# Patient Record
Sex: Male | Born: 1935 | Race: White | Hispanic: No | Smoking: Former smoker
Health system: Southern US, Community
[De-identification: ages and names within clinical notes are randomized; demographics above are authoritative.]

## PROBLEM LIST (undated history)

## (undated) DIAGNOSIS — F329 Major depressive disorder, single episode, unspecified: Secondary | ICD-10-CM

## (undated) DIAGNOSIS — I10 Essential (primary) hypertension: Secondary | ICD-10-CM

## (undated) DIAGNOSIS — J45909 Unspecified asthma, uncomplicated: Secondary | ICD-10-CM

## (undated) DIAGNOSIS — G4730 Sleep apnea, unspecified: Secondary | ICD-10-CM

## (undated) DIAGNOSIS — G8929 Other chronic pain: Secondary | ICD-10-CM

## (undated) DIAGNOSIS — Z9989 Dependence on other enabling machines and devices: Secondary | ICD-10-CM

## (undated) DIAGNOSIS — M1990 Unspecified osteoarthritis, unspecified site: Secondary | ICD-10-CM

## (undated) DIAGNOSIS — E785 Hyperlipidemia, unspecified: Secondary | ICD-10-CM

## (undated) DIAGNOSIS — C4490 Unspecified malignant neoplasm of skin, unspecified: Secondary | ICD-10-CM

## (undated) DIAGNOSIS — G4733 Obstructive sleep apnea (adult) (pediatric): Secondary | ICD-10-CM

## (undated) DIAGNOSIS — K219 Gastro-esophageal reflux disease without esophagitis: Secondary | ICD-10-CM

## (undated) DIAGNOSIS — E039 Hypothyroidism, unspecified: Secondary | ICD-10-CM

## (undated) DIAGNOSIS — F32A Depression, unspecified: Secondary | ICD-10-CM

## (undated) DIAGNOSIS — M549 Dorsalgia, unspecified: Secondary | ICD-10-CM

## (undated) DIAGNOSIS — J449 Chronic obstructive pulmonary disease, unspecified: Secondary | ICD-10-CM

## (undated) HISTORY — DX: Depression, unspecified: F32.A

## (undated) HISTORY — DX: Major depressive disorder, single episode, unspecified: F32.9

## (undated) HISTORY — DX: Hypothyroidism, unspecified: E03.9

## (undated) HISTORY — PX: PROSTATE SURGERY: SHX751

## (undated) HISTORY — DX: Chronic obstructive pulmonary disease, unspecified: J44.9

## (undated) HISTORY — DX: Unspecified malignant neoplasm of skin, unspecified: C44.90

## (undated) HISTORY — PX: OTHER SURGICAL HISTORY: SHX169

## (undated) HISTORY — PX: NECK SURGERY: SHX720

## (undated) HISTORY — DX: Hyperlipidemia, unspecified: E78.5

## (undated) HISTORY — DX: Unspecified osteoarthritis, unspecified site: M19.90

## (undated) HISTORY — DX: Sleep apnea, unspecified: G47.30

## (undated) HISTORY — DX: Gastro-esophageal reflux disease without esophagitis: K21.9

## (undated) HISTORY — DX: Essential (primary) hypertension: I10

---

## 2011-07-23 HISTORY — PX: SKIN CANCER EXCISION: SHX779

## 2013-07-29 DIAGNOSIS — Z6836 Body mass index (BMI) 36.0-36.9, adult: Secondary | ICD-10-CM | POA: Diagnosis not present

## 2013-07-29 DIAGNOSIS — G894 Chronic pain syndrome: Secondary | ICD-10-CM | POA: Diagnosis not present

## 2013-07-29 DIAGNOSIS — M961 Postlaminectomy syndrome, not elsewhere classified: Secondary | ICD-10-CM | POA: Diagnosis not present

## 2013-07-29 DIAGNOSIS — M159 Polyosteoarthritis, unspecified: Secondary | ICD-10-CM | POA: Diagnosis not present

## 2013-07-29 DIAGNOSIS — I10 Essential (primary) hypertension: Secondary | ICD-10-CM | POA: Diagnosis not present

## 2013-07-29 DIAGNOSIS — M5137 Other intervertebral disc degeneration, lumbosacral region: Secondary | ICD-10-CM | POA: Diagnosis not present

## 2013-07-29 DIAGNOSIS — M47817 Spondylosis without myelopathy or radiculopathy, lumbosacral region: Secondary | ICD-10-CM | POA: Diagnosis not present

## 2013-08-25 DIAGNOSIS — E785 Hyperlipidemia, unspecified: Secondary | ICD-10-CM | POA: Diagnosis not present

## 2013-08-25 DIAGNOSIS — I10 Essential (primary) hypertension: Secondary | ICD-10-CM | POA: Diagnosis not present

## 2013-08-25 DIAGNOSIS — R9431 Abnormal electrocardiogram [ECG] [EKG]: Secondary | ICD-10-CM | POA: Diagnosis not present

## 2013-08-26 DIAGNOSIS — G894 Chronic pain syndrome: Secondary | ICD-10-CM | POA: Diagnosis not present

## 2013-08-26 DIAGNOSIS — Z6836 Body mass index (BMI) 36.0-36.9, adult: Secondary | ICD-10-CM | POA: Diagnosis not present

## 2013-08-26 DIAGNOSIS — M47817 Spondylosis without myelopathy or radiculopathy, lumbosacral region: Secondary | ICD-10-CM | POA: Diagnosis not present

## 2013-08-26 DIAGNOSIS — M5137 Other intervertebral disc degeneration, lumbosacral region: Secondary | ICD-10-CM | POA: Diagnosis not present

## 2013-08-26 DIAGNOSIS — M961 Postlaminectomy syndrome, not elsewhere classified: Secondary | ICD-10-CM | POA: Diagnosis not present

## 2013-08-27 DIAGNOSIS — I10 Essential (primary) hypertension: Secondary | ICD-10-CM | POA: Diagnosis not present

## 2013-09-07 DIAGNOSIS — J0100 Acute maxillary sinusitis, unspecified: Secondary | ICD-10-CM | POA: Diagnosis not present

## 2013-09-15 DIAGNOSIS — L821 Other seborrheic keratosis: Secondary | ICD-10-CM | POA: Diagnosis not present

## 2013-09-15 DIAGNOSIS — L905 Scar conditions and fibrosis of skin: Secondary | ICD-10-CM | POA: Diagnosis not present

## 2013-09-15 DIAGNOSIS — D235 Other benign neoplasm of skin of trunk: Secondary | ICD-10-CM | POA: Diagnosis not present

## 2013-09-15 DIAGNOSIS — D1801 Hemangioma of skin and subcutaneous tissue: Secondary | ICD-10-CM | POA: Diagnosis not present

## 2013-09-15 DIAGNOSIS — L570 Actinic keratosis: Secondary | ICD-10-CM | POA: Diagnosis not present

## 2013-09-23 DIAGNOSIS — M47817 Spondylosis without myelopathy or radiculopathy, lumbosacral region: Secondary | ICD-10-CM | POA: Diagnosis not present

## 2013-09-23 DIAGNOSIS — M961 Postlaminectomy syndrome, not elsewhere classified: Secondary | ICD-10-CM | POA: Diagnosis not present

## 2013-09-23 DIAGNOSIS — I10 Essential (primary) hypertension: Secondary | ICD-10-CM | POA: Diagnosis not present

## 2013-09-23 DIAGNOSIS — G894 Chronic pain syndrome: Secondary | ICD-10-CM | POA: Diagnosis not present

## 2013-09-23 DIAGNOSIS — Z6836 Body mass index (BMI) 36.0-36.9, adult: Secondary | ICD-10-CM | POA: Diagnosis not present

## 2013-09-23 DIAGNOSIS — M159 Polyosteoarthritis, unspecified: Secondary | ICD-10-CM | POA: Diagnosis not present

## 2013-09-23 DIAGNOSIS — M5137 Other intervertebral disc degeneration, lumbosacral region: Secondary | ICD-10-CM | POA: Diagnosis not present

## 2013-09-27 DIAGNOSIS — J449 Chronic obstructive pulmonary disease, unspecified: Secondary | ICD-10-CM | POA: Diagnosis not present

## 2013-09-27 DIAGNOSIS — F3289 Other specified depressive episodes: Secondary | ICD-10-CM | POA: Diagnosis not present

## 2013-10-15 DIAGNOSIS — G4733 Obstructive sleep apnea (adult) (pediatric): Secondary | ICD-10-CM | POA: Diagnosis not present

## 2013-10-15 DIAGNOSIS — J449 Chronic obstructive pulmonary disease, unspecified: Secondary | ICD-10-CM | POA: Diagnosis not present

## 2013-10-15 DIAGNOSIS — J309 Allergic rhinitis, unspecified: Secondary | ICD-10-CM | POA: Diagnosis not present

## 2013-10-21 DIAGNOSIS — I10 Essential (primary) hypertension: Secondary | ICD-10-CM | POA: Diagnosis not present

## 2013-10-21 DIAGNOSIS — M961 Postlaminectomy syndrome, not elsewhere classified: Secondary | ICD-10-CM | POA: Diagnosis not present

## 2013-10-21 DIAGNOSIS — M159 Polyosteoarthritis, unspecified: Secondary | ICD-10-CM | POA: Diagnosis not present

## 2013-10-21 DIAGNOSIS — M5137 Other intervertebral disc degeneration, lumbosacral region: Secondary | ICD-10-CM | POA: Diagnosis not present

## 2013-10-21 DIAGNOSIS — G894 Chronic pain syndrome: Secondary | ICD-10-CM | POA: Diagnosis not present

## 2013-10-21 DIAGNOSIS — M47817 Spondylosis without myelopathy or radiculopathy, lumbosacral region: Secondary | ICD-10-CM | POA: Diagnosis not present

## 2013-11-18 DIAGNOSIS — M159 Polyosteoarthritis, unspecified: Secondary | ICD-10-CM | POA: Diagnosis not present

## 2013-11-18 DIAGNOSIS — M5137 Other intervertebral disc degeneration, lumbosacral region: Secondary | ICD-10-CM | POA: Diagnosis not present

## 2013-11-18 DIAGNOSIS — I10 Essential (primary) hypertension: Secondary | ICD-10-CM | POA: Diagnosis not present

## 2013-11-18 DIAGNOSIS — G894 Chronic pain syndrome: Secondary | ICD-10-CM | POA: Diagnosis not present

## 2013-11-18 DIAGNOSIS — M47817 Spondylosis without myelopathy or radiculopathy, lumbosacral region: Secondary | ICD-10-CM | POA: Diagnosis not present

## 2013-11-18 DIAGNOSIS — Z6836 Body mass index (BMI) 36.0-36.9, adult: Secondary | ICD-10-CM | POA: Diagnosis not present

## 2013-11-18 DIAGNOSIS — M961 Postlaminectomy syndrome, not elsewhere classified: Secondary | ICD-10-CM | POA: Diagnosis not present

## 2013-11-29 DIAGNOSIS — Z79899 Other long term (current) drug therapy: Secondary | ICD-10-CM | POA: Diagnosis not present

## 2013-11-29 DIAGNOSIS — I10 Essential (primary) hypertension: Secondary | ICD-10-CM | POA: Diagnosis not present

## 2013-12-10 DIAGNOSIS — J449 Chronic obstructive pulmonary disease, unspecified: Secondary | ICD-10-CM | POA: Diagnosis not present

## 2013-12-10 DIAGNOSIS — G4733 Obstructive sleep apnea (adult) (pediatric): Secondary | ICD-10-CM | POA: Diagnosis not present

## 2013-12-10 DIAGNOSIS — J309 Allergic rhinitis, unspecified: Secondary | ICD-10-CM | POA: Diagnosis not present

## 2013-12-17 DIAGNOSIS — Z6836 Body mass index (BMI) 36.0-36.9, adult: Secondary | ICD-10-CM | POA: Diagnosis not present

## 2013-12-17 DIAGNOSIS — M159 Polyosteoarthritis, unspecified: Secondary | ICD-10-CM | POA: Diagnosis not present

## 2013-12-17 DIAGNOSIS — M47817 Spondylosis without myelopathy or radiculopathy, lumbosacral region: Secondary | ICD-10-CM | POA: Diagnosis not present

## 2013-12-17 DIAGNOSIS — G894 Chronic pain syndrome: Secondary | ICD-10-CM | POA: Diagnosis not present

## 2013-12-17 DIAGNOSIS — M5137 Other intervertebral disc degeneration, lumbosacral region: Secondary | ICD-10-CM | POA: Diagnosis not present

## 2013-12-17 DIAGNOSIS — I10 Essential (primary) hypertension: Secondary | ICD-10-CM | POA: Diagnosis not present

## 2013-12-17 DIAGNOSIS — M961 Postlaminectomy syndrome, not elsewhere classified: Secondary | ICD-10-CM | POA: Diagnosis not present

## 2013-12-27 DIAGNOSIS — R011 Cardiac murmur, unspecified: Secondary | ICD-10-CM | POA: Diagnosis not present

## 2013-12-27 DIAGNOSIS — E785 Hyperlipidemia, unspecified: Secondary | ICD-10-CM | POA: Diagnosis not present

## 2013-12-27 DIAGNOSIS — I119 Hypertensive heart disease without heart failure: Secondary | ICD-10-CM | POA: Diagnosis not present

## 2013-12-27 DIAGNOSIS — I498 Other specified cardiac arrhythmias: Secondary | ICD-10-CM | POA: Diagnosis not present

## 2014-02-10 DIAGNOSIS — M47817 Spondylosis without myelopathy or radiculopathy, lumbosacral region: Secondary | ICD-10-CM | POA: Diagnosis not present

## 2014-02-10 DIAGNOSIS — Z6836 Body mass index (BMI) 36.0-36.9, adult: Secondary | ICD-10-CM | POA: Diagnosis not present

## 2014-02-10 DIAGNOSIS — I10 Essential (primary) hypertension: Secondary | ICD-10-CM | POA: Diagnosis not present

## 2014-02-10 DIAGNOSIS — G894 Chronic pain syndrome: Secondary | ICD-10-CM | POA: Diagnosis not present

## 2014-02-10 DIAGNOSIS — M961 Postlaminectomy syndrome, not elsewhere classified: Secondary | ICD-10-CM | POA: Diagnosis not present

## 2014-02-10 DIAGNOSIS — M159 Polyosteoarthritis, unspecified: Secondary | ICD-10-CM | POA: Diagnosis not present

## 2014-02-10 DIAGNOSIS — M5137 Other intervertebral disc degeneration, lumbosacral region: Secondary | ICD-10-CM | POA: Diagnosis not present

## 2014-02-15 DIAGNOSIS — R58 Hemorrhage, not elsewhere classified: Secondary | ICD-10-CM | POA: Diagnosis not present

## 2014-02-15 DIAGNOSIS — D689 Coagulation defect, unspecified: Secondary | ICD-10-CM | POA: Diagnosis not present

## 2014-02-23 DIAGNOSIS — I10 Essential (primary) hypertension: Secondary | ICD-10-CM | POA: Diagnosis not present

## 2014-03-10 DIAGNOSIS — Z6836 Body mass index (BMI) 36.0-36.9, adult: Secondary | ICD-10-CM | POA: Diagnosis not present

## 2014-03-10 DIAGNOSIS — M5137 Other intervertebral disc degeneration, lumbosacral region: Secondary | ICD-10-CM | POA: Diagnosis not present

## 2014-03-10 DIAGNOSIS — M47817 Spondylosis without myelopathy or radiculopathy, lumbosacral region: Secondary | ICD-10-CM | POA: Diagnosis not present

## 2014-03-10 DIAGNOSIS — G894 Chronic pain syndrome: Secondary | ICD-10-CM | POA: Diagnosis not present

## 2014-03-10 DIAGNOSIS — M961 Postlaminectomy syndrome, not elsewhere classified: Secondary | ICD-10-CM | POA: Diagnosis not present

## 2014-03-10 DIAGNOSIS — I10 Essential (primary) hypertension: Secondary | ICD-10-CM | POA: Diagnosis not present

## 2014-03-10 DIAGNOSIS — M159 Polyosteoarthritis, unspecified: Secondary | ICD-10-CM | POA: Diagnosis not present

## 2014-03-16 DIAGNOSIS — D235 Other benign neoplasm of skin of trunk: Secondary | ICD-10-CM | POA: Diagnosis not present

## 2014-03-16 DIAGNOSIS — L819 Disorder of pigmentation, unspecified: Secondary | ICD-10-CM | POA: Diagnosis not present

## 2014-03-16 DIAGNOSIS — L821 Other seborrheic keratosis: Secondary | ICD-10-CM | POA: Diagnosis not present

## 2014-03-16 DIAGNOSIS — D1801 Hemangioma of skin and subcutaneous tissue: Secondary | ICD-10-CM | POA: Diagnosis not present

## 2014-04-07 DIAGNOSIS — M961 Postlaminectomy syndrome, not elsewhere classified: Secondary | ICD-10-CM | POA: Diagnosis not present

## 2014-04-07 DIAGNOSIS — M5137 Other intervertebral disc degeneration, lumbosacral region: Secondary | ICD-10-CM | POA: Diagnosis not present

## 2014-04-07 DIAGNOSIS — M47817 Spondylosis without myelopathy or radiculopathy, lumbosacral region: Secondary | ICD-10-CM | POA: Diagnosis not present

## 2014-04-07 DIAGNOSIS — G894 Chronic pain syndrome: Secondary | ICD-10-CM | POA: Diagnosis not present

## 2014-04-07 DIAGNOSIS — M159 Polyosteoarthritis, unspecified: Secondary | ICD-10-CM | POA: Diagnosis not present

## 2014-04-07 DIAGNOSIS — Z6836 Body mass index (BMI) 36.0-36.9, adult: Secondary | ICD-10-CM | POA: Diagnosis not present

## 2014-04-08 DIAGNOSIS — J209 Acute bronchitis, unspecified: Secondary | ICD-10-CM | POA: Diagnosis not present

## 2014-04-28 ENCOUNTER — Ambulatory Visit: Payer: Self-pay | Admitting: Internal Medicine

## 2014-05-31 ENCOUNTER — Encounter: Payer: Self-pay | Admitting: Internal Medicine

## 2014-05-31 ENCOUNTER — Ambulatory Visit (INDEPENDENT_AMBULATORY_CARE_PROVIDER_SITE_OTHER): Payer: Medicare Other | Admitting: Internal Medicine

## 2014-05-31 VITALS — BP 128/68 | HR 57 | Ht 68.0 in | Wt 228.2 lb

## 2014-05-31 DIAGNOSIS — K219 Gastro-esophageal reflux disease without esophagitis: Secondary | ICD-10-CM | POA: Diagnosis not present

## 2014-05-31 DIAGNOSIS — M1990 Unspecified osteoarthritis, unspecified site: Secondary | ICD-10-CM

## 2014-05-31 DIAGNOSIS — J438 Other emphysema: Secondary | ICD-10-CM | POA: Diagnosis not present

## 2014-05-31 DIAGNOSIS — I10 Essential (primary) hypertension: Secondary | ICD-10-CM | POA: Diagnosis not present

## 2014-05-31 DIAGNOSIS — E785 Hyperlipidemia, unspecified: Secondary | ICD-10-CM

## 2014-05-31 DIAGNOSIS — G4730 Sleep apnea, unspecified: Secondary | ICD-10-CM | POA: Diagnosis not present

## 2014-05-31 NOTE — Patient Instructions (Signed)
Your physician wants you to follow-up in: 6 months with Dr. Hilty. You will receive a reminder letter in the mail two months in advance. If you don't receive a letter, please call our office to schedule the follow-up appointment.    

## 2014-06-01 ENCOUNTER — Institutional Professional Consult (permissible substitution): Payer: Self-pay | Admitting: Internal Medicine

## 2014-06-02 ENCOUNTER — Encounter: Payer: Self-pay | Admitting: Internal Medicine

## 2014-06-02 DIAGNOSIS — I10 Essential (primary) hypertension: Secondary | ICD-10-CM | POA: Insufficient documentation

## 2014-06-02 DIAGNOSIS — E785 Hyperlipidemia, unspecified: Secondary | ICD-10-CM | POA: Insufficient documentation

## 2014-06-02 DIAGNOSIS — G4733 Obstructive sleep apnea (adult) (pediatric): Secondary | ICD-10-CM | POA: Insufficient documentation

## 2014-06-02 DIAGNOSIS — Z9989 Dependence on other enabling machines and devices: Secondary | ICD-10-CM

## 2014-06-02 DIAGNOSIS — K219 Gastro-esophageal reflux disease without esophagitis: Secondary | ICD-10-CM | POA: Insufficient documentation

## 2014-06-02 DIAGNOSIS — M1990 Unspecified osteoarthritis, unspecified site: Secondary | ICD-10-CM | POA: Insufficient documentation

## 2014-06-02 DIAGNOSIS — R0609 Other forms of dyspnea: Secondary | ICD-10-CM

## 2014-06-02 NOTE — Progress Notes (Signed)
OFFICE NOTE  Chief Complaint:  Establish new cardiologist  Primary Care Physician: No PCP Per Patient  HPI:  Cristian Yang [facility] Is a pleasant 79 year old male who is here today accompanied by his wife. They both moved to [place] recently from [place]. He is previously followed by a cardiologist there and a pulmonologist. He does have a significant smoking history of about 20 years of cigar smoking. He has COPD and obstructive sleep apnea as well as arthritis and a history of skin cancer. He has no history of known heart disease. He reports that last year he underwent heart catheterization in fact on the same day as his wife when she was reporting chest pain symptoms. This demonstrated mild, nonobstructive coronary disease by his report. We are requesting those records now. Currently denies any chest pain and does have mild shortness of breath with exertion.  PMHx:  Past Medical History  Diagnosis Date  . COPD (chronic obstructive pulmonary disease)   . Sleep apnea   . GERD (gastroesophageal reflux disease)   . Hyperlipidemia   . Arthritis   . Skin cancer   . Hypertension     Past Surgical History  Procedure Laterality Date  . Skin cancer excision  2013    FAMHx:  Family History  Problem Relation Age of Onset  . Diabetes Mother   . Heart disease Mother   . Cancer Brother   . Bone cancer Sister     SOCHx:   reports that he quit smoking about 20 years ago. His smoking use included Cigarettes. He has a 60 pack-year smoking history. He has never used smokeless tobacco. He reports that he does not drink alcohol or use illicit drugs.  ALLERGIES:  Allergies  Allergen Reactions  . Ciprofloxacin   . Codeine     ROS: A comprehensive review of systems was negative except for: Respiratory: positive for dyspnea on exertion  HOME MEDS: Current Outpatient Prescriptions  Medication Sig Dispense Refill  . albuterol (PROAIR HFA) 108 (90 BASE) MCG/ACT inhaler  Inhale 1 puff into the lungs 2 (two) times daily.    Marland Kitchen albuterol (PROVENTIL) (2.5 MG/3ML) 0.083% nebulizer solution Take 2.5 mg by nebulization 2 (two) times daily.    . cetirizine (ZYRTEC) 10 MG tablet Take 10 mg by mouth daily.    . citalopram (CELEXA) 20 MG tablet Take 20 mg by mouth daily.    Marland Kitchen docusate sodium (COLACE) 100 MG capsule Take 100 mg by mouth daily as needed for mild constipation.    Marland Kitchen HYDROmorphone (DILAUDID) 2 MG tablet Take 2 mg by mouth 2 (two) times daily.    . mometasone (NASONEX) 50 MCG/ACT nasal spray Place 2 sprays into the nose daily.    . pravastatin (PRAVACHOL) 20 MG tablet Take 20 mg by mouth daily.    Marland Kitchen tiotropium (SPIRIVA) 18 MCG inhalation capsule Place 18 mcg into inhaler and inhale daily.    . valsartan (DIOVAN) 160 MG tablet Take 160 mg by mouth daily.     No current facility-administered medications for this visit.    LABS/IMAGING: No results found for this or any previous visit (from the past 48 hour(s)). No results found.  VITALS: BP 128/68 mmHg  Pulse 57  Ht 5\' 8"  (1.727 m)  Wt 228 lb 3.2 oz (103.511 kg)  BMI 34.71 kg/m2  EXAM: General appearance: alert and no distress Neck: no carotid bruit and no JVD Lungs: diminished breath sounds bilaterally Heart: regular rate and rhythm, S1, S2  normal, no murmur, click, rub or gallop Abdomen: soft, non-tender; bowel sounds normal; no masses,  no organomegaly Extremities: extremities normal, atraumatic, no cyanosis or edema Pulses: 2+ and symmetric Skin: Skin color, texture, turgor normal. No rashes or lesions Neurologic: Grossly normal Psych: Normal  EKG: Sinus bradycardia at 57, no ischemic changes  ASSESSMENT: 1. COPD 2. Hypertension-controlled 3. Dyslipidemia on pravastatin 4. Recent heart catheterization which did not demonstrate obstructive coronary disease  PLAN: 1.   Cristian Yang has a number of cardiac risk factors although his main complaints are shortness of breath related to COPD. He  is going to establish care with Cristian Yang in pulmonary.  His blood pressure is well controlled and his cholesterol is controlled on a statin. We will request recent laboratory work from his primary care provider as well as a copy of his cardiac catheterization from his previous cardiologist in [place]. For now I would recommend continue his current medications and I can see him back with his wife in 6 months.  Pixie Casino, MD, [facility] Attending Cardiologist [facility]  Virgina Deakins C 06/02/2014, 10:27 AM

## 2014-06-07 DIAGNOSIS — I10 Essential (primary) hypertension: Secondary | ICD-10-CM | POA: Diagnosis not present

## 2014-06-07 DIAGNOSIS — J309 Allergic rhinitis, unspecified: Secondary | ICD-10-CM | POA: Diagnosis not present

## 2014-06-07 DIAGNOSIS — F321 Major depressive disorder, single episode, moderate: Secondary | ICD-10-CM | POA: Diagnosis not present

## 2014-06-07 DIAGNOSIS — G8929 Other chronic pain: Secondary | ICD-10-CM | POA: Diagnosis not present

## 2014-06-07 DIAGNOSIS — J449 Chronic obstructive pulmonary disease, unspecified: Secondary | ICD-10-CM | POA: Diagnosis not present

## 2014-06-07 DIAGNOSIS — E785 Hyperlipidemia, unspecified: Secondary | ICD-10-CM | POA: Diagnosis not present

## 2014-06-08 ENCOUNTER — Ambulatory Visit (INDEPENDENT_AMBULATORY_CARE_PROVIDER_SITE_OTHER): Payer: Medicare Other | Admitting: Internal Medicine

## 2014-06-08 ENCOUNTER — Encounter: Payer: Self-pay | Admitting: Internal Medicine

## 2014-06-08 ENCOUNTER — Ambulatory Visit (INDEPENDENT_AMBULATORY_CARE_PROVIDER_SITE_OTHER)
Admission: RE | Admit: 2014-06-08 | Discharge: 2014-06-08 | Disposition: A | Discharge status: Home / Self Care | Payer: Medicare Other | Source: Ambulatory Visit | Attending: Internal Medicine | Admitting: Internal Medicine

## 2014-06-08 VITALS — BP 126/80 | HR 63 | Ht 68.0 in | Wt 233.0 lb

## 2014-06-08 DIAGNOSIS — G4730 Sleep apnea, unspecified: Secondary | ICD-10-CM

## 2014-06-08 DIAGNOSIS — J449 Chronic obstructive pulmonary disease, unspecified: Secondary | ICD-10-CM

## 2014-06-08 DIAGNOSIS — R0602 Shortness of breath: Secondary | ICD-10-CM | POA: Diagnosis not present

## 2014-06-08 NOTE — Progress Notes (Addendum)
   Subjective:    Patient ID: Cristian Yang [facility], male    DOB: [birth year],    MRN: [id number]  HPI  82 yowm quit smoking around 2000 with doe then around 170 lb but since then gradually gained up 258 with dx of osa and progressive doe and started on meds around 2010 with advair and and spiriva and moved to [facility] Nov 2015 and self referred to pulmonary clinic 06/08/2014     06/08/2014 1st [facility] office visit/ Aayat Hajjar  On spiriva/ advair  Chief Complaint  Patient presents with  . Pulmonary Consult    Self referral. Pt states dxed with SOB approx 10 yrs ago. He gets SOB with carrying in the groceries "if I get in a rush".      Prev had proair but mostly relying on neb alb - told to take it every am and then prn during the day Thinks spiriva works better than advair by trial and error Assoc with watery pnds   No obvious other patterns in day to day or daytime variabilty or assoc chronic cough or cp or chest tightness, subjective wheeze or  overt  hb symptoms. No unusual exp hx or h/o childhood pna/ asthma or knowledge of premature birth.  Sleeping ok without nocturnal  or early am exacerbation  of respiratory  c/o's or need for noct saba. Also denies any obvious fluctuation of symptoms with weather or environmental changes or other aggravating or alleviating factors except as outlined above   Current Medications, Allergies, Complete Past Medical History, Past Surgical History, Family History, and Social History were reviewed in [employer] record.             Review of Systems  Constitutional: Negative for fever, chills, activity change, appetite change and unexpected weight change.  HENT: Positive for postnasal drip. Negative for congestion, dental problem, rhinorrhea, sneezing, sore throat, trouble swallowing and voice change.   Eyes: Negative for visual disturbance.  Respiratory: Positive for shortness of breath. Negative for cough and choking.     Cardiovascular: Negative for chest pain and leg swelling.  Gastrointestinal: Negative for nausea, vomiting and abdominal pain.  Genitourinary: Negative for difficulty urinating.  Musculoskeletal: Negative for arthralgias.  Skin: Negative for rash.  Psychiatric/Behavioral: Negative for behavioral problems and confusion.       Objective:   Physical Exam   amb wm nad   Wt Readings from Last 3 Encounters:  06/08/14 233 lb (105.688 kg)  05/31/14 228 lb 3.2 oz (103.511 kg)    Vital signs reviewed   HEENT mild turbinate edema.  Oropharynx no thrush or excess pnd or cobblestoning.  No JVD or cervical adenopathy. Mild accessory muscle hypertrophy. Trachea midline, nl thryroid. Chest was hyperinflated by percussion with diminished breath sounds and moderate increased exp time without wheeze. Hoover sign positive at mid inspiration. Regular rate and rhythm without murmur gallop or rub or increase P2 or edema.  Abd: no hsm, nl excursion. Ext warm without cyanosis or clubbing.       CXR  06/08/2014 :  No active cardiopulmonary disease.        Assessment & Plan:

## 2014-06-08 NOTE — Patient Instructions (Addendum)
Call the customer service number re your cpap and they either need to help you set it up or send it back to them and we'll regroup with you at your next visit with Dr Halford Chessman  Plan A = Automatic = advair and spiriva each am and advair again in evening  Plan B= Backup Only use your albuterol (proair)  as a rescue medication to be used if you can't catch your breath by resting or doing a relaxed purse lip breathing pattern.  - The less you use it, the better it will work when you need it. - Ok to use up to  every 4 hours if you must but call for immediate appointment if use goes up over your usual need - Don't leave home without it !!  (think of it like the spare tire for your car)   Please remember to go to the  x-ray department downstairs for your tests - we will call you with the results when they are available.   We will schedule you for pfts' on return visit to see Dr Halford Chessman Dec 17 - I will let him decide whether to see you for sleep and lung problems or just sleep

## 2014-06-09 NOTE — Assessment & Plan Note (Signed)
Has new machine but never set up, off cpap x 3 m s hypersomnolence and note has lost wt since peak of 258 so probably needs new titration study anyway  Already has appt with Dr Halford Chessman and rec he keep it and follow previous pulmonary recs in meantime (has customer service number on his device he should contact for interim instructions or send the device back and quit paying rent on it)

## 2014-06-09 NOTE — Assessment & Plan Note (Signed)
His condition seems mild/ moderate and probably mostly complicated by sign wt gain p quit smoking but remains ? Neb dep in am  DDX of  difficult airways management all start with A and  include Adherence, Ace Inhibitors, Acid Reflux, Active Sinus Disease, Alpha 1 Antitripsin deficiency, Anxiety masquerading as Airways dz,  ABPA,  allergy(esp in young), Aspiration (esp in elderly), Adverse effects of DPI,  Active smokers, plus two Bs  = Bronchiectasis and Beta blocker use..and one C= CHF  Adherence is always the initial "prime suspect" and is a multilayered concern that requires a "trust but verify" approach in every patient - starting with knowing how to use medications, especially inhalers, correctly, keeping up with refills and understanding the fundamental difference between maintenance and prns vs those medications only taken for a very short course and then stopped and not refilled.  - The proper method of use, as well as anticipated side effects, of a metered-dose inhaler are discussed and demonstrated to the patient. Improved effectiveness after extensive coaching during this visit to a level of approximately  90% with dpi so continue this but if can't eliminate am neb then change advair to breo on return or change from spiriva /advair to anoro or stiolto since mostly has emphysematous phenotype by hx

## 2014-06-09 NOTE — Progress Notes (Signed)
Quick Note:  Spoke with pt and notified of results per Dr. Wert. Pt verbalized understanding and denied any questions.  ______ 

## 2014-06-10 ENCOUNTER — Institutional Professional Consult (permissible substitution): Payer: Self-pay | Admitting: Internal Medicine

## 2014-06-23 ENCOUNTER — Telehealth: Payer: Self-pay | Admitting: Internal Medicine

## 2014-06-23 NOTE — Telephone Encounter (Signed)
Received records from [place].  These records were requested by Dr Debara Pickett.  Records given to [place], [place] for Dr Debara Pickett. lp

## 2014-07-04 DIAGNOSIS — M545 Low back pain: Secondary | ICD-10-CM | POA: Diagnosis not present

## 2014-07-04 DIAGNOSIS — G8929 Other chronic pain: Secondary | ICD-10-CM | POA: Diagnosis not present

## 2014-07-04 DIAGNOSIS — M546 Pain in thoracic spine: Secondary | ICD-10-CM | POA: Diagnosis not present

## 2014-07-04 DIAGNOSIS — M25561 Pain in right knee: Secondary | ICD-10-CM | POA: Diagnosis not present

## 2014-07-04 DIAGNOSIS — M5441 Lumbago with sciatica, right side: Secondary | ICD-10-CM | POA: Diagnosis not present

## 2014-07-07 ENCOUNTER — Telehealth: Payer: Self-pay | Admitting: Pulmonary Disease

## 2014-07-07 ENCOUNTER — Institutional Professional Consult (permissible substitution): Payer: Self-pay | Admitting: Pulmonary Disease

## 2014-07-07 ENCOUNTER — Ambulatory Visit: Payer: Medicare Other | Attending: Anesthesiology

## 2014-07-07 DIAGNOSIS — M545 Low back pain, unspecified: Secondary | ICD-10-CM

## 2014-07-07 DIAGNOSIS — R531 Weakness: Secondary | ICD-10-CM | POA: Diagnosis not present

## 2014-07-07 DIAGNOSIS — M2560 Stiffness of unspecified joint, not elsewhere classified: Secondary | ICD-10-CM | POA: Diagnosis not present

## 2014-07-07 NOTE — Telephone Encounter (Signed)
Pt wants to switch providers from [facility] for pulmonary care.  Pt is requesting to see the same doctor as as his wife.  Pt has appt in Feb. 2016 to establish sleep care with Dr. Halford Chessman.  Dr. Halford Chessman and Dr Melvyn Novas please advise if you are ok with this switch for both husband and wife.  Thank you.

## 2014-07-07 NOTE — Telephone Encounter (Signed)
Okay with me also 

## 2014-07-07 NOTE — Therapy (Addendum)
Outpatient Rehabilitation [facility] [address] [place], [place], [address] Phone: [phone]   Fax:  [phone]  Physical Therapy Evaluation  Patient Details  Name: Rilen Shukla [facility] MRN: [id number] Date of Birth: 10-Dec-1934  Encounter Date: 07/07/2014      PT End of Session - 07/07/14 1352    Visit Number 1   Number of Visits 12   Date for PT Re-Evaluation 08/07/14   PT Start Time 1310   PT Stop Time 1400   PT Time Calculation (min) 50 min   Activity Tolerance Patient tolerated treatment well;Patient limited by pain   Behavior During Therapy [facility] for tasks assessed/performed      Past Medical History  Diagnosis Date  . COPD (chronic obstructive pulmonary disease)   . Sleep apnea   . GERD (gastroesophageal reflux disease)   . Hyperlipidemia   . Arthritis   . Skin cancer   . Hypertension     Past Surgical History  Procedure Laterality Date  . Skin cancer excision  2013    There were no vitals taken for this visit.  Visit Diagnosis:  Bilateral low back pain without sciatica - Plan: PT plan of care cert/re-cert  Stiffness of joints, multiple sites - Plan: PT plan of care cert/re-cert  Weakness - Plan: PT plan of care cert/re-cert      Subjective Assessment - 07/07/14 1315    Symptoms Lower back pain  worse RT more than LT. He leans to Lt for better releif.    He says he will do a HEP if issued   Pertinent History Pain onset without injury. He has seen pain management for 3 years. He has had PT in past. He reports MD in past suggested a fusion. PT declined   How long can you sit comfortably? 60 minor more   How long can you stand comfortably? Worse on hard surfaces. Also knee and ankle pain. 60 min max   How long can you walk comfortably? 5 minutes   Diagnostic tests MRI to be done in future   Patient Stated Goals None as MD wanted PT.           [facility] PT Assessment - 07/07/14 1340    Assessment   Medical Diagnosis lumbago   Onset Date  --  3-4 years ago   Next MD Visit 07/18/14   Precautions   Precautions None   Restrictions   Weight Bearing Restrictions No   Balance Screen   Has the patient fallen in the past 6 months Yes   How many times? 1  "blacked out"   Has the patient had a decrease in activity level because of a fear of falling?  No   Is the patient reluctant to leave their home because of a fear of falling?  No   Prior Function   Level of Independence Independent with basic ADLs  Assists with wifes care   AROM   Lumbar Flexion He can touch his knees  he can touch his knees   Lumbar Extension Decreased 80%   Lumbar - Right Side Bend decr 100%    Lumbar - Left Side Bend decr 80%   Lumbar - Right Rotation decr 75%   Lumbar - Left Rotation decr 75%   Strength   Overall Strength --  Both LE 4+/5 with pain all joints. Poor abdominals   Ambulation/Gait   Ambulation/Gait --  Independent with lateral shift to Lt and wider BOS.  PT Education - 07/07/14 1429    Education provided Yes   Education Details Discussed plan and likely hood of limited benefit but he wanted to try   Person(s) Educated Patient;Spouse   Methods Explanation   Comprehension Verbalized understanding            PT Long Term Goals - 07/07/14 1430    PT LONG TERM GOAL #1   Title He will be independent with HEP issued as of last visit   Time 4   Period Weeks   Status New   PT LONG TERM GOAL #2   Title He will report pain decreased 25% with normal home tasks   PT LONG TERM GOAL #3   Title He will understand posture and mechanics to ease stress to spine   Time 4   Period Weeks   Status New          Plan - 07/07/14 1352    Clinical Impression Statement Significant stiffens  in trunk and pan levels   Pt will benefit from skilled therapeutic intervention in order to improve on the following deficits Difficulty walking;Decreased range of motion;Decreased activity tolerance;Pain;Decreased strength   Rehab  Potential Fair   PT Frequency 2x / week   PT Duration 4 weeks  But if no better at 6 visists will discharge   PT Treatment/Interventions Moist Heat;Electrical Stimulation;Ultrasound;Patient/family education;Manual techniques;Dry needling;Functional mobility training;Therapeutic exercise;Passive range of motion   PT Next Visit Plan Start HEP, Modalities   PT Home Exercise Plan Trunk stretching   Recommended Other Services None   Consulted and Agree with Plan of Care Patient                              Problem List Patient Active Problem List   Diagnosis Date Noted  . COPD pfts pending  06/02/2014  . Sleep apnea 06/02/2014  . GERD (gastroesophageal reflux disease) 06/02/2014  . Hyperlipidemia 06/02/2014  . Arthritis 06/02/2014  . Hypertension 06/02/2014    Darrel Hoover PT 07/07/2014, 2:42 PM      PHYSICAL THERAPY DISCHARGE SUMMARY  Visits from Start of Care: Eval only  Current functional level related to goals / functional outcomes: Unknown. I thought he and his wife were to transfer to [facility] clinic but do not know if they did.   Remaining deficits: Unknown   Education / Equipment: None Plan:                                                    Patient goals were not met. Patient is being discharged due to not returning since the last visit.  ?????   Lillette Boxer Yaretzi Ernandez. PT          09/20/14   10:03 AM

## 2014-07-07 NOTE — Telephone Encounter (Signed)
LMTCB

## 2014-07-07 NOTE — Telephone Encounter (Signed)
Fine with me

## 2014-07-08 NOTE — Telephone Encounter (Signed)
Spoke with pt and notified this was okay per VS  Per Ashytn pt can keep appt for sleep cons that is already scheduled and will address pulm issues at that time

## 2014-07-25 DIAGNOSIS — M79609 Pain in unspecified limb: Secondary | ICD-10-CM | POA: Diagnosis not present

## 2014-07-25 DIAGNOSIS — M25519 Pain in unspecified shoulder: Secondary | ICD-10-CM | POA: Diagnosis not present

## 2014-07-25 DIAGNOSIS — G8929 Other chronic pain: Secondary | ICD-10-CM | POA: Diagnosis not present

## 2014-07-25 DIAGNOSIS — M4327 Fusion of spine, lumbosacral region: Secondary | ICD-10-CM | POA: Diagnosis not present

## 2014-07-25 DIAGNOSIS — M545 Low back pain: Secondary | ICD-10-CM | POA: Diagnosis not present

## 2014-08-05 DIAGNOSIS — J449 Chronic obstructive pulmonary disease, unspecified: Secondary | ICD-10-CM | POA: Diagnosis not present

## 2014-08-05 DIAGNOSIS — E039 Hypothyroidism, unspecified: Secondary | ICD-10-CM | POA: Diagnosis not present

## 2014-08-05 DIAGNOSIS — Z23 Encounter for immunization: Secondary | ICD-10-CM | POA: Diagnosis not present

## 2014-08-05 DIAGNOSIS — Z85820 Personal history of malignant melanoma of skin: Secondary | ICD-10-CM | POA: Diagnosis not present

## 2014-08-05 DIAGNOSIS — I10 Essential (primary) hypertension: Secondary | ICD-10-CM | POA: Diagnosis not present

## 2014-08-05 DIAGNOSIS — N400 Enlarged prostate without lower urinary tract symptoms: Secondary | ICD-10-CM | POA: Diagnosis not present

## 2014-08-05 DIAGNOSIS — G8929 Other chronic pain: Secondary | ICD-10-CM | POA: Diagnosis not present

## 2014-08-05 DIAGNOSIS — E785 Hyperlipidemia, unspecified: Secondary | ICD-10-CM | POA: Diagnosis not present

## 2014-08-05 DIAGNOSIS — F329 Major depressive disorder, single episode, unspecified: Secondary | ICD-10-CM | POA: Diagnosis not present

## 2014-08-19 ENCOUNTER — Ambulatory Visit: Payer: Medicare Other | Admitting: Urology

## 2014-08-24 ENCOUNTER — Institutional Professional Consult (permissible substitution): Payer: Medicare Other | Admitting: Pulmonary Disease

## 2014-08-25 DIAGNOSIS — M79604 Pain in right leg: Secondary | ICD-10-CM | POA: Diagnosis not present

## 2014-08-25 DIAGNOSIS — G8929 Other chronic pain: Secondary | ICD-10-CM | POA: Diagnosis not present

## 2014-08-25 DIAGNOSIS — M545 Low back pain: Secondary | ICD-10-CM | POA: Diagnosis not present

## 2014-08-25 DIAGNOSIS — M79605 Pain in left leg: Secondary | ICD-10-CM | POA: Diagnosis not present

## 2014-08-31 ENCOUNTER — Emergency Department (HOSPITAL_COMMUNITY): Payer: Medicare Other

## 2014-08-31 ENCOUNTER — Encounter (HOSPITAL_COMMUNITY): Payer: Self-pay

## 2014-08-31 ENCOUNTER — Emergency Department (HOSPITAL_COMMUNITY)
Admission: EM | Admit: 2014-08-31 | Discharge: 2014-08-31 | Disposition: A | Discharge status: Home / Self Care | Payer: Medicare Other | Attending: Emergency Medicine | Admitting: Emergency Medicine

## 2014-08-31 DIAGNOSIS — I10 Essential (primary) hypertension: Secondary | ICD-10-CM | POA: Insufficient documentation

## 2014-08-31 DIAGNOSIS — Z87891 Personal history of nicotine dependence: Secondary | ICD-10-CM | POA: Diagnosis not present

## 2014-08-31 DIAGNOSIS — Z8719 Personal history of other diseases of the digestive system: Secondary | ICD-10-CM | POA: Insufficient documentation

## 2014-08-31 DIAGNOSIS — Z85828 Personal history of other malignant neoplasm of skin: Secondary | ICD-10-CM | POA: Diagnosis not present

## 2014-08-31 DIAGNOSIS — Z7951 Long term (current) use of inhaled steroids: Secondary | ICD-10-CM | POA: Insufficient documentation

## 2014-08-31 DIAGNOSIS — E785 Hyperlipidemia, unspecified: Secondary | ICD-10-CM | POA: Diagnosis not present

## 2014-08-31 DIAGNOSIS — M25511 Pain in right shoulder: Secondary | ICD-10-CM | POA: Insufficient documentation

## 2014-08-31 DIAGNOSIS — Z79899 Other long term (current) drug therapy: Secondary | ICD-10-CM | POA: Diagnosis not present

## 2014-08-31 DIAGNOSIS — Z791 Long term (current) use of non-steroidal anti-inflammatories (NSAID): Secondary | ICD-10-CM | POA: Diagnosis not present

## 2014-08-31 DIAGNOSIS — M19011 Primary osteoarthritis, right shoulder: Secondary | ICD-10-CM | POA: Diagnosis not present

## 2014-08-31 DIAGNOSIS — J449 Chronic obstructive pulmonary disease, unspecified: Secondary | ICD-10-CM | POA: Insufficient documentation

## 2014-08-31 MED ORDER — NAPROXEN 500 MG PO TABS: 500.0000 mg | ORAL_TABLET | Freq: Two times a day (BID) | ORAL | Status: DC

## 2014-08-31 NOTE — ED Provider Notes (Signed)
Medical screening examination/treatment/procedure(s) were conducted as a shared visit with non-physician practitioner(s) and myself.  I personally evaluated the patient during the encounter.   EKG Interpretation None     No results found for this or any previous visit. Dg Shoulder Right  08/31/2014   CLINICAL DATA:  Right shoulder pain for 2 days.  No known injury.  EXAM: RIGHT SHOULDER - 2+ VIEW  COMPARISON:  None.  FINDINGS: There is no acute bony or joint abnormality. Moderate acromioclavicular osteoarthritis is noted. Visualized right lung and ribs are unremarkable.  IMPRESSION: No acute abnormality.  Moderate acromioclavicular osteoarthritis.   Electronically Signed   By: Inge Rise M.D.   On: 08/31/2014 14:15    Patient seen by me. Patient with complaint of right shoulder pain mostly posterior for 2 days. No specific injury. But he does frequently left a 3 wheel scooter in and out of the [place] for his wife. Patient also has an area of abrasion or maybe scratch marks on the posterior part of the shoulder that could be something he bumped up against. Patient with reasonable range of motion. Radial pulses 2+. There is some pain with range of motion. Possibility of rotator cuff injury or perhaps the arthritis that's evident on his x-ray could be playing a role. No bony injury. No evidence of any rash or anything consistent with shingles at this time.  Fredia Sorrow, MD 08/31/14 1438

## 2014-08-31 NOTE — Discharge Instructions (Signed)
Arthritis, Nonspecific °Arthritis is inflammation of a joint. This usually means pain, redness, warmth or swelling are present. One or more joints may be involved. There are a number of types of arthritis. Your caregiver may not be able to tell what type of arthritis you have right away. °CAUSES  °The most common cause of arthritis is the wear and tear on the joint (osteoarthritis). This causes damage to the cartilage, which can break down over time. The knees, hips, back and neck are most often affected by this type of arthritis. °Other types of arthritis and common causes of joint pain include: °· Sprains and other injuries near the joint. Sometimes minor sprains and injuries cause pain and swelling that develop hours later. °· Rheumatoid arthritis. This affects hands, feet and knees. It usually affects both sides of your body at the same time. It is often associated with chronic ailments, fever, weight loss and general weakness. °· Crystal arthritis. Gout and pseudo gout can cause occasional acute severe pain, redness and swelling in the foot, ankle, or knee. °· Infectious arthritis. Bacteria can get into a joint through a break in overlying skin. This can cause infection of the joint. Bacteria and viruses can also spread through the blood and affect your joints. °· Drug, infectious and allergy reactions. Sometimes joints can become mildly painful and slightly swollen with these types of illnesses. °SYMPTOMS  °· Pain is the main symptom. °· Your joint or joints can also be red, swollen and warm or hot to the touch. °· You may have a fever with certain types of arthritis, or even feel overall ill. °· The joint with arthritis will hurt with movement. Stiffness is present with some types of arthritis. °DIAGNOSIS  °Your caregiver will suspect arthritis based on your description of your symptoms and on your exam. Testing may be needed to find the type of arthritis: °· Blood and sometimes urine tests. °· X-ray tests  and sometimes CT or MRI scans. °· Removal of fluid from the joint (arthrocentesis) is done to check for bacteria, crystals or other causes. Your caregiver (or a specialist) will numb the area over the joint with a local anesthetic, and use a needle to remove joint fluid for examination. This procedure is only minimally uncomfortable. °· Even with these tests, your caregiver may not be able to tell what kind of arthritis you have. Consultation with a specialist (rheumatologist) may be helpful. °TREATMENT  °Your caregiver will discuss with you treatment specific to your type of arthritis. If the specific type cannot be determined, then the following general recommendations may apply. °Treatment of severe joint pain includes: °· Rest. °· Elevation. °· Anti-inflammatory medication (for example, ibuprofen) may be prescribed. Avoiding activities that cause increased pain. °· Only take over-the-counter or prescription medicines for pain and discomfort as recommended by your caregiver. °· Cold packs over an inflamed joint may be used for 10 to 15 minutes every hour. Hot packs sometimes feel better, but do not use overnight. Do not use hot packs if you are diabetic without your caregiver's permission. °· A cortisone shot into arthritic joints may help reduce pain and swelling. °· Any acute arthritis that gets worse over the next 1 to 2 days needs to be looked at to be sure there is no joint infection. °Long-term arthritis treatment involves modifying activities and lifestyle to reduce joint stress jarring. This can include weight loss. Also, exercise is needed to nourish the joint cartilage and remove waste. This helps keep the muscles   around the joint strong. °HOME CARE INSTRUCTIONS  °· Do not take aspirin to relieve pain if gout is suspected. This elevates uric acid levels. °· Only take over-the-counter or prescription medicines for pain, discomfort or fever as directed by your caregiver. °· Rest the joint as much as  possible. °· If your joint is swollen, keep it elevated. °· Use crutches if the painful joint is in your leg. °· Drinking plenty of fluids may help for certain types of arthritis. °· Follow your caregiver's dietary instructions. °· Try low-impact exercise such as: °¨ Swimming. °¨ Water aerobics. °¨ Biking. °¨ Walking. °· Morning stiffness is often relieved by a warm shower. °· Put your joints through regular range-of-motion. °SEEK MEDICAL CARE IF:  °· You do not feel better in 24 hours or are getting worse. °· You have side effects to medications, or are not getting better with treatment. °SEEK IMMEDIATE MEDICAL CARE IF:  °· You have a fever. °· You develop severe joint pain, swelling or redness. °· Many joints are involved and become painful and swollen. °· There is severe back pain and/or leg weakness. °· You have loss of bowel or bladder control. °Document Released: 08/15/2004 Document Revised: 09/30/2011 Document Reviewed: 08/31/2008 °Exit[employer]® Patient Information ©2015 [employer]. This information is not intended to replace advice given to you by your health care provider. Make sure you discuss any questions you have with your health care provider. ° °

## 2014-08-31 NOTE — ED Notes (Signed)
Patient c/o of new onset right shoulder pain times two days. Denies injury, no hx of shoulder problems. Patient SOB in triage, patient states he has chronic hx of SOB and this is "like always". RR 24 SPO2 96% RA.

## 2014-08-31 NOTE — ED Notes (Signed)
PA at bedside.

## 2014-09-08 NOTE — ED Provider Notes (Signed)
CSN: [id number]     Arrival date & time 08/31/14  1232 History   First MD Initiated Contact with Patient 08/31/14 1307     Chief Complaint  Patient presents with  . Shoulder Pain     (Consider location/radiation/quality/duration/timing/severity/associated sxs/prior Treatment) HPI  YONAS BUNDA is a 80 y.o. male who presents to the Emergency Department complaining of right shoulder pain for two days.  He describes an aching pain from his shoulder blade to the front.  He denies known injury, but does admit to having to lift his wife's motorized scooter in and out of their van.  He also reports having "scratches or bruises" to the back of his armpit but does not recall itching or injury.  He has tried OTC creams without relief.  He denies neck pain, numbness or weakness, chest pain, sweats, nausea or jaw pain.     Past Medical History  Diagnosis Date  . COPD (chronic obstructive pulmonary disease)   . Sleep apnea   . GERD (gastroesophageal reflux disease)   . Hyperlipidemia   . Arthritis   . Skin cancer   . Hypertension    Past Surgical History  Procedure Laterality Date  . Skin cancer excision  2013   Family History  Problem Relation Age of Onset  . Diabetes Mother   . Heart disease Mother   . Cancer Brother   . Bone cancer Sister    History  Substance Use Topics  . Smoking status: Former Smoker -- 2.00 packs/day for 30 years    Types: Cigarettes    Quit date: 05/31/1994  . Smokeless tobacco: Never Used  . Alcohol Use: No    Review of Systems  Constitutional: Negative for fever and chills.  Respiratory: Negative for chest tightness, shortness of breath and wheezing.   Cardiovascular: Negative for chest pain.  Gastrointestinal: Negative for nausea and vomiting.  Genitourinary: Negative for dysuria and difficulty urinating.  Musculoskeletal: Positive for arthralgias (right shoulder pain). Negative for joint swelling, neck pain and neck stiffness.  Skin: Negative for  color change and wound.  Neurological: Negative for dizziness, weakness, numbness and headaches.  All other systems reviewed and are negative.     Allergies  Ciprofloxacin and Codeine  Home Medications   Prior to Admission medications   Medication Sig Start Date End Date Taking? Authorizing Provider  albuterol (PROVENTIL) (2.5 MG/3ML) 0.083% nebulizer solution Take 2.5 mg by nebulization 2 (two) times daily as needed.     Historical Provider, MD  cetirizine (ZYRTEC) 10 MG tablet Take 10 mg by mouth daily.    Historical Provider, MD  citalopram (CELEXA) 20 MG tablet Take 20 mg by mouth daily.    Historical Provider, MD  docusate sodium (COLACE) 100 MG capsule Take 100 mg by mouth daily as needed for mild constipation.    Historical Provider, MD  Fluticasone-Salmeterol (ADVAIR) 250-50 MCG/DOSE AEPB Inhale 1 puff into the lungs 2 (two) times daily.    Historical Provider, MD  HYDROmorphone (DILAUDID) 2 MG tablet Take 2 mg by mouth 2 (two) times daily.    Historical Provider, MD  mometasone (NASONEX) 50 MCG/ACT nasal spray Place 2 sprays into the nose daily.    Historical Provider, MD  naproxen (NAPROSYN) 500 MG tablet Take 1 tablet (500 mg total) by mouth 2 (two) times daily with a meal. 08/31/14   Aloysious Vangieson L. Graycen Degan, PA-C  pravastatin (PRAVACHOL) 20 MG tablet Take 20 mg by mouth daily.    Historical Provider, MD  tiotropium (  SPIRIVA) 18 MCG inhalation capsule Place 18 mcg into inhaler and inhale daily.    Historical Provider, MD  valsartan (DIOVAN) 160 MG tablet Take 160 mg by mouth daily.    Historical Provider, MD   BP 156/73 mmHg  Pulse 61  Temp(Src) 98 F (36.7 C) (Oral)  Resp 16  Ht 5\' 8"  (1.727 m)  Wt 226 lb (102.513 kg)  BMI 34.37 kg/m2  SpO2 94% Physical Exam  Constitutional: He is oriented to person, place, and time. He appears well-developed and well-nourished. No distress.  HENT:  Head: Normocephalic and atraumatic.  Neck: Normal range of motion. Neck supple. No  thyromegaly present.  Cardiovascular: Normal rate, regular rhythm, normal heart sounds and intact distal pulses.   No murmur heard. Pulmonary/Chest: Effort normal and breath sounds normal. No respiratory distress. He exhibits no tenderness.  Musculoskeletal: He exhibits tenderness. He exhibits no edema.  Pt has mild tenderness with full abduction of the right shoulder, but otherwise has full ROM. Mild tenderness also present at [facility] joint. Radial pulse is brisk, distal sensation intact, CR< 2 sec. Grip strength is strong and symmetrical.   No edema , erythema or step-off deformity of the joint.   Lymphadenopathy:    He has no cervical adenopathy.  Neurological: He is alert and oriented to person, place, and time. He has normal strength. No sensory deficit. He exhibits normal muscle tone. Coordination normal.  Skin: Skin is warm and dry.  4-5 linear areas of ecchymosis to the posterior axilla.  No edema.  No rash  Nursing note and vitals reviewed.   ED Course  Procedures (including critical care time) Labs Review Labs Reviewed - No data to display  Imaging Review Dg Shoulder Right  08/31/2014   CLINICAL DATA:  Right shoulder pain for 2 days.  No known injury.  EXAM: RIGHT SHOULDER - 2+ VIEW  COMPARISON:  None.  FINDINGS: There is no acute bony or joint abnormality. Moderate acromioclavicular osteoarthritis is noted. Visualized right lung and ribs are unremarkable.  IMPRESSION: No acute abnormality.  Moderate acromioclavicular osteoarthritis.   Electronically Signed   By: Inge Rise M.D.   On: 08/31/2014 14:15      EKG Interpretation None      MDM   Final diagnoses:  Shoulder pain, acute, right    Pt is well appearing.  Vitals stable.  Pain to right shoulder without known injury.  Pain reproduced with movement.  No rash present to indicate shingles at this time.  Clinical suspicion for cardiac process is low.  Patient also seen and care discussed with Dr. Rogene Houston  Will treat  with NSAID, heat and he agrees to close PMD f/u or to return here if sx's worsen.     Omaya Nieland L. Vanessa [place], PA-C 09/08/14 1336

## 2014-09-09 ENCOUNTER — Institutional Professional Consult (permissible substitution): Payer: Medicare Other | Admitting: Internal Medicine

## 2014-09-16 ENCOUNTER — Telehealth: Payer: Self-pay | Admitting: Pulmonary Disease

## 2014-09-16 NOTE — Telephone Encounter (Signed)
Spoke with pt's wife.  Apparent there was power surge at their home, and since then her husband's CPAP is not working correctly.  She had called his DME, but missed their call back.  She was seen by Dr. Melvyn Novas previously, but requested to change providers.  He has several appointments that were cancelled due to his wife being in the hospital >> she has bad lung disease and is on oxygen 24/7.  He has appt scheduled with Dr. Gwenette Greet in March.  Advised her that I can't address mechanical problems with his CPAP and he will need to discuss with his DME to assess.

## 2014-09-22 DIAGNOSIS — M545 Low back pain: Secondary | ICD-10-CM | POA: Diagnosis not present

## 2014-09-22 DIAGNOSIS — G8929 Other chronic pain: Secondary | ICD-10-CM | POA: Diagnosis not present

## 2014-09-30 DIAGNOSIS — D1801 Hemangioma of skin and subcutaneous tissue: Secondary | ICD-10-CM | POA: Diagnosis not present

## 2014-09-30 DIAGNOSIS — Z85820 Personal history of malignant melanoma of skin: Secondary | ICD-10-CM | POA: Diagnosis not present

## 2014-09-30 DIAGNOSIS — L738 Other specified follicular disorders: Secondary | ICD-10-CM | POA: Diagnosis not present

## 2014-09-30 DIAGNOSIS — L820 Inflamed seborrheic keratosis: Secondary | ICD-10-CM | POA: Diagnosis not present

## 2014-09-30 DIAGNOSIS — L821 Other seborrheic keratosis: Secondary | ICD-10-CM | POA: Diagnosis not present

## 2014-10-10 ENCOUNTER — Encounter: Payer: Self-pay | Admitting: Pulmonary Disease

## 2014-10-10 ENCOUNTER — Ambulatory Visit (INDEPENDENT_AMBULATORY_CARE_PROVIDER_SITE_OTHER): Payer: Medicare Other | Admitting: Pulmonary Disease

## 2014-10-10 VITALS — BP 154/78 | HR 65 | Temp 97.8°F | Ht 68.0 in | Wt 230.4 lb

## 2014-10-10 DIAGNOSIS — G4733 Obstructive sleep apnea (adult) (pediatric): Secondary | ICD-10-CM | POA: Diagnosis not present

## 2014-10-10 NOTE — Patient Instructions (Signed)
Will get you referred to an equipment company here in town for new supplies, and also to increase pressure and download your machine Work on weight loss followup with me again in 85mos.

## 2014-10-10 NOTE — Assessment & Plan Note (Signed)
The patient has a history of mild sleep apnea dating back to 2004, and has been on C Pap since that time. He is set on a very low pressure, and he sometimes feels it is not adequate. Despite this, he feels that he clearly sleeps better with the C Pap device, and his wife sleeps better because it eliminates his snoring and restlessness. He feels that he is adequately rested in the mornings and has no issues with sleepiness during the day. At this point, I would like to get him referred to a medical equipment company here in town for new supplies, and would also like to get a download off his machine and try him on a little higher pressure.

## 2014-10-10 NOTE — Progress Notes (Signed)
Subjective:    Patient ID: Cristian Yang, male    DOB: [birth year], 80 y.o.   MRN: [id number]  HPI The patient is a 80 year old male who comes in today for establishing care for management of sleep apnea. He was diagnosed in [place] in 2004 with mild OSA, with an AHI of 12 events per hour. Dawn C at 4 cm of water, and feels that he is clearly slept better while wearing the device. Sometimes he feels the pressure is not adequate, but he has tolerated this over the years. He feels that he is rested in the mornings upon arising, and denies any sleepiness during the day. His weight has increased 10-15 pounds over the last few years, and his Epworth score today is 3. He has a brand-new C Pap device that is less than a year old, and currently uses a nasal mask. He has not been able to use a full face mask.   Sleep Questionnaire What time do you typically go to bed?( Between what hours) 11p 11p at 1459 on 10/10/14 by Inge Rise, CMA How long does it take you to fall asleep? 1/2 hrs 1/2 hrs at 1459 on 10/10/14 by Inge Rise, CMA How many times during the night do you wake up? 2 2 at 1459 on 10/10/14 by Inge Rise, [place] What time do you get out of bed to start your day? 0600 0600 at 1459 on 10/10/14 by Inge Rise, CMA Do you drive or operate heavy machinery in your occupation? No No at 1459 on 10/10/14 by Inge Rise, CMA How much has your weight changed (up or down) over the past two years? (In pounds) 10 lb (4.536 kg) 10 lb (4.536 kg) at 1459 on 10/10/14 by Inge Rise, CMA Have you ever had a sleep study before? Yes Yes at 1459 on 10/10/14 by Inge Rise, CMA If yes, [place] 1459 on 10/10/14 by Inge Rise, CMA If yes, date of study? 2014 2014 at 1459 on 10/10/14 by Inge Rise, CMA Do you currently use CPAP? Yes Yes at 1459 on 10/10/14 by Inge Rise, CMA If so, what pressure? not sure not sure at 1459 on 10/10/14 by Inge Rise, CMA Do you wear oxygen at any time? No No at 1459 on 10/10/14 by Inge Rise, CMA   Review of Systems  Constitutional: Negative for fever, chills, activity change, appetite change and unexpected weight change.  HENT: Negative for congestion, dental problem, postnasal drip, rhinorrhea, sneezing, sore throat, trouble swallowing and voice change.   Eyes: Negative for visual disturbance.  Respiratory: Negative for cough, choking and shortness of breath.   Cardiovascular: Negative for chest pain and leg swelling.  Gastrointestinal: Negative for nausea, vomiting and abdominal pain.  Genitourinary: Negative for difficulty urinating.  Musculoskeletal: Negative for arthralgias.  Skin: Negative for rash.  Psychiatric/Behavioral: Negative for behavioral problems and confusion.       Objective:   Physical Exam Constitutional:  Overweight male, no acute distress  HENT:  Nares patent without discharge  Oropharynx without exudate, palate and uvula are thick and elongated.  Eyes:  Perrla, eomi, no scleral icterus  Neck:  No JVD, no TMG  Cardiovascular:  Normal rate, regular rhythm, no rubs or gallops.  2/6 sem        Intact distal pulses  Pulmonary :  Normal breath sounds, no stridor or respiratory distress   No rales, rhonchi,  or wheezing  Abdominal:  Soft, nondistended, bowel sounds present.  No tenderness noted.   Musculoskeletal:  Minimal  lower extremity edema noted.  Lymph Nodes:  No cervical lymphadenopathy noted  Skin:  No cyanosis noted  Neurologic:  Alert, appropriate, moves all 4 extremities without obvious deficit.         Assessment & Plan:

## 2014-10-17 ENCOUNTER — Institutional Professional Consult (permissible substitution): Payer: Medicare Other | Admitting: Pulmonary Disease

## 2014-10-21 DIAGNOSIS — M546 Pain in thoracic spine: Secondary | ICD-10-CM | POA: Diagnosis not present

## 2014-10-21 DIAGNOSIS — G8929 Other chronic pain: Secondary | ICD-10-CM | POA: Diagnosis not present

## 2014-10-21 DIAGNOSIS — M545 Low back pain: Secondary | ICD-10-CM | POA: Diagnosis not present

## 2014-10-25 DIAGNOSIS — F4320 Adjustment disorder, unspecified: Secondary | ICD-10-CM | POA: Diagnosis not present

## 2014-11-03 ENCOUNTER — Ambulatory Visit: Payer: Medicare Other | Attending: Family Medicine | Admitting: Rehabilitative and Restorative Service Providers"

## 2014-11-03 DIAGNOSIS — R531 Weakness: Secondary | ICD-10-CM | POA: Insufficient documentation

## 2014-11-03 DIAGNOSIS — M2560 Stiffness of unspecified joint, not elsewhere classified: Secondary | ICD-10-CM | POA: Insufficient documentation

## 2014-11-03 DIAGNOSIS — M545 Low back pain: Secondary | ICD-10-CM | POA: Insufficient documentation

## 2014-11-03 DIAGNOSIS — J441 Chronic obstructive pulmonary disease with (acute) exacerbation: Secondary | ICD-10-CM

## 2014-11-03 NOTE — Therapy (Signed)
[facility] [address] [place], [place], [address] Phone: [phone]   Fax:  [phone]  Patient Details  Name: Cristian Yang MRN: [id number] Date of Birth: [birth year] Referring Provider:  London Pepper, MD  Encounter Date: 11/03/2014  The patient arrived today for a wheelchair evaluation due to COPD and general mobility issues (including chronic R ankle and chronic low back pain).  He reports he is seeking w/c mobility mainly for outdoor surfaces and for when he is fatigued.  At today's evaluation, he was able to stand and walk independently without an assistive device.  He denies falls in the past 12 months.  He ambulates >4 minutes nonstop (over 526ft) before O2 decreased from 96% to 88%.  He rested and it returned to 95% within <1 minute of resting.     PT and [employer] (w/c vendor from [facility]) explained to patient that [employer] covers w/c for use for household distances and household mobility.  At this time, he is independent for household mobility with occasional rest breaks when fatigued.    Therefore, the patient does not qualify for power mobility at this time due to independence with ambulation at the household level.     [place], [place] 11/03/2014, 11:21 AM  [facility] [address] [place], [place], [address] Phone: [phone]   Fax:  [phone]

## 2014-11-04 DIAGNOSIS — G8929 Other chronic pain: Secondary | ICD-10-CM | POA: Diagnosis not present

## 2014-11-04 DIAGNOSIS — M545 Low back pain: Secondary | ICD-10-CM | POA: Diagnosis not present

## 2014-11-08 ENCOUNTER — Ambulatory Visit (INDEPENDENT_AMBULATORY_CARE_PROVIDER_SITE_OTHER): Payer: Medicare Other | Admitting: Pulmonary Disease

## 2014-11-08 ENCOUNTER — Encounter: Payer: Self-pay | Admitting: Pulmonary Disease

## 2014-11-08 ENCOUNTER — Other Ambulatory Visit: Payer: Self-pay | Admitting: Pulmonary Disease

## 2014-11-08 DIAGNOSIS — R0609 Other forms of dyspnea: Principal | ICD-10-CM

## 2014-11-08 NOTE — Progress Notes (Signed)
   Subjective:    Patient ID: Cristian Yang, male    DOB: 11/25/1934, 80 y.o.   MRN: [id number]  HPI The patient is a 80 year old male who I have been asked to see for management of COPD. The patient tells me that he was diagnosed in [place] with COPD, and has had PFTs in the past. He is currently on Advair and Spiriva, and feels that he is doing very well on these medications. He describes a dyspnea on exertion at one block at a moderate pace on flat ground, and will also get winded walking up a flight of stairs or bringing groceries in from the car. Despite this, he feels that his breathing is at his usual baseline, and has not had a recent acute exacerbation. He denies any chronic cough or mucus production. He did attend pulmonary rehabilitation while in [place], and did not feel that it helped him. He has had a recent chest x-ray in November of last year that showed no acute process. We also have an old echo on him from 2008 that showed an ejection fraction of 50%, global mild hypokinesis, and mild left ventricular dilatation.   Review of Systems  Constitutional: Negative for fever and unexpected weight change.  HENT: Positive for congestion and ear pain. Negative for dental problem, nosebleeds, postnasal drip, rhinorrhea, sinus pressure, sneezing, sore throat and trouble swallowing.   Eyes: Negative for redness and itching.  Respiratory: Positive for cough, shortness of breath and wheezing. Negative for chest tightness.   Cardiovascular: Negative for palpitations and leg swelling.  Gastrointestinal: Negative for nausea and vomiting.  Genitourinary: Negative for dysuria.  Musculoskeletal: Negative for joint swelling.  Skin: Negative for rash.  Neurological: Positive for headaches.  Hematological: Does not bruise/bleed easily.  Psychiatric/Behavioral: Positive for dysphoric mood. The patient is nervous/anxious.        Objective:   Physical Exam Constitutional:  Obese male, no acute  distress  HENT:  Nares patent without discharge  Oropharynx without exudate, palate and uvula are elongated  Eyes:  Perrla, eomi, no scleral icterus  Neck:  No JVD, no TMG  Cardiovascular:  Normal rate, regular rhythm, no rubs or gallops.  3/6 sem        Intact distal pulses  Pulmonary :  decreased breath sounds, no stridor or respiratory distress   No rales, rhonchi, or wheezing  Abdominal:  Soft, nondistended, bowel sounds present.  No tenderness noted.   Musculoskeletal:  No lower extremity edema noted.  Lymph Nodes:  No cervical lymphadenopathy noted  Skin:  No cyanosis noted  Neurologic:  Alert, appropriate, moves all 4 extremities without obvious deficit.         Assessment & Plan:

## 2014-11-08 NOTE — Patient Instructions (Signed)
Stay on your advair and spiriva as you are doing Work on weight loss and conditioning. If you would like to go to pulmonary rehab again, let us know. Will schedule for breathing studies, and call you with results.  Will see you again in Sept.  I think apptm is already scheduled.

## 2014-11-08 NOTE — Assessment & Plan Note (Addendum)
The patient has significant dyspnea on exertion, and carries a diagnosis of COPD from [place]. He is currently on Advair and Spiriva, and feels that he is doing very well on these medications. He feels that his breathing is at his usual baseline, and he continues to be quite functional. He has not had any recent acute exacerbation. At this point, I have asked him to continue on his current regimen, and we'll get him scheduled for pulmonary function studies to document airflow obstruction. I also encouraged him to work aggressively on weight loss and conditioning, and offered to refer him to pulmonary rehabilitation. He has told me that he participated in rehabilitation while in [place], and did not feel that it helped him.

## 2014-11-10 ENCOUNTER — Telehealth: Payer: Self-pay | Admitting: Pulmonary Disease

## 2014-11-10 DIAGNOSIS — G4733 Obstructive sleep apnea (adult) (pediatric): Secondary | ICD-10-CM

## 2014-11-10 NOTE — Telephone Encounter (Signed)
No records showing that patient uses [facility], according to our chart patient is using [facility] for his CPAP supplies.  Called patient to confirm that he uses [facility] before calling them with information on this patient.  Awaiting call back from patient.

## 2014-11-10 NOTE — Telephone Encounter (Signed)
Spoke with patient wife, states that they no longer get their supplies through [facility] and now get them through [facility]. I have called [facility] and spoke with Deneen- made aware that the patient is no longer going to be getting supplies through them.  She is going to contact the patient to verify this before cancelling their account.  ---- Pt wife states that the patient is still having a lot of snoring at night is still sleeping with his mouth open. Wife states that she is losing a lot of sleep because he is sleeping so loudly. Wife states that they discussed him maybe needing a full face mask to help keep his mouth closed or to fit him better. Wife states that the patient did not tolerate the chin strap.   Please advise Dr Gwenette Greet. Thanks.

## 2014-11-11 ENCOUNTER — Telehealth (HOSPITAL_COMMUNITY): Payer: Self-pay

## 2014-11-11 NOTE — Telephone Encounter (Signed)
Pt aware order was sent

## 2014-11-11 NOTE — Telephone Encounter (Addendum)
ATC multiple times, busy signal wcb Order sent to DME for full face mask

## 2014-11-11 NOTE — Telephone Encounter (Signed)
Called patient regarding entrance to Pulmonary Rehab. Spoke with wife. Patient is interested in program but lives in [place] and requested referral be sent to [facility]. Will send order over via fax.

## 2014-11-11 NOTE — Telephone Encounter (Signed)
He needs to try a full face mask

## 2014-11-24 ENCOUNTER — Encounter (HOSPITAL_COMMUNITY): Payer: Medicare Other | Attending: Pulmonary Disease

## 2014-11-30 DIAGNOSIS — G8929 Other chronic pain: Secondary | ICD-10-CM | POA: Diagnosis not present

## 2014-11-30 DIAGNOSIS — J449 Chronic obstructive pulmonary disease, unspecified: Secondary | ICD-10-CM | POA: Diagnosis not present

## 2014-11-30 DIAGNOSIS — E039 Hypothyroidism, unspecified: Secondary | ICD-10-CM | POA: Diagnosis not present

## 2014-11-30 DIAGNOSIS — I10 Essential (primary) hypertension: Secondary | ICD-10-CM | POA: Diagnosis not present

## 2014-11-30 DIAGNOSIS — F329 Major depressive disorder, single episode, unspecified: Secondary | ICD-10-CM | POA: Diagnosis not present

## 2014-11-30 DIAGNOSIS — E785 Hyperlipidemia, unspecified: Secondary | ICD-10-CM | POA: Diagnosis not present

## 2014-12-01 ENCOUNTER — Encounter (HOSPITAL_COMMUNITY): Payer: Medicare Other

## 2014-12-02 DIAGNOSIS — Z79891 Long term (current) use of opiate analgesic: Secondary | ICD-10-CM | POA: Diagnosis not present

## 2014-12-02 DIAGNOSIS — M545 Low back pain: Secondary | ICD-10-CM | POA: Diagnosis not present

## 2014-12-02 DIAGNOSIS — M546 Pain in thoracic spine: Secondary | ICD-10-CM | POA: Diagnosis not present

## 2014-12-02 DIAGNOSIS — G8929 Other chronic pain: Secondary | ICD-10-CM | POA: Diagnosis not present

## 2014-12-08 ENCOUNTER — Encounter (HOSPITAL_COMMUNITY): Admission: RE | Admit: 2014-12-08 | Payer: Medicare Other | Source: Ambulatory Visit

## 2014-12-12 ENCOUNTER — Ambulatory Visit (INDEPENDENT_AMBULATORY_CARE_PROVIDER_SITE_OTHER): Payer: Medicare Other | Admitting: Pulmonary Disease

## 2014-12-12 DIAGNOSIS — R0609 Other forms of dyspnea: Secondary | ICD-10-CM

## 2014-12-12 LAB — PULMONARY FUNCTION TEST
DL/VA % PRED: 86 %
DL/VA: 3.86 ml/min/mmHg/L
DLCO UNC % PRED: 47 %
DLCO UNC: 14.01 ml/min/mmHg
FEF 25-75 PRE: 0.83 L/s
FEF 25-75 Post: 0.67 L/sec
FEF2575-%CHANGE-POST: -18 %
FEF2575-%PRED-PRE: 44 %
FEF2575-%Pred-Post: 35 %
FEV1-%Change-Post: -4 %
FEV1-%PRED-POST: 57 %
FEV1-%PRED-PRE: 60 %
FEV1-Post: 1.54 L
FEV1-Pre: 1.61 L
FEV1FVC-%CHANGE-POST: 5 %
FEV1FVC-%Pred-Pre: 92 %
FEV6-%CHANGE-POST: -8 %
FEV6-%PRED-PRE: 67 %
FEV6-%Pred-Post: 62 %
FEV6-POST: 2.18 L
FEV6-PRE: 2.38 L
FEV6FVC-%CHANGE-POST: 1 %
FEV6FVC-%Pred-Post: 107 %
FEV6FVC-%Pred-Pre: 105 %
FVC-%CHANGE-POST: -9 %
FVC-%Pred-Post: 58 %
FVC-%Pred-Pre: 64 %
FVC-Post: 2.19 L
FVC-Pre: 2.42 L
PRE FEV6/FVC RATIO: 98 %
Post FEV1/FVC ratio: 70 %
Post FEV6/FVC ratio: 100 %
Pre FEV1/FVC ratio: 66 %
RV % pred: 87 %
RV: 2.2 L
TLC % pred: 82 %
TLC: 5.48 L

## 2014-12-12 NOTE — Progress Notes (Signed)
PFT done today. 

## 2014-12-22 ENCOUNTER — Telehealth: Payer: Self-pay | Admitting: Pulmonary Disease

## 2014-12-22 ENCOUNTER — Encounter (HOSPITAL_COMMUNITY): Payer: Medicare Other

## 2014-12-22 NOTE — Telephone Encounter (Signed)
PFT done 12/12/14. Requesting results. Please advise [place] thanks

## 2014-12-23 NOTE — Telephone Encounter (Signed)
Result Note     Let pt know that he has mild to moderate copd by his breathing studies. He is on good medications for this degree of copd, and should continue  --  I spoke with patient about results and he verbalized understanding and had no questions

## 2015-01-02 DIAGNOSIS — M79661 Pain in right lower leg: Secondary | ICD-10-CM | POA: Diagnosis not present

## 2015-01-02 DIAGNOSIS — M79605 Pain in left leg: Secondary | ICD-10-CM | POA: Diagnosis not present

## 2015-01-02 DIAGNOSIS — M79604 Pain in right leg: Secondary | ICD-10-CM | POA: Diagnosis not present

## 2015-01-02 DIAGNOSIS — Z79891 Long term (current) use of opiate analgesic: Secondary | ICD-10-CM | POA: Diagnosis not present

## 2015-01-02 DIAGNOSIS — G8929 Other chronic pain: Secondary | ICD-10-CM | POA: Diagnosis not present

## 2015-01-02 DIAGNOSIS — M545 Low back pain: Secondary | ICD-10-CM | POA: Diagnosis not present

## 2015-01-02 DIAGNOSIS — M546 Pain in thoracic spine: Secondary | ICD-10-CM | POA: Diagnosis not present

## 2015-01-03 ENCOUNTER — Encounter (HOSPITAL_COMMUNITY): Payer: Medicare Other

## 2015-01-03 DIAGNOSIS — D485 Neoplasm of uncertain behavior of skin: Secondary | ICD-10-CM | POA: Diagnosis not present

## 2015-01-03 DIAGNOSIS — D2262 Melanocytic nevi of left upper limb, including shoulder: Secondary | ICD-10-CM | POA: Diagnosis not present

## 2015-01-03 DIAGNOSIS — Z85820 Personal history of malignant melanoma of skin: Secondary | ICD-10-CM | POA: Diagnosis not present

## 2015-01-03 DIAGNOSIS — Z85828 Personal history of other malignant neoplasm of skin: Secondary | ICD-10-CM | POA: Diagnosis not present

## 2015-01-03 DIAGNOSIS — L821 Other seborrheic keratosis: Secondary | ICD-10-CM | POA: Diagnosis not present

## 2015-01-03 DIAGNOSIS — D1801 Hemangioma of skin and subcutaneous tissue: Secondary | ICD-10-CM | POA: Diagnosis not present

## 2015-01-06 ENCOUNTER — Ambulatory Visit: Payer: Medicare Other | Admitting: Urology

## 2015-01-30 DIAGNOSIS — M545 Low back pain: Secondary | ICD-10-CM | POA: Diagnosis not present

## 2015-01-30 DIAGNOSIS — G8929 Other chronic pain: Secondary | ICD-10-CM | POA: Diagnosis not present

## 2015-01-30 DIAGNOSIS — Z79891 Long term (current) use of opiate analgesic: Secondary | ICD-10-CM | POA: Diagnosis not present

## 2015-01-30 DIAGNOSIS — M546 Pain in thoracic spine: Secondary | ICD-10-CM | POA: Diagnosis not present

## 2015-02-01 DIAGNOSIS — E039 Hypothyroidism, unspecified: Secondary | ICD-10-CM | POA: Diagnosis not present

## 2015-02-03 DIAGNOSIS — F4323 Adjustment disorder with mixed anxiety and depressed mood: Secondary | ICD-10-CM | POA: Diagnosis not present

## 2015-02-14 ENCOUNTER — Ambulatory Visit (INDEPENDENT_AMBULATORY_CARE_PROVIDER_SITE_OTHER): Payer: Medicare Other | Admitting: Urology

## 2015-02-14 DIAGNOSIS — N401 Enlarged prostate with lower urinary tract symptoms: Secondary | ICD-10-CM

## 2015-02-27 DIAGNOSIS — M545 Low back pain: Secondary | ICD-10-CM | POA: Diagnosis not present

## 2015-02-27 DIAGNOSIS — M546 Pain in thoracic spine: Secondary | ICD-10-CM | POA: Diagnosis not present

## 2015-02-27 DIAGNOSIS — M79604 Pain in right leg: Secondary | ICD-10-CM | POA: Diagnosis not present

## 2015-02-27 DIAGNOSIS — M1731 Unilateral post-traumatic osteoarthritis, right knee: Secondary | ICD-10-CM | POA: Diagnosis not present

## 2015-02-27 DIAGNOSIS — G541 Lumbosacral plexus disorders: Secondary | ICD-10-CM | POA: Diagnosis not present

## 2015-02-27 DIAGNOSIS — M5400 Panniculitis affecting regions of neck and back, site unspecified: Secondary | ICD-10-CM | POA: Diagnosis not present

## 2015-02-27 DIAGNOSIS — G8929 Other chronic pain: Secondary | ICD-10-CM | POA: Diagnosis not present

## 2015-02-27 DIAGNOSIS — Z79891 Long term (current) use of opiate analgesic: Secondary | ICD-10-CM | POA: Diagnosis not present

## 2015-02-27 DIAGNOSIS — G603 Idiopathic progressive neuropathy: Secondary | ICD-10-CM | POA: Diagnosis not present

## 2015-02-27 DIAGNOSIS — M79605 Pain in left leg: Secondary | ICD-10-CM | POA: Diagnosis not present

## 2015-03-06 DIAGNOSIS — Z85820 Personal history of malignant melanoma of skin: Secondary | ICD-10-CM | POA: Diagnosis not present

## 2015-03-06 DIAGNOSIS — L859 Epidermal thickening, unspecified: Secondary | ICD-10-CM | POA: Diagnosis not present

## 2015-03-06 DIAGNOSIS — L570 Actinic keratosis: Secondary | ICD-10-CM | POA: Diagnosis not present

## 2015-03-23 DIAGNOSIS — M79661 Pain in right lower leg: Secondary | ICD-10-CM | POA: Diagnosis not present

## 2015-03-23 DIAGNOSIS — M545 Low back pain: Secondary | ICD-10-CM | POA: Diagnosis not present

## 2015-03-23 DIAGNOSIS — M79605 Pain in left leg: Secondary | ICD-10-CM | POA: Diagnosis not present

## 2015-03-23 DIAGNOSIS — M546 Pain in thoracic spine: Secondary | ICD-10-CM | POA: Diagnosis not present

## 2015-03-23 DIAGNOSIS — G8929 Other chronic pain: Secondary | ICD-10-CM | POA: Diagnosis not present

## 2015-03-23 DIAGNOSIS — Z79891 Long term (current) use of opiate analgesic: Secondary | ICD-10-CM | POA: Diagnosis not present

## 2015-03-23 DIAGNOSIS — M79604 Pain in right leg: Secondary | ICD-10-CM | POA: Diagnosis not present

## 2015-03-23 DIAGNOSIS — M79662 Pain in left lower leg: Secondary | ICD-10-CM | POA: Diagnosis not present

## 2015-03-29 DIAGNOSIS — Z23 Encounter for immunization: Secondary | ICD-10-CM | POA: Diagnosis not present

## 2015-03-29 DIAGNOSIS — E039 Hypothyroidism, unspecified: Secondary | ICD-10-CM | POA: Diagnosis not present

## 2015-03-29 DIAGNOSIS — I10 Essential (primary) hypertension: Secondary | ICD-10-CM | POA: Diagnosis not present

## 2015-03-29 DIAGNOSIS — K5900 Constipation, unspecified: Secondary | ICD-10-CM | POA: Diagnosis not present

## 2015-03-29 DIAGNOSIS — E785 Hyperlipidemia, unspecified: Secondary | ICD-10-CM | POA: Diagnosis not present

## 2015-03-29 DIAGNOSIS — M2550 Pain in unspecified joint: Secondary | ICD-10-CM | POA: Diagnosis not present

## 2015-03-29 DIAGNOSIS — R252 Cramp and spasm: Secondary | ICD-10-CM | POA: Diagnosis not present

## 2015-04-11 ENCOUNTER — Encounter: Payer: Self-pay | Admitting: Pulmonary Disease

## 2015-04-11 ENCOUNTER — Ambulatory Visit (INDEPENDENT_AMBULATORY_CARE_PROVIDER_SITE_OTHER): Payer: Medicare Other | Admitting: Pulmonary Disease

## 2015-04-11 VITALS — BP 120/82 | HR 72 | Temp 97.5°F | Ht 68.0 in | Wt 236.8 lb

## 2015-04-11 DIAGNOSIS — J449 Chronic obstructive pulmonary disease, unspecified: Secondary | ICD-10-CM

## 2015-04-11 DIAGNOSIS — R05 Cough: Secondary | ICD-10-CM

## 2015-04-11 DIAGNOSIS — J439 Emphysema, unspecified: Secondary | ICD-10-CM | POA: Diagnosis not present

## 2015-04-11 DIAGNOSIS — R058 Other specified cough: Secondary | ICD-10-CM

## 2015-04-11 DIAGNOSIS — J4489 Other specified chronic obstructive pulmonary disease: Secondary | ICD-10-CM

## 2015-04-11 DIAGNOSIS — G4733 Obstructive sleep apnea (adult) (pediatric): Secondary | ICD-10-CM | POA: Diagnosis not present

## 2015-04-11 NOTE — Progress Notes (Signed)
Chief Complaint  Patient presents with  . Follow-up    former [place] pt - Dyspnea doing well.  Nonprod cough and hoarseness x 1 wk.  Not wearing cpap x 4 days d/t sinus symptoms.  Typically, wears cpap nightly with no problems with mask or pressure.    History of Present Illness: Cristian Yang is a 80 y.o. male former smoker with COPD/emphysema and OSA.   He was previously followed by Dr. Gwenette Greet.  He uses albuterol once or twice per day >> this helps.  He gets occasional cough and sinus congestion.  He denies fever, hemoptysis, chest pain, or wheezing.  He uses his CPAP w/o difficulty.  He has full face mask.  He feels rested from his sleep.  He already got his flu shot this year.   TESTS: PSG 09/29/02 >> RDI 12, SaO2 low 85% PFT 12/12/14 >> FEV1 1.61 (60%), FEV1% 66, TLC 5.48 (82%), DLCO 47%, no BD   PMhx >> GERD, HLD, HTN, Hypothyroidism, Depression  Past surgical hx, Medications, Allergies, Family hx, Social hx all reviewed.   Physical Exam: BP 120/82 mmHg  Pulse 72  Temp(Src) 97.5 F (36.4 C) (Oral)  Ht 5\' 8"  (1.727 m)  Wt 236 lb 12.8 oz (107.412 kg)  BMI 36.01 kg/m2  SpO2 94%  General - No distress ENT - No sinus tenderness, no oral exudate, no LAN Cardiac - s1s2 regular, no murmur Chest - No wheeze/rales/dullness Back - No focal tenderness Abd - Soft, non-tender Ext - No edema Neuro - Normal strength Skin - No rashes Psych - normal mood, and behavior   Assessment/Plan:  COPD with emphysema. Plan: - continue spiriva, advair, and prn albuterol - will discuss option of pulmonary rehab at his next visit  Obstructive sleep apnea. He reports compliance with CPAP and benefit from therapy. Plan: - continue CPAP qhs  Upper airway cough with post-nasal drip. Plan: - continue nasonex and zyrtec   Chesley Mires, MD [facility] Pager:  [phone]

## 2015-04-11 NOTE — Patient Instructions (Signed)
Follow-up in 6 weeks

## 2015-04-12 ENCOUNTER — Encounter: Payer: Self-pay | Admitting: Pulmonary Disease

## 2015-04-12 DIAGNOSIS — R058 Other specified cough: Secondary | ICD-10-CM | POA: Insufficient documentation

## 2015-04-12 DIAGNOSIS — R05 Cough: Secondary | ICD-10-CM | POA: Insufficient documentation

## 2015-04-12 DIAGNOSIS — J449 Chronic obstructive pulmonary disease, unspecified: Secondary | ICD-10-CM | POA: Insufficient documentation

## 2015-04-24 DIAGNOSIS — M545 Low back pain: Secondary | ICD-10-CM | POA: Diagnosis not present

## 2015-04-24 DIAGNOSIS — M79605 Pain in left leg: Secondary | ICD-10-CM | POA: Diagnosis not present

## 2015-04-24 DIAGNOSIS — M79662 Pain in left lower leg: Secondary | ICD-10-CM | POA: Diagnosis not present

## 2015-04-24 DIAGNOSIS — Z79891 Long term (current) use of opiate analgesic: Secondary | ICD-10-CM | POA: Diagnosis not present

## 2015-04-24 DIAGNOSIS — M79661 Pain in right lower leg: Secondary | ICD-10-CM | POA: Diagnosis not present

## 2015-04-24 DIAGNOSIS — G8929 Other chronic pain: Secondary | ICD-10-CM | POA: Diagnosis not present

## 2015-04-24 DIAGNOSIS — M546 Pain in thoracic spine: Secondary | ICD-10-CM | POA: Diagnosis not present

## 2015-05-22 DIAGNOSIS — M546 Pain in thoracic spine: Secondary | ICD-10-CM | POA: Diagnosis not present

## 2015-05-22 DIAGNOSIS — G8929 Other chronic pain: Secondary | ICD-10-CM | POA: Diagnosis not present

## 2015-05-22 DIAGNOSIS — Z79891 Long term (current) use of opiate analgesic: Secondary | ICD-10-CM | POA: Diagnosis not present

## 2015-05-22 DIAGNOSIS — M545 Low back pain: Secondary | ICD-10-CM | POA: Diagnosis not present

## 2015-05-23 ENCOUNTER — Encounter: Payer: Self-pay | Admitting: Adult Health

## 2015-05-23 ENCOUNTER — Ambulatory Visit (INDEPENDENT_AMBULATORY_CARE_PROVIDER_SITE_OTHER): Payer: Medicare Other | Admitting: Adult Health

## 2015-05-23 VITALS — BP 132/76 | HR 64 | Temp 97.6°F | Ht 68.0 in | Wt 236.0 lb

## 2015-05-23 DIAGNOSIS — G4733 Obstructive sleep apnea (adult) (pediatric): Secondary | ICD-10-CM | POA: Diagnosis not present

## 2015-05-23 DIAGNOSIS — J439 Emphysema, unspecified: Secondary | ICD-10-CM

## 2015-05-23 MED ORDER — TIOTROPIUM BROMIDE MONOHYDRATE 18 MCG IN CAPS: 18.0000 ug | ORAL_CAPSULE | Freq: Every day | RESPIRATORY_TRACT | Status: DC

## 2015-05-23 MED ORDER — FLUTICASONE-SALMETEROL 250-50 MCG/DOSE IN AEPB: 1.0000 | INHALATION_SPRAY | Freq: Two times a day (BID) | RESPIRATORY_TRACT | Status: DC

## 2015-05-23 NOTE — Progress Notes (Signed)
Reviewed and agree with assessment/plan. 

## 2015-05-23 NOTE — Patient Instructions (Addendum)
Continue on CPAP At bedtime   Work on weight loss.  Do not drive if sleepy.  Continue on Spiriva and Advair .  Follow up Dr. Halford Chessman  In 4 months and As needed

## 2015-05-23 NOTE — Addendum Note (Signed)
Addended by: Osa Craver on: 05/23/2015 03:11 PM   Modules accepted: Orders

## 2015-05-23 NOTE — Progress Notes (Signed)
   Subjective:    Patient ID: Cristian Yang, male    DOB: 02/14/1935, 80 y.o.   MRN: [id number]  HPI  80 yo former smoker male    TESTS: PSG 09/29/02 >> RDI 12, SaO2 low 85% PFT 12/12/14 >> FEV1 1.61 (60%), FEV1% 66, TLC 5.48 (82%), DLCO 47%, no BD  05/23/2015 Follow up : OSA  Pt returns for 1 month follow up for sleep apnea.  Does complain of nasal stuffiness. We discussed using saline nasal rinses and saline gel.  Unable to download CPAP .  New card was requested from DME.   Remain on Spiriva and Advair.  Says his breathing is doing okay. He says he remains active.  No flare of cough or wheezing  No ER or hospital stays.  CXR in 06/09/14 with nad .  Flu shot utd. . Pnuemovax is utd.  Declines pulm rehab.     Review of Systems  Constitutional:   No  weight loss, night sweats,  Fevers, chills,  +fatigue, or  lassitude.  HEENT:   No headaches,  Difficulty swallowing,  Tooth/dental problems, or  Sore throat,                No sneezing, itching, ear ache, nasal congestion, post nasal drip,   CV:  No chest pain,  Orthopnea, PND, swelling in lower extremities, anasarca, dizziness, palpitations, syncope.   GI  No heartburn, indigestion, abdominal pain, nausea, vomiting, diarrhea, change in bowel habits, loss of appetite, bloody stools.   Resp: no  non-productive cough,  No coughing up of blood.  No change in color of mucus.  No wheezing.  No chest wall deformity  Skin: no rash or lesions.  GU: no dysuria, change in color of urine, no urgency or frequency.  No flank pain, no hematuria   MS:  No joint pain or swelling.  No decreased range of motion.  No back pain.  Psych:  No change in mood or affect. No depression or anxiety.  No memory loss.          Objective:   Physical Exam .GEN: A/Ox3; pleasant , NAD, obese   HEENT:  [place]/AT,  EACs-clear, TMs-wnl, NOSE-clear, THROAT-clear, no lesions, no postnasal drip or exudate noted. , Class 2-3 MP airway   NECK:  Supple w/  fair ROM; no JVD; normal carotid impulses w/o bruits; no thyromegaly or nodules palpated; no lymphadenopathy.  RESP  Clear  P & A; w/o, wheezes/ rales/ or rhonchi.no accessory muscle use, no dullness to percussion  CARD:  RRR, no m/r/g  , no peripheral edema, pulses intact, no cyanosis or clubbing.  GI:   Soft & nt; nml bowel sounds; no organomegaly or masses detected.  Musco: Warm bil, no deformities or joint swelling noted.   Neuro: alert, no focal deficits noted.    Skin: Warm, no lesions or rashes         Assessment & Plan:

## 2015-05-23 NOTE — Assessment & Plan Note (Signed)
  Continue on Spiriva and Advair .  Follow up Dr. Halford Chessman  In 4 months and As needed

## 2015-05-23 NOTE — Addendum Note (Signed)
Addended by: Osa Craver on: 05/23/2015 03:09 PM   Modules accepted: Orders

## 2015-05-23 NOTE — Assessment & Plan Note (Signed)
Continue on CPAP At bedtime   Work on weight loss.  Do not drive if sleepy.  Follow up Dr. Halford Chessman  In 4 months and As needed

## 2015-06-01 DIAGNOSIS — E039 Hypothyroidism, unspecified: Secondary | ICD-10-CM | POA: Diagnosis not present

## 2015-06-19 ENCOUNTER — Telehealth: Payer: Self-pay | Admitting: Pulmonary Disease

## 2015-06-19 DIAGNOSIS — M545 Low back pain: Secondary | ICD-10-CM | POA: Diagnosis not present

## 2015-06-19 DIAGNOSIS — M79604 Pain in right leg: Secondary | ICD-10-CM | POA: Diagnosis not present

## 2015-06-19 DIAGNOSIS — M542 Cervicalgia: Secondary | ICD-10-CM | POA: Diagnosis not present

## 2015-06-19 DIAGNOSIS — G8929 Other chronic pain: Secondary | ICD-10-CM | POA: Diagnosis not present

## 2015-06-19 DIAGNOSIS — Z79891 Long term (current) use of opiate analgesic: Secondary | ICD-10-CM | POA: Diagnosis not present

## 2015-06-19 DIAGNOSIS — M79605 Pain in left leg: Secondary | ICD-10-CM | POA: Diagnosis not present

## 2015-06-19 MED ORDER — ALBUTEROL SULFATE (2.5 MG/3ML) 0.083% IN NEBU: 2.5000 mg | INHALATION_SOLUTION | Freq: Every day | RESPIRATORY_TRACT | Status: DC

## 2015-06-19 MED ORDER — MOMETASONE FUROATE 50 MCG/ACT NA SUSP: 2.0000 | Freq: Every day | NASAL | Status: DC

## 2015-06-19 MED ORDER — FLUTICASONE-SALMETEROL 250-50 MCG/DOSE IN AEPB: 1.0000 | INHALATION_SPRAY | Freq: Two times a day (BID) | RESPIRATORY_TRACT | Status: DC

## 2015-06-19 MED ORDER — MONTELUKAST SODIUM 10 MG PO TABS: 10.0000 mg | ORAL_TABLET | Freq: Every day | ORAL | Status: DC

## 2015-06-19 MED ORDER — ALBUTEROL SULFATE HFA 108 (90 BASE) MCG/ACT IN AERS: 2.0000 | INHALATION_SPRAY | Freq: Four times a day (QID) | RESPIRATORY_TRACT | Status: DC | PRN

## 2015-06-19 NOTE — Telephone Encounter (Signed)
Patients spouse says that patient needs Advair 250/50, Singulair, ProAir, Nasonex and Albuterol (neb) sent as 90 day supply to [facility]. (did not see Singulair on pt's med list, but spouse read the label from his bottle and he is taking Montelukast 10mg  daily)  Dr. Halford Chessman, ok to refill 90 days on all of these meds?  Please advise.

## 2015-06-19 NOTE — Telephone Encounter (Signed)
Okay to send refills. 

## 2015-06-19 NOTE — Telephone Encounter (Signed)
Rx sent to [facility] per pt's request. Nothing further needed. Closing encounter

## 2015-06-26 DIAGNOSIS — Z85820 Personal history of malignant melanoma of skin: Secondary | ICD-10-CM | POA: Diagnosis not present

## 2015-06-26 DIAGNOSIS — Z85828 Personal history of other malignant neoplasm of skin: Secondary | ICD-10-CM | POA: Diagnosis not present

## 2015-06-26 DIAGNOSIS — L821 Other seborrheic keratosis: Secondary | ICD-10-CM | POA: Diagnosis not present

## 2015-06-26 DIAGNOSIS — L820 Inflamed seborrheic keratosis: Secondary | ICD-10-CM | POA: Diagnosis not present

## 2015-06-28 ENCOUNTER — Other Ambulatory Visit: Payer: Self-pay | Admitting: Pulmonary Disease

## 2015-06-28 NOTE — Telephone Encounter (Signed)
Called and spoke with pt's wife  Wife requesting 90 day supply on ventolin HFA, albuterol neb, and nasonex Informed wife that 31 day supply refills were [facility] on [facility]/19/2015 Wife informed to call the pharmacy and if not received to call office back to notify  Wife voiced understanding  Nothing further is needed

## 2015-07-05 ENCOUNTER — Telehealth: Payer: Self-pay | Admitting: Pulmonary Disease

## 2015-07-05 DIAGNOSIS — J209 Acute bronchitis, unspecified: Secondary | ICD-10-CM | POA: Diagnosis not present

## 2015-07-05 DIAGNOSIS — J449 Chronic obstructive pulmonary disease, unspecified: Secondary | ICD-10-CM | POA: Diagnosis not present

## 2015-07-05 NOTE — Telephone Encounter (Signed)
lmtcb x1 for pt. 

## 2015-07-06 NOTE — Telephone Encounter (Signed)
Pt verified Rx needing to be sent and pharmacy. Sent Spiriva to [facility]. Nothing further needed.

## 2015-07-07 ENCOUNTER — Telehealth: Payer: Self-pay | Admitting: Pulmonary Disease

## 2015-07-07 DIAGNOSIS — Z0000 Encounter for general adult medical examination without abnormal findings: Secondary | ICD-10-CM | POA: Diagnosis not present

## 2015-07-07 DIAGNOSIS — Z23 Encounter for immunization: Secondary | ICD-10-CM | POA: Diagnosis not present

## 2015-07-07 MED ORDER — FLUTICASONE-SALMETEROL 250-50 MCG/DOSE IN AEPB: 1.0000 | INHALATION_SPRAY | Freq: Two times a day (BID) | RESPIRATORY_TRACT | Status: DC

## 2015-07-07 MED ORDER — TIOTROPIUM BROMIDE MONOHYDRATE 18 MCG IN CAPS: 18.0000 ug | ORAL_CAPSULE | Freq: Every day | RESPIRATORY_TRACT | Status: DC

## 2015-07-07 NOTE — Telephone Encounter (Signed)
Called spoke with spouse. Aware I have sent in refills for advair and spiriva to [facility]. Nothing further needed

## 2015-07-13 DIAGNOSIS — G8929 Other chronic pain: Secondary | ICD-10-CM | POA: Diagnosis not present

## 2015-07-13 DIAGNOSIS — M542 Cervicalgia: Secondary | ICD-10-CM | POA: Diagnosis not present

## 2015-07-13 DIAGNOSIS — Z79891 Long term (current) use of opiate analgesic: Secondary | ICD-10-CM | POA: Diagnosis not present

## 2015-07-13 DIAGNOSIS — M545 Low back pain: Secondary | ICD-10-CM | POA: Diagnosis not present

## 2015-07-31 DIAGNOSIS — R0602 Shortness of breath: Secondary | ICD-10-CM | POA: Diagnosis not present

## 2015-07-31 DIAGNOSIS — J209 Acute bronchitis, unspecified: Secondary | ICD-10-CM | POA: Diagnosis not present

## 2015-08-03 DIAGNOSIS — J209 Acute bronchitis, unspecified: Secondary | ICD-10-CM | POA: Diagnosis not present

## 2015-08-03 DIAGNOSIS — J159 Unspecified bacterial pneumonia: Secondary | ICD-10-CM | POA: Diagnosis not present

## 2015-08-08 DIAGNOSIS — J159 Unspecified bacterial pneumonia: Secondary | ICD-10-CM | POA: Diagnosis not present

## 2015-08-16 DIAGNOSIS — J159 Unspecified bacterial pneumonia: Secondary | ICD-10-CM | POA: Diagnosis not present

## 2015-08-16 DIAGNOSIS — J449 Chronic obstructive pulmonary disease, unspecified: Secondary | ICD-10-CM | POA: Diagnosis not present

## 2015-08-24 DIAGNOSIS — G890 Central pain syndrome: Secondary | ICD-10-CM | POA: Diagnosis not present

## 2015-08-24 DIAGNOSIS — G894 Chronic pain syndrome: Secondary | ICD-10-CM | POA: Diagnosis not present

## 2015-08-24 DIAGNOSIS — M542 Cervicalgia: Secondary | ICD-10-CM | POA: Diagnosis not present

## 2015-09-01 ENCOUNTER — Other Ambulatory Visit: Payer: Self-pay | Admitting: *Deleted

## 2015-09-01 MED ORDER — ALBUTEROL SULFATE (2.5 MG/3ML) 0.083% IN NEBU: 2.5000 mg | INHALATION_SOLUTION | Freq: Every day | RESPIRATORY_TRACT | Status: DC

## 2015-09-13 IMAGING — CR DG CHEST 2V
2 series · 2 of 2 positions shown · non-contrast
Comparison: None.

CLINICAL DATA: Shortness of breath.

EXAM:
CHEST  2 VIEW

[view not recorded (1 of 2)]
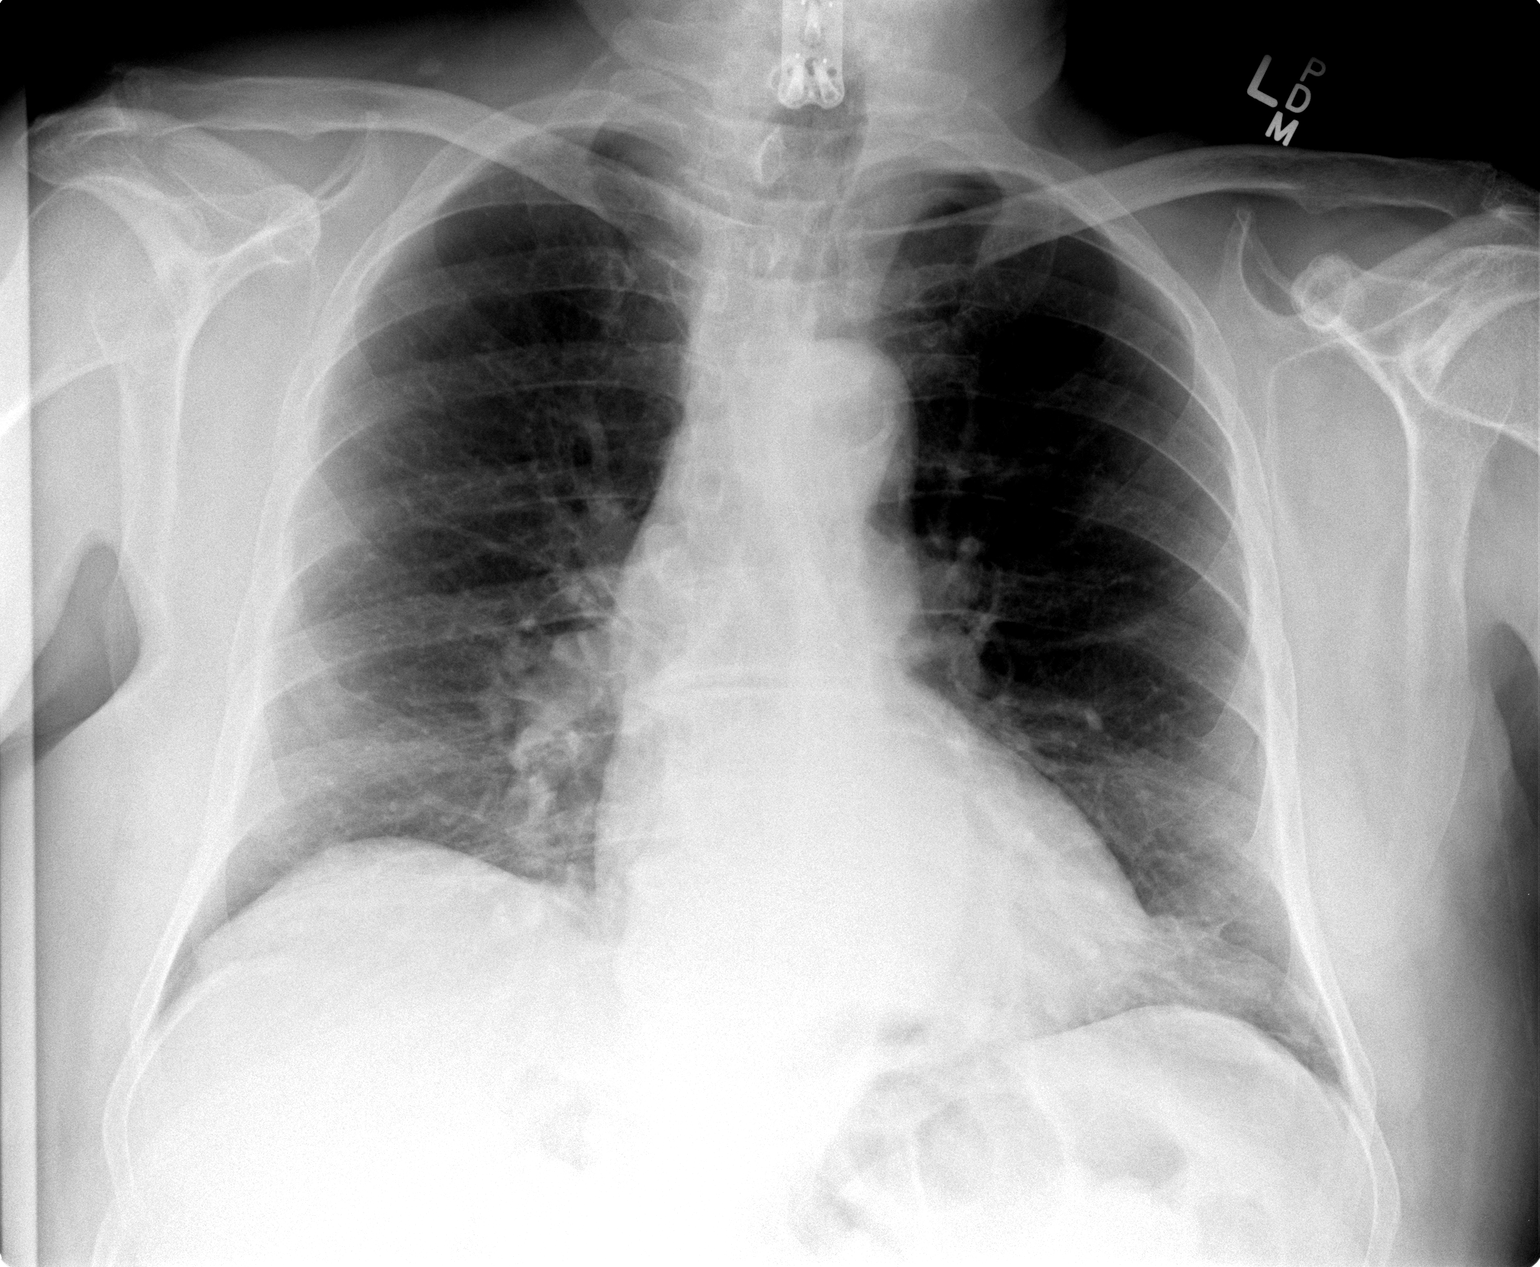

[view not recorded (2 of 2)]
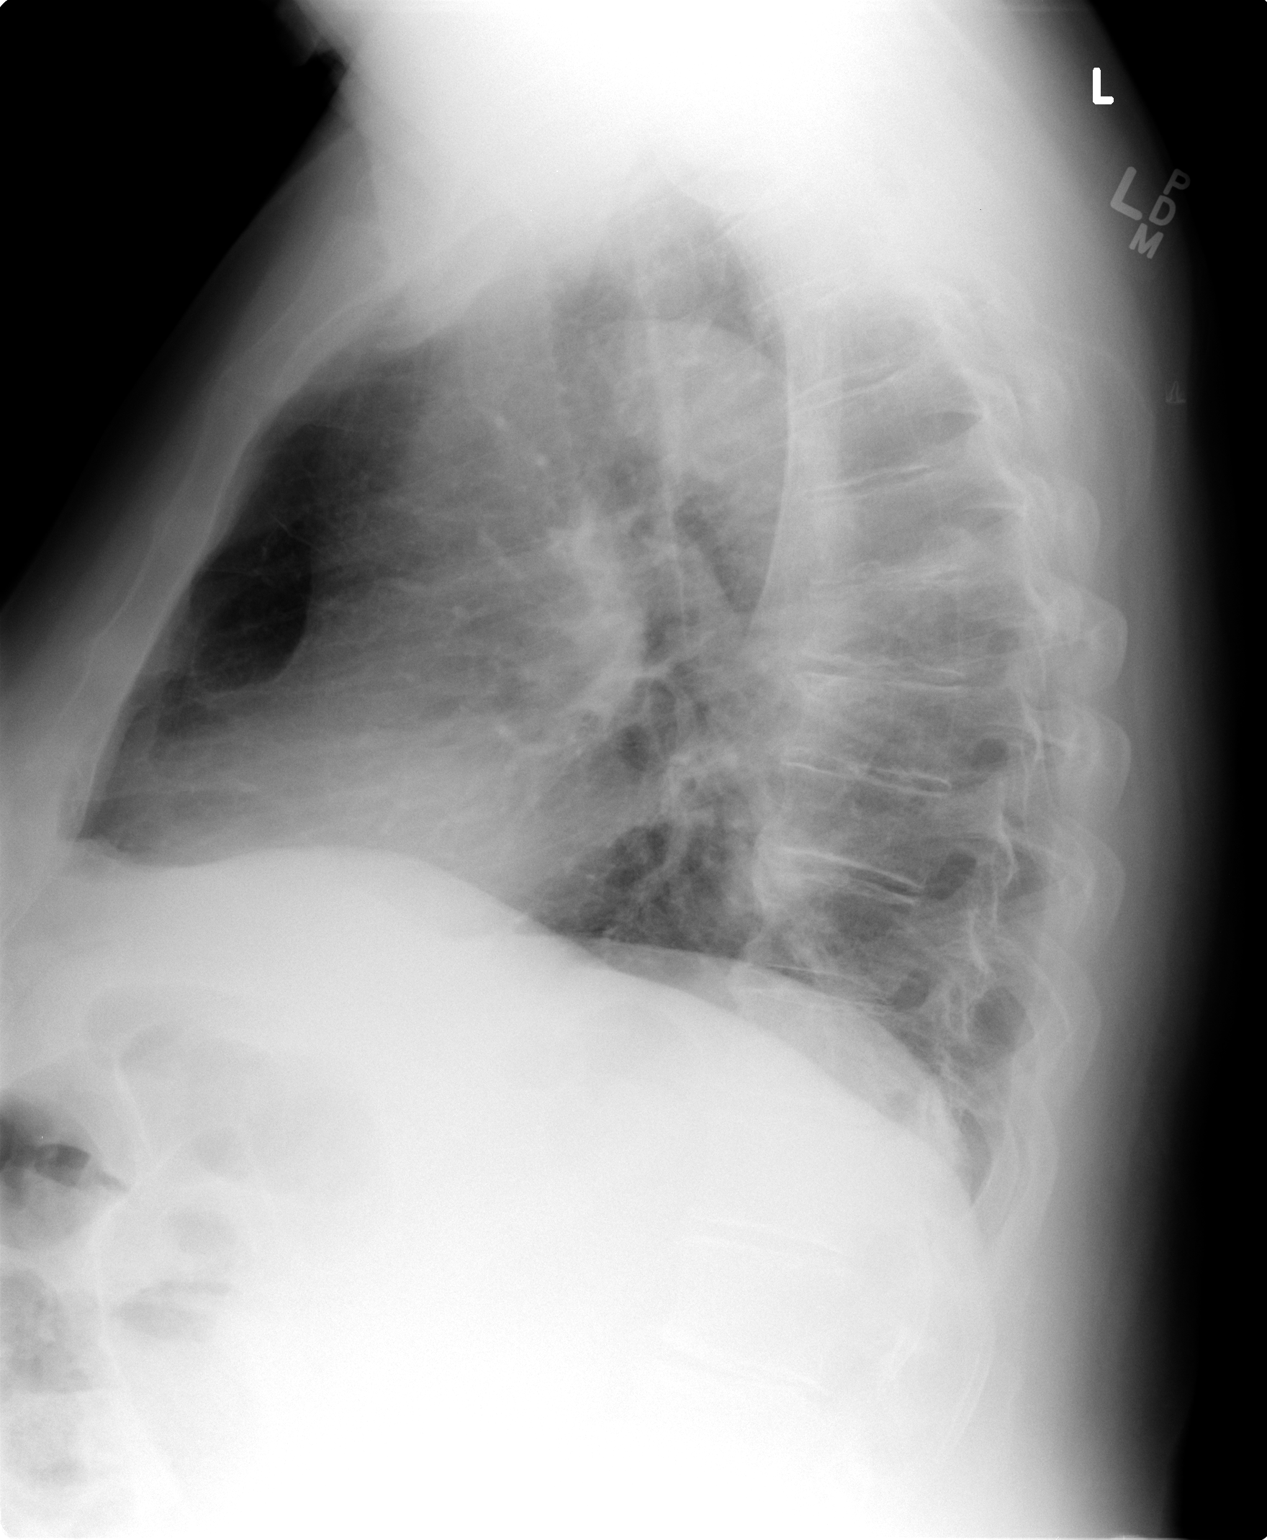

[2 of 2 positions shown; findings below may reference images not displayed]

FINDINGS: Lungs are adequately inflated without consolidation or effusion.
Cardiomediastinal silhouette is within normal. Possible hiatal
hernia. There is calcification over the aortic arch. There is mild
anterior wedging of a mid thoracic vertebral body. Fusion hardware
is present at the cervical thoracic junction.
IMPRESSION: No active cardiopulmonary disease.

## 2015-09-15 DIAGNOSIS — M545 Low back pain: Secondary | ICD-10-CM | POA: Diagnosis not present

## 2015-09-15 DIAGNOSIS — G8929 Other chronic pain: Secondary | ICD-10-CM | POA: Diagnosis not present

## 2015-09-15 DIAGNOSIS — E7800 Pure hypercholesterolemia, unspecified: Secondary | ICD-10-CM | POA: Diagnosis not present

## 2015-09-15 DIAGNOSIS — Z79899 Other long term (current) drug therapy: Secondary | ICD-10-CM | POA: Diagnosis not present

## 2015-09-15 DIAGNOSIS — J449 Chronic obstructive pulmonary disease, unspecified: Secondary | ICD-10-CM | POA: Diagnosis not present

## 2015-09-19 ENCOUNTER — Encounter: Payer: Self-pay | Admitting: Pulmonary Disease

## 2015-09-19 ENCOUNTER — Ambulatory Visit (INDEPENDENT_AMBULATORY_CARE_PROVIDER_SITE_OTHER): Payer: Medicare Other | Admitting: Pulmonary Disease

## 2015-09-19 VITALS — BP 132/68 | HR 61 | Ht 68.0 in | Wt 223.4 lb

## 2015-09-19 DIAGNOSIS — J449 Chronic obstructive pulmonary disease, unspecified: Secondary | ICD-10-CM

## 2015-09-19 DIAGNOSIS — J439 Emphysema, unspecified: Secondary | ICD-10-CM

## 2015-09-19 DIAGNOSIS — G4733 Obstructive sleep apnea (adult) (pediatric): Secondary | ICD-10-CM

## 2015-09-19 NOTE — Patient Instructions (Signed)
Follow up in 6 months 

## 2015-09-19 NOTE — Progress Notes (Signed)
Current Outpatient Prescriptions on File Prior to Visit  Medication Sig  . albuterol (PROVENTIL HFA;VENTOLIN HFA) 108 (90 BASE) MCG/ACT inhaler Inhale 2 puffs into the lungs every 6 (six) hours as needed for wheezing or shortness of breath.  Marland Kitchen albuterol (PROVENTIL) (2.5 MG/3ML) 0.083% nebulizer solution Take 3 mLs (2.5 mg total) by nebulization daily.  . cetirizine (ZYRTEC) 10 MG tablet Take 10 mg by mouth daily.  . citalopram (CELEXA) 20 MG tablet Take 20 mg by mouth daily.  Marland Kitchen docusate sodium (COLACE) 100 MG capsule Take 100 mg by mouth daily.   . Fluticasone-Salmeterol (ADVAIR) 250-50 MCG/DOSE AEPB Inhale 1 puff into the lungs 2 (two) times daily.  Marland Kitchen levothyroxine (SYNTHROID, LEVOTHROID) 25 MCG tablet Take 75 mcg by mouth daily before breakfast.   . mometasone (NASONEX) 50 MCG/ACT nasal spray Place 2 sprays into the nose daily.  . montelukast (SINGULAIR) 10 MG tablet Take 1 tablet (10 mg total) by mouth at bedtime.  . naproxen (NAPROSYN) 500 MG tablet Take 1 tablet (500 mg total) by mouth 2 (two) times daily with a meal.  . pravastatin (PRAVACHOL) 20 MG tablet Take 20 mg by mouth daily.  Marland Kitchen tiotropium (SPIRIVA) 18 MCG inhalation capsule Place 1 capsule (18 mcg total) into inhaler and inhale daily.  . valsartan (DIOVAN) 160 MG tablet Take 160 mg by mouth 2 (two) times daily.    No current facility-administered medications on file prior to visit.    Chief Complaint  Patient presents with  . Follow-up    DME:APS; Wears CPAP nightly - given new "refurbished" machine about 6 months ago.     Tests PSG 09/29/02 >> AHI 12, SaO2 low 85% PFT 12/12/14 >> FEV1 1.61 (60%), FEV1% 66, TLC 5.48 (82%), DLCO 47%, no BD  Past medical history GERD, HLD, HTN, Hypothyroidism, Depression  Past surgical hx, Medications, Allergies, Family hx, Social hx all reviewed.  Vital signs BP 132/68 mmHg  Pulse 61  Ht 5\' 8"  (1.727 m)  Wt 223 lb 6.4 oz (101.334 kg)  BMI 33.98 kg/m2  SpO2 93%  History of Present  Illness: Cristian Yang is a 81 y.o. male former smoker with COPD/emphysema and OSA.  He has been doing well.  He has been more worried about his wife's health than his own.    He is not having much cough, wheeze, or sputum.  He denies chest pain, hemoptysis, or leg swelling.  He keeps up with his activities reasonably well.  He received a refurbished CPAP machine.  This has been working better for him, and he feels his sleep has improved.  Physical Exam:  General - No distress ENT - No sinus tenderness, no oral exudate, no LAN Cardiac - s1s2 regular, no murmur Chest - No wheeze/rales/dullness Back - No focal tenderness Abd - Soft, non-tender Ext - No edema Neuro - Normal strength Skin - No rashes Psych - normal mood, and behavior   Assessment/Plan:  COPD with emphysema. Plan: - continue spiriva, advair, and prn albuterol - will discuss option of pulmonary rehab at his next visit  Obstructive sleep apnea. He reports compliance with CPAP and benefit from therapy. Plan: - continue CPAP qhs  Upper airway cough with post-nasal drip. Plan: - continue nasonex, singulair and zyrtec   Patient Instructions  Follow up in 6 months    Cristian Mires, MD [facility] Pager:  [phone]

## 2015-10-02 DIAGNOSIS — L821 Other seborrheic keratosis: Secondary | ICD-10-CM | POA: Diagnosis not present

## 2015-10-02 DIAGNOSIS — L570 Actinic keratosis: Secondary | ICD-10-CM | POA: Diagnosis not present

## 2015-10-02 DIAGNOSIS — D485 Neoplasm of uncertain behavior of skin: Secondary | ICD-10-CM | POA: Diagnosis not present

## 2015-10-02 DIAGNOSIS — L2081 Atopic neurodermatitis: Secondary | ICD-10-CM | POA: Diagnosis not present

## 2015-10-10 ENCOUNTER — Telehealth: Payer: Self-pay | Admitting: Pulmonary Disease

## 2015-10-10 NOTE — Telephone Encounter (Signed)
lmtcb for pt.  

## 2015-10-12 DIAGNOSIS — G894 Chronic pain syndrome: Secondary | ICD-10-CM | POA: Diagnosis not present

## 2015-10-12 DIAGNOSIS — M545 Low back pain: Secondary | ICD-10-CM | POA: Diagnosis not present

## 2015-10-12 DIAGNOSIS — M542 Cervicalgia: Secondary | ICD-10-CM | POA: Diagnosis not present

## 2015-10-12 DIAGNOSIS — G890 Central pain syndrome: Secondary | ICD-10-CM | POA: Diagnosis not present

## 2015-10-12 DIAGNOSIS — G8929 Other chronic pain: Secondary | ICD-10-CM | POA: Diagnosis not present

## 2015-10-12 NOTE — Telephone Encounter (Signed)
LM x 1 

## 2015-10-13 NOTE — Telephone Encounter (Signed)
lmtcb X2 for pt.  

## 2015-10-14 ENCOUNTER — Emergency Department (HOSPITAL_COMMUNITY)
Admission: EM | Admit: 2015-10-14 | Discharge: 2015-10-14 | Disposition: A | Discharge status: Home / Self Care | Payer: Medicare Other | Attending: Emergency Medicine | Admitting: Emergency Medicine

## 2015-10-14 ENCOUNTER — Encounter (HOSPITAL_COMMUNITY): Payer: Self-pay | Admitting: Nurse Practitioner

## 2015-10-14 ENCOUNTER — Emergency Department (HOSPITAL_COMMUNITY): Payer: Medicare Other

## 2015-10-14 DIAGNOSIS — Z85828 Personal history of other malignant neoplasm of skin: Secondary | ICD-10-CM | POA: Diagnosis not present

## 2015-10-14 DIAGNOSIS — Z8669 Personal history of other diseases of the nervous system and sense organs: Secondary | ICD-10-CM | POA: Diagnosis not present

## 2015-10-14 DIAGNOSIS — Z791 Long term (current) use of non-steroidal anti-inflammatories (NSAID): Secondary | ICD-10-CM | POA: Diagnosis not present

## 2015-10-14 DIAGNOSIS — E785 Hyperlipidemia, unspecified: Secondary | ICD-10-CM | POA: Insufficient documentation

## 2015-10-14 DIAGNOSIS — S60111A Contusion of right thumb with damage to nail, initial encounter: Secondary | ICD-10-CM | POA: Diagnosis not present

## 2015-10-14 DIAGNOSIS — M1990 Unspecified osteoarthritis, unspecified site: Secondary | ICD-10-CM | POA: Insufficient documentation

## 2015-10-14 DIAGNOSIS — Y9389 Activity, other specified: Secondary | ICD-10-CM | POA: Insufficient documentation

## 2015-10-14 DIAGNOSIS — Z7951 Long term (current) use of inhaled steroids: Secondary | ICD-10-CM | POA: Insufficient documentation

## 2015-10-14 DIAGNOSIS — Z8719 Personal history of other diseases of the digestive system: Secondary | ICD-10-CM | POA: Insufficient documentation

## 2015-10-14 DIAGNOSIS — S6701XA Crushing injury of right thumb, initial encounter: Secondary | ICD-10-CM | POA: Diagnosis not present

## 2015-10-14 DIAGNOSIS — Z87891 Personal history of nicotine dependence: Secondary | ICD-10-CM | POA: Diagnosis not present

## 2015-10-14 DIAGNOSIS — W231XXA Caught, crushed, jammed, or pinched between stationary objects, initial encounter: Secondary | ICD-10-CM | POA: Insufficient documentation

## 2015-10-14 DIAGNOSIS — E039 Hypothyroidism, unspecified: Secondary | ICD-10-CM | POA: Diagnosis not present

## 2015-10-14 DIAGNOSIS — S6991XA Unspecified injury of right wrist, hand and finger(s), initial encounter: Secondary | ICD-10-CM | POA: Diagnosis not present

## 2015-10-14 DIAGNOSIS — Y9289 Other specified places as the place of occurrence of the external cause: Secondary | ICD-10-CM | POA: Insufficient documentation

## 2015-10-14 DIAGNOSIS — Z79899 Other long term (current) drug therapy: Secondary | ICD-10-CM | POA: Insufficient documentation

## 2015-10-14 DIAGNOSIS — Y998 Other external cause status: Secondary | ICD-10-CM | POA: Insufficient documentation

## 2015-10-14 DIAGNOSIS — F329 Major depressive disorder, single episode, unspecified: Secondary | ICD-10-CM | POA: Insufficient documentation

## 2015-10-14 DIAGNOSIS — I10 Essential (primary) hypertension: Secondary | ICD-10-CM | POA: Insufficient documentation

## 2015-10-14 DIAGNOSIS — S6010XA Contusion of unspecified finger with damage to nail, initial encounter: Secondary | ICD-10-CM

## 2015-10-14 NOTE — Discharge Instructions (Signed)
Follow-up with your doctor in 3 days for wound recheck. Return to ED for any new or worsening symptoms. Keep wound clean and dry.  Subungual Hematoma A subungual hematoma is a pocket of blood that collects under the fingernail or toenail. The pressure created by the blood under the nail can cause pain. CAUSES  A subungual hematoma occurs when an injury to the finger or toe causes a blood vessel beneath the nail to break. The injury can occur from a direct blow such as slamming a finger in a door. It can also occur from a repeated injury such as pressure on the foot in a shoe while running. A subungual hematoma is sometimes called runner's toe or tennis toe. SYMPTOMS   Blue or dark blue skin under the nail.  Pain or throbbing in the injured area. DIAGNOSIS  Your caregiver can determine whether you have a subungual hematoma based on your history and a physical exam. If your caregiver thinks you might have a broken (fractured) bone, X-rays may be taken. TREATMENT  Hematomas usually go away on their own over time. Your caregiver may make a hole in the nail to drain the blood. Draining the blood is painless and usually provides significant relief from pain and throbbing. The nail usually grows back normally after this procedure. In some cases, the nail may need to be removed. This is done if there is a cut under the nail that requires stitches (sutures). HOME CARE INSTRUCTIONS   Put ice on the injured area.  Put ice in a plastic bag.  Place a towel between your skin and the bag.  Leave the ice on for 15-20 minutes, 03-04 times a day for the first 1 to 2 days.  Elevate the injured area to help decrease pain and swelling.  If you were given a bandage, wear it for as long as directed by your caregiver.  If part of your nail falls off, trim the remaining nail gently. This prevents the nail from catching on something and causing further injury.  Only take over-the-counter or prescription  medicines for pain, discomfort, or fever as directed by your caregiver. SEEK IMMEDIATE MEDICAL CARE IF:   You have redness or swelling around the nail.  You have yellowish-white fluid (pus) coming from the nail.  Your pain is not controlled with medicine.  You have a fever. MAKE SURE YOU:  Understand these instructions.  Will watch your condition.  Will get help right away if you are not doing well or get worse.   This information is not intended to replace advice given to you by your health care provider. Make sure you discuss any questions you have with your health care provider.   Document Released: 07/05/2000 Document Revised: 09/30/2011 Document Reviewed: 11/23/2014 Elsevier Interactive Patient Education [employer].

## 2015-10-14 NOTE — ED Provider Notes (Signed)
CSN: [id number]     Arrival date & time 10/14/15  1615 History   First MD Initiated Contact with Patient 10/14/15 1655     Chief Complaint  Patient presents with  . Finger Injury     (Consider location/radiation/quality/duration/timing/severity/associated sxs/prior Treatment) HPI ADONIS CONTO is a 81 y.o. male who comes in for evaluation of right thumb injury that occurred just prior to arrival. Patient takes 81 mg aspirin daily. Patient reports he was throwing a piece of furniture into the dumpster when one of the furniture legs smashed his finger against the dumpster. He reports sudden onset, throbbing pain. He reports initial relief with the application of ice and some pressure. Pain has gradually gotten worse. Denies any numbness or weakness. He does have mild bleeding around right thumb nailbed. Denies any other numbness or weakness. No other modifying factors.  Past Medical History  Diagnosis Date  . COPD (chronic obstructive pulmonary disease) ([place])   . Sleep apnea   . GERD (gastroesophageal reflux disease)   . Hyperlipidemia   . Arthritis   . Skin cancer   . Hypertension   . Hypothyroidism   . Depression    Past Surgical History  Procedure Laterality Date  . Skin cancer excision  2013  . Prostate surgery    . Neck surgery    . Skin cancer removed      x3   Family History  Problem Relation Age of Onset  . Diabetes Mother   . Heart disease Mother   . Liver cancer Brother   . Bone cancer Sister   . Asthma Father    Social History  Substance Use Topics  . Smoking status: Former Smoker -- 2.00 packs/day for 30 years    Types: Cigarettes    Quit date: 07/23/2003  . Smokeless tobacco: Never Used  . Alcohol Use: No     Comment: quit 2005    Review of Systems A 10 point review of systems was completed and was negative except for pertinent positives and negatives as mentioned in the history of present illness     Allergies  Ciprofloxacin and Codeine  Home  Medications   Prior to Admission medications   Medication Sig Start Date End Date Taking? Authorizing Provider  albuterol (PROVENTIL HFA;VENTOLIN HFA) 108 (90 BASE) MCG/ACT inhaler Inhale 2 puffs into the lungs every 6 (six) hours as needed for wheezing or shortness of breath. 06/19/15   Chesley Mires, MD  albuterol (PROVENTIL) (2.5 MG/3ML) 0.083% nebulizer solution Take 3 mLs (2.5 mg total) by nebulization daily. 09/01/15   Chesley Mires, MD  cetirizine (ZYRTEC) 10 MG tablet Take 10 mg by mouth daily.    Historical Provider, MD  citalopram (CELEXA) 20 MG tablet Take 20 mg by mouth daily.    Historical Provider, MD  docusate sodium (COLACE) 100 MG capsule Take 100 mg by mouth daily.     Historical Provider, MD  Fluticasone-Salmeterol (ADVAIR) 250-50 MCG/DOSE AEPB Inhale 1 puff into the lungs 2 (two) times daily. 07/07/15   Chesley Mires, MD  levothyroxine (SYNTHROID, LEVOTHROID) 25 MCG tablet Take 75 mcg by mouth daily before breakfast.     Historical Provider, MD  mometasone (NASONEX) 50 MCG/ACT nasal spray Place 2 sprays into the nose daily. 06/19/15   Chesley Mires, MD  montelukast (SINGULAIR) 10 MG tablet Take 1 tablet (10 mg total) by mouth at bedtime. 06/19/15   Chesley Mires, MD  naproxen (NAPROSYN) 500 MG tablet Take 1 tablet (500 mg total) by mouth  2 (two) times daily with a meal. 08/31/14   Tammy Triplett, PA-C  pravastatin (PRAVACHOL) 20 MG tablet Take 20 mg by mouth daily.    Historical Provider, MD  tiotropium (SPIRIVA) 18 MCG inhalation capsule Place 1 capsule (18 mcg total) into inhaler and inhale daily. 07/07/15   Chesley Mires, MD  valsartan (DIOVAN) 160 MG tablet Take 160 mg by mouth 2 (two) times daily.     Historical Provider, MD   BP 150/79 mmHg  Pulse 61  Temp(Src) 98.1 F (36.7 C) (Oral)  Resp 16  Wt 106.505 kg  SpO2 100% Physical Exam  Constitutional:  Awake, alert, nontoxic appearance.  HENT:  Head: Atraumatic.  Eyes: Right eye exhibits no discharge. Left eye exhibits no  discharge.  Neck: Neck supple.  Pulmonary/Chest: Effort normal. He exhibits no tenderness.  Abdominal: Soft. There is no tenderness. There is no rebound.  Musculoskeletal: He exhibits no tenderness.  Baseline ROM, no obvious new focal weakness. Right thumb with subungual hematoma at base of nail, roughly 50%. There is also bleeding diffusely around the cuticle and lateral sides of nail. No overt swelling, erythema or other ecchymosis on digit. Maintains full active range of motion. Pulses intact sensation intact  Neurological:  Mental status and motor strength appears baseline for patient and situation.  Skin: No rash noted.  Psychiatric: He has a normal mood and affect.  Nursing note and vitals reviewed.   ED Course  Procedures (including critical care time)  NAIL TREPHINATION  Location right thumbnail Indication: Subungual hematoma Materials: Fine tip cautery Patient experiences relief. No immediate complications  Labs Review Labs Reviewed - No data to display  Imaging Review Dg Finger Thumb Right  10/14/2015  CLINICAL DATA:  Crush injury EXAM: RIGHT THUMB 2+V COMPARISON:  None. FINDINGS: Three views of the right thumb submitted. No acute fracture or subluxation. Mild degenerative changes first carpometacarpal joint. There is a metallic foreign body within soft tissue between distal aspect of first and second metacarpal measures about 1.8 mm. IMPRESSION: No acute fracture or subluxation. Mild degenerative changes first carpometacarpal joint. There is a metallic foreign body within soft tissue between distal aspect of first and second metacarpal measures about 1.8 mm. Electronically Signed   By: Lahoma Crocker M.D.   On: 10/14/2015 18:02   I have personally reviewed and evaluated these images and lab results as part of my medical decision-making.   EKG Interpretation None      MDM  WALLIS LAFOON is a 81 y.o. male presents for evaluation of right thumb pain after getting it  caught between a piece of furniture. Has subungual hematoma, relieved with trephination. Neurovascularly intact. X-rays negative for any fractures. No evidence of compartment syndrome. Discussed strict return precautions. Follow-up with PCP in 2-3 days. He verbalizes understanding and agrees with this plan and subsequent discharge. Voices no other questions or concerns at this time. Final diagnoses:  Thumb injury, right, initial encounter  Subungual hematoma of digit of hand, initial encounter        Comer Locket, PA-C 10/14/15 1857  Carmin Muskrat, MD 10/15/15 2140

## 2015-10-14 NOTE — ED Notes (Signed)
He crushed his R thumb moving furniture just PTA. Bruising under fingernail and blood oozing around nail bed now. He has been applying ice.

## 2015-10-16 ENCOUNTER — Other Ambulatory Visit: Payer: Self-pay | Admitting: *Deleted

## 2015-10-16 MED ORDER — TIOTROPIUM BROMIDE MONOHYDRATE 18 MCG IN CAPS: 18.0000 ug | ORAL_CAPSULE | Freq: Every day | RESPIRATORY_TRACT | Status: DC

## 2015-10-16 NOTE — Telephone Encounter (Signed)
Rx sent to [facility] Patient aware. Nothing further needed.

## 2015-10-18 DIAGNOSIS — S6990XA Unspecified injury of unspecified wrist, hand and finger(s), initial encounter: Secondary | ICD-10-CM | POA: Diagnosis not present

## 2015-10-18 DIAGNOSIS — I10 Essential (primary) hypertension: Secondary | ICD-10-CM | POA: Diagnosis not present

## 2015-10-18 DIAGNOSIS — J449 Chronic obstructive pulmonary disease, unspecified: Secondary | ICD-10-CM | POA: Diagnosis not present

## 2015-10-18 DIAGNOSIS — F329 Major depressive disorder, single episode, unspecified: Secondary | ICD-10-CM | POA: Diagnosis not present

## 2015-10-18 DIAGNOSIS — E785 Hyperlipidemia, unspecified: Secondary | ICD-10-CM | POA: Diagnosis not present

## 2015-10-25 DIAGNOSIS — S6990XA Unspecified injury of unspecified wrist, hand and finger(s), initial encounter: Secondary | ICD-10-CM | POA: Diagnosis not present

## 2015-10-25 DIAGNOSIS — L089 Local infection of the skin and subcutaneous tissue, unspecified: Secondary | ICD-10-CM | POA: Diagnosis not present

## 2015-11-09 DIAGNOSIS — M79675 Pain in left toe(s): Secondary | ICD-10-CM | POA: Diagnosis not present

## 2015-11-09 DIAGNOSIS — L600 Ingrowing nail: Secondary | ICD-10-CM | POA: Diagnosis not present

## 2015-11-10 DIAGNOSIS — M545 Low back pain: Secondary | ICD-10-CM | POA: Diagnosis not present

## 2015-11-10 DIAGNOSIS — Z79891 Long term (current) use of opiate analgesic: Secondary | ICD-10-CM | POA: Diagnosis not present

## 2015-11-10 DIAGNOSIS — G894 Chronic pain syndrome: Secondary | ICD-10-CM | POA: Diagnosis not present

## 2015-11-10 DIAGNOSIS — G8929 Other chronic pain: Secondary | ICD-10-CM | POA: Diagnosis not present

## 2015-11-11 ENCOUNTER — Other Ambulatory Visit: Payer: Self-pay | Admitting: Pulmonary Disease

## 2015-11-27 DIAGNOSIS — M79671 Pain in right foot: Secondary | ICD-10-CM | POA: Diagnosis not present

## 2015-11-27 DIAGNOSIS — B351 Tinea unguium: Secondary | ICD-10-CM | POA: Diagnosis not present

## 2015-11-27 DIAGNOSIS — M25571 Pain in right ankle and joints of right foot: Secondary | ICD-10-CM | POA: Diagnosis not present

## 2015-11-27 DIAGNOSIS — M79672 Pain in left foot: Secondary | ICD-10-CM | POA: Diagnosis not present

## 2015-11-27 DIAGNOSIS — M25572 Pain in left ankle and joints of left foot: Secondary | ICD-10-CM | POA: Diagnosis not present

## 2015-12-05 DIAGNOSIS — S6701XA Crushing injury of right thumb, initial encounter: Secondary | ICD-10-CM | POA: Diagnosis not present

## 2015-12-07 DIAGNOSIS — M5432 Sciatica, left side: Secondary | ICD-10-CM | POA: Diagnosis not present

## 2015-12-07 DIAGNOSIS — G603 Idiopathic progressive neuropathy: Secondary | ICD-10-CM | POA: Diagnosis not present

## 2015-12-07 DIAGNOSIS — G894 Chronic pain syndrome: Secondary | ICD-10-CM | POA: Diagnosis not present

## 2015-12-07 DIAGNOSIS — M5417 Radiculopathy, lumbosacral region: Secondary | ICD-10-CM | POA: Diagnosis not present

## 2015-12-07 DIAGNOSIS — Z79891 Long term (current) use of opiate analgesic: Secondary | ICD-10-CM | POA: Diagnosis not present

## 2015-12-10 ENCOUNTER — Encounter (HOSPITAL_COMMUNITY): Payer: Self-pay | Admitting: Emergency Medicine

## 2015-12-10 ENCOUNTER — Emergency Department (HOSPITAL_COMMUNITY)
Admission: EM | Admit: 2015-12-10 | Discharge: 2015-12-10 | Disposition: A | Discharge status: Home / Self Care | Payer: Medicare Other | Attending: Emergency Medicine | Admitting: Emergency Medicine

## 2015-12-10 ENCOUNTER — Emergency Department (HOSPITAL_COMMUNITY): Payer: Medicare Other

## 2015-12-10 DIAGNOSIS — Z87891 Personal history of nicotine dependence: Secondary | ICD-10-CM | POA: Diagnosis not present

## 2015-12-10 DIAGNOSIS — E785 Hyperlipidemia, unspecified: Secondary | ICD-10-CM | POA: Diagnosis not present

## 2015-12-10 DIAGNOSIS — Z85828 Personal history of other malignant neoplasm of skin: Secondary | ICD-10-CM | POA: Insufficient documentation

## 2015-12-10 DIAGNOSIS — W5501XA Bitten by cat, initial encounter: Secondary | ICD-10-CM | POA: Diagnosis not present

## 2015-12-10 DIAGNOSIS — Y999 Unspecified external cause status: Secondary | ICD-10-CM | POA: Insufficient documentation

## 2015-12-10 DIAGNOSIS — R112 Nausea with vomiting, unspecified: Secondary | ICD-10-CM | POA: Diagnosis not present

## 2015-12-10 DIAGNOSIS — S61452A Open bite of left hand, initial encounter: Secondary | ICD-10-CM | POA: Insufficient documentation

## 2015-12-10 DIAGNOSIS — M1990 Unspecified osteoarthritis, unspecified site: Secondary | ICD-10-CM | POA: Insufficient documentation

## 2015-12-10 DIAGNOSIS — Z79899 Other long term (current) drug therapy: Secondary | ICD-10-CM | POA: Diagnosis not present

## 2015-12-10 DIAGNOSIS — Y929 Unspecified place or not applicable: Secondary | ICD-10-CM | POA: Insufficient documentation

## 2015-12-10 DIAGNOSIS — E039 Hypothyroidism, unspecified: Secondary | ICD-10-CM | POA: Insufficient documentation

## 2015-12-10 DIAGNOSIS — Z7982 Long term (current) use of aspirin: Secondary | ICD-10-CM | POA: Insufficient documentation

## 2015-12-10 DIAGNOSIS — J449 Chronic obstructive pulmonary disease, unspecified: Secondary | ICD-10-CM | POA: Insufficient documentation

## 2015-12-10 DIAGNOSIS — Y939 Activity, unspecified: Secondary | ICD-10-CM | POA: Insufficient documentation

## 2015-12-10 DIAGNOSIS — I10 Essential (primary) hypertension: Secondary | ICD-10-CM | POA: Diagnosis not present

## 2015-12-10 DIAGNOSIS — W5503XA Scratched by cat, initial encounter: Secondary | ICD-10-CM

## 2015-12-10 DIAGNOSIS — F329 Major depressive disorder, single episode, unspecified: Secondary | ICD-10-CM | POA: Diagnosis not present

## 2015-12-10 LAB — CBC WITH DIFFERENTIAL/PLATELET
BASOS PCT: 0 %
Basophils Absolute: 0 10*3/uL (ref 0.0–0.1)
EOS PCT: 0 %
Eosinophils Absolute: 0 10*3/uL (ref 0.0–0.7)
HEMATOCRIT: 38.4 % — AB (ref 39.0–52.0)
Hemoglobin: 13.1 g/dL (ref 13.0–17.0)
LYMPHS ABS: 1.4 10*3/uL (ref 0.7–4.0)
Lymphocytes Relative: 8 %
MCH: 31.6 pg (ref 26.0–34.0)
MCHC: 34.1 g/dL (ref 30.0–36.0)
MCV: 92.5 fL (ref 78.0–100.0)
MONOS PCT: 7 %
Monocytes Absolute: 1.2 10*3/uL — ABNORMAL HIGH (ref 0.1–1.0)
NEUTROS ABS: 15 10*3/uL — AB (ref 1.7–7.7)
Neutrophils Relative %: 85 %
Platelets: 254 10*3/uL (ref 150–400)
RBC: 4.15 MIL/uL — AB (ref 4.22–5.81)
RDW: 12.7 % (ref 11.5–15.5)
WBC: 17.6 10*3/uL — AB (ref 4.0–10.5)

## 2015-12-10 LAB — BASIC METABOLIC PANEL
ANION GAP: 7 (ref 5–15)
BUN: 14 mg/dL (ref 6–20)
CALCIUM: 9 mg/dL (ref 8.9–10.3)
CO2: 27 mmol/L (ref 22–32)
Chloride: 96 mmol/L — ABNORMAL LOW (ref 101–111)
Creatinine, Ser: 0.83 mg/dL (ref 0.61–1.24)
GFR calc Af Amer: >60 mL/min (ref >60)
GFR calc non Af Amer: >60 mL/min (ref >60)
GLUCOSE: 123 mg/dL — AB (ref 65–99)
Potassium: 4.1 mmol/L (ref 3.5–5.1)
Sodium: 130 mmol/L — ABNORMAL LOW (ref 135–145)

## 2015-12-10 LAB — LIPASE, BLOOD: Lipase: 20 U/L (ref 11–51)

## 2015-12-10 LAB — TROPONIN I: Troponin I: <0.03 ng/mL (ref <0.031)

## 2015-12-10 MED ORDER — ONDANSETRON HCL 4 MG PO TABS: 4.0000 mg | ORAL_TABLET | Freq: Three times a day (TID) | ORAL | Status: DC | PRN

## 2015-12-10 MED ORDER — AMOXICILLIN-POT CLAVULANATE 875-125 MG PO TABS: 1.0000 | ORAL_TABLET | Freq: Two times a day (BID) | ORAL | Status: DC

## 2015-12-10 MED ORDER — ONDANSETRON HCL 4 MG/2ML IJ SOLN
4.0000 mg | Freq: Once | INTRAMUSCULAR | Status: AC
  Administered 2015-12-10: 4 mg via INTRAVENOUS
  Filled 2015-12-10: qty 2

## 2015-12-10 MED ORDER — PANTOPRAZOLE SODIUM 40 MG IV SOLR
40.0000 mg | Freq: Once | INTRAVENOUS | Status: AC
  Administered 2015-12-10: 40 mg via INTRAVENOUS
  Filled 2015-12-10: qty 40

## 2015-12-10 MED ORDER — DEXTROSE 5 % IV SOLN
1.0000 g | Freq: Once | INTRAVENOUS | Status: AC
  Administered 2015-12-10: 1 g via INTRAVENOUS
  Filled 2015-12-10: qty 10

## 2015-12-10 MED ORDER — SODIUM CHLORIDE 0.9 % IV BOLUS (SEPSIS)
500.0000 mL | Freq: Once | INTRAVENOUS | Status: AC
  Administered 2015-12-10: 500 mL via INTRAVENOUS

## 2015-12-10 NOTE — ED Notes (Signed)
Pt's wife called and spoke with [place]. She was concerned about patient's LT hand. States he was scratched by a cat and hand had become red and swollen. Dr. Audie Pinto notified.

## 2015-12-10 NOTE — Discharge Instructions (Signed)
Nausea, Adult Nausea means you feel sick to your stomach or need to throw up (vomit). It may be a sign of a more serious problem. If nausea gets worse, you may throw up. If you throw up a lot, you may lose too much body fluid (dehydration). HOME CARE   Get plenty of rest.  Ask your doctor how to replace body fluid losses (rehydrate).  Eat small amounts of food. Sip liquids more often.  Take all medicines as told by your doctor. GET HELP RIGHT AWAY IF:  You have a fever.  You pass out (faint).  You keep throwing up or have blood in your throw up.  You are very weak, have dry lips or a dry mouth, or you are very thirsty (dehydrated).  You have dark or bloody poop (stool).  You have very bad chest or belly (abdominal) pain.  You do not get better after 2 days, or you get worse.  You have a headache. MAKE SURE YOU:  Understand these instructions.  Will watch your condition.  Will get help right away if you are not doing well or get worse.   This information is not intended to replace advice given to you by your health care provider. Make sure you discuss any questions you have with your health care provider.   Document Released: 06/27/2011 Document Revised: 09/30/2011 Document Reviewed: 06/27/2011 Elsevier Interactive Patient Education 2016 [employer]. Conservator, museum/gallery bites can range from mild to serious. An animal bite can result in a scratch on the skin, a deep open cut, a puncture of the skin, a crush injury, or tearing away of the skin or a body part. A small bite from a house pet will usually not cause serious problems. However, some animal bites can become infected or injure a bone or other tissue.  Bites from certain animals can be more dangerous because of the risk of spreading rabies, which is a serious viral infection. This risk is higher with bites from stray animals or wild animals, such as raccoons, foxes, skunks, and bats. Dogs are responsible for most animal  bites. Children are bitten more often than adults. SYMPTOMS  Common symptoms of an animal bite include:   Pain.   Bleeding.   Swelling.   Bruising.  DIAGNOSIS  This condition may be diagnosed based on a physical exam and medical history. Your health care provider will examine the wound and ask for details about the animal and how the bite happened. You may also have tests, such as:   Blood tests to check for infection or to determine if surgery is needed.  X-rays to check for damage to bones or joints.  Culture test. This uses a sample of fluid from the wound to check for infection. TREATMENT  Treatment varies depending on the location and type of animal bite and your medical history. Treatment may include:   Wound care. This often includes cleaning the wound, flushing the wound with saline solution, and applying a bandage (dressing). Sometimes, the wound is left open to heal because of the high risk of infection. However, in some cases, the wound may be closed with stitches (sutures), staples, skin glue, or adhesive strips.   Antibiotic medicine.   Tetanus shot.   Rabies treatment if the animal could have rabies.  In some cases, bites that have become infected may require IV antibiotics and surgical treatment in the hospital.  HOME CARE INSTRUCTIONS Wound Care  Follow instructions from your health care provider about  how to take care of your wound. Make sure you:  Wash your hands with soap and water before you change your dressing. If soap and water are not available, use hand sanitizer.  Change your dressing as told by your health care provider.  Leave sutures, skin glue, or adhesive strips in place. These skin closures may need to be in place for 2 weeks or longer. If adhesive strip edges start to loosen and curl up, you may trim the loose edges. Do not remove adhesive strips completely unless your health care provider tells you to do that.  Check your wound every  day for signs of infection. Watch for:   Increasing redness, swelling, or pain.   Fluid, blood, or pus.  General Instructions  Take or apply over-the-counter and prescription medicines only as told by your health care provider.   If you were prescribed an antibiotic, take or apply it as told by your health care provider. Do not stop using the antibiotic even if your condition improves.   Keep the injured area raised (elevated) above the level of your heart while you are sitting or lying down, if this is possible.   If directed, apply ice to the injured area.   Put ice in a plastic bag.   Place a towel between your skin and the bag.   Leave the ice on for 20 minutes, 2-3 times per day.   Keep all follow-up visits as told by your health care provider. This is important.  SEEK MEDICAL CARE IF:  You have increasing redness, swelling, or pain at the site of your wound.   You have a general feeling of sickness (malaise).   You feel nauseous or you vomit.   You have pain that does not get better.  SEEK IMMEDIATE MEDICAL CARE IF:  You have a red streak extending away from your wound.   You have fluid, blood, or pus coming from your wound.   You have a fever or chills.   You have trouble moving your injured area.   You have numbness or tingling extending beyond the wound.   This information is not intended to replace advice given to you by your health care provider. Make sure you discuss any questions you have with your health care provider.   Document Released: 03/26/2011 Document Revised: 03/29/2015 Document Reviewed: 11/23/2014 Elsevier Interactive Patient Education [employer].

## 2015-12-10 NOTE — ED Notes (Signed)
Pt waiting in room for son-in-law to pick up patient. Pt given dinner meal tray.

## 2015-12-10 NOTE — ED Notes (Signed)
Pt states he has been very nauseated since 11am with vomiting x 4.  Denies pain but pt is visibly short of breath and pale at triage.

## 2015-12-10 NOTE — ED Provider Notes (Signed)
CSN: [id number]     Arrival date & time 12/10/15  1521 History   First MD Initiated Contact with Patient 12/10/15 1533     Chief Complaint  Patient presents with  . Nausea      HPI  Expand All Collapse All   Pt states he has been very nauseated since 11am with vomiting x 4. Patient states his cat had scratched or possibly bit him on the left hand a few days ago.  It is red and swollen.  He denies any fever.  He states the cat's shots are up-to-date.  Denies any abdominal pain.  Denies shortness of breath.          Past Medical History  Diagnosis Date  . COPD (chronic obstructive pulmonary disease) ([place])   . Sleep apnea   . GERD (gastroesophageal reflux disease)   . Hyperlipidemia   . Arthritis   . Skin cancer   . Hypertension   . Hypothyroidism   . Depression    Past Surgical History  Procedure Laterality Date  . Skin cancer excision  2013  . Prostate surgery    . Neck surgery    . Skin cancer removed      x3   Family History  Problem Relation Age of Onset  . Diabetes Mother   . Heart disease Mother   . Liver cancer Brother   . Bone cancer Sister   . Asthma Father    Social History  Substance Use Topics  . Smoking status: Former Smoker -- 2.00 packs/day for 30 years    Types: Cigarettes    Quit date: 07/23/2003  . Smokeless tobacco: Never Used  . Alcohol Use: No     Comment: quit 2005    Review of Systems  Constitutional: Negative for fever and chills.  Gastrointestinal: Positive for nausea and vomiting. Negative for abdominal pain, diarrhea, constipation, blood in stool and abdominal distention.  All other systems reviewed and are negative.     Allergies  Ciprofloxacin and Codeine  Home Medications   Prior to Admission medications   Medication Sig Start Date End Date Taking? Authorizing Provider  albuterol (PROVENTIL HFA;VENTOLIN HFA) 108 (90 BASE) MCG/ACT inhaler Inhale 2 puffs into the lungs every 6 (six) hours as needed for wheezing or  shortness of breath. 06/19/15  Yes Chesley Mires, MD  albuterol (PROVENTIL) (2.5 MG/3ML) 0.083% nebulizer solution USE 1 VIAL (3 ML) VIA NEBULIZER DAILY 11/13/15  Yes Chesley Mires, MD  aspirin EC 81 MG tablet Take 81 mg by mouth daily.   Yes Historical Provider, MD  cetirizine (ZYRTEC) 10 MG tablet Take 10 mg by mouth daily.   Yes Historical Provider, MD  citalopram (CELEXA) 20 MG tablet Take 20 mg by mouth daily.   Yes Historical Provider, MD  docusate sodium (COLACE) 100 MG capsule Take 100 mg by mouth daily.    Yes Historical Provider, MD  Fluticasone-Salmeterol (ADVAIR) 250-50 MCG/DOSE AEPB Inhale 1 puff into the lungs 2 (two) times daily. 07/07/15  Yes Chesley Mires, MD  levothyroxine (SYNTHROID, LEVOTHROID) 75 MCG tablet Take 75 mcg by mouth daily before breakfast.   Yes Historical Provider, MD  mometasone (NASONEX) 50 MCG/ACT nasal spray Place 2 sprays into the nose daily. 06/19/15  Yes Chesley Mires, MD  Multiple Vitamin (MULTIVITAMIN WITH MINERALS) TABS tablet Take 1 tablet by mouth daily.   Yes Historical Provider, MD  naproxen (NAPROSYN) 500 MG tablet Take 1 tablet (500 mg total) by mouth 2 (two) times daily with a  meal. 08/31/14  Yes Tammy Triplett, PA-C  naproxen (NAPROSYN) 500 MG tablet Take 500 mg by mouth 2 (two) times daily with a meal.   Yes Historical Provider, MD  pravastatin (PRAVACHOL) 20 MG tablet Take 20 mg by mouth daily.   Yes Historical Provider, MD  tiotropium (SPIRIVA) 18 MCG inhalation capsule Place 1 capsule (18 mcg total) into inhaler and inhale daily. 10/16/15  Yes Chesley Mires, MD  valsartan (DIOVAN) 160 MG tablet Take 160 mg by mouth 2 (two) times daily.    Yes Historical Provider, MD  amoxicillin-clavulanate (AUGMENTIN) 875-125 MG tablet Take 1 tablet by mouth 2 (two) times daily. 12/10/15   Leonard Schwartz, MD  montelukast (SINGULAIR) 10 MG tablet Take 1 tablet (10 mg total) by mouth at bedtime. 06/19/15   Chesley Mires, MD  ondansetron (ZOFRAN) 4 MG tablet Take 1 tablet (4 mg  total) by mouth every 8 (eight) hours as needed for nausea or vomiting. 12/10/15   Leonard Schwartz, MD   BP 127/70 mmHg  Pulse 79  Temp(Src) 100.7 F (38.2 C) (Oral)  Resp 24  Ht 5\' 8"  (1.727 m)  Wt 234 lb (106.142 kg)  BMI 35.59 kg/m2  SpO2 91% Physical Exam  Constitutional: He is oriented to person, place, and time. He appears well-developed and well-nourished. No distress.  HENT:  Head: Normocephalic and atraumatic.  Eyes: Pupils are equal, round, and reactive to light.  Neck: Normal range of motion.  Cardiovascular: Normal rate and intact distal pulses.   Pulmonary/Chest: No respiratory distress.  Abdominal: Normal appearance. He exhibits no distension.  Musculoskeletal: Normal range of motion.       Hands: Neurological: He is alert and oriented to person, place, and time. No cranial nerve deficit.  Skin: Skin is warm and dry. No rash noted.  Psychiatric: He has a normal mood and affect. His behavior is normal.  Nursing note and vitals reviewed.   ED Course  Procedures (including critical care time) Medications  sodium chloride 0.9 % bolus 500 mL (0 mLs Intravenous Stopped 12/10/15 1643)  ondansetron (ZOFRAN) injection 4 mg (4 mg Intravenous Given 12/10/15 1618)  pantoprazole (PROTONIX) injection 40 mg (40 mg Intravenous Given 12/10/15 1618)  cefTRIAXone (ROCEPHIN) 1 g in dextrose 5 % 50 mL IVPB (0 g Intravenous Stopped 12/10/15 1822)    Medications  sodium chloride 0.9 % bolus 500 mL (0 mLs Intravenous Stopped 12/10/15 1643)  ondansetron (ZOFRAN) injection 4 mg (4 mg Intravenous Given 12/10/15 1618)  pantoprazole (PROTONIX) injection 40 mg (40 mg Intravenous Given 12/10/15 1618)  cefTRIAXone (ROCEPHIN) 1 g in dextrose 5 % 50 mL IVPB (0 g Intravenous Stopped 12/10/15 1822)    Labs Review Labs Reviewed  CBC WITH DIFFERENTIAL/PLATELET - Abnormal; Notable for the following:    WBC 17.6 (*)    RBC 4.15 (*)    HCT 38.4 (*)    Neutro Abs 15.0 (*)    Monocytes Absolute 1.2 (*)      All other components within normal limits  BASIC METABOLIC PANEL - Abnormal; Notable for the following:    Sodium 130 (*)    Chloride 96 (*)    Glucose, Bld 123 (*)    All other components within normal limits  LIPASE, BLOOD  TROPONIN I    Imaging Review Dg Abd Acute W/chest  12/10/2015  CLINICAL DATA:  81 year old presenting with acute onset of nausea and vomiting which began earlier today. EXAM: DG ABDOMEN ACUTE W/ 1V CHEST COMPARISON:  No prior abdomen imaging.  Chest x-ray 06/08/2014. FINDINGS: Bowel gas pattern unremarkable without evidence of obstruction or significant ileus. No evidence of free air or significant air-fluid levels on the erect image. Very large stool burden in the colon. No visible opaque urinary tract calculi. Degenerative changes throughout the thoracic and lumbar spine with thoracic dextroscoliosis and compensatory lumbar levoscoliosis. Cardiac silhouette upper normal in size to slightly enlarged for AP technique. Mild chronic elevation of the right hemidiaphragm. Lungs clear. Bronchovascular markings normal. No visible pleural effusions. IMPRESSION: 1. No acute abdominal abnormality.  Very large colonic stool burden. 2. Borderline heart size.  No acute cardiopulmonary disease. Electronically Signed   By: Evangeline Dakin M.D.   On: 12/10/2015 17:00   I have personally reviewed and evaluated these images and lab results as part of my medical decision-making.  After treatment in the ED the patient feels back to baseline and wants to go home.  MDM   Final diagnoses:  Cat scratch  Non-intractable vomiting with nausea, vomiting of unspecified type        Leonard Schwartz, MD 12/10/15 [id number]

## 2015-12-10 NOTE — ED Notes (Signed)
Called patient's wife to arrange transportation. States she is sending a family member. Also went over d/c instructions with wife.

## 2015-12-10 NOTE — ED Notes (Signed)
Pt given graham crackers and water with MD approval.

## 2015-12-28 DIAGNOSIS — W5503XA Scratched by cat, initial encounter: Secondary | ICD-10-CM | POA: Diagnosis not present

## 2015-12-28 DIAGNOSIS — L089 Local infection of the skin and subcutaneous tissue, unspecified: Secondary | ICD-10-CM | POA: Diagnosis not present

## 2015-12-28 DIAGNOSIS — R5383 Other fatigue: Secondary | ICD-10-CM | POA: Diagnosis not present

## 2015-12-28 DIAGNOSIS — M7989 Other specified soft tissue disorders: Secondary | ICD-10-CM | POA: Diagnosis not present

## 2016-01-02 DIAGNOSIS — H2513 Age-related nuclear cataract, bilateral: Secondary | ICD-10-CM | POA: Diagnosis not present

## 2016-01-02 DIAGNOSIS — H2512 Age-related nuclear cataract, left eye: Secondary | ICD-10-CM | POA: Diagnosis not present

## 2016-01-10 DIAGNOSIS — B351 Tinea unguium: Secondary | ICD-10-CM | POA: Diagnosis not present

## 2016-01-10 DIAGNOSIS — M79672 Pain in left foot: Secondary | ICD-10-CM | POA: Diagnosis not present

## 2016-01-10 DIAGNOSIS — M79671 Pain in right foot: Secondary | ICD-10-CM | POA: Diagnosis not present

## 2016-01-11 DIAGNOSIS — M542 Cervicalgia: Secondary | ICD-10-CM | POA: Diagnosis not present

## 2016-01-11 DIAGNOSIS — G894 Chronic pain syndrome: Secondary | ICD-10-CM | POA: Diagnosis not present

## 2016-01-11 DIAGNOSIS — M545 Low back pain: Secondary | ICD-10-CM | POA: Diagnosis not present

## 2016-01-11 DIAGNOSIS — Z79891 Long term (current) use of opiate analgesic: Secondary | ICD-10-CM | POA: Diagnosis not present

## 2016-01-11 DIAGNOSIS — M5432 Sciatica, left side: Secondary | ICD-10-CM | POA: Diagnosis not present

## 2016-01-11 DIAGNOSIS — M5417 Radiculopathy, lumbosacral region: Secondary | ICD-10-CM | POA: Diagnosis not present

## 2016-01-12 ENCOUNTER — Other Ambulatory Visit: Payer: Self-pay | Admitting: Pulmonary Disease

## 2016-01-12 MED ORDER — MOMETASONE FUROATE 50 MCG/ACT NA SUSP
2.0000 | Freq: Every day | NASAL | Status: DC
Start: 2016-01-12 — End: 2016-03-19

## 2016-01-15 ENCOUNTER — Telehealth: Payer: Self-pay | Admitting: Pulmonary Disease

## 2016-01-15 MED ORDER — VALSARTAN 160 MG PO TABS: 160.0000 mg | ORAL_TABLET | Freq: Two times a day (BID) | ORAL | Status: DC

## 2016-01-15 MED ORDER — TIOTROPIUM BROMIDE MONOHYDRATE 18 MCG IN CAPS: 18.0000 ug | ORAL_CAPSULE | Freq: Every day | RESPIRATORY_TRACT | Status: DC

## 2016-01-15 MED ORDER — MONTELUKAST SODIUM 10 MG PO TABS: 10.0000 mg | ORAL_TABLET | Freq: Every day | ORAL | Status: DC

## 2016-01-15 NOTE — Telephone Encounter (Signed)
[name] calling to get refills on Spiriva, Diovan and Singulair Rx sent to [facility] per request. Called and left detailed message on voicemail advising patient that Rx has been sent to [facility].

## 2016-01-16 DIAGNOSIS — F4320 Adjustment disorder, unspecified: Secondary | ICD-10-CM | POA: Diagnosis not present

## 2016-01-30 DIAGNOSIS — Z85828 Personal history of other malignant neoplasm of skin: Secondary | ICD-10-CM | POA: Diagnosis not present

## 2016-01-30 DIAGNOSIS — D485 Neoplasm of uncertain behavior of skin: Secondary | ICD-10-CM | POA: Diagnosis not present

## 2016-01-30 DIAGNOSIS — L821 Other seborrheic keratosis: Secondary | ICD-10-CM | POA: Diagnosis not present

## 2016-01-30 DIAGNOSIS — Z85820 Personal history of malignant melanoma of skin: Secondary | ICD-10-CM | POA: Diagnosis not present

## 2016-01-30 DIAGNOSIS — L814 Other melanin hyperpigmentation: Secondary | ICD-10-CM | POA: Diagnosis not present

## 2016-01-30 DIAGNOSIS — L570 Actinic keratosis: Secondary | ICD-10-CM | POA: Diagnosis not present

## 2016-01-30 DIAGNOSIS — B078 Other viral warts: Secondary | ICD-10-CM | POA: Diagnosis not present

## 2016-01-31 ENCOUNTER — Telehealth: Payer: Self-pay | Admitting: Pulmonary Disease

## 2016-01-31 MED ORDER — MONTELUKAST SODIUM 10 MG PO TABS: 10.0000 mg | ORAL_TABLET | Freq: Every day | ORAL | Status: DC

## 2016-01-31 NOTE — Telephone Encounter (Signed)
Called and spoke with pts wife and she is aware of the refill of singulair that has been sent to the mail order pharmacy.  She will call back with the name of the other medication that needs to be refilled.

## 2016-02-05 NOTE — Patient Instructions (Signed)
Your procedure is scheduled on: 02/13/2016  Report to [facility] at  30   AM.  Call this number if you have problems the morning of surgery: [phone]   Do not eat food or drink liquids :After Midnight.      Take these medicines the morning of surgery with A SIP OF WATER: celexa, dilaudid, synthyroid, singulair, prilosec, zofran.   Do not wear jewelry, make-up or nail polish.  Do not wear lotions, powders, or perfumes. You may wear deodorant.  Do not shave 48 hours prior to surgery.  Do not bring valuables to the hospital.  Contacts, dentures or bridgework may not be worn into surgery.  Leave suitcase in the car. After surgery it may be brought to your room.  For patients admitted to the hospital, checkout time is 11:00 AM the day of discharge.   Patients discharged the day of surgery will not be allowed to drive home.  :     Please read over the following fact sheets that you were given: Coughing and Deep Breathing, Surgical Site Infection Prevention, Anesthesia Post-op Instructions and Care and Recovery After Surgery    Cataract A cataract is a clouding of the lens of the eye. When a lens becomes cloudy, vision is reduced based on the degree and nature of the clouding. Many cataracts reduce vision to some degree. Some cataracts make people more near-sighted as they develop. Other cataracts increase glare. Cataracts that are ignored and become worse can sometimes look white. The white color can be seen through the pupil. CAUSES   Aging. However, cataracts may occur at any age, even in newborns.   Certain drugs.   Trauma to the eye.   Certain diseases such as diabetes.   Specific eye diseases such as chronic inflammation inside the eye or a sudden attack of a rare form of glaucoma.   Inherited or acquired medical problems.  SYMPTOMS   Gradual, progressive drop in vision in the affected eye.   Severe, rapid visual loss. This most often happens when trauma is the cause.    DIAGNOSIS  To detect a cataract, an eye doctor examines the lens. Cataracts are best diagnosed with an exam of the eyes with the pupils enlarged (dilated) by drops.  TREATMENT  For an early cataract, vision may improve by using different eyeglasses or stronger lighting. If that does not help your vision, surgery is the only effective treatment. A cataract needs to be surgically removed when vision loss interferes with your everyday activities, such as driving, reading, or watching TV. A cataract may also have to be removed if it prevents examination or treatment of another eye problem. Surgery removes the cloudy lens and usually replaces it with a substitute lens (intraocular lens, IOL).  At a time when both you and your doctor agree, the cataract will be surgically removed. If you have cataracts in both eyes, only one is usually removed at a time. This allows the operated eye to heal and be out of danger from any possible problems after surgery (such as infection or poor wound healing). In rare cases, a cataract may be doing damage to your eye. In these cases, your caregiver may advise surgical removal right away. The vast majority of people who have cataract surgery have better vision afterward. HOME CARE INSTRUCTIONS  If you are not planning surgery, you may be asked to do the following:  Use different eyeglasses.   Use stronger or brighter lighting.   Ask your eye  doctor about reducing your medicine dose or changing medicines if it is thought that a medicine caused your cataract. Changing medicines does not make the cataract go away on its own.   Become familiar with your surroundings. Poor vision can lead to injury. Avoid bumping into things on the affected side. You are at a higher risk for tripping or falling.   Exercise extreme care when driving or operating machinery.   Wear sunglasses if you are sensitive to bright light or experiencing problems with glare.  SEEK IMMEDIATE MEDICAL CARE  IF:   You have a worsening or sudden vision loss.   You notice redness, swelling, or increasing pain in the eye.   You have a fever.  Document Released: 07/08/2005 Document Revised: 06/27/2011 Document Reviewed: 03/01/2011 [facility] Patient Information 2012 [place].PATIENT INSTRUCTIONS POST-ANESTHESIA  IMMEDIATELY FOLLOWING SURGERY:  Do not drive or operate machinery for the first twenty four hours after surgery.  Do not make any important decisions for twenty four hours after surgery or while taking narcotic pain medications or sedatives.  If you develop intractable nausea and vomiting or a severe headache please notify your doctor immediately.  FOLLOW-UP:  Please make an appointment with your surgeon as instructed. You do not need to follow up with anesthesia unless specifically instructed to do so.  WOUND CARE INSTRUCTIONS (if applicable):  Keep a dry clean dressing on the anesthesia/puncture wound site if there is drainage.  Once the wound has quit draining you may leave it open to air.  Generally you should leave the bandage intact for twenty four hours unless there is drainage.  If the epidural site drains for more than 36-48 hours please call the anesthesia department.  QUESTIONS?:  Please feel free to call your physician or the hospital operator if you have any questions, and they will be happy to assist you.

## 2016-02-06 ENCOUNTER — Encounter (HOSPITAL_COMMUNITY)
Admission: RE | Admit: 2016-02-06 | Discharge: 2016-02-06 | Disposition: A | Discharge status: Home / Self Care | Payer: Medicare Other | Source: Ambulatory Visit | Attending: Ophthalmology | Admitting: Ophthalmology

## 2016-02-06 ENCOUNTER — Encounter (HOSPITAL_COMMUNITY): Payer: Self-pay

## 2016-02-06 DIAGNOSIS — K219 Gastro-esophageal reflux disease without esophagitis: Secondary | ICD-10-CM | POA: Diagnosis not present

## 2016-02-06 DIAGNOSIS — Z01812 Encounter for preprocedural laboratory examination: Secondary | ICD-10-CM | POA: Diagnosis not present

## 2016-02-06 DIAGNOSIS — E785 Hyperlipidemia, unspecified: Secondary | ICD-10-CM | POA: Insufficient documentation

## 2016-02-06 DIAGNOSIS — G4733 Obstructive sleep apnea (adult) (pediatric): Secondary | ICD-10-CM | POA: Insufficient documentation

## 2016-02-06 DIAGNOSIS — I10 Essential (primary) hypertension: Secondary | ICD-10-CM | POA: Insufficient documentation

## 2016-02-06 HISTORY — DX: Other chronic pain: G89.29

## 2016-02-06 HISTORY — DX: Dorsalgia, unspecified: M54.9

## 2016-02-06 LAB — BASIC METABOLIC PANEL
ANION GAP: 6 (ref 5–15)
BUN: 8 mg/dL (ref 6–20)
CALCIUM: 8.9 mg/dL (ref 8.9–10.3)
CO2: 28 mmol/L (ref 22–32)
Chloride: 98 mmol/L — ABNORMAL LOW (ref 101–111)
Creatinine, Ser: 0.76 mg/dL (ref 0.61–1.24)
Glucose, Bld: 97 mg/dL (ref 65–99)
Potassium: 4.2 mmol/L (ref 3.5–5.1)
SODIUM: 132 mmol/L — AB (ref 135–145)

## 2016-02-06 LAB — CBC WITH DIFFERENTIAL/PLATELET
BASOS ABS: 0 10*3/uL (ref 0.0–0.1)
BASOS PCT: 0 %
EOS PCT: 6 %
Eosinophils Absolute: 0.4 10*3/uL (ref 0.0–0.7)
HCT: 38.4 % — ABNORMAL LOW (ref 39.0–52.0)
Hemoglobin: 13.2 g/dL (ref 13.0–17.0)
Lymphocytes Relative: 20 %
Lymphs Abs: 1.3 10*3/uL (ref 0.7–4.0)
MCH: 31.7 pg (ref 26.0–34.0)
MCHC: 34.4 g/dL (ref 30.0–36.0)
MCV: 92.3 fL (ref 78.0–100.0)
MONO ABS: 1.1 10*3/uL — AB (ref 0.1–1.0)
Monocytes Relative: 17 %
Neutro Abs: 3.5 10*3/uL (ref 1.7–7.7)
Neutrophils Relative %: 57 %
PLATELETS: 266 10*3/uL (ref 150–400)
RBC: 4.16 MIL/uL — AB (ref 4.22–5.81)
RDW: 13.1 % (ref 11.5–15.5)
WBC: 6.2 10*3/uL (ref 4.0–10.5)

## 2016-02-06 NOTE — Pre-Procedure Instructions (Signed)
Patient given inforamtion to sign up for my chart at home. 

## 2016-02-08 DIAGNOSIS — Z79891 Long term (current) use of opiate analgesic: Secondary | ICD-10-CM | POA: Diagnosis not present

## 2016-02-09 DIAGNOSIS — M79672 Pain in left foot: Secondary | ICD-10-CM | POA: Diagnosis not present

## 2016-02-09 DIAGNOSIS — B351 Tinea unguium: Secondary | ICD-10-CM | POA: Diagnosis not present

## 2016-02-09 DIAGNOSIS — M79671 Pain in right foot: Secondary | ICD-10-CM | POA: Diagnosis not present

## 2016-02-13 ENCOUNTER — Encounter (HOSPITAL_COMMUNITY)
Admission: RE | Disposition: A | Discharge status: Home / Self Care | Payer: Self-pay | Source: Ambulatory Visit | Attending: Ophthalmology

## 2016-02-13 ENCOUNTER — Ambulatory Visit (HOSPITAL_COMMUNITY): Payer: Medicare Other | Admitting: Anesthesiology

## 2016-02-13 ENCOUNTER — Encounter (HOSPITAL_COMMUNITY): Payer: Self-pay | Admitting: *Deleted

## 2016-02-13 ENCOUNTER — Ambulatory Visit (HOSPITAL_COMMUNITY)
Admission: RE | Admit: 2016-02-13 | Discharge: 2016-02-13 | Disposition: A | Discharge status: Home / Self Care | Payer: Medicare Other | Source: Ambulatory Visit | Attending: Ophthalmology | Admitting: Ophthalmology

## 2016-02-13 DIAGNOSIS — R51 Headache: Secondary | ICD-10-CM | POA: Insufficient documentation

## 2016-02-13 DIAGNOSIS — Z79899 Other long term (current) drug therapy: Secondary | ICD-10-CM | POA: Diagnosis not present

## 2016-02-13 DIAGNOSIS — Z7982 Long term (current) use of aspirin: Secondary | ICD-10-CM | POA: Insufficient documentation

## 2016-02-13 DIAGNOSIS — H2511 Age-related nuclear cataract, right eye: Secondary | ICD-10-CM | POA: Diagnosis not present

## 2016-02-13 DIAGNOSIS — H269 Unspecified cataract: Secondary | ICD-10-CM | POA: Diagnosis not present

## 2016-02-13 DIAGNOSIS — Z885 Allergy status to narcotic agent status: Secondary | ICD-10-CM | POA: Insufficient documentation

## 2016-02-13 DIAGNOSIS — F419 Anxiety disorder, unspecified: Secondary | ICD-10-CM | POA: Diagnosis not present

## 2016-02-13 DIAGNOSIS — J449 Chronic obstructive pulmonary disease, unspecified: Secondary | ICD-10-CM | POA: Insufficient documentation

## 2016-02-13 DIAGNOSIS — Z881 Allergy status to other antibiotic agents status: Secondary | ICD-10-CM | POA: Insufficient documentation

## 2016-02-13 DIAGNOSIS — I10 Essential (primary) hypertension: Secondary | ICD-10-CM | POA: Diagnosis not present

## 2016-02-13 DIAGNOSIS — H2512 Age-related nuclear cataract, left eye: Secondary | ICD-10-CM | POA: Diagnosis not present

## 2016-02-13 DIAGNOSIS — G4730 Sleep apnea, unspecified: Secondary | ICD-10-CM | POA: Diagnosis not present

## 2016-02-13 DIAGNOSIS — Z87891 Personal history of nicotine dependence: Secondary | ICD-10-CM | POA: Insufficient documentation

## 2016-02-13 DIAGNOSIS — K219 Gastro-esophageal reflux disease without esophagitis: Secondary | ICD-10-CM | POA: Diagnosis not present

## 2016-02-13 DIAGNOSIS — F329 Major depressive disorder, single episode, unspecified: Secondary | ICD-10-CM | POA: Insufficient documentation

## 2016-02-13 DIAGNOSIS — M1990 Unspecified osteoarthritis, unspecified site: Secondary | ICD-10-CM | POA: Insufficient documentation

## 2016-02-13 DIAGNOSIS — E039 Hypothyroidism, unspecified: Secondary | ICD-10-CM | POA: Diagnosis not present

## 2016-02-13 DIAGNOSIS — J45909 Unspecified asthma, uncomplicated: Secondary | ICD-10-CM | POA: Insufficient documentation

## 2016-02-13 HISTORY — PX: CATARACT EXTRACTION W/PHACO: SHX586

## 2016-02-13 SURGERY — PHACOEMULSIFICATION, CATARACT, WITH IOL INSERTION
Anesthesia: Monitor Anesthesia Care | Site: Eye | Laterality: Left

## 2016-02-13 MED ORDER — LACTATED RINGERS IV SOLN
INTRAVENOUS | Status: DC
  Administered 2016-02-13: 08:00:00 via INTRAVENOUS

## 2016-02-13 MED ORDER — MIDAZOLAM HCL 2 MG/2ML IJ SOLN
INTRAMUSCULAR | Status: AC
  Filled 2016-02-13: qty 2

## 2016-02-13 MED ORDER — PHENYLEPHRINE HCL 2.5 % OP SOLN
1.0000 [drp] | OPHTHALMIC | Status: AC
  Administered 2016-02-13 (×3): 1 [drp] via OPHTHALMIC

## 2016-02-13 MED ORDER — FENTANYL CITRATE (PF) 100 MCG/2ML IJ SOLN
INTRAMUSCULAR | Status: AC
  Filled 2016-02-13: qty 2

## 2016-02-13 MED ORDER — TETRACAINE HCL 0.5 % OP SOLN
1.0000 [drp] | OPHTHALMIC | Status: AC
  Administered 2016-02-13 (×3): 1 [drp] via OPHTHALMIC

## 2016-02-13 MED ORDER — CYCLOPENTOLATE-PHENYLEPHRINE 0.2-1 % OP SOLN
1.0000 [drp] | OPHTHALMIC | Status: AC
  Administered 2016-02-13 (×3): 1 [drp] via OPHTHALMIC

## 2016-02-13 MED ORDER — BSS IO SOLN
INTRAOCULAR | Status: DC | PRN
  Administered 2016-02-13: 15 mL

## 2016-02-13 MED ORDER — FENTANYL CITRATE (PF) 100 MCG/2ML IJ SOLN
25.0000 ug | INTRAMUSCULAR | Status: DC | PRN
  Administered 2016-02-13: 25 ug via INTRAVENOUS

## 2016-02-13 MED ORDER — MIDAZOLAM HCL 2 MG/2ML IJ SOLN
1.0000 mg | INTRAMUSCULAR | Status: DC | PRN
  Administered 2016-02-13: 2 mg via INTRAVENOUS

## 2016-02-13 MED ORDER — EPINEPHRINE HCL 1 MG/ML IJ SOLN
INTRAMUSCULAR | Status: AC
  Filled 2016-02-13: qty 1

## 2016-02-13 MED ORDER — PROVISC 10 MG/ML IO SOLN
INTRAOCULAR | Status: DC | PRN
  Administered 2016-02-13: 0.85 mL via INTRAOCULAR

## 2016-02-13 MED ORDER — TETRACAINE 0.5 % OP SOLN OPTIME - NO CHARGE
OPHTHALMIC | Status: DC | PRN
  Administered 2016-02-13: 2 [drp] via OPHTHALMIC

## 2016-02-13 MED ORDER — KETOROLAC TROMETHAMINE 0.5 % OP SOLN
1.0000 [drp] | OPHTHALMIC | Status: AC
  Administered 2016-02-13 (×3): 1 [drp] via OPHTHALMIC

## 2016-02-13 MED ORDER — EPINEPHRINE HCL 1 MG/ML IJ SOLN
INTRAOCULAR | Status: DC | PRN
  Administered 2016-02-13: 500 mL

## 2016-02-13 SURGICAL SUPPLY — 10 items

## 2016-02-13 NOTE — Anesthesia Preprocedure Evaluation (Signed)
Anesthesia Evaluation  Patient identified by MRN, date of birth, ID band Patient awake    Reviewed: Allergy & Precautions, NPO status , Patient's Chart, lab work & pertinent test results  Airway Mallampati: I  TM Distance: >3 FB     Dental  (+) Edentulous Upper, Edentulous Lower   Pulmonary sleep apnea , COPD,  COPD inhaler, former smoker,    breath sounds clear to auscultation       Cardiovascular hypertension,  Rhythm:Regular Rate:Normal     Neuro/Psych PSYCHIATRIC DISORDERS Depression    GI/Hepatic GERD  ,  Endo/Other  Hypothyroidism   Renal/GU      Musculoskeletal   Abdominal   Peds  Hematology   Anesthesia Other Findings   Reproductive/Obstetrics                             Anesthesia Physical Anesthesia Plan  ASA: III  Anesthesia Plan: MAC   Post-op Pain Management:    Induction: Intravenous  Airway Management Planned: Nasal Cannula  Additional Equipment:   Intra-op Plan:   Post-operative Plan:   Informed Consent: I have reviewed the patients History and Physical, chart, labs and discussed the procedure including the risks, benefits and alternatives for the proposed anesthesia with the patient or authorized representative who has indicated his/her understanding and acceptance.     Plan Discussed with:   Anesthesia Plan Comments:         Anesthesia Quick Evaluation

## 2016-02-13 NOTE — Op Note (Addendum)
Patient brought to the operating room and prepped and draped in the usual manner.  Lid speculum inserted in left eye.  Stab incision made at the twelve o'clock position.  Provisc instilled in the anterior chamber.   A 2.4 mm. Stab incision was made temporally.  An anterior capsulotomy was done with a bent 25 gauge needle.  The nucleus was hydrodissected.  The Phaco tip was inserted in the anterior chamber and the nucleus was emulsified.  CDE was 9.23.  The cortical material was then removed with the I and A tip.  Posterior capsule was the polished.  The anterior chamber was deepened with Provisc.  A 20.5 Diopter Hoya Model 250 IOL was then inserted in the capsular bag.  Provisc was then removed with the I and A tip.  The wound was then hydrated.  Patient sent to the Recovery Room in good condition with follow up in my office.  Preoperative Diagnosis:  Nuclear Cataract OS Postoperative Diagnosis:  Same Procedure name: Kelman Phacoemulsification OS with IOL

## 2016-02-13 NOTE — H&P (Signed)
The patient was re examined and there is no change in the patients condition since the original H and P. 

## 2016-02-13 NOTE — Discharge Instructions (Signed)
°  °          [facility] Instructions °[address]- [place] [address] °    ° °1. Avoid closing eyes tightly. One often closes the eye tightly when laughing, talking, sneezing, coughing or if they feel irritated. At these times, you should be careful not to close your eyes tightly. ° °2. Instill eye drops as instructed. To instill drops in your eye, open it, look up and have someone gently pull the lower lid down and instill a couple of drops inside the lower lid. ° °3. Do not touch upper lid. ° °4. Take Advil or Tylenol for pain. ° °5. You may use either eye for near work, such as reading or sewing and you may watch television. ° °6. You may have your hair done at the beauty parlor at any time. ° °7. Wear dark glasses with or without your own glasses if you are in bright light. ° °8. Call our office at [phone] or [phone] if you have sharp pain in your eye or unusual symptoms. ° °9.  FOLLOW UP WITH DR. SHAPIRO TODAY IN HIS [place] OFFICE AT 2:45pm. ° °  °I have received a copy of the above instructions and will follow them.  ° ° ° °IF YOU ARE IN IMMEDIATE DANGER CALL 911! ° °It is important for you to keep your follow-up appointment with your physician after discharge, OR, for you /your caregiver to make a follow-up appointment with your physician / medical provider after discharge. ° °Show these instructions to the next healthcare provider you see. °PATIENT INSTRUCTIONS °POST-ANESTHESIA ° °IMMEDIATELY FOLLOWING SURGERY:  Do not drive or operate machinery for the first twenty four hours after surgery.  Do not make any important decisions for twenty four hours after surgery or while taking narcotic pain medications or sedatives.  If you develop intractable nausea and vomiting or a severe headache please notify your doctor immediately. ° °FOLLOW-UP:  Please make an appointment with your surgeon as instructed. You do not need to follow up with anesthesia unless  specifically instructed to do so. ° °WOUND CARE INSTRUCTIONS (if applicable):  Keep a dry clean dressing on the anesthesia/puncture wound site if there is drainage.  Once the wound has quit draining you may leave it open to air.  Generally you should leave the bandage intact for twenty four hours unless there is drainage.  If the epidural site drains for more than 36-48 hours please call the anesthesia department. ° °QUESTIONS?:  Please feel free to call your physician or the hospital operator if you have any questions, and they will be happy to assist you.    ° ° ° °

## 2016-02-13 NOTE — Transfer of Care (Cosign Needed)
Immediate Anesthesia Transfer of Care Note  Patient: Cristian Yang  Procedure(s) Performed: Procedure(s) with comments: CATARACT EXTRACTION PHACO AND INTRAOCULAR LENS PLACEMENT (IOC) (Left) - CDE: 9.23  Patient Location: Short Stay  Anesthesia Type:MAC  Level of Consciousness: awake  Airway & Oxygen Therapy: Patient Spontanous Breathing  Post-op Assessment: Report given to RN and Post -op Vital signs reviewed and stable  Post vital signs: Reviewed and stable  Last Vitals:  Vitals:   02/13/16 0850 02/13/16 0855  BP: (!) 142/77 (!) 150/77  Pulse:    Resp: 11 16  Temp:      Last Pain:  Vitals:   02/13/16 0802  TempSrc: Oral      Patients Stated Pain Goal: 8 ([id number])  Complications: No apparent anesthesia complications

## 2016-02-13 NOTE — Anesthesia Procedure Notes (Signed)
Procedure Name: MAC Date/Time: 02/13/2016 8:58 AM Performed by: Vista Deck Pre-anesthesia Checklist: Patient identified, Emergency Drugs available, Suction available, Timeout performed and Patient being monitored Patient Re-evaluated:Patient Re-evaluated prior to inductionOxygen Delivery Method: Nasal Cannula

## 2016-02-13 NOTE — Anesthesia Postprocedure Evaluation (Signed)
Anesthesia Post Note  Patient: Cristian Yang  Procedure(s) Performed: Procedure(s) (LRB): CATARACT EXTRACTION PHACO AND INTRAOCULAR LENS PLACEMENT (IOC) (Left)  Anesthesia Post Evaluation  Last Vitals:  Vitals:   02/13/16 0850 02/13/16 0855  BP: (!) 142/77 (!) 150/77  Pulse:    Resp: 11 16  Temp:      Last Pain:  Vitals:   02/13/16 0802  TempSrc: Oral           Anesthesia Post-op Note  Patient: Cristian Yang  Procedure(s) Performed: Procedure(s) (LRB): CATARACT EXTRACTION PHACO AND INTRAOCULAR LENS PLACEMENT (IOC) (Left)  Patient Location:  Short Stay  Anesthesia Type: MAC  Level of Consciousness: awake  Airway and Oxygen Therapy: Patient Spontanous Breathing  Post-op Pain: none  Post-op Assessment: Post-op Vital signs reviewed, Patient's Cardiovascular Status Stable, Respiratory Function Stable, Patent Airway, No signs of Nausea or vomiting and Pain level controlled  Post-op Vital Signs: Reviewed and stable  Complications: No apparent anesthesia complications        Pepper Kerrick

## 2016-02-19 ENCOUNTER — Encounter (HOSPITAL_COMMUNITY): Payer: Self-pay | Admitting: Ophthalmology

## 2016-02-27 ENCOUNTER — Telehealth: Payer: Self-pay | Admitting: Pulmonary Disease

## 2016-02-27 DIAGNOSIS — M25511 Pain in right shoulder: Secondary | ICD-10-CM | POA: Diagnosis not present

## 2016-02-27 DIAGNOSIS — G4733 Obstructive sleep apnea (adult) (pediatric): Secondary | ICD-10-CM

## 2016-02-27 DIAGNOSIS — M542 Cervicalgia: Secondary | ICD-10-CM | POA: Diagnosis not present

## 2016-02-27 NOTE — Telephone Encounter (Signed)
Pt wife cb [phone]

## 2016-02-27 NOTE — Telephone Encounter (Signed)
LM x 1 

## 2016-02-27 NOTE — Telephone Encounter (Signed)
Spoke with pt's wife and she states that pt has not gotten CPAP supplies for about 6 months. Order sent to [facility] for supplies x1 year. Nothing further needed.

## 2016-03-05 ENCOUNTER — Encounter (HOSPITAL_COMMUNITY)
Admission: RE | Admit: 2016-03-05 | Discharge: 2016-03-05 | Disposition: A | Discharge status: Home / Self Care | Payer: Medicare Other | Source: Ambulatory Visit | Attending: Ophthalmology | Admitting: Ophthalmology

## 2016-03-05 ENCOUNTER — Encounter (HOSPITAL_COMMUNITY): Payer: Self-pay

## 2016-03-07 DIAGNOSIS — Z79891 Long term (current) use of opiate analgesic: Secondary | ICD-10-CM | POA: Diagnosis not present

## 2016-03-07 DIAGNOSIS — M542 Cervicalgia: Secondary | ICD-10-CM | POA: Diagnosis not present

## 2016-03-07 DIAGNOSIS — G894 Chronic pain syndrome: Secondary | ICD-10-CM | POA: Diagnosis not present

## 2016-03-07 DIAGNOSIS — M545 Low back pain: Secondary | ICD-10-CM | POA: Diagnosis not present

## 2016-03-07 DIAGNOSIS — G8929 Other chronic pain: Secondary | ICD-10-CM | POA: Diagnosis not present

## 2016-03-08 DIAGNOSIS — M79671 Pain in right foot: Secondary | ICD-10-CM | POA: Diagnosis not present

## 2016-03-08 DIAGNOSIS — M79672 Pain in left foot: Secondary | ICD-10-CM | POA: Diagnosis not present

## 2016-03-08 DIAGNOSIS — B351 Tinea unguium: Secondary | ICD-10-CM | POA: Diagnosis not present

## 2016-03-11 ENCOUNTER — Other Ambulatory Visit: Payer: Self-pay | Admitting: Family Medicine

## 2016-03-11 ENCOUNTER — Encounter (HOSPITAL_COMMUNITY): Payer: Self-pay | Admitting: Anesthesiology

## 2016-03-11 DIAGNOSIS — M542 Cervicalgia: Secondary | ICD-10-CM

## 2016-03-11 NOTE — Anesthesia Preprocedure Evaluation (Deleted)
Anesthesia Evaluation  Patient identified by MRN, date of birth, ID band Patient awake    Reviewed: Allergy & Precautions, NPO status , Patient's Chart, lab work & pertinent test results  Airway        Dental   Pulmonary shortness of breath and with exertion, sleep apnea and Continuous Positive Airway Pressure Ventilation , COPD, former smoker,           Cardiovascular hypertension, Pt. 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   Neuro/Psych PSYCHIATRIC DISORDERS Depression Cataract OD    GI/Hepatic Neg liver ROS, GERD  Medicated and Controlled,  Endo/Other  Hypothyroidism Obesity Hyperlipidemia  Renal/GU negative Renal ROS  negative genitourinary   Musculoskeletal  (+) Arthritis , Osteoarthritis,    Abdominal   Peds  Hematology   Anesthesia Other Findings   Reproductive/Obstetrics                             Anesthesia Physical Anesthesia Plan  ASA: III  Anesthesia Plan: MAC   Post-op Pain Management:    Induction:   Airway Management Planned: Natural Airway and Nasal Cannula  Additional Equipment:   Intra-op Plan:   Post-operative Plan:   Informed Consent: I have reviewed the patients History and Physical, chart, labs and discussed the procedure including the risks, benefits and alternatives for the proposed anesthesia with the patient or authorized representative who has indicated his/her understanding and acceptance.     Plan Discussed with: Anesthesiologist, CRNA and Surgeon  Anesthesia Plan Comments:         Anesthesia Quick Evaluation

## 2016-03-12 ENCOUNTER — Encounter (HOSPITAL_COMMUNITY): Admission: RE | Payer: Self-pay | Source: Ambulatory Visit

## 2016-03-12 ENCOUNTER — Ambulatory Visit (HOSPITAL_COMMUNITY): Admission: RE | Admit: 2016-03-12 | Payer: Medicare Other | Source: Ambulatory Visit | Admitting: Ophthalmology

## 2016-03-12 SURGERY — PHACOEMULSIFICATION, CATARACT, WITH IOL INSERTION
Anesthesia: Monitor Anesthesia Care | Laterality: Right

## 2016-03-12 NOTE — Progress Notes (Signed)
Patient cancelled today's procedure due to transportation issues.

## 2016-03-19 ENCOUNTER — Encounter: Payer: Self-pay | Admitting: Pulmonary Disease

## 2016-03-19 ENCOUNTER — Ambulatory Visit (INDEPENDENT_AMBULATORY_CARE_PROVIDER_SITE_OTHER): Payer: Medicare Other | Admitting: Pulmonary Disease

## 2016-03-19 VITALS — BP 124/66 | HR 61 | Ht 68.0 in | Wt 229.8 lb

## 2016-03-19 DIAGNOSIS — J449 Chronic obstructive pulmonary disease, unspecified: Secondary | ICD-10-CM

## 2016-03-19 DIAGNOSIS — J439 Emphysema, unspecified: Secondary | ICD-10-CM | POA: Diagnosis not present

## 2016-03-19 DIAGNOSIS — G4733 Obstructive sleep apnea (adult) (pediatric): Secondary | ICD-10-CM | POA: Diagnosis not present

## 2016-03-19 MED ORDER — TIOTROPIUM BROMIDE MONOHYDRATE 18 MCG IN CAPS: 18.0000 ug | ORAL_CAPSULE | Freq: Every day | RESPIRATORY_TRACT | 0 refills | Status: DC

## 2016-03-19 MED ORDER — ALBUTEROL SULFATE HFA 108 (90 BASE) MCG/ACT IN AERS: 2.0000 | INHALATION_SPRAY | Freq: Four times a day (QID) | RESPIRATORY_TRACT | 0 refills | Status: DC | PRN

## 2016-03-19 MED ORDER — ALBUTEROL SULFATE (2.5 MG/3ML) 0.083% IN NEBU: INHALATION_SOLUTION | RESPIRATORY_TRACT | 6 refills | Status: DC

## 2016-03-19 MED ORDER — MOMETASONE FUROATE 50 MCG/ACT NA SUSP: 2.0000 | Freq: Every day | NASAL | 1 refills | Status: DC

## 2016-03-19 MED ORDER — FLUTICASONE-SALMETEROL 250-50 MCG/DOSE IN AEPB: 1.0000 | INHALATION_SPRAY | Freq: Two times a day (BID) | RESPIRATORY_TRACT | 1 refills | Status: DC

## 2016-03-19 MED ORDER — MONTELUKAST SODIUM 10 MG PO TABS: 10.0000 mg | ORAL_TABLET | Freq: Every day | ORAL | 1 refills | Status: DC

## 2016-03-19 NOTE — Patient Instructions (Signed)
Follow up in 6 months 

## 2016-03-19 NOTE — Progress Notes (Signed)
Current Outpatient Prescriptions on File Prior to Visit  Medication Sig  . aspirin EC 81 MG tablet Take 81 mg by mouth daily.  . citalopram (CELEXA) 20 MG tablet Take 20 mg by mouth daily.  Marland Kitchen docusate sodium (COLACE) 100 MG capsule Take 100 mg by mouth daily.   Marland Kitchen HYDROmorphone (DILAUDID) 2 MG tablet Take 1 tablet by mouth 2 (two) times daily as needed.  Marland Kitchen levothyroxine (SYNTHROID, LEVOTHROID) 75 MCG tablet Take 75 mcg by mouth daily before breakfast.  . Multiple Vitamin (MULTIVITAMIN WITH MINERALS) TABS tablet Take 1 tablet by mouth daily.  Marland Kitchen omeprazole (PRILOSEC) 20 MG capsule Take 20 mg by mouth daily.  . ondansetron (ZOFRAN) 4 MG tablet Take 1 tablet (4 mg total) by mouth every 8 (eight) hours as needed for nausea or vomiting.  . pravastatin (PRAVACHOL) 20 MG tablet Take 20 mg by mouth daily.  . valsartan (DIOVAN) 160 MG tablet Take 1 tablet (160 mg total) by mouth 2 (two) times daily.   No current facility-administered medications on file prior to visit.     Chief Complaint  Patient presents with  . Follow-up    Denies any current breathing issues. Pt reports that since new hose was given for CPAP machine, pressure setting seems too high or like there is too much air blowing all at once. Pt states that his hosing used to be slender and now its larger allowing more air to flow through at one. DME: [facility]    Tests PSG 09/29/02 >> AHI 12, SaO2 low 85% PFT 12/12/14 >> FEV1 1.61 (60%), FEV1% 66, TLC 5.48 (82%), DLCO 47%, no BD  Past medical history GERD, HLD, HTN, Hypothyroidism, Depression  Past surgical hx, Medications, Allergies, Family hx, Social hx all reviewed.  Vital signs BP 124/66 (BP Location: Right Arm, Cuff Size: Normal)   Pulse 61   Ht 5\' 8"  (1.727 m)   Wt 229 lb 12.8 oz (104.2 kg)   SpO2 95%   BMI 34.94 kg/m   History of Present Illness: Cristian Yang is a 81 y.o. male former smoker with COPD/emphysema and OSA.  His breathing has been okay.  Not much cough, wheeze,  or sputum.  Uses CPAP nightly w/o difficulty.  Got flu shot from PCP.  Physical Exam:  General - No distress ENT - No sinus tenderness, no oral exudate, no LAN Cardiac - s1s2 regular, no murmur Chest - No wheeze/rales/dullness Back - No focal tenderness Abd - Soft, non-tender Ext - No edema Neuro - Normal strength Skin - No rashes Psych - normal mood, and behavior   Assessment/Plan:  COPD with emphysema. - continue spiriva, advair, and prn albuterol  Obstructive sleep apnea. - He reports compliance with CPAP and benefit from therapy. - continue CPAP qhs - will get download and call him with results  Upper airway cough with post-nasal drip. - continue nasonex, singulair and zyrtec   Patient Instructions  Follow up in 6 months   Chesley Mires, MD [facility] Pager:  [phone] 03/19/2016, 12:45 PM

## 2016-03-22 ENCOUNTER — Ambulatory Visit
Admission: RE | Admit: 2016-03-22 | Discharge: 2016-03-22 | Disposition: A | Discharge status: Home / Self Care | Payer: Medicare Other | Source: Ambulatory Visit | Attending: Family Medicine | Admitting: Family Medicine

## 2016-03-22 DIAGNOSIS — M4802 Spinal stenosis, cervical region: Secondary | ICD-10-CM | POA: Diagnosis not present

## 2016-03-22 DIAGNOSIS — M542 Cervicalgia: Secondary | ICD-10-CM

## 2016-03-22 MED ORDER — GADOBENATE DIMEGLUMINE 529 MG/ML IV SOLN
20.0000 mL | Freq: Once | INTRAVENOUS | Status: AC | PRN
  Administered 2016-03-22: 20 mL via INTRAVENOUS

## 2016-04-10 DIAGNOSIS — M545 Low back pain: Secondary | ICD-10-CM | POA: Diagnosis not present

## 2016-04-10 DIAGNOSIS — M542 Cervicalgia: Secondary | ICD-10-CM | POA: Diagnosis not present

## 2016-04-10 DIAGNOSIS — G894 Chronic pain syndrome: Secondary | ICD-10-CM | POA: Diagnosis not present

## 2016-04-10 DIAGNOSIS — Z79891 Long term (current) use of opiate analgesic: Secondary | ICD-10-CM | POA: Diagnosis not present

## 2016-04-22 ENCOUNTER — Other Ambulatory Visit: Payer: Self-pay | Admitting: Pulmonary Disease

## 2016-04-22 ENCOUNTER — Telehealth: Payer: Self-pay | Admitting: Pulmonary Disease

## 2016-04-22 MED ORDER — VALSARTAN 160 MG PO TABS: 160.0000 mg | ORAL_TABLET | Freq: Two times a day (BID) | ORAL | 0 refills | Status: DC

## 2016-04-22 NOTE — Telephone Encounter (Signed)
Pt requesting 30-day supply of valsartan be sent to [facility] in [place] to get him through until his mail order rx is received.  This has been sent.  Nothing further needed.

## 2016-05-07 DIAGNOSIS — G894 Chronic pain syndrome: Secondary | ICD-10-CM | POA: Diagnosis not present

## 2016-05-07 DIAGNOSIS — Z79891 Long term (current) use of opiate analgesic: Secondary | ICD-10-CM | POA: Diagnosis not present

## 2016-05-07 DIAGNOSIS — M545 Low back pain: Secondary | ICD-10-CM | POA: Diagnosis not present

## 2016-05-07 DIAGNOSIS — M542 Cervicalgia: Secondary | ICD-10-CM | POA: Diagnosis not present

## 2016-05-14 DIAGNOSIS — J40 Bronchitis, not specified as acute or chronic: Secondary | ICD-10-CM | POA: Diagnosis not present

## 2016-05-14 DIAGNOSIS — J029 Acute pharyngitis, unspecified: Secondary | ICD-10-CM | POA: Diagnosis not present

## 2016-05-24 DIAGNOSIS — Z79891 Long term (current) use of opiate analgesic: Secondary | ICD-10-CM | POA: Diagnosis not present

## 2016-07-01 DIAGNOSIS — D485 Neoplasm of uncertain behavior of skin: Secondary | ICD-10-CM | POA: Diagnosis not present

## 2016-07-01 DIAGNOSIS — L570 Actinic keratosis: Secondary | ICD-10-CM | POA: Diagnosis not present

## 2016-07-01 DIAGNOSIS — L821 Other seborrheic keratosis: Secondary | ICD-10-CM | POA: Diagnosis not present

## 2016-07-02 DIAGNOSIS — L859 Epidermal thickening, unspecified: Secondary | ICD-10-CM | POA: Diagnosis not present

## 2016-07-17 ENCOUNTER — Telehealth: Payer: Self-pay | Admitting: Pulmonary Disease

## 2016-07-17 MED ORDER — ALBUTEROL SULFATE (2.5 MG/3ML) 0.083% IN NEBU: INHALATION_SOLUTION | RESPIRATORY_TRACT | 3 refills | Status: DC

## 2016-07-17 MED ORDER — TIOTROPIUM BROMIDE MONOHYDRATE 18 MCG IN CAPS: 18.0000 ug | ORAL_CAPSULE | Freq: Every day | RESPIRATORY_TRACT | 3 refills | Status: DC

## 2016-07-17 NOTE — Telephone Encounter (Signed)
Spoke with pt's wife Emerson Monte (dpr on file), requesting 90 day rx of spiriva and [facility]  This has been sent.  Nothing further needed.

## 2016-07-17 NOTE — Telephone Encounter (Signed)
lmtcb X1 

## 2016-07-17 NOTE — Telephone Encounter (Signed)
Also needs it to be a 90 day supply

## 2016-07-26 ENCOUNTER — Ambulatory Visit (INDEPENDENT_AMBULATORY_CARE_PROVIDER_SITE_OTHER): Payer: Medicare Other | Admitting: Internal Medicine

## 2016-07-26 VITALS — BP 152/68 | HR 64 | Temp 97.6°F | Ht 68.0 in | Wt 232.8 lb

## 2016-07-26 DIAGNOSIS — M545 Low back pain, unspecified: Secondary | ICD-10-CM

## 2016-07-26 DIAGNOSIS — I10 Essential (primary) hypertension: Secondary | ICD-10-CM

## 2016-07-26 DIAGNOSIS — G8929 Other chronic pain: Secondary | ICD-10-CM

## 2016-07-26 DIAGNOSIS — E039 Hypothyroidism, unspecified: Secondary | ICD-10-CM | POA: Diagnosis not present

## 2016-07-26 DIAGNOSIS — F329 Major depressive disorder, single episode, unspecified: Secondary | ICD-10-CM

## 2016-07-26 DIAGNOSIS — E785 Hyperlipidemia, unspecified: Secondary | ICD-10-CM

## 2016-07-26 DIAGNOSIS — F32A Depression, unspecified: Secondary | ICD-10-CM

## 2016-07-26 LAB — TSH: TSH: 2.16 mIU/L (ref 0.40–4.50)

## 2016-07-26 NOTE — Progress Notes (Signed)
[name] Progress Note  Subjective:  Cristian Yang is a 82 y.o. is here to establish care. He previously was seen at [facility] [place]. Switching because he says he had trouble getting his prescriptions.   Current concerns include wanting his thyroid levels checked and wanting referral to a new pain clinic. Had previously been seen at [facility], but patient said he would have to wait hours to be seen.   PMH: Osteoarthritis - says he was on dilaudid; pain over "entire body" but especially lower back, knees, and ankles  Chronic back pain - says had surgery to discs in back; lower back pain -- did 3 years of PT he reports and follows with Pain Management COPD - takes albuterol prn, advair, spiriva, montelukast (follows with Dr. Halford Chessman) Depression - on citalopram 20 mg GERD - prilosec 20 mg daily Hyperlipidemia - on pravastatin 20 mg daily HTN - on valsartan 160 mg BID; says pressures increase in medical setting Hypothyroidism - synthroid 75 mcg Skin cancer - melanoma on back removed OSA - uses CPAP at night Heart murmur - self-reported Mild, nonobstructive coronary disease - Followed by Dr. Debara Pickett; on aspirin 81 mg  Surgical History: Prostate surgery Cardiac catheterization - 04/2013 Anterior cervical discectomy and fusion Cataract   Health Maintenance: Tetanus shot - 10/05/2015 Pneumonia shot - 10/05/15 PSA - 2015 Colonoscopy - 2014 (reports was normal) Low dose CT scan?  Family History: Mother - diabetes, heart disease  Social: - Married to 2nd wife. Has 1 daughter from previous marriage. Moved from [place] about 2 years ago.  - On fixed income - Former smoker, quit < 15 years ago - Likes to walk but says is limited by pain   Social History   Social History  . Marital status: Married    Spouse name: N/A  . Number of children: 1  . Years of education: 7th   Occupational History  . retired    Social History Main Topics   . Smoking status: Former Smoker    Packs/day: 2.00    Years: 30.00    Types: Cigarettes    Quit date: 07/23/2003  . Smokeless tobacco: Never Used  . Alcohol use No     Comment: quit 2005  . Drug use: No  . Sexual activity: No   Other Topics Concern  . Not on file   Social History Narrative  . No narrative on file    Allergies  Allergen Reactions  . Ciprofloxacin Nausea And Vomiting  . Codeine Nausea And Vomiting    Objective: Blood pressure (!) 152/68, pulse 64, temperature 97.6 F (36.4 C), temperature source Oral, height 5\' 8"  (1.727 m), weight 232 lb 12.8 oz (105.6 kg). Body mass index is 35.4 kg/m. Constitutional: Obese male, in NAD HENT: NCAT, posterior oropharynx normal Cardiovascular: RRR, S1, S2, no m/r/g.  Pulmonary/Chest: Effort normal and breath sounds normal. No respiratory distress.  Abdominal: Soft. +BS, NT, ND, no rebound or guarding.  Musculoskeletal: Swelling of R lateral malleolus without bruising. No midline spinal tenderness.  Neurological: AOx3, no focal deficits. Gait normal.  Skin: Skin is warm and dry. No rash noted on exposed skin.  Psychiatric: Normal mood and affect.  Vitals reviewed  Assessment/Plan: Patient requesting refills of valsartan, pravastatin, citalopram, and thyroxine [facility] Says he receives other medications through his Cardiologist and Pulmonologist.   Will check TSH at patient's request.   Will request records from last PCP to see  what health maintenance items patient may be due for, e.g., low dose CT scan.   Will send referral for Pain Management clinic. Recommended tylenol/limited NSAIDs prn with food for arthritis pain and weight loss. Counseled that we do not provide dilaudid and that long-term opioid use is associated with increased risk of death. With history of spinal surgery, may do best to follow with [facility].   Follow-up in a couple months. Consider ambulatory blood pressure  monitoring for possible white coat hypertension.   Olene Floss, MD [place], PGY-2

## 2016-07-26 NOTE — Patient Instructions (Signed)
Cristian Yang,  It was nice to meet you today.  I'm going to put in the referral for the different pain specialist.  I'll call you about your thyroid results.  I'm requesting your outside records so that we can pick up from where your last treatment plan was.  Best, Dr. Ola Spurr

## 2016-07-28 ENCOUNTER — Encounter: Payer: Self-pay | Admitting: Internal Medicine

## 2016-07-28 MED ORDER — VALSARTAN 160 MG PO TABS: 160.0000 mg | ORAL_TABLET | Freq: Two times a day (BID) | ORAL | 1 refills | Status: DC

## 2016-07-28 MED ORDER — PRAVASTATIN SODIUM 20 MG PO TABS: 20.0000 mg | ORAL_TABLET | Freq: Every day | ORAL | 2 refills | Status: DC

## 2016-07-28 MED ORDER — CITALOPRAM HYDROBROMIDE 20 MG PO TABS: 20.0000 mg | ORAL_TABLET | Freq: Every day | ORAL | 1 refills | Status: DC

## 2016-07-28 MED ORDER — LEVOTHYROXINE SODIUM 75 MCG PO TABS: 75.0000 ug | ORAL_TABLET | Freq: Every day | ORAL | 1 refills | Status: DC

## 2016-07-29 ENCOUNTER — Telehealth: Payer: Self-pay | Admitting: Pulmonary Disease

## 2016-07-29 MED ORDER — TIOTROPIUM BROMIDE MONOHYDRATE 18 MCG IN CAPS: 18.0000 ug | ORAL_CAPSULE | Freq: Every day | RESPIRATORY_TRACT | 1 refills | Status: DC

## 2016-07-29 MED ORDER — ALBUTEROL SULFATE HFA 108 (90 BASE) MCG/ACT IN AERS: 2.0000 | INHALATION_SPRAY | Freq: Four times a day (QID) | RESPIRATORY_TRACT | 1 refills | Status: DC | PRN

## 2016-07-29 MED ORDER — MONTELUKAST SODIUM 10 MG PO TABS: 10.0000 mg | ORAL_TABLET | Freq: Every day | ORAL | 1 refills | Status: DC

## 2016-07-29 MED ORDER — ALBUTEROL SULFATE (2.5 MG/3ML) 0.083% IN NEBU: INHALATION_SOLUTION | RESPIRATORY_TRACT | 1 refills | Status: DC

## 2016-07-29 NOTE — Telephone Encounter (Signed)
Spoke with pt, requesting below stated refills [facility].  This has been sent.  Nothing further needed.

## 2016-07-30 ENCOUNTER — Ambulatory Visit: Payer: Medicare Other | Admitting: Family Medicine

## 2016-08-02 ENCOUNTER — Telehealth: Payer: Self-pay | Admitting: Pulmonary Disease

## 2016-08-02 NOTE — Telephone Encounter (Signed)
Spoke with pt's wife, who was very upset and states on 07-29-16 she requested albuterol neb and spiriva to be sent to pharmacy. It appears that ventolin and albuterol neb both were sent in. Pt's spouse states pt does not use ventolin and this cost them 80 dollars. Pt's spouse ask that we remove ventolin from pt's medication list, as they can not afford 80 dollars a month. I have advised pt's wife to [facility]. Pt's wife states she has left a vm and is awaiting a call back. Pt's wife states she will contact once she has heard [facility].

## 2016-08-22 DIAGNOSIS — Z79891 Long term (current) use of opiate analgesic: Secondary | ICD-10-CM | POA: Diagnosis not present

## 2016-08-22 DIAGNOSIS — I10 Essential (primary) hypertension: Secondary | ICD-10-CM | POA: Diagnosis not present

## 2016-08-22 DIAGNOSIS — M5030 Other cervical disc degeneration, unspecified cervical region: Secondary | ICD-10-CM | POA: Diagnosis not present

## 2016-08-22 DIAGNOSIS — Z79899 Other long term (current) drug therapy: Secondary | ICD-10-CM | POA: Diagnosis not present

## 2016-08-22 DIAGNOSIS — G894 Chronic pain syndrome: Secondary | ICD-10-CM | POA: Diagnosis not present

## 2016-08-22 DIAGNOSIS — M4316 Spondylolisthesis, lumbar region: Secondary | ICD-10-CM | POA: Diagnosis not present

## 2016-08-22 DIAGNOSIS — M549 Dorsalgia, unspecified: Secondary | ICD-10-CM | POA: Diagnosis not present

## 2016-08-30 ENCOUNTER — Telehealth: Payer: Self-pay | Admitting: Pulmonary Disease

## 2016-08-30 NOTE — Telephone Encounter (Signed)
Called and spoke with [facility].  Kept getting changed to different departments.  Was on the phone for a total of 26 mins when I disconnected due to still being on hold to do the PA for the albuterol nebulizer medication.  Will have to call back.

## 2016-09-20 ENCOUNTER — Ambulatory Visit (INDEPENDENT_AMBULATORY_CARE_PROVIDER_SITE_OTHER): Payer: Medicare Other | Admitting: Internal Medicine

## 2016-09-20 ENCOUNTER — Ambulatory Visit: Payer: Medicare Other | Admitting: Internal Medicine

## 2016-09-20 ENCOUNTER — Encounter: Payer: Self-pay | Admitting: Internal Medicine

## 2016-09-20 VITALS — BP 140/76 | HR 60 | Temp 98.8°F | Ht 68.0 in | Wt 234.6 lb

## 2016-09-20 DIAGNOSIS — F329 Major depressive disorder, single episode, unspecified: Secondary | ICD-10-CM

## 2016-09-20 DIAGNOSIS — J449 Chronic obstructive pulmonary disease, unspecified: Secondary | ICD-10-CM | POA: Diagnosis not present

## 2016-09-20 DIAGNOSIS — E785 Hyperlipidemia, unspecified: Secondary | ICD-10-CM

## 2016-09-20 DIAGNOSIS — E039 Hypothyroidism, unspecified: Secondary | ICD-10-CM | POA: Diagnosis not present

## 2016-09-20 DIAGNOSIS — M6740 Ganglion, unspecified site: Secondary | ICD-10-CM

## 2016-09-20 DIAGNOSIS — G2581 Restless legs syndrome: Secondary | ICD-10-CM | POA: Diagnosis not present

## 2016-09-20 DIAGNOSIS — F32A Depression, unspecified: Secondary | ICD-10-CM

## 2016-09-20 NOTE — Progress Notes (Signed)
[name] Progress Note  Subjective:  Cristian Yang is a 82 y.o. male with history of COPD, arthritis, melanoma, hypothyroidism, OSA and depression who presents for medication refills, arm cyst and concern for restless leg syndrome.  R arm cyst: - Present for last 2-3 months - Is not painful unless he bumps it against something - Says he has had cyst of dorsum of hand removed by Dermatology or Orthopedics before - Interested in having removed - No changes in grip strength ROS: No rash, no fevers  Restless Legs: - Says he rubs his legs together at night to ease cramping - Not particularly concerned about this but it bothers his wife - Happens during day, as well, but mostly at night - Drinks coffee in morning and caffeinated soda at lunchtime then only water thereafter - Takes multi-vitamin - Has noticed some swelling of his legs towards the end of the day if he has been standing a lot - Says he is very active and is always walking  Medication refills: requesting albuterol, citalopram (also on omeprazole but no history of prolonged QTc on previous EKGs), synthroid, and pravastatin  Objective: Blood pressure 140/76, pulse 60, temperature 98.8 F (37.1 C), temperature source Oral, height 5\' 8"  (1.727 m), weight 234 lb 9.6 oz (106.4 kg), SpO2 95 %. Body mass index is 35.67 kg/m. Constitutional: Obese male, in NAD Pulmonary/Chest: Effort normal and breath sounds normal. No respiratory distress.  Musculoskeletal: 1.1cm x 1.1cm firm cyst along volar aspect of R forearm. Trace LE edema at ankles.  Skin: Skin is warm and dry. No rash noted. No erythema. Well-healing skin tear of L forearm.  Psychiatric: Normal mood and affect.  Vitals reviewed  Assessment/Plan: Ganglion cyst - Stable. Will refer to Hand Surgery.   Restless leg - Advised patient to trial reduction in caffeine by eliminating lunchtime soda and seeing whether this provides any improvement.  - Deferred  screening for iron deficiency, as patient was not anemic on last CBC and had MCV of 92 - Counseled patient to stay active throughout the day   Follow-up prn.  Olene Floss, MD [place], PGY-2

## 2016-09-20 NOTE — Patient Instructions (Signed)
Mr. Massar,  I recommend trying no soda at lunch to help with leg twitching at night. It may help to keep a journal of your symptoms.  You will get a call next week about seeing a Copy.  Keep the cut on your left arm covered with a bandaid. It is fine to use neosporin if that helps you with itching.  Best, Dr. Ola Spurr

## 2016-09-21 DIAGNOSIS — G2581 Restless legs syndrome: Secondary | ICD-10-CM | POA: Insufficient documentation

## 2016-09-21 DIAGNOSIS — M6740 Ganglion, unspecified site: Secondary | ICD-10-CM | POA: Insufficient documentation

## 2016-09-21 MED ORDER — CITALOPRAM HYDROBROMIDE 20 MG PO TABS
20.0000 mg | ORAL_TABLET | Freq: Every day | ORAL | 3 refills | Status: DC
Start: 2016-09-21 — End: 2016-12-24

## 2016-09-21 MED ORDER — ALBUTEROL SULFATE (2.5 MG/3ML) 0.083% IN NEBU
INHALATION_SOLUTION | RESPIRATORY_TRACT | 2 refills | Status: DC
Start: 2016-09-21 — End: 2017-02-28

## 2016-09-21 MED ORDER — PRAVASTATIN SODIUM 20 MG PO TABS
20.0000 mg | ORAL_TABLET | Freq: Every day | ORAL | 3 refills | Status: DC
Start: 2016-09-21 — End: 2016-12-24

## 2016-09-21 MED ORDER — LEVOTHYROXINE SODIUM 75 MCG PO TABS: 75.0000 ug | ORAL_TABLET | Freq: Every day | ORAL | 3 refills | Status: DC

## 2016-09-21 NOTE — Assessment & Plan Note (Signed)
-   Advised patient to trial reduction in caffeine by eliminating lunchtime soda and seeing whether this provides any improvement.  - Deferred screening for iron deficiency, as patient was not anemic on last CBC and had MCV of 92 - Counseled patient to stay active throughout the day

## 2016-09-21 NOTE — Assessment & Plan Note (Signed)
-   Stable. Will refer to Hand Surgery.

## 2016-09-24 DIAGNOSIS — M5030 Other cervical disc degeneration, unspecified cervical region: Secondary | ICD-10-CM | POA: Diagnosis not present

## 2016-09-24 DIAGNOSIS — Z6835 Body mass index (BMI) 35.0-35.9, adult: Secondary | ICD-10-CM | POA: Diagnosis not present

## 2016-09-24 DIAGNOSIS — M4316 Spondylolisthesis, lumbar region: Secondary | ICD-10-CM | POA: Diagnosis not present

## 2016-09-24 DIAGNOSIS — G894 Chronic pain syndrome: Secondary | ICD-10-CM | POA: Diagnosis not present

## 2016-09-25 ENCOUNTER — Ambulatory Visit (INDEPENDENT_AMBULATORY_CARE_PROVIDER_SITE_OTHER): Payer: Medicare Other | Admitting: Internal Medicine

## 2016-09-25 VITALS — BP 122/68 | HR 57 | Ht 68.0 in | Wt 234.4 lb

## 2016-09-25 DIAGNOSIS — G4733 Obstructive sleep apnea (adult) (pediatric): Secondary | ICD-10-CM | POA: Diagnosis not present

## 2016-09-25 DIAGNOSIS — J449 Chronic obstructive pulmonary disease, unspecified: Secondary | ICD-10-CM

## 2016-09-25 DIAGNOSIS — E782 Mixed hyperlipidemia: Secondary | ICD-10-CM

## 2016-09-25 DIAGNOSIS — Z9989 Dependence on other enabling machines and devices: Secondary | ICD-10-CM | POA: Diagnosis not present

## 2016-09-25 DIAGNOSIS — I10 Essential (primary) hypertension: Secondary | ICD-10-CM | POA: Diagnosis not present

## 2016-09-25 NOTE — Progress Notes (Signed)
OFFICE NOTE  Chief Complaint:  No complaints  Primary Care Physician: Darci Needle, MD  HPI:  Cristian Yang Is a pleasant 82 year old male who is here today accompanied by his wife. They both moved to [place] recently from [place]. He is previously followed by a cardiologist there and a pulmonologist. He does have a significant smoking history of about 20 years of cigar smoking. He has COPD and obstructive sleep apnea as well as arthritis and a history of skin cancer. He has no history of known heart disease. He reports that last year he underwent heart catheterization in fact on the same day as his wife when she was reporting chest pain symptoms. This demonstrated mild, nonobstructive coronary disease by his report. We are requesting those records now. Currently denies any chest pain and does have mild shortness of breath with exertion.  09/25/2016  Mr. Cristian Yang was seen today in follow-up. He denies chest pain and reports stable shortness of breath with exertion. Blood pressure is well controlld. He has OSA and is compliant with treatment. He is followed by Dr. Halford Chessman with pulmonary.  PMHx:  Past Medical History:  Diagnosis Date  . Arthritis   . Chronic back pain   . COPD (chronic obstructive pulmonary disease) ([place])   . Depression   . GERD (gastroesophageal reflux disease)   . Hyperlipidemia   . Hypertension   . Hypothyroidism   . Skin cancer   . Sleep apnea     Past Surgical History:  Procedure Laterality Date  . CATARACT EXTRACTION W/PHACO Left 02/13/2016   Procedure: CATARACT EXTRACTION PHACO AND INTRAOCULAR LENS PLACEMENT (IOC);  Surgeon: Rutherford Guys, MD;  Location: [facility];  Service: Ophthalmology;  Laterality: Left;  CDE: 9.23  . NECK SURGERY     cervical disc  . PROSTATE SURGERY     TURP, laser  . SKIN CANCER EXCISION  2013  . skin cancer removed     x3    FAMHx:  Family History  Problem Relation Age of Onset  . Diabetes Mother   . Heart  disease Mother   . Liver cancer Brother   . Bone cancer Sister   . Asthma Father     SOCHx:   reports that he quit smoking about 13 years ago. His smoking use included Cigarettes. He has a 60.00 pack-year smoking history. He has never used smokeless tobacco. He reports that he does not drink alcohol or use drugs.  ALLERGIES:  Allergies  Allergen Reactions  . Ciprofloxacin Nausea And Vomiting  . Codeine Nausea And Vomiting    ROS: Pertinent items noted in HPI and remainder of comprehensive ROS otherwise negative.  HOME MEDS: Current Outpatient Prescriptions  Medication Sig Dispense Refill  . albuterol (PROVENTIL) (2.5 MG/3ML) 0.083% nebulizer solution USE 1 VIAL (3 ML) VIA NEBULIZER DAILY DX: J44.9 900 mL 2  . aspirin EC 81 MG tablet Take 81 mg by mouth daily.    . citalopram (CELEXA) 20 MG tablet Take 1 tablet (20 mg total) by mouth daily. 90 tablet 3  . docusate sodium (COLACE) 100 MG capsule Take 100 mg by mouth daily.     . Fluticasone-Salmeterol (ADVAIR) 250-50 MCG/DOSE AEPB Inhale 1 puff into the lungs 2 (two) times daily. 180 each 1  . levothyroxine (SYNTHROID, LEVOTHROID) 75 MCG tablet Take 1 tablet (75 mcg total) by mouth daily before breakfast. 90 tablet 3  . mometasone (NASONEX) 50 MCG/ACT nasal spray Place 2 sprays into the nose daily.  51 g 1  . montelukast (SINGULAIR) 10 MG tablet Take 1 tablet (10 mg total) by mouth at bedtime. 90 tablet 1  . Multiple Vitamin (MULTIVITAMIN WITH MINERALS) TABS tablet Take 1 tablet by mouth daily.    Marland Kitchen omeprazole (PRILOSEC) 20 MG capsule Take 20 mg by mouth daily.    . pravastatin (PRAVACHOL) 20 MG tablet Take 1 tablet (20 mg total) by mouth daily. 90 tablet 3  . tiotropium (SPIRIVA) 18 MCG inhalation capsule Place 1 capsule (18 mcg total) into inhaler and inhale daily. DX: J44.9 90 capsule 1  . tiZANidine (ZANAFLEX) 4 MG tablet Take 1 tablet by mouth daily.  0  . valsartan (DIOVAN) 160 MG tablet Take 1 tablet (160 mg total) by mouth 2  (two) times daily. 180 tablet 1   No current facility-administered medications for this visit.     LABS/IMAGING: No results found for this or any previous visit (from the past 48 hour(s)). No results found.  VITALS: BP 122/68   Pulse (!) 57   Ht 5\' 8"  (1.727 m)   Wt 234 lb 6.4 oz (106.3 kg)   BMI 35.64 kg/m   EXAM: General appearance: alert and no distress Neck: no carotid bruit and no JVD Lungs: diminished breath sounds bilaterally Heart: regular rate and rhythm, S1, S2 normal, no murmur, click, rub or gallop Abdomen: soft, non-tender; bowel sounds normal; no masses,  no organomegaly Extremities: extremities normal, atraumatic, no cyanosis or edema Pulses: 2+ and symmetric Skin: Skin color, texture, turgor normal. No rashes or lesions Neurologic: Grossly normal Psych: Normal  EKG: Sinus bradycardia at 57, LAFB  ASSESSMENT: 1. COPD 2. Hypertension-controlled 3. Dyslipidemia on pravastatin 4. Recent heart catheterization which did not demonstrate obstructive coronary disease  PLAN: 1.   Cristian Yang is a properly treated for his hypertension and on statin for dyslipidemia. He has COPD with relatively minimal recent findings on her catheterization and no significant obstructive coronary disease. Continue current medications plan to see him back annually or sooner as necessary.  Pixie Casino, MD, [facility] Attending Cardiologist [place] 09/25/2016, 10:35 AM

## 2016-09-25 NOTE — Patient Instructions (Signed)
Your physician wants you to follow-up in: ONE YEAR with Dr. Hilty. You will receive a reminder letter in the mail two months in advance. If you don't receive a letter, please call our office to schedule the follow-up appointment.  

## 2016-10-09 DIAGNOSIS — R2232 Localized swelling, mass and lump, left upper limb: Secondary | ICD-10-CM | POA: Diagnosis not present

## 2016-10-22 DIAGNOSIS — R2231 Localized swelling, mass and lump, right upper limb: Secondary | ICD-10-CM | POA: Diagnosis not present

## 2016-10-25 DIAGNOSIS — R2231 Localized swelling, mass and lump, right upper limb: Secondary | ICD-10-CM | POA: Diagnosis not present

## 2016-11-07 ENCOUNTER — Encounter: Payer: Self-pay | Admitting: Obstetrics and Gynecology

## 2016-11-07 ENCOUNTER — Ambulatory Visit (INDEPENDENT_AMBULATORY_CARE_PROVIDER_SITE_OTHER): Payer: Medicare Other | Admitting: Obstetrics and Gynecology

## 2016-11-07 VITALS — BP 118/70 | HR 67 | Temp 98.2°F | Wt 232.0 lb

## 2016-11-07 DIAGNOSIS — T148XXA Other injury of unspecified body region, initial encounter: Secondary | ICD-10-CM | POA: Diagnosis present

## 2016-11-07 DIAGNOSIS — W5503XA Scratched by cat, initial encounter: Secondary | ICD-10-CM | POA: Diagnosis not present

## 2016-11-07 NOTE — Patient Instructions (Signed)
Puncture Wound A puncture wound is an injury that is caused by a sharp, thin object that goes through your skin, such as a nail. A puncture wound usually does not leave a large opening in your skin, so it may not bleed a lot. However, when you get a puncture wound, dirt or other materials (foreign bodies) can be forced into your wound and break off inside. This makes it more likely that an infection will happen, such as tetanus. Follow these instructions at home: Medicines   Take or apply over-the-counter and prescription medicines only as told by your doctor.  If you were prescribed an antibiotic medicine, take or apply it as told by your doctor. Do not stop using the antibiotic even if your condition starts to get better. Wound care   There are many ways to close and cover a wound. For example, a wound can be covered with stitches (sutures), skin glue, or adhesive strips. Follow instructions from your doctor about:  How to take care of your wound.  When and how you should change your bandage (dressing).  When you should remove your bandage.  Removing whatever was used to close your wound.  Keep the bandage dry as told by your doctor. Do not take baths, swim, use a hot tub, or do anything that would put your wound underwater until your doctor says it is okay.  Clean the wound as told by your doctor.  Do not scratch or pick at the wound.  Check your wound every day for signs of infection. Watch for:  Redness, swelling, or pain.  Fluid, blood, or pus. General instructions   Raise (elevate) the injured area above the level of your heart while you are sitting or lying down.  If your puncture wound is in your foot, ask your doctor if you need to avoid putting weight on your foot and for how long.  Keep all follow-up visits as told by your doctor. This is important. Contact a doctor if:  You got a tetanus shot and you have any of these problems at the injection  site:  Swelling.  Very bad pain.  Redness.  Bleeding.  You have a fever.  Your stitches come out.  You notice a bad smell coming from your wound or your bandage.  You notice something coming out of the wound, such as wood or glass.  Medicine does not help your pain.  You have more redness, swelling, or pain at the site of your wound.  You have fluid, blood, or pus coming from your wound.  You notice a change in the color of your skin near your wound.  You need to change the bandage often because fluid, blood, or pus is coming from the wound.  You start to have a new rash.  You start to have numbness around the wound. Get help right away if:  You have very bad swelling around the wound.  Your pain suddenly gets worse and is very bad.  You start to get painful skin lumps.  You have a red streak going away from your wound.  The wound is on your hand or foot and you cannot move a finger or toe like you usually can.  The wound is on your hand or foot and you notice that your fingers or toes look pale or bluish. This information is not intended to replace advice given to you by your health care provider. Make sure you discuss any questions you have with your health   care provider. Document Released: 04/16/2008 Document Revised: 12/14/2015 Document Reviewed: 08/31/2014 Elsevier Interactive Patient Education  2017 Elsevier Inc.  

## 2016-11-07 NOTE — Progress Notes (Signed)
Subjective:     Patient ID: Cristian Yang, male   DOB: May 03, 1935, 82 y.o.   MRN: [id number]  HPI  Mr Keng presents today for a puncture wound of the left lateral palm by his cat 5 days ago as well as a 2 day history of feeling ill. Cats claw accidentally punctured palm. Cats shot records are up to date.  Patient notes feeling hot, chills, and 2 episodes of nausea and dry heaving with tenderness around injury. No symptoms have been noted today, feels as if he is getting over illness.  He used neosporin and a Band-Aid to dress wound.    Review of Systems  Constitutional: Positive for appetite change, chills and fever.  HENT: Negative for congestion, facial swelling, rhinorrhea and sore throat.   Eyes: Negative for photophobia and visual disturbance.  Respiratory: Negative for cough, chest tightness and shortness of breath.   Cardiovascular: Negative for chest pain, palpitations and leg swelling.  Skin: Positive for wound. Negative for color change.  Allergic/Immunologic: Negative for immunocompromised state.  Neurological: Negative for dizziness, light-headedness and headaches.       Objective:   Physical Exam  Constitutional: He appears well-developed. No distress.  Cardiovascular: Normal rate, regular rhythm and normal heart sounds.  Exam reveals no gallop and no friction rub.   No murmur heard. Pulmonary/Chest: Effort normal and breath sounds normal. No respiratory distress.  Skin: injury site is a 5x52mm well defined scab with no surrounding erythema. No warmth or tenderness. Lymph: No lymphadenopathy      Assessment/plan    1. Cat scratch Patient presented with history of feeling hot, cold and nausea following puncture wound by cat nail. No current symptoms or major concerns. Patient provided reassurance and given return precautions

## 2016-11-08 DIAGNOSIS — M4316 Spondylolisthesis, lumbar region: Secondary | ICD-10-CM | POA: Diagnosis not present

## 2016-11-08 DIAGNOSIS — K529 Noninfective gastroenteritis and colitis, unspecified: Secondary | ICD-10-CM | POA: Diagnosis not present

## 2016-11-08 DIAGNOSIS — G894 Chronic pain syndrome: Secondary | ICD-10-CM | POA: Diagnosis not present

## 2016-11-11 ENCOUNTER — Telehealth: Payer: Self-pay

## 2016-11-11 DIAGNOSIS — I10 Essential (primary) hypertension: Secondary | ICD-10-CM

## 2016-11-11 MED ORDER — MONTELUKAST SODIUM 10 MG PO TABS: 10.0000 mg | ORAL_TABLET | Freq: Every day | ORAL | 1 refills | Status: DC

## 2016-11-11 MED ORDER — VALSARTAN 160 MG PO TABS: 160.0000 mg | ORAL_TABLET | Freq: Two times a day (BID) | ORAL | 1 refills | Status: DC

## 2016-11-11 NOTE — Telephone Encounter (Signed)
Needs valsartan, and montelukast refilled for a 90 day supply. Please send to [facility] on [address] in [place], [place]. Ottis Stain, CMA

## 2016-11-11 NOTE — Telephone Encounter (Signed)
Placed orders as requested

## 2016-11-13 ENCOUNTER — Other Ambulatory Visit: Payer: Self-pay | Admitting: *Deleted

## 2016-11-13 NOTE — Patient Outreach (Signed)
[facility]) Care Management  11/13/2016  Smith Potenza [facility] 1935/02/15 [id number]   During a home visit with Mr Smola wife, he denied need for [facility] CM services when Mrs Bruening wanted him to be followed for services. Mr. Wickens has not had any hospitalizations, is not meeting  [facility] consult criteria and denies need for [facility] services. He reports only one recent visit to his pcp office after scratch by his cat, developed an infection and the "stomach virus" He states he was prescribed antibiotics and is now feeling better.  He was noted with some wheezing during the home visit related being kept busy by his wife to completer various errands for her at a rapid pace.  He was encouraged to rest closer to the end of the visit and was noted without distress after rest.    Joelene Millin L. Lavina Hamman, RN, BSN, [place] [facility] [phone]

## 2016-11-22 ENCOUNTER — Other Ambulatory Visit: Payer: Self-pay | Admitting: Internal Medicine

## 2016-11-22 DIAGNOSIS — E039 Hypothyroidism, unspecified: Secondary | ICD-10-CM

## 2016-11-22 MED ORDER — LEVOTHYROXINE SODIUM 75 MCG PO TABS: 75.0000 ug | ORAL_TABLET | Freq: Every day | ORAL | 3 refills | Status: DC

## 2016-11-22 NOTE — Telephone Encounter (Signed)
Pt needs refill on levothyroxin.  It needs to go to the [facility] in [place].  He is no longer dealing with  [facility] and didn't receive the medication in march

## 2016-11-26 ENCOUNTER — Telehealth: Payer: Self-pay | Admitting: Pulmonary Disease

## 2016-11-26 MED ORDER — TIOTROPIUM BROMIDE MONOHYDRATE 2.5 MCG/ACT IN AERS: 2.0000 | INHALATION_SPRAY | Freq: Every day | RESPIRATORY_TRACT | 0 refills | Status: DC

## 2016-11-26 NOTE — Telephone Encounter (Signed)
Pt assistance form in VS' cubby.  Will forward to Ashtyn to follow up on.

## 2016-11-26 NOTE — Telephone Encounter (Signed)
Samples given to patient.  Nothing further needed.

## 2016-11-27 NOTE — Telephone Encounter (Signed)
Will send to VS as these are in his look at folder.

## 2016-11-29 NOTE — Telephone Encounter (Signed)
Spoke with patient wife, aware that we are faxing back BI forms today for his Spiriva to [phone]

## 2016-12-03 NOTE — Telephone Encounter (Signed)
Cristian Yang, can this be closed? Thanks

## 2016-12-04 MED ORDER — TIOTROPIUM BROMIDE MONOHYDRATE 18 MCG IN CAPS: 18.0000 ug | ORAL_CAPSULE | Freq: Every day | RESPIRATORY_TRACT | 3 refills | Status: DC

## 2016-12-04 NOTE — Telephone Encounter (Signed)
Rx was not attached when VS signed forms initially, Forms and Rx have been faxed. Nothing further needed.

## 2016-12-06 DIAGNOSIS — M4316 Spondylolisthesis, lumbar region: Secondary | ICD-10-CM | POA: Diagnosis not present

## 2016-12-06 DIAGNOSIS — M5030 Other cervical disc degeneration, unspecified cervical region: Secondary | ICD-10-CM | POA: Diagnosis not present

## 2016-12-06 DIAGNOSIS — G894 Chronic pain syndrome: Secondary | ICD-10-CM | POA: Diagnosis not present

## 2016-12-09 ENCOUNTER — Ambulatory Visit (INDEPENDENT_AMBULATORY_CARE_PROVIDER_SITE_OTHER): Payer: Medicare Other | Admitting: Internal Medicine

## 2016-12-09 ENCOUNTER — Encounter: Payer: Self-pay | Admitting: Internal Medicine

## 2016-12-09 DIAGNOSIS — M25571 Pain in right ankle and joints of right foot: Secondary | ICD-10-CM

## 2016-12-09 NOTE — Patient Instructions (Addendum)
It was nice meeting you today Mr. Cristian Yang!  You most likely sprained your ankle and are beginning to heal. It will probably take a few more weeks before the pain will go away, but continue to do the things you are already doing to help speed up the process. Elevating your foot and staying off it if when you can will help. You can also put a heating pad on it. If you feel like the ACE bandage helped with your pain, you can continue to use that. In addition to your regular pain medication, you can take ibuprofen or naproxen to help with pain.   If the pain does not start to get better after a few more weeks, please let us know.   If you have any questions or concerns, please feel free to call the clinic.   Be well,  Dr. Avon Gully

## 2016-12-09 NOTE — Progress Notes (Signed)
   Subjective:   Patient: Cristian Yang       Birthdate: 1934-12-14       MRN: [id number]      HPI  Cristian Yang is a 82 y.o. male presenting for ankle pain.   R ankle pain Patient was chasing his cat two weeks ago when he twisted his ankle. He says his ankle has been slightly swollen, but is usually at least a little swollen, which he attributes to arthritis. Thinks now the swelling has returned to usual level. Endorses pain with walking, but has been able to continue with his daily activities and does not require assistance when walking. Pain does worsen towards end of day. He has been trying to keep his foot elevated which helps some. Has used a heating pad some and an ACE bandage. Cannot say whether these have helped or not. Describes pain as located medially and is aching in nature. He has been taking his prescribe hydrocodone which does help with pain. Has not taken any other pain meds. Also noted a bruise on his L calf that appeared after the incident but says this is not painful and is going away.   Smoking status reviewed. Patient is former smoker.   Review of Systems See HPI.     Objective:  Physical Exam  Constitutional: He is oriented to person, place, and time and well-developed, well-nourished, and in no distress.  HENT:  Head: Normocephalic and atraumatic.  Eyes: Conjunctivae and EOM are normal.  Pulmonary/Chest: Effort normal. No respiratory distress.  Musculoskeletal:  Some swelling around medial malleolus on R foot. Reports tenderness to deep palpation around medial malleolus. Some bruising in the region but appears to be healing. Full ROM. Able to walk without difficult. Bruise on L calf healing well with no tenderness to palpation.   Neurological: He is alert and oriented to person, place, and time.  Skin: Skin is warm and dry.  Psychiatric: Affect and judgment normal.      Assessment & Plan:  Acute right ankle pain Likely sustained mild ankle sprain when he  twisted his ankle. Swelling is baseline for patient per his report, and only minimal tenderness to palpation. No ROM limitations and able to walk unassisted. Discussed continuing conservative management but that sprains can take time to fully heal. Discussed that he will likely continue to have at least some pain for the next few weeks.  - Continue to elevate foot. Can keep ACE bandage and heating pad if he feels it is helpful - Can take ibuprofen in additional to home hydrocodone-acetaminophen for additional pain relief if necessary - F/u with Dr. Ola Spurr (PCP) on June 4   Adin Hector, MD, MPH PGY-2 [name] Pager [phone]

## 2016-12-09 NOTE — Assessment & Plan Note (Signed)
Likely sustained mild ankle sprain when he twisted his ankle. Swelling is baseline for patient per his report, and only minimal tenderness to palpation. No ROM limitations and able to walk unassisted. Discussed continuing conservative management but that sprains can take time to fully heal. Discussed that he will likely continue to have at least some pain for the next few weeks.  - Continue to elevate foot. Can keep ACE bandage and heating pad if he feels it is helpful - Can take ibuprofen in additional to home hydrocodone-acetaminophen for additional pain relief if necessary - F/u with Dr. Ola Spurr (PCP) on June 4

## 2016-12-20 ENCOUNTER — Telehealth: Payer: Self-pay | Admitting: Pulmonary Disease

## 2016-12-20 MED ORDER — FLUTICASONE-SALMETEROL 250-50 MCG/DOSE IN AEPB: 1.0000 | INHALATION_SPRAY | Freq: Two times a day (BID) | RESPIRATORY_TRACT | 5 refills | Status: DC

## 2016-12-20 MED ORDER — TIOTROPIUM BROMIDE MONOHYDRATE 2.5 MCG/ACT IN AERS: 2.0000 | INHALATION_SPRAY | Freq: Every day | RESPIRATORY_TRACT | 5 refills | Status: DC

## 2016-12-20 NOTE — Telephone Encounter (Signed)
Rx for adviar and spiriva have been sent to preferred pharmacy. Pt is aware and voiced his understanding. Nothing further needed.

## 2016-12-23 ENCOUNTER — Encounter: Payer: Self-pay | Admitting: Internal Medicine

## 2016-12-23 ENCOUNTER — Ambulatory Visit (INDEPENDENT_AMBULATORY_CARE_PROVIDER_SITE_OTHER): Payer: Medicare Other | Admitting: Internal Medicine

## 2016-12-23 VITALS — BP 150/80 | HR 84 | Temp 98.2°F | Ht 68.0 in | Wt 228.0 lb

## 2016-12-23 DIAGNOSIS — E785 Hyperlipidemia, unspecified: Secondary | ICD-10-CM

## 2016-12-23 DIAGNOSIS — E782 Mixed hyperlipidemia: Secondary | ICD-10-CM

## 2016-12-23 DIAGNOSIS — F32A Depression, unspecified: Secondary | ICD-10-CM

## 2016-12-23 DIAGNOSIS — M25571 Pain in right ankle and joints of right foot: Secondary | ICD-10-CM

## 2016-12-23 DIAGNOSIS — I10 Essential (primary) hypertension: Secondary | ICD-10-CM | POA: Diagnosis not present

## 2016-12-23 DIAGNOSIS — Z760 Encounter for issue of repeat prescription: Secondary | ICD-10-CM | POA: Diagnosis present

## 2016-12-23 DIAGNOSIS — F329 Major depressive disorder, single episode, unspecified: Secondary | ICD-10-CM | POA: Diagnosis not present

## 2016-12-23 DIAGNOSIS — J439 Emphysema, unspecified: Secondary | ICD-10-CM | POA: Diagnosis not present

## 2016-12-23 DIAGNOSIS — E039 Hypothyroidism, unspecified: Secondary | ICD-10-CM

## 2016-12-23 DIAGNOSIS — Z23 Encounter for immunization: Secondary | ICD-10-CM

## 2016-12-23 MED ORDER — DICLOFENAC SODIUM 1 % TD GEL: 2.0000 g | Freq: Four times a day (QID) | TRANSDERMAL | 2 refills | Status: DC

## 2016-12-23 NOTE — Patient Instructions (Signed)
Mr. Leanos,  Try diclofenac (voltaren gel) on your ankle. You may also find capsaicin cream (over the counter) to provide relief--creates sensation of heat.  I will send your medications to your pharmacy.  Best, Dr. Ola Spurr

## 2016-12-24 DIAGNOSIS — Z760 Encounter for issue of repeat prescription: Secondary | ICD-10-CM | POA: Insufficient documentation

## 2016-12-24 MED ORDER — PRAVASTATIN SODIUM 20 MG PO TABS: 20.0000 mg | ORAL_TABLET | Freq: Every day | ORAL | 3 refills | Status: DC

## 2016-12-24 MED ORDER — LEVOTHYROXINE SODIUM 75 MCG PO TABS: 75.0000 ug | ORAL_TABLET | Freq: Every day | ORAL | 3 refills | Status: DC

## 2016-12-24 MED ORDER — VALSARTAN 160 MG PO TABS: 160.0000 mg | ORAL_TABLET | Freq: Two times a day (BID) | ORAL | 2 refills | Status: DC

## 2016-12-24 MED ORDER — CITALOPRAM HYDROBROMIDE 20 MG PO TABS: 20.0000 mg | ORAL_TABLET | Freq: Every day | ORAL | 3 refills | Status: DC

## 2016-12-24 NOTE — Assessment & Plan Note (Signed)
-   Improving. Does appear to have increased laxity of ankle ligaments on R. Reiterated that ankle sprains can take months to fully recover from.  - Recommended continuing to elevate to help with swelling. - Recommended applying voltaren gel. Prescribed this. If not affordable, suggested capsaicin since has had improvement in symptoms with heat. - To get replacement orthotics and new shoes. Suggested trying [employer] or similar sports store for gait analysis before buying new shoes.

## 2016-12-24 NOTE — Progress Notes (Signed)
[facility] Progress Note  Subjective:  Cristian Yang is a 82 y.o. male with history of arthritis, HTN, HLD, OSA, hypothyroidism and depression who presents for medication refills and to discuss ongoing R ankle pain.  #Medication refills - Needs pravastatin, valsartan, citalopram, levothyroxine sent to new pharmacy -- [facility]  #Ankle pain - [place] and twisted ankle about 1 month ago and still having pain - Pain and swelling has improved, though is concerned he has weak ankles. Has not been able to mow yard like he would like to.  - Has tried ACE bandage but this just travels up his leg, according to his wife - Elevation does help swelling, as does heat. Cannot tolerate icing due to arthritis.  - Plans to have orthotics remade on July 25th to help with overpronation - Soles of shoes worn down with barely any tread left ROS: No other falls, no loss of sensation  Social: Former smoker  Objective: Blood pressure (!) 150/80, pulse 84, temperature 98.2 F (36.8 C), temperature source Oral, height 5\' 8"  (1.727 m), weight 228 lb (103.4 kg), SpO2 95 %. Body mass index is 34.67 kg/m. Constitutional: Well-appearing older man, in NAD Cardiovascular: RRR, S1, S2, no m/r/g.  Pulmonary/Chest: Effort normal and breath sounds normal. No respiratory distress.  Musculoskeletal: Increased swelling over R lateral malleolus without bruising or erythema. Increased laxity with internal rotation of R ankle compared with left. Discomfort with resisted internal and external rotation of R ankle but not with dorsi or plantarflexion.  Psychiatric: Normal mood and affect.  Vitals reviewed  Assessment/Plan: Medication refill - Refilled chronic medications for HLD, depression, hypothyroidim, HTN as above  Right ankle pain - Improving. Does appear to have increased laxity of ankle ligaments on R. Reiterated that ankle sprains can take months to fully recover from.  - Recommended  continuing to elevate to help with swelling. - Recommended applying voltaren gel. Prescribed this. If not affordable, suggested capsaicin since has had improvement in symptoms with heat. - To get replacement orthotics and new shoes. Suggested trying [employer] or similar sports store for gait analysis before buying new shoes.   Follow-up in about 3 months or sooner if no improvement.  Cristian Floss, MD [place], PGY-2

## 2016-12-24 NOTE — Assessment & Plan Note (Signed)
-   Refilled chronic medications for HLD, depression, hypothyroidim, HTN as above

## 2016-12-27 DIAGNOSIS — M79672 Pain in left foot: Secondary | ICD-10-CM | POA: Diagnosis not present

## 2016-12-27 DIAGNOSIS — M25571 Pain in right ankle and joints of right foot: Secondary | ICD-10-CM | POA: Diagnosis not present

## 2016-12-27 DIAGNOSIS — B079 Viral wart, unspecified: Secondary | ICD-10-CM | POA: Diagnosis not present

## 2016-12-27 DIAGNOSIS — M24874 Other specific joint derangements of right foot, not elsewhere classified: Secondary | ICD-10-CM | POA: Diagnosis not present

## 2016-12-27 DIAGNOSIS — M722 Plantar fascial fibromatosis: Secondary | ICD-10-CM | POA: Diagnosis not present

## 2017-01-07 DIAGNOSIS — M4316 Spondylolisthesis, lumbar region: Secondary | ICD-10-CM | POA: Diagnosis not present

## 2017-01-07 DIAGNOSIS — G894 Chronic pain syndrome: Secondary | ICD-10-CM | POA: Diagnosis not present

## 2017-01-14 ENCOUNTER — Telehealth: Payer: Self-pay

## 2017-01-14 NOTE — Telephone Encounter (Signed)
Pt wife Retha Gumina came into the office and asked me to get a Handicapped Plate form to Dr. Ola Spurr to fill out for her husbands car. Please advise. Form has been put into Dr. Tyler Pita box. Ottis Stain, CMA

## 2017-01-16 DIAGNOSIS — B079 Viral wart, unspecified: Secondary | ICD-10-CM | POA: Diagnosis not present

## 2017-01-16 DIAGNOSIS — M79672 Pain in left foot: Secondary | ICD-10-CM | POA: Diagnosis not present

## 2017-01-16 NOTE — Telephone Encounter (Signed)
Called patient to clarify who needs handicap pass. [name] answered. She stated her husband needs it due to his COPD that limits how far he can walk and because he drives her (uses a scooter to get around). Let patient I would fill out paperwork and we could either send it to her or she can pick up at tentative appointment next week.

## 2017-01-17 ENCOUNTER — Telehealth: Payer: Self-pay | Admitting: Internal Medicine

## 2017-01-17 NOTE — Telephone Encounter (Signed)
[facility] [employer] called because they need a prescription for the patient Singular to be called in. They are now doing a pill box for the patient. Please send a prescription of this . jw

## 2017-01-18 MED ORDER — MONTELUKAST SODIUM 10 MG PO TABS: 10.0000 mg | ORAL_TABLET | Freq: Every day | ORAL | 3 refills | Status: DC

## 2017-01-18 NOTE — Telephone Encounter (Signed)
Placed order

## 2017-01-20 NOTE — Telephone Encounter (Signed)
Placed in Tamika's box. 

## 2017-01-21 NOTE — Telephone Encounter (Signed)
Left voice message for patient that placard form is complete and ready for pickup.  Derl Barrow, RN

## 2017-02-05 DIAGNOSIS — M5030 Other cervical disc degeneration, unspecified cervical region: Secondary | ICD-10-CM | POA: Diagnosis not present

## 2017-02-05 DIAGNOSIS — G894 Chronic pain syndrome: Secondary | ICD-10-CM | POA: Diagnosis not present

## 2017-02-05 DIAGNOSIS — Z79899 Other long term (current) drug therapy: Secondary | ICD-10-CM | POA: Diagnosis not present

## 2017-02-05 DIAGNOSIS — Z79891 Long term (current) use of opiate analgesic: Secondary | ICD-10-CM | POA: Diagnosis not present

## 2017-02-05 DIAGNOSIS — M4316 Spondylolisthesis, lumbar region: Secondary | ICD-10-CM | POA: Diagnosis not present

## 2017-02-07 ENCOUNTER — Telehealth: Payer: Self-pay | Admitting: Internal Medicine

## 2017-02-07 MED ORDER — LOSARTAN POTASSIUM 100 MG PO TABS: 100.0000 mg | ORAL_TABLET | Freq: Every day | ORAL | 3 refills | Status: DC

## 2017-02-07 NOTE — Telephone Encounter (Signed)
Pharmacist states pt's valsartan has been recalled. He can give pt the valsartan with HCTZ or try something different. Please advise. ep

## 2017-02-07 NOTE — Telephone Encounter (Signed)
Changed Rx from valsartan 160 mg qd to losartan 100 mg qd.

## 2017-02-14 ENCOUNTER — Other Ambulatory Visit: Payer: Self-pay | Admitting: *Deleted

## 2017-02-14 DIAGNOSIS — E039 Hypothyroidism, unspecified: Secondary | ICD-10-CM

## 2017-02-14 DIAGNOSIS — B079 Viral wart, unspecified: Secondary | ICD-10-CM | POA: Diagnosis not present

## 2017-02-14 DIAGNOSIS — F32A Depression, unspecified: Secondary | ICD-10-CM

## 2017-02-14 DIAGNOSIS — F329 Major depressive disorder, single episode, unspecified: Secondary | ICD-10-CM

## 2017-02-14 DIAGNOSIS — M79672 Pain in left foot: Secondary | ICD-10-CM | POA: Diagnosis not present

## 2017-02-14 MED ORDER — LEVOTHYROXINE SODIUM 75 MCG PO TABS: 75.0000 ug | ORAL_TABLET | Freq: Every day | ORAL | 3 refills | Status: DC

## 2017-02-14 MED ORDER — CITALOPRAM HYDROBROMIDE 20 MG PO TABS: 20.0000 mg | ORAL_TABLET | Freq: Every day | ORAL | 3 refills | Status: DC

## 2017-02-14 NOTE — Telephone Encounter (Signed)
Wife calling to request refill of:  Name of Medication(s):  Citalopram and levothyroxine Last date of OV:  12/23/2016 Pharmacy:  [facility]   Will route refill request to Clinic RN.  Discussed with patient policy to call pharmacy for future refills.  Also, discussed refills may take up to 48 hours to approve or deny.  Amie Cowens,  [place]

## 2017-02-28 ENCOUNTER — Ambulatory Visit (INDEPENDENT_AMBULATORY_CARE_PROVIDER_SITE_OTHER): Payer: Medicare Other | Admitting: Pulmonary Disease

## 2017-02-28 ENCOUNTER — Encounter: Payer: Self-pay | Admitting: Pulmonary Disease

## 2017-02-28 VITALS — BP 118/62 | HR 64 | Ht 68.0 in | Wt 232.8 lb

## 2017-02-28 DIAGNOSIS — G4733 Obstructive sleep apnea (adult) (pediatric): Secondary | ICD-10-CM

## 2017-02-28 DIAGNOSIS — R05 Cough: Secondary | ICD-10-CM | POA: Diagnosis not present

## 2017-02-28 DIAGNOSIS — J449 Chronic obstructive pulmonary disease, unspecified: Secondary | ICD-10-CM | POA: Diagnosis not present

## 2017-02-28 DIAGNOSIS — R058 Other specified cough: Secondary | ICD-10-CM

## 2017-02-28 MED ORDER — MOMETASONE FUROATE 50 MCG/ACT NA SUSP: 2.0000 | Freq: Every day | NASAL | 1 refills | Status: DC

## 2017-02-28 MED ORDER — TIOTROPIUM BROMIDE MONOHYDRATE 2.5 MCG/ACT IN AERS: 2.0000 | INHALATION_SPRAY | Freq: Every day | RESPIRATORY_TRACT | 5 refills | Status: DC

## 2017-02-28 MED ORDER — FLUTICASONE-SALMETEROL 250-50 MCG/DOSE IN AEPB: 1.0000 | INHALATION_SPRAY | Freq: Two times a day (BID) | RESPIRATORY_TRACT | 5 refills | Status: DC

## 2017-02-28 MED ORDER — MONTELUKAST SODIUM 10 MG PO TABS
10.0000 mg | ORAL_TABLET | Freq: Every day | ORAL | 3 refills | Status: DC
Start: 2017-02-28 — End: 2018-01-09

## 2017-02-28 MED ORDER — ALBUTEROL SULFATE (2.5 MG/3ML) 0.083% IN NEBU: INHALATION_SOLUTION | RESPIRATORY_TRACT | 2 refills | Status: DC

## 2017-02-28 MED ORDER — MONTELUKAST SODIUM 10 MG PO TABS
10.0000 mg | ORAL_TABLET | Freq: Every day | ORAL | 3 refills | Status: DC
Start: 2017-02-28 — End: 2017-02-28

## 2017-02-28 NOTE — Patient Instructions (Signed)
Follow up in 6 months 

## 2017-02-28 NOTE — Progress Notes (Signed)
Current Outpatient Prescriptions on File Prior to Visit  Medication Sig  . aspirin EC 81 MG tablet Take 81 mg by mouth daily.  . citalopram (CELEXA) 20 MG tablet Take 1 tablet (20 mg total) by mouth daily.  . diclofenac sodium (VOLTAREN) 1 % GEL Apply 2 g topically 4 (four) times daily.  Marland Kitchen docusate sodium (COLACE) 100 MG capsule Take 100 mg by mouth daily.   Marland Kitchen levothyroxine (SYNTHROID, LEVOTHROID) 75 MCG tablet Take 1 tablet (75 mcg total) by mouth daily before breakfast.  . losartan (COZAAR) 100 MG tablet Take 1 tablet (100 mg total) by mouth daily.  . Multiple Vitamin (MULTIVITAMIN WITH MINERALS) TABS tablet Take 1 tablet by mouth daily.  Marland Kitchen omeprazole (PRILOSEC) 20 MG capsule Take 20 mg by mouth daily.  . pravastatin (PRAVACHOL) 20 MG tablet Take 1 tablet (20 mg total) by mouth daily.  Marland Kitchen tiZANidine (ZANAFLEX) 4 MG tablet Take 1 tablet by mouth daily.   No current facility-administered medications on file prior to visit.     Chief Complaint  Patient presents with  . Follow-up    Pt states that his breathing is doing okay.     Sleep tests PSG 09/29/02 >> AHI 12, SaO2 low 85%  Pulmonary tests PFT 12/12/14 >> FEV1 1.61 (60%), FEV1% 66, TLC 5.48 (82%), DLCO 47%, no BD  Past medical history GERD, HLD, HTN, Hypothyroidism, Depression  Past surgical hx, Medications, Allergies, Family hx, Social hx all reviewed.  Vital signs BP 118/62 (BP Location: Left Arm, Cuff Size: Normal)   Pulse 64   Ht 5\' 8"  (1.727 m)   Wt 232 lb 12.8 oz (105.6 kg)   SpO2 96%   BMI 35.40 kg/m   History of Present Illness: Cristian Yang is a 82 y.o. male former smoker with COPD/emphysema and OSA.  He has been doing okay.  Not having cough, wheeze, sputum, or chest pain.  He gets out to work on his yard several times per week.  Physical Exam:  General - pleasant Eyes - pupils reactive ENT - no sinus tenderness, no oral exudate, no LAN Cardiac - regular, no murmur Chest - no wheeze, rales Abd - soft,  non tender Ext - no edema Skin - no rashes Neuro - normal strength Psych - normal mood   Assessment/Plan:  COPD with emphysema. - continue spiriva, advair, and prn albuterol  Obstructive sleep apnea. - he is compliant with CPAP and reports benefit from therapy  Upper airway cough with post-nasal drip. - continue nasonex, singulair and prn zyrtec   Patient Instructions  Follow up in 6 months   Chesley Mires, MD [place] [facility] Pager:  [phone] 02/28/2017, 10:45 AM

## 2017-03-04 DIAGNOSIS — G894 Chronic pain syndrome: Secondary | ICD-10-CM | POA: Diagnosis not present

## 2017-03-04 DIAGNOSIS — M4316 Spondylolisthesis, lumbar region: Secondary | ICD-10-CM | POA: Diagnosis not present

## 2017-04-08 DIAGNOSIS — G894 Chronic pain syndrome: Secondary | ICD-10-CM | POA: Diagnosis not present

## 2017-04-08 DIAGNOSIS — M4316 Spondylolisthesis, lumbar region: Secondary | ICD-10-CM | POA: Diagnosis not present

## 2017-05-05 DIAGNOSIS — M180 Bilateral primary osteoarthritis of first carpometacarpal joints: Secondary | ICD-10-CM | POA: Diagnosis not present

## 2017-05-05 DIAGNOSIS — R2231 Localized swelling, mass and lump, right upper limb: Secondary | ICD-10-CM | POA: Diagnosis not present

## 2017-05-05 DIAGNOSIS — M795 Residual foreign body in soft tissue: Secondary | ICD-10-CM | POA: Diagnosis not present

## 2017-05-05 DIAGNOSIS — R2232 Localized swelling, mass and lump, left upper limb: Secondary | ICD-10-CM | POA: Diagnosis not present

## 2017-05-07 DIAGNOSIS — M7989 Other specified soft tissue disorders: Secondary | ICD-10-CM | POA: Diagnosis not present

## 2017-05-09 DIAGNOSIS — I10 Essential (primary) hypertension: Secondary | ICD-10-CM | POA: Diagnosis not present

## 2017-05-09 DIAGNOSIS — Z6835 Body mass index (BMI) 35.0-35.9, adult: Secondary | ICD-10-CM | POA: Diagnosis not present

## 2017-05-09 DIAGNOSIS — G894 Chronic pain syndrome: Secondary | ICD-10-CM | POA: Diagnosis not present

## 2017-05-09 DIAGNOSIS — M4316 Spondylolisthesis, lumbar region: Secondary | ICD-10-CM | POA: Diagnosis not present

## 2017-05-14 DIAGNOSIS — R2232 Localized swelling, mass and lump, left upper limb: Secondary | ICD-10-CM | POA: Diagnosis not present

## 2017-05-14 DIAGNOSIS — M795 Residual foreign body in soft tissue: Secondary | ICD-10-CM | POA: Diagnosis not present

## 2017-05-15 ENCOUNTER — Other Ambulatory Visit: Payer: Self-pay | Admitting: Orthopedic Surgery

## 2017-05-16 ENCOUNTER — Telehealth: Payer: Self-pay

## 2017-05-16 NOTE — Telephone Encounter (Signed)
   [facility] Pre-operative Risk Assessment    Request for surgical clearance:  1. What type of surgery is being performed?   RIGHT WRIST EXCISION VOLAR CYST & EXCISION FOREIGN BODY RIGHT HYPOTHENAR EMINENCE  2. When is this surgery scheduled? Tuesday 06-10-2017   3. Are there any medications that need to be held prior to surgery and how long? unknown   4. Practice name and name of physician performing surgery?  [facility]  5. What is your office phone and fax number? [phone] fax [phone] Attn:Lynn Dimas Millin, [place]  6. Anesthesia type (None, local, MAC, general) ? IV Regional Upper Arm Block   Waylan Rocher 05/16/2017, 4:34 PM  _________________________________________________________________   (provider comments below)

## 2017-05-19 NOTE — Telephone Encounter (Signed)
New message   Pt verbalized that he is returning call to rn

## 2017-05-19 NOTE — Telephone Encounter (Signed)
    Chart reviewed as part of pre-operative protocol coverage. Patient was contacted 05/19/2017 in reference to pre-operative risk assessment for pending surgery as outlined below. I was unable to reach the patient, we will try again later. According to his wife, he is doing fine.  Rosaria Ferries, PA-C 05/19/2017, 1:11 PM

## 2017-05-19 NOTE — Telephone Encounter (Signed)
    Chart reviewed as part of pre-operative protocol coverage. Patient was contacted 05/19/2017 in reference to pre-operative risk assessment for pending surgery as outlined below.     Mckinnon Glick Byus was last seen on 09/25/2016 by Dr Debara Pickett.  Since that day, Cristian Yang has done well. He feels that his breathing is at baseline. He has a cyst and a large splinter on his wrist that needs removal as well as another cyst. There is no general anesthesia planned.  Therefore, based on ACC/AHA guidelines, the patient would be at acceptable risk for the planned procedure without further cardiovascular testing.    Rosaria Ferries, PA-C 05/19/2017, 4:01 PM

## 2017-06-04 ENCOUNTER — Encounter (HOSPITAL_BASED_OUTPATIENT_CLINIC_OR_DEPARTMENT_OTHER): Payer: Self-pay | Admitting: *Deleted

## 2017-06-04 ENCOUNTER — Other Ambulatory Visit: Payer: Self-pay

## 2017-06-04 DIAGNOSIS — M4316 Spondylolisthesis, lumbar region: Secondary | ICD-10-CM | POA: Diagnosis not present

## 2017-06-04 DIAGNOSIS — G894 Chronic pain syndrome: Secondary | ICD-10-CM | POA: Diagnosis not present

## 2017-06-04 DIAGNOSIS — Z79899 Other long term (current) drug therapy: Secondary | ICD-10-CM | POA: Diagnosis not present

## 2017-06-04 DIAGNOSIS — Z79891 Long term (current) use of opiate analgesic: Secondary | ICD-10-CM | POA: Diagnosis not present

## 2017-06-04 NOTE — Progress Notes (Signed)
   06/04/17 [place]  Have you ever been diagnosed with sleep apnea through a sleep study? Yes  If yes, do you have and use a CPAP or BPAP machine every night? 1  Do you know the presssure settings on your maching? Yes  Do you snore loudly (loud enough to be heard through closed doors)?  0  Do you often feel tired, fatigued, or sleepy during the daytime (such as falling asleep during driving or talking to someone)? 1  Has anyone observed you stop breathing during your sleep? 0  Do you have, or are you being treated for high blood pressure? 1  BMI more than 35 kg/m2? 1  Age > 50 (1-yes) 1  Neck circumference greater than:Male 16 inches or larger, Male 17inches or larger? 0  Male Gender (Yes=1) 1  Obstructive Sleep Apnea Score 5

## 2017-06-05 ENCOUNTER — Other Ambulatory Visit: Payer: Self-pay | Admitting: Orthopedic Surgery

## 2017-06-10 ENCOUNTER — Ambulatory Visit (HOSPITAL_BASED_OUTPATIENT_CLINIC_OR_DEPARTMENT_OTHER): Admission: RE | Admit: 2017-06-10 | Payer: Medicare Other | Source: Ambulatory Visit | Admitting: Orthopedic Surgery

## 2017-06-10 SURGERY — CYST REMOVAL
Anesthesia: Regional | Laterality: Right

## 2017-07-02 DIAGNOSIS — M4316 Spondylolisthesis, lumbar region: Secondary | ICD-10-CM | POA: Diagnosis not present

## 2017-07-02 DIAGNOSIS — G894 Chronic pain syndrome: Secondary | ICD-10-CM | POA: Diagnosis not present

## 2017-07-30 DIAGNOSIS — M4316 Spondylolisthesis, lumbar region: Secondary | ICD-10-CM | POA: Diagnosis not present

## 2017-07-30 DIAGNOSIS — M5030 Other cervical disc degeneration, unspecified cervical region: Secondary | ICD-10-CM | POA: Diagnosis not present

## 2017-07-30 DIAGNOSIS — G894 Chronic pain syndrome: Secondary | ICD-10-CM | POA: Diagnosis not present

## 2017-07-30 DIAGNOSIS — Z6835 Body mass index (BMI) 35.0-35.9, adult: Secondary | ICD-10-CM | POA: Diagnosis not present

## 2017-07-31 ENCOUNTER — Telehealth: Payer: Self-pay | Admitting: Pulmonary Disease

## 2017-07-31 DIAGNOSIS — Z9989 Dependence on other enabling machines and devices: Principal | ICD-10-CM

## 2017-07-31 DIAGNOSIS — G4733 Obstructive sleep apnea (adult) (pediatric): Secondary | ICD-10-CM

## 2017-07-31 NOTE — Telephone Encounter (Signed)
Could not pull current download through [place].  Called [facility], pt's current pressure is set at 7cm.  Spoke with pt, states that his mask fit is not optimal, has frequent mask leaks.  Pt also notes that he is having increased sinus congestion wearing mask, waking up frequently.  States he feels like his pressure is too high and chokes him. Requesting a lower cpap pressure.    VS please advise.  Thanks.

## 2017-08-03 NOTE — Telephone Encounter (Signed)
Please let him know it sounds like his mask fit is more of an issue.  I would like to get his mask refitted first, and then determine if he needs to have adjustment to pressure setting for his CPAP.  Please send order to his DME to refit his CPAP mask.

## 2017-08-04 NOTE — Telephone Encounter (Signed)
lmtcb x1 for pt. 

## 2017-08-05 NOTE — Telephone Encounter (Signed)
Called and spoke with pt's wife Emerson Monte letting her know that I was placing an order to pt's DME to have him fitted for a new mask. Retha expressed understanding. Nothing further needed.

## 2017-08-22 ENCOUNTER — Ambulatory Visit: Payer: Medicare Other | Admitting: Pulmonary Disease

## 2017-09-01 DIAGNOSIS — G894 Chronic pain syndrome: Secondary | ICD-10-CM | POA: Diagnosis not present

## 2017-09-01 DIAGNOSIS — M5030 Other cervical disc degeneration, unspecified cervical region: Secondary | ICD-10-CM | POA: Diagnosis not present

## 2017-09-01 DIAGNOSIS — M4316 Spondylolisthesis, lumbar region: Secondary | ICD-10-CM | POA: Diagnosis not present

## 2017-09-02 ENCOUNTER — Encounter: Payer: Self-pay | Admitting: Family Medicine

## 2017-09-02 ENCOUNTER — Ambulatory Visit (INDEPENDENT_AMBULATORY_CARE_PROVIDER_SITE_OTHER): Payer: Medicare Other | Admitting: Family Medicine

## 2017-09-02 ENCOUNTER — Other Ambulatory Visit: Payer: Self-pay

## 2017-09-02 VITALS — BP 142/74 | HR 56 | Temp 97.9°F | Ht 68.0 in | Wt 237.8 lb

## 2017-09-02 DIAGNOSIS — J439 Emphysema, unspecified: Secondary | ICD-10-CM

## 2017-09-02 DIAGNOSIS — J441 Chronic obstructive pulmonary disease with (acute) exacerbation: Secondary | ICD-10-CM

## 2017-09-02 MED ORDER — DOXYCYCLINE HYCLATE 100 MG PO TABS: 100.0000 mg | ORAL_TABLET | Freq: Two times a day (BID) | ORAL | 0 refills | Status: AC

## 2017-09-02 MED ORDER — PREDNISONE 50 MG PO TABS: 50.0000 mg | ORAL_TABLET | Freq: Every day | ORAL | 0 refills | Status: DC

## 2017-09-02 NOTE — Patient Instructions (Signed)
Thank you for coming in today, it was so nice to see you! Today we talked about:    Lung infection: Take your Doxycycline antibiotic as prescribed, 1 pill in the morning and at night for the next week.  He will also need to take 1 pill of prednisone every day.  This medication should calm the inflammation in your lungs.  Use your albuterol inhaler once every 4 hours for the next 24 hours and then twice a day as needed  Please go to the emergency room if your shortness of breath gets worse, you are wheezing gets worse, or you have chest pain  Continue to use all of your other COPD inhalers as prescibed  If you have any questions or concerns, please do not hesitate to call the office at [phone]. You can also message me directly via MyChart.   Sincerely,  Smitty Cords, MD   Chronic Obstructive Pulmonary Disease Exacerbation Chronic obstructive pulmonary disease (COPD) is a common lung problem. In COPD, the flow of air from the lungs is limited. COPD exacerbations are times that breathing gets worse and you need extra treatment. Without treatment they can be life threatening. If they happen often, your lungs can become more damaged. If your COPD gets worse, your doctor may treat you with:  Medicines.  Oxygen.  Different ways to clear your airway, such as using a mask.  Follow these instructions at home:  Do not smoke.  Avoid tobacco smoke and other things that bother your lungs.  If given, take your antibiotic medicine as told. Finish the medicine even if you start to feel better.  Only take medicines as told by your doctor.  Drink enough fluids to keep your pee (urine) clear or pale yellow (unless your doctor has told you not to).  Use a cool mist machine (vaporizer).  If you use oxygen or a machine that turns liquid medicine into a mist (nebulizer), continue to use them as told.  Keep up with shots (vaccinations) as told by your doctor.  Exercise  regularly.  Eat healthy foods.  Keep all doctor visits as told. Get help right away if:  You are very short of breath and it gets worse.  You have trouble talking.  You have bad chest pain.  You have blood in your spit (sputum).  You have a fever.  You keep throwing up (vomiting).  You feel weak, or you pass out (faint).  You feel confused.  You keep getting worse. This information is not intended to replace advice given to you by your health care provider. Make sure you discuss any questions you have with your health care provider. Document Released: 06/27/2011 Document Revised: 12/14/2015 Document Reviewed: 03/12/2013 Elsevier Interactive Patient Education  2017 [employer].

## 2017-09-02 NOTE — Progress Notes (Signed)
Subjective:    Patient ID: Cristian Yang , male   DOB: July 10, 1935 , 83 y.o..   MRN: [id number]  HPI  Cristian Yang is a 83 year old male with history of hypertension, OSA on CPAP, COPD, GERD, arthritis, hyperlipidemia here for  Chief Complaint  Patient presents with  . chest cold    1.  "Chest cold"  Major symptoms: Mildly runny nose, cough mostly in the morning is productive of green or yellow sputum Has been sick for 5 days. Progression: Started with a mildly runny nose and then developed into a cough that is very bad in the morning Medications tried: Albuterol and Mucinex Sick contacts: None Patient believes may be caused by unsure  Symptoms Fever: No Headache or face pain: No Tooth pain: No Sneezing: No Scratchy throat: Mildly Allergies: No Muscle aches: No Severe fatigue: No Stiff neck: No Shortness of breath: No Rash: No Sore throat or swollen glands: Mildly sore throat Of note patient has COPD, he has been compliant with all of his inhalers.  He has needed to use the albuterol once or twice a day   ROS see HPI Smoking Status noted   Past Medical History: Patient Active Problem List   Diagnosis Date Noted  . Medication refill 12/24/2016  . Right ankle pain 12/09/2016  . Ganglion cyst 09/21/2016  . Restless leg 09/21/2016  . Chronic obstructive pulmonary disease ([place]) 04/12/2015  . OSA on CPAP 06/02/2014  . GERD (gastroesophageal reflux disease) 06/02/2014  . Hyperlipidemia 06/02/2014  . Arthritis 06/02/2014  . Hypertension 06/02/2014    Medications: reviewed and updated Current Outpatient Medications  Medication Sig Dispense Refill  . albuterol (PROVENTIL) (2.5 MG/3ML) 0.083% nebulizer solution USE 1 VIAL (3 ML) VIA NEBULIZER DAILY DX: J44.9 900 mL 2  . aspirin EC 81 MG tablet Take 81 mg by mouth daily.    . citalopram (CELEXA) 20 MG tablet Take 1 tablet (20 mg total) by mouth daily. 90 tablet 3  . diclofenac sodium (VOLTAREN) 1 % GEL Apply 2 g  topically 4 (four) times daily. 100 g 2  . docusate sodium (COLACE) 100 MG capsule Take 100 mg by mouth daily.     Marland Kitchen doxycycline (VIBRA-TABS) 100 MG tablet Take 1 tablet (100 mg total) by mouth 2 (two) times daily for 7 days. 14 tablet 0  . Fluticasone-Salmeterol (ADVAIR) 250-50 MCG/DOSE AEPB Inhale 1 puff into the lungs 2 (two) times daily. 1 each 5  . gabapentin (NEURONTIN) 300 MG capsule Take 300 mg 3 (three) times daily by mouth.    Marland Kitchen HYDROcodone-acetaminophen (NORCO/VICODIN) 5-325 MG tablet Take 1 tablet every 6 (six) hours as needed by mouth for moderate pain.    Marland Kitchen levothyroxine (SYNTHROID, LEVOTHROID) 75 MCG tablet Take 1 tablet (75 mcg total) by mouth daily before breakfast. 90 tablet 3  . losartan (COZAAR) 100 MG tablet Take 1 tablet (100 mg total) by mouth daily. 90 tablet 3  . mometasone (NASONEX) 50 MCG/ACT nasal spray Place 2 sprays into the nose daily. 51 g 1  . montelukast (SINGULAIR) 10 MG tablet Take 1 tablet (10 mg total) by mouth at bedtime. 90 tablet 3  . Multiple Vitamin (MULTIVITAMIN WITH MINERALS) TABS tablet Take 1 tablet by mouth daily.    . pravastatin (PRAVACHOL) 20 MG tablet Take 1 tablet (20 mg total) by mouth daily. 90 tablet 3  . predniSONE (DELTASONE) 50 MG tablet Take 1 tablet (50 mg total) by mouth daily with breakfast. 5 tablet 0  .  Tiotropium Bromide Monohydrate (SPIRIVA RESPIMAT) 2.5 MCG/ACT AERS Inhale 2 puffs into the lungs daily. 1 Inhaler 5  . tiZANidine (ZANAFLEX) 4 MG tablet Take 1 tablet by mouth daily.  0   No current facility-administered medications for this visit.     Social Hx:  reports that he quit smoking about 14 years ago. His smoking use included cigarettes. He has a 60.00 pack-year smoking history. he has never used smokeless tobacco.   Objective:   BP (!) 142/74   Pulse (!) 56   Temp 97.9 F (36.6 C) (Oral)   Ht 5\' 8"  (1.727 m)   Wt 237 lb 12.8 oz (107.9 kg)   SpO2 92%   BMI 36.16 kg/m  Physical Exam  Gen: NAD, alert,  cooperative with exam, well-appearing HEENT:     Head: Normocephalic, atraumatic    Neck: No masses palpated. No goiter. No lymphadenopathy     Ears: External ears normal, no drainage.Tympanic membranes intact, normal light reflex bilaterally, no erythema or bulging    Eyes: PERRLA, EOMI, sclera white, normal conjunctiva    Nose: nasal turbinates moist, clear nasal discharge with irritated nasal mucosa bilaterally    Throat: moist mucus membranes, no pharyngeal erythema, no tonsillar exudate. Airway is patent Cardiac: Regular rate and rhythm, normal S1/S2, no murmur, no edema, capillary refill brisk  Respiratory: Normal work of breathing, bilateral expiratory wheezing, no crackles appreciated Gastrointestinal: soft, non tender, non distended, bowel sounds present Skin: no rashes, normal turgor  Neurological: no gross deficits.  Psych: good insight, normal mood and affect  Assessment & Plan:   1. COPD exacerbation ([place]): History and exam most consistent with COPD exacerbation, likely set off by a viral URI.  Major symptom is increased cough and sputum.  Has been afebrile.  He has normal work of breathing but lungs do exhibit some expiratory wheezing bilaterally.  O2 92% on room air. -Offered DuoNeb while in clinic, patient declined - We will treat with doxycycline 100 mg twice daily x7-day course - Prednisone 50 mg daily x 5 day course -Advised albuterol usage every 4 hours for the next 24 hours and then as needed -Continue controller inhalers (Advair and Spiriva) -ED precautions discussed  Meds ordered this encounter  Medications  . doxycycline (VIBRA-TABS) 100 MG tablet    Sig: Take 1 tablet (100 mg total) by mouth 2 (two) times daily for 7 days.    Dispense:  14 tablet    Refill:  0  . predniSONE (DELTASONE) 50 MG tablet    Sig: Take 1 tablet (50 mg total) by mouth daily with breakfast.    Dispense:  5 tablet    Refill:  0    Smitty Cords, MD [place],  PGY-3

## 2017-09-04 ENCOUNTER — Ambulatory Visit: Payer: Medicare Other | Admitting: Pulmonary Disease

## 2017-09-08 ENCOUNTER — Other Ambulatory Visit: Payer: Self-pay | Admitting: Internal Medicine

## 2017-09-08 ENCOUNTER — Ambulatory Visit: Payer: Medicare Other | Admitting: Pulmonary Disease

## 2017-09-19 ENCOUNTER — Other Ambulatory Visit: Payer: Self-pay | Admitting: Pulmonary Disease

## 2017-09-19 ENCOUNTER — Other Ambulatory Visit: Payer: Self-pay

## 2017-09-19 MED ORDER — LOSARTAN POTASSIUM 100 MG PO TABS: 100.0000 mg | ORAL_TABLET | Freq: Every day | ORAL | 0 refills | Status: DC

## 2017-09-30 DIAGNOSIS — G894 Chronic pain syndrome: Secondary | ICD-10-CM | POA: Diagnosis not present

## 2017-09-30 DIAGNOSIS — M4316 Spondylolisthesis, lumbar region: Secondary | ICD-10-CM | POA: Diagnosis not present

## 2017-10-11 ENCOUNTER — Other Ambulatory Visit: Payer: Self-pay | Admitting: Internal Medicine

## 2017-10-13 ENCOUNTER — Other Ambulatory Visit: Payer: Self-pay

## 2017-10-15 ENCOUNTER — Ambulatory Visit: Payer: Medicare Other | Admitting: Pulmonary Disease

## 2017-10-17 ENCOUNTER — Other Ambulatory Visit: Payer: Self-pay | Admitting: *Deleted

## 2017-10-18 MED ORDER — LOSARTAN POTASSIUM 100 MG PO TABS: 100.0000 mg | ORAL_TABLET | Freq: Every day | ORAL | 0 refills | Status: DC

## 2017-10-21 ENCOUNTER — Encounter: Payer: Self-pay | Admitting: Internal Medicine

## 2017-10-21 ENCOUNTER — Other Ambulatory Visit: Payer: Self-pay

## 2017-10-21 ENCOUNTER — Ambulatory Visit (INDEPENDENT_AMBULATORY_CARE_PROVIDER_SITE_OTHER): Payer: Medicare Other | Admitting: Internal Medicine

## 2017-10-21 VITALS — BP 170/78 | HR 57 | Temp 97.5°F | Ht 68.0 in | Wt 240.0 lb

## 2017-10-21 DIAGNOSIS — I10 Essential (primary) hypertension: Secondary | ICD-10-CM

## 2017-10-21 DIAGNOSIS — E785 Hyperlipidemia, unspecified: Secondary | ICD-10-CM | POA: Diagnosis not present

## 2017-10-21 MED ORDER — OMEPRAZOLE 20 MG PO CPDR: 20.0000 mg | DELAYED_RELEASE_CAPSULE | Freq: Every day | ORAL | 3 refills | Status: DC

## 2017-10-21 MED ORDER — HYDROCHLOROTHIAZIDE 25 MG PO TABS: 25.0000 mg | ORAL_TABLET | Freq: Every day | ORAL | 0 refills | Status: DC

## 2017-10-21 NOTE — Patient Instructions (Signed)
Mr. Conwell,  I have added hydrochlorothiazide 25 mg for high blood pressure. Take this with the losartan. It would be good to see you back in a couple weeks to make sure your blood pressure is doing better.  Try to get more regular exercise. Walking 150 minutes a week would be great.  I have reordered the omeprazole for reflux.   Best, Dr. Ola Spurr

## 2017-10-22 LAB — COMPREHENSIVE METABOLIC PANEL
ALT: 15 IU/L (ref 0–44)
AST: 21 IU/L (ref 0–40)
Albumin/Globulin Ratio: 1.9 (ref 1.2–2.2)
Albumin: 4.1 g/dL (ref 3.5–4.7)
Alkaline Phosphatase: 60 IU/L (ref 39–117)
BUN/Creatinine Ratio: 8 — ABNORMAL LOW (ref 10–24)
BUN: 7 mg/dL — ABNORMAL LOW (ref 8–27)
Bilirubin Total: 0.3 mg/dL (ref 0.0–1.2)
CALCIUM: 9.4 mg/dL (ref 8.6–10.2)
CHLORIDE: 99 mmol/L (ref 96–106)
CO2: 27 mmol/L (ref 20–29)
Creatinine, Ser: 0.92 mg/dL (ref 0.76–1.27)
GFR calc Af Amer: 90 mL/min/{1.73_m2} (ref >59)
GFR, EST NON AFRICAN AMERICAN: 78 mL/min/{1.73_m2} (ref >59)
GLOBULIN, TOTAL: 2.2 g/dL (ref 1.5–4.5)
Glucose: 78 mg/dL (ref 65–99)
POTASSIUM: 4.5 mmol/L (ref 3.5–5.2)
SODIUM: 141 mmol/L (ref 134–144)
Total Protein: 6.3 g/dL (ref 6.0–8.5)

## 2017-10-22 LAB — LIPID PANEL
CHOL/HDL RATIO: 2.2 ratio (ref 0.0–5.0)
CHOLESTEROL TOTAL: 174 mg/dL (ref 100–199)
HDL: 80 mg/dL (ref >39)
LDL CALC: 60 mg/dL (ref 0–99)
TRIGLYCERIDES: 172 mg/dL — AB (ref 0–149)
VLDL CHOLESTEROL CAL: 34 mg/dL (ref 5–40)

## 2017-10-25 ENCOUNTER — Encounter: Payer: Self-pay | Admitting: Internal Medicine

## 2017-10-25 ENCOUNTER — Ambulatory Visit (HOSPITAL_COMMUNITY)
Admission: RE | Admit: 2017-10-25 | Discharge: 2017-10-25 | Disposition: A | Discharge status: Home / Self Care | Payer: Medicare Other | Source: Ambulatory Visit | Attending: Internal Medicine | Admitting: Internal Medicine

## 2017-10-25 ENCOUNTER — Other Ambulatory Visit (HOSPITAL_COMMUNITY): Payer: Self-pay | Admitting: Internal Medicine

## 2017-10-25 DIAGNOSIS — I878 Other specified disorders of veins: Secondary | ICD-10-CM | POA: Diagnosis not present

## 2017-10-25 DIAGNOSIS — R0602 Shortness of breath: Secondary | ICD-10-CM | POA: Diagnosis not present

## 2017-10-25 DIAGNOSIS — R0600 Dyspnea, unspecified: Secondary | ICD-10-CM | POA: Diagnosis present

## 2017-10-25 NOTE — Progress Notes (Signed)
[name] Progress Note  Subjective:  Cristian Yang is a 83 y.o. male with history of COPD, HTN, OSA, hypothyroidism, HLD, and chronic back pain with known cervical anterolisthesis and s/p ACDF who presents with request for hospital bed. He says he thinks this would help his chronic back pain. He also reports GERD that is worse at night and causes coughing on CPAP. He does report running out of omeprazole. He is taking oxycodone for his back pain from [facility]. He says he has tried a wedge. He denies orthopnea. He also notes elevated blood pressure reading with SBP in 170s last week. He is taking losartan 100 mg daily and denies missing doses.  He stopped taking pravastatin this past winter because his wife thought he was having trouble with his memory. Patient denies noticing a difference since stopping this, but his wife says there has been substantial improvement. Patient says he enjoys walking but has not been very active because he finds it difficult to leave the house with his wife needing frequent assistance for medical issues. He also reports ankle pains with swelling.   ROS: No changes in vision, no focal deficits, no dyspnea  No history of gout.   Past Medical History:  Diagnosis Date  . Arthritis   . Chronic back pain   . COPD (chronic obstructive pulmonary disease) ([place])   . Depression   . GERD (gastroesophageal reflux disease)   . Hyperlipidemia   . Hypertension   . Hypothyroidism   . Skin cancer   . Sleep apnea    uses CPAP nightly      Allergies  Allergen Reactions  . Ciprofloxacin Nausea And Vomiting  . Codeine Nausea And Vomiting    Social History   Tobacco Use  . Smoking status: Former Smoker    Packs/day: 2.00    Years: 30.00    Pack years: 60.00    Types: Cigarettes    Last attempt to quit: 07/23/2003    Years since quitting: 14.2  . Smokeless tobacco: Never Used  Substance Use Topics  . Alcohol use: No   Alcohol/week: 0.0 oz    Comment: quit 2005    Objective: Blood pressure (!) 170/78, pulse (!) 57, temperature (!) 97.5 F (36.4 C), temperature source Oral, height 5\' 8"  (1.727 m), weight 240 lb (108.9 kg), SpO2 94 %. Body mass index is 36.49 kg/m. Constitutional: Pleasant, obese male in NAD. Hard of hearing.  Cardiovascular: RRR, II/VI systolic heart murmur (patient says is chronic and follows with Dr. Debara Pickett) Pulmonary/Chest: Effort normal and breath sounds normal.  Musculoskeletal: No LE edema Psychiatric: Normal mood and affect.  Vitals reviewed  Assessment/Plan: Hypertension - Patient not at goal of < 150/90 today. Reports elevated SBP measurement last week, as well. Will start 25 mg hctz. Continue losartan 100 mg. Patient thinks weight gain may be contributing. - Ordered CMP and lipid panel (as recently stopped statin therapy) - Consider checking TSH, though patient has been on same dose of levothyroxine for years and had normal TSH in 2018.  - Recommended trying to get 150 minutes of moderate activity weekly.   Regarding request for hospital bed, recommended continuing wedge and restarting omeprazole for GERD symptoms. If patient has worsening of chronic respiratory issues or concern for aspiration, he may eventually qualify.   Follow-up in 1-2 weeks for BP recheck.  Olene Floss, MD [place], PGY-3

## 2017-10-25 NOTE — Assessment & Plan Note (Signed)
-   Patient not at goal of < 150/90 today. Reports elevated SBP measurement last week, as well. Will start 25 mg hctz. Continue losartan 100 mg. Patient thinks weight gain may be contributing. - Ordered CMP and lipid panel (as recently stopped statin therapy) - Consider checking TSH, though patient has been on same dose of levothyroxine for years and had normal TSH in 2018.  - Recommended trying to get 150 minutes of moderate activity weekly.

## 2017-10-27 ENCOUNTER — Ambulatory Visit: Payer: Medicare Other | Admitting: Internal Medicine

## 2017-10-31 DIAGNOSIS — G894 Chronic pain syndrome: Secondary | ICD-10-CM | POA: Diagnosis not present

## 2017-10-31 DIAGNOSIS — M5030 Other cervical disc degeneration, unspecified cervical region: Secondary | ICD-10-CM | POA: Diagnosis not present

## 2017-10-31 DIAGNOSIS — M4316 Spondylolisthesis, lumbar region: Secondary | ICD-10-CM | POA: Diagnosis not present

## 2017-11-04 ENCOUNTER — Other Ambulatory Visit: Payer: Self-pay

## 2017-11-04 ENCOUNTER — Encounter: Payer: Self-pay | Admitting: Internal Medicine

## 2017-11-04 ENCOUNTER — Ambulatory Visit (INDEPENDENT_AMBULATORY_CARE_PROVIDER_SITE_OTHER): Payer: Medicare Other | Admitting: Internal Medicine

## 2017-11-04 VITALS — BP 140/68 | HR 57 | Temp 97.7°F | Wt 236.0 lb

## 2017-11-04 DIAGNOSIS — I10 Essential (primary) hypertension: Secondary | ICD-10-CM

## 2017-11-04 MED ORDER — HYDROCHLOROTHIAZIDE 25 MG PO TABS: 25.0000 mg | ORAL_TABLET | Freq: Every day | ORAL | 5 refills | Status: DC

## 2017-11-04 NOTE — Progress Notes (Signed)
[name] Progress Note  Subjective:  Cristian Yang is a 83 y.o. male with history of COPD, OSA, chronic back pain, HTN, and GERD who presents for follow-up BP. He reports some elevated readings at home that he cannot recall but notes he had just completed a course of prednisone for COPD exacerbation. He denies dizziness, abdominal discomfort, or headaches since starting medication. He thinks he may be urinating more but says it's hard to say because he urinates a lot at baseline.   He reports breathing improved after steroid course but still a little more short of breath than usual but does not limit activities. He had a normal chest x-ray by urgent care 10/25/17. Denies fever. Occasional cough.   Patient does report difficulty with cpap machine but continues to wear it nightly. The current setting makes it difficult to fall asleep--he says air starts at full force immediately whereas older machine had come on more gradually. He does report improvement in staying asleep using melatonin. He has follow-up with Pulmonology (Dr. Halford Chessman) at the end of this month.   Allergies  Allergen Reactions  . Ciprofloxacin Nausea And Vomiting  . Codeine Nausea And Vomiting    Social History   Tobacco Use  . Smoking status: Former Smoker    Packs/day: 2.00    Years: 30.00    Pack years: 60.00    Types: Cigarettes    Last attempt to quit: 07/23/2003    Years since quitting: 14.2  . Smokeless tobacco: Never Used  Substance Use Topics  . Alcohol use: No    Alcohol/week: 0.0 oz    Comment: quit 2005    Objective: Blood pressure 140/68, pulse (!) 57, temperature 97.7 F (36.5 C), temperature source Oral, weight 236 lb (107 kg), SpO2 94 %. Body mass index is 35.88 kg/m. Constitutional: Pleasant male in NAD.  Cardiovascular: RRR, S1, S2, II/VI systolic heart murmr Pulmonary/Chest: Effort normal and breath sounds normal. No wheezing or rhonchi.  Musculoskeletal: No LE edema.   Neurological: AOx3, no focal deficits. Skin: Skin is warm and dry. No rash noted.  Psychiatric: Normal mood and affect.  Vitals reviewed  Assessment/Plan: Hypertension - At goal of <150/90 today. Continue losartan 100 mg and hctz 25 mg daily.  - Recommended limiting sugary beverages and increasing physical activity with walking around property as able as lifestyle factors that could help with weight  Follow-up in a few months after patient has had his appointments with cardiology and pulmonology.   Olene Floss, MD [place], PGY-3

## 2017-11-04 NOTE — Patient Instructions (Signed)
Mr. Kittelson,  Please continue the losartan and hydrochlorothiazide.  Try to increase your physical activity and limit sugary drinks like coke to help have a healthier weight.  Please see [place] back in a few months.  Best, Dr. Ola Spurr

## 2017-11-05 ENCOUNTER — Encounter (HOSPITAL_COMMUNITY): Payer: Self-pay | Admitting: Emergency Medicine

## 2017-11-05 ENCOUNTER — Telehealth: Payer: Self-pay | Admitting: Internal Medicine

## 2017-11-05 ENCOUNTER — Inpatient Hospital Stay (HOSPITAL_COMMUNITY): Payer: Medicare Other

## 2017-11-05 ENCOUNTER — Inpatient Hospital Stay (HOSPITAL_COMMUNITY)
Admission: EM | Admit: 2017-11-05 | Discharge: 2017-11-13 | DRG: 871 | Disposition: A | Discharge status: Home Health | Payer: Medicare Other | Attending: Internal Medicine | Admitting: Internal Medicine

## 2017-11-05 ENCOUNTER — Emergency Department (HOSPITAL_COMMUNITY): Payer: Medicare Other

## 2017-11-05 ENCOUNTER — Encounter: Payer: Self-pay | Admitting: Internal Medicine

## 2017-11-05 ENCOUNTER — Other Ambulatory Visit: Payer: Self-pay

## 2017-11-05 DIAGNOSIS — E861 Hypovolemia: Secondary | ICD-10-CM | POA: Diagnosis present

## 2017-11-05 DIAGNOSIS — J189 Pneumonia, unspecified organism: Secondary | ICD-10-CM

## 2017-11-05 DIAGNOSIS — I959 Hypotension, unspecified: Secondary | ICD-10-CM

## 2017-11-05 DIAGNOSIS — R140 Abdominal distension (gaseous): Secondary | ICD-10-CM | POA: Diagnosis not present

## 2017-11-05 DIAGNOSIS — E876 Hypokalemia: Secondary | ICD-10-CM | POA: Diagnosis not present

## 2017-11-05 DIAGNOSIS — I119 Hypertensive heart disease without heart failure: Secondary | ICD-10-CM | POA: Diagnosis present

## 2017-11-05 DIAGNOSIS — J9621 Acute and chronic respiratory failure with hypoxia: Secondary | ICD-10-CM | POA: Diagnosis present

## 2017-11-05 DIAGNOSIS — Z4659 Encounter for fitting and adjustment of other gastrointestinal appliance and device: Secondary | ICD-10-CM

## 2017-11-05 DIAGNOSIS — J440 Chronic obstructive pulmonary disease with acute lower respiratory infection: Secondary | ICD-10-CM | POA: Diagnosis present

## 2017-11-05 DIAGNOSIS — J181 Lobar pneumonia, unspecified organism: Secondary | ICD-10-CM | POA: Diagnosis present

## 2017-11-05 DIAGNOSIS — G4733 Obstructive sleep apnea (adult) (pediatric): Secondary | ICD-10-CM | POA: Diagnosis present

## 2017-11-05 DIAGNOSIS — N179 Acute kidney failure, unspecified: Secondary | ICD-10-CM | POA: Diagnosis present

## 2017-11-05 DIAGNOSIS — F329 Major depressive disorder, single episode, unspecified: Secondary | ICD-10-CM | POA: Diagnosis present

## 2017-11-05 DIAGNOSIS — Z8249 Family history of ischemic heart disease and other diseases of the circulatory system: Secondary | ICD-10-CM

## 2017-11-05 DIAGNOSIS — J441 Chronic obstructive pulmonary disease with (acute) exacerbation: Secondary | ICD-10-CM | POA: Diagnosis present

## 2017-11-05 DIAGNOSIS — R112 Nausea with vomiting, unspecified: Secondary | ICD-10-CM | POA: Diagnosis present

## 2017-11-05 DIAGNOSIS — R55 Syncope and collapse: Secondary | ICD-10-CM | POA: Diagnosis present

## 2017-11-05 DIAGNOSIS — E039 Hypothyroidism, unspecified: Secondary | ICD-10-CM | POA: Diagnosis present

## 2017-11-05 DIAGNOSIS — R4020 Unspecified coma: Secondary | ICD-10-CM | POA: Diagnosis not present

## 2017-11-05 DIAGNOSIS — A419 Sepsis, unspecified organism: Secondary | ICD-10-CM | POA: Diagnosis present

## 2017-11-05 DIAGNOSIS — R0602 Shortness of breath: Secondary | ICD-10-CM | POA: Diagnosis not present

## 2017-11-05 DIAGNOSIS — R0600 Dyspnea, unspecified: Secondary | ICD-10-CM

## 2017-11-05 DIAGNOSIS — J9600 Acute respiratory failure, unspecified whether with hypoxia or hypercapnia: Secondary | ICD-10-CM | POA: Diagnosis present

## 2017-11-05 DIAGNOSIS — K219 Gastro-esophageal reflux disease without esophagitis: Secondary | ICD-10-CM | POA: Diagnosis present

## 2017-11-05 DIAGNOSIS — K429 Umbilical hernia without obstruction or gangrene: Secondary | ICD-10-CM | POA: Diagnosis not present

## 2017-11-05 DIAGNOSIS — Z85828 Personal history of other malignant neoplasm of skin: Secondary | ICD-10-CM

## 2017-11-05 DIAGNOSIS — I4510 Unspecified right bundle-branch block: Secondary | ICD-10-CM | POA: Diagnosis present

## 2017-11-05 DIAGNOSIS — Z9181 History of falling: Secondary | ICD-10-CM

## 2017-11-05 DIAGNOSIS — R6521 Severe sepsis with septic shock: Secondary | ICD-10-CM | POA: Diagnosis present

## 2017-11-05 DIAGNOSIS — Z4682 Encounter for fitting and adjustment of non-vascular catheter: Secondary | ICD-10-CM | POA: Diagnosis not present

## 2017-11-05 DIAGNOSIS — S3993XA Unspecified injury of pelvis, initial encounter: Secondary | ICD-10-CM | POA: Diagnosis not present

## 2017-11-05 DIAGNOSIS — K529 Noninfective gastroenteritis and colitis, unspecified: Secondary | ICD-10-CM | POA: Diagnosis present

## 2017-11-05 DIAGNOSIS — M549 Dorsalgia, unspecified: Secondary | ICD-10-CM | POA: Diagnosis present

## 2017-11-05 DIAGNOSIS — E785 Hyperlipidemia, unspecified: Secondary | ICD-10-CM | POA: Diagnosis present

## 2017-11-05 DIAGNOSIS — Z7982 Long term (current) use of aspirin: Secondary | ICD-10-CM

## 2017-11-05 DIAGNOSIS — Z825 Family history of asthma and other chronic lower respiratory diseases: Secondary | ICD-10-CM

## 2017-11-05 DIAGNOSIS — Z833 Family history of diabetes mellitus: Secondary | ICD-10-CM

## 2017-11-05 DIAGNOSIS — M1990 Unspecified osteoarthritis, unspecified site: Secondary | ICD-10-CM | POA: Diagnosis present

## 2017-11-05 DIAGNOSIS — Z800 Family history of malignant neoplasm of digestive organs: Secondary | ICD-10-CM

## 2017-11-05 DIAGNOSIS — J9601 Acute respiratory failure with hypoxia: Secondary | ICD-10-CM

## 2017-11-05 DIAGNOSIS — J9690 Respiratory failure, unspecified, unspecified whether with hypoxia or hypercapnia: Secondary | ICD-10-CM | POA: Diagnosis not present

## 2017-11-05 DIAGNOSIS — G8929 Other chronic pain: Secondary | ICD-10-CM | POA: Diagnosis present

## 2017-11-05 DIAGNOSIS — Z978 Presence of other specified devices: Secondary | ICD-10-CM

## 2017-11-05 DIAGNOSIS — J209 Acute bronchitis, unspecified: Secondary | ICD-10-CM | POA: Diagnosis not present

## 2017-11-05 DIAGNOSIS — R6520 Severe sepsis without septic shock: Secondary | ICD-10-CM | POA: Diagnosis not present

## 2017-11-05 DIAGNOSIS — Z0189 Encounter for other specified special examinations: Secondary | ICD-10-CM

## 2017-11-05 DIAGNOSIS — R402411 Glasgow coma scale score 13-15, in the field [EMT or ambulance]: Secondary | ICD-10-CM | POA: Diagnosis not present

## 2017-11-05 DIAGNOSIS — Z7951 Long term (current) use of inhaled steroids: Secondary | ICD-10-CM

## 2017-11-05 DIAGNOSIS — E871 Hypo-osmolality and hyponatremia: Secondary | ICD-10-CM | POA: Diagnosis present

## 2017-11-05 DIAGNOSIS — Z87891 Personal history of nicotine dependence: Secondary | ICD-10-CM

## 2017-11-05 DIAGNOSIS — G4700 Insomnia, unspecified: Secondary | ICD-10-CM | POA: Diagnosis present

## 2017-11-05 DIAGNOSIS — E669 Obesity, unspecified: Secondary | ICD-10-CM | POA: Diagnosis present

## 2017-11-05 LAB — CBC WITH DIFFERENTIAL/PLATELET
Basophils Absolute: 0 10*3/uL (ref 0.0–0.1)
Basophils Relative: 0 %
Eosinophils Absolute: 0.3 10*3/uL (ref 0.0–0.7)
Eosinophils Relative: 2 %
HEMATOCRIT: 43.9 % (ref 39.0–52.0)
HEMOGLOBIN: 14.9 g/dL (ref 13.0–17.0)
LYMPHS ABS: 1.9 10*3/uL (ref 0.7–4.0)
Lymphocytes Relative: 11 %
MCH: 31.9 pg (ref 26.0–34.0)
MCHC: 33.9 g/dL (ref 30.0–36.0)
MCV: 94 fL (ref 78.0–100.0)
MONOS PCT: 1 %
Monocytes Absolute: 0.2 10*3/uL (ref 0.1–1.0)
NEUTROS ABS: 14.5 10*3/uL — AB (ref 1.7–7.7)
NEUTROS PCT: 86 %
Platelets: 289 10*3/uL (ref 150–400)
RBC: 4.67 MIL/uL (ref 4.22–5.81)
RDW: 12.8 % (ref 11.5–15.5)
WBC: 16.9 10*3/uL — AB (ref 4.0–10.5)

## 2017-11-05 LAB — BASIC METABOLIC PANEL
ANION GAP: 13 (ref 5–15)
BUN: 14 mg/dL (ref 6–20)
CHLORIDE: 88 mmol/L — AB (ref 101–111)
CO2: 25 mmol/L (ref 22–32)
Calcium: 9.4 mg/dL (ref 8.9–10.3)
Creatinine, Ser: 1.27 mg/dL — ABNORMAL HIGH (ref 0.61–1.24)
GFR calc non Af Amer: 51 mL/min — ABNORMAL LOW (ref >60)
GFR, EST AFRICAN AMERICAN: 59 mL/min — AB (ref >60)
Glucose, Bld: 129 mg/dL — ABNORMAL HIGH (ref 65–99)
POTASSIUM: 3.8 mmol/L (ref 3.5–5.1)
Sodium: 126 mmol/L — ABNORMAL LOW (ref 135–145)

## 2017-11-05 LAB — URINALYSIS, ROUTINE W REFLEX MICROSCOPIC
BILIRUBIN URINE: NEGATIVE
Glucose, UA: NEGATIVE mg/dL
Hgb urine dipstick: NEGATIVE
Ketones, ur: NEGATIVE mg/dL
Leukocytes, UA: NEGATIVE
Nitrite: NEGATIVE
PH: 7 (ref 5.0–8.0)
Protein, ur: NEGATIVE mg/dL
SPECIFIC GRAVITY, URINE: 1.006 (ref 1.005–1.030)

## 2017-11-05 LAB — BLOOD GAS, ARTERIAL
ACID-BASE DEFICIT: 4.3 mmol/L — AB (ref 0.0–2.0)
Acid-base deficit: 3.5 mmol/L — ABNORMAL HIGH (ref 0.0–2.0)
BICARBONATE: 20.3 mmol/L (ref 20.0–28.0)
Bicarbonate: 21 mmol/L (ref 20.0–28.0)
DRAWN BY: 22223
DRAWN BY: 234301
Delivery systems: POSITIVE
Expiratory PAP: 7
FIO2: 50
FIO2: 80
Inspiratory PAP: 14
LHR: 8 {breaths}/min
O2 SAT: 88.3 %
O2 SAT: 94.2 %
PCO2 ART: 42.6 mmHg (ref 32.0–48.0)
PCO2 ART: 47.6 mmHg (ref 32.0–48.0)
PEEP: 5 cmH2O
PO2 ART: 63.8 mmHg — AB (ref 83.0–108.0)
Patient temperature: 37
RATE: 14 resp/min
VT: 550 mL
pH, Arterial: 7.311 — ABNORMAL LOW (ref 7.350–7.450)
pH, Arterial: 7.289 — ABNORMAL LOW (ref 7.350–7.450)
pO2, Arterial: 84.1 mmHg (ref 83.0–108.0)

## 2017-11-05 LAB — GLUCOSE, CAPILLARY
GLUCOSE-CAPILLARY: 96 mg/dL (ref 65–99)
Glucose-Capillary: 87 mg/dL (ref 65–99)
Glucose-Capillary: 92 mg/dL (ref 65–99)

## 2017-11-05 LAB — TROPONIN I
Troponin I: <0.03 ng/mL (ref <0.03)
Troponin I: <0.03 ng/mL (ref <0.03)

## 2017-11-05 LAB — MRSA PCR SCREENING: MRSA by PCR: NEGATIVE

## 2017-11-05 LAB — HEPATIC FUNCTION PANEL
ALBUMIN: 4 g/dL (ref 3.5–5.0)
ALK PHOS: 66 U/L (ref 38–126)
ALT: 22 U/L (ref 17–63)
AST: 28 U/L (ref 15–41)
BILIRUBIN TOTAL: 0.7 mg/dL (ref 0.3–1.2)
Bilirubin, Direct: 0.1 mg/dL (ref 0.1–0.5)
Indirect Bilirubin: 0.6 mg/dL (ref 0.3–0.9)
TOTAL PROTEIN: 7.1 g/dL (ref 6.5–8.1)

## 2017-11-05 LAB — I-STAT CG4 LACTIC ACID, ED: Lactic Acid, Venous: 1.54 mmol/L (ref 0.5–1.9)

## 2017-11-05 LAB — LIPASE, BLOOD: Lipase: 32 U/L (ref 11–51)

## 2017-11-05 LAB — BRAIN NATRIURETIC PEPTIDE: B NATRIURETIC PEPTIDE 5: 37 pg/mL (ref 0.0–100.0)

## 2017-11-05 MED ORDER — ACETAMINOPHEN 650 MG RE SUPP
650.0000 mg | RECTAL | Status: DC | PRN
  Administered 2017-11-05: 650 mg via RECTAL

## 2017-11-05 MED ORDER — MIDAZOLAM 50MG/50ML (1MG/ML) PREMIX INFUSION
INTRAVENOUS | Status: AC
  Filled 2017-11-05: qty 50

## 2017-11-05 MED ORDER — ETOMIDATE 2 MG/ML IV SOLN
20.0000 mg | Freq: Once | INTRAVENOUS | Status: AC
  Administered 2017-11-05: 20 mg via INTRAVENOUS

## 2017-11-05 MED ORDER — SODIUM CHLORIDE 0.9 % IV BOLUS (SEPSIS)
1000.0000 mL | Freq: Once | INTRAVENOUS | Status: AC
  Administered 2017-11-05: 1000 mL via INTRAVENOUS

## 2017-11-05 MED ORDER — ENOXAPARIN SODIUM 40 MG/0.4ML ~~LOC~~ SOLN
40.0000 mg | SUBCUTANEOUS | Status: DC
  Administered 2017-11-05 – 2017-11-12 (×8): 40 mg via SUBCUTANEOUS
  Filled 2017-11-05 (×8): qty 0.4

## 2017-11-05 MED ORDER — ORAL CARE MOUTH RINSE
15.0000 mL | OROMUCOSAL | Status: DC
  Administered 2017-11-05 – 2017-11-06 (×9): 15 mL via OROMUCOSAL

## 2017-11-05 MED ORDER — SODIUM CHLORIDE 0.9 % IV BOLUS
1000.0000 mL | Freq: Once | INTRAVENOUS | Status: AC
  Administered 2017-11-05: 1000 mL via INTRAVENOUS

## 2017-11-05 MED ORDER — SODIUM CHLORIDE 0.9 % IV SOLN
0.5000 mg/h | INTRAVENOUS | Status: DC
  Administered 2017-11-05: 0.5 mg/h via INTRAVENOUS
  Filled 2017-11-05: qty 10

## 2017-11-05 MED ORDER — MIDAZOLAM HCL 2 MG/2ML IJ SOLN: 1.0000 mg | INTRAMUSCULAR | Status: DC | PRN

## 2017-11-05 MED ORDER — IOHEXOL 300 MG/ML  SOLN
100.0000 mL | Freq: Once | INTRAMUSCULAR | Status: AC | PRN
  Administered 2017-11-05: 100 mL via INTRAVENOUS

## 2017-11-05 MED ORDER — SUCCINYLCHOLINE CHLORIDE 20 MG/ML IJ SOLN
200.0000 mg | Freq: Once | INTRAMUSCULAR | Status: AC
  Administered 2017-11-05: 200 mg via INTRAVENOUS

## 2017-11-05 MED ORDER — SODIUM CHLORIDE 0.9 % IV SOLN
500.0000 mg | INTRAVENOUS | Status: DC
  Administered 2017-11-05 – 2017-11-07 (×3): 500 mg via INTRAVENOUS
  Filled 2017-11-05 (×4): qty 500

## 2017-11-05 MED ORDER — SODIUM CHLORIDE 0.9 % IV BOLUS
500.0000 mL | Freq: Once | INTRAVENOUS | Status: AC
  Administered 2017-11-05: 500 mL via INTRAVENOUS

## 2017-11-05 MED ORDER — CHLORHEXIDINE GLUCONATE 0.12% ORAL RINSE (MEDLINE KIT)
15.0000 mL | Freq: Two times a day (BID) | OROMUCOSAL | Status: DC
  Administered 2017-11-05 – 2017-11-06 (×3): 15 mL via OROMUCOSAL

## 2017-11-05 MED ORDER — FENTANYL CITRATE (PF) 2500 MCG/50ML IJ SOLN
INTRAMUSCULAR | Status: AC
Start: 2017-11-05 — End: 2017-11-05
  Filled 2017-11-05: qty 50

## 2017-11-05 MED ORDER — SODIUM CHLORIDE 0.9 % IV BOLUS (SEPSIS)
500.0000 mL | Freq: Once | INTRAVENOUS | Status: AC
  Administered 2017-11-05: 500 mL via INTRAVENOUS

## 2017-11-05 MED ORDER — IOPAMIDOL (ISOVUE-300) INJECTION 61%
30.0000 mL | Freq: Once | INTRAVENOUS | Status: AC
  Administered 2017-11-05: 30 mL via ORAL

## 2017-11-05 MED ORDER — FENTANYL BOLUS VIA INFUSION
25.0000 ug | INTRAVENOUS | Status: DC | PRN
  Filled 2017-11-05: qty 25

## 2017-11-05 MED ORDER — SODIUM CHLORIDE 0.9 % IV SOLN
INTRAVENOUS | Status: DC
  Administered 2017-11-05 (×2): via INTRAVENOUS

## 2017-11-05 MED ORDER — SODIUM CHLORIDE 0.9 % IV SOLN
0.0000 ug/h | INTRAVENOUS | Status: DC
  Administered 2017-11-05: 20 ug/h via INTRAVENOUS
  Filled 2017-11-05: qty 50

## 2017-11-05 MED ORDER — SODIUM CHLORIDE 0.9 % IV SOLN
2.0000 g | INTRAVENOUS | Status: DC
  Administered 2017-11-05 – 2017-11-12 (×8): 2 g via INTRAVENOUS
  Filled 2017-11-05: qty 20
  Filled 2017-11-05 (×3): qty 2
  Filled 2017-11-05 (×2): qty 20
  Filled 2017-11-05: qty 2
  Filled 2017-11-05: qty 20
  Filled 2017-11-05: qty 2

## 2017-11-05 MED ORDER — FENTANYL CITRATE (PF) 100 MCG/2ML IJ SOLN
50.0000 ug | Freq: Once | INTRAMUSCULAR | Status: DC
  Administered 2017-11-05: 50 ug via INTRAVENOUS

## 2017-11-05 MED ORDER — NOREPINEPHRINE BITARTRATE 1 MG/ML IV SOLN
0.0000 ug/min | INTRAVENOUS | Status: DC
  Administered 2017-11-05 (×2): 4 ug/min via INTRAVENOUS
  Administered 2017-11-05: 2 ug/min via INTRAVENOUS
  Filled 2017-11-05 (×2): qty 4

## 2017-11-05 MED ORDER — IOPAMIDOL (ISOVUE-300) INJECTION 61%
INTRAVENOUS | Status: AC
  Filled 2017-11-05: qty 30

## 2017-11-05 NOTE — ED Triage Notes (Signed)
Pt had unwitnessed fall in bathroom. Wife states the pt has new onset confusion. Pt vomited and c/o back pain per ems pt's sclera is yellow and o2 sat 90% on nonrebreather.

## 2017-11-05 NOTE — ED Notes (Signed)
RT at the bedside to adjust the vent

## 2017-11-05 NOTE — ED Provider Notes (Signed)
[facility] EMERGENCY DEPARTMENT Provider Note   CSN: [id number] Arrival date & time: 11/05/17  0442   History   Chief Complaint Chief Complaint  Patient presents with  . Shortness of Breath   Level 5 caveat due to acuity of condition HPI Cristian Yang is a 83 y.o. male.  The history is provided by the EMS personnel. The history is limited by the condition of the patient.  Shortness of Breath  This is a new problem. The problem occurs continuously.The problem has been rapidly worsening. Associated symptoms include cough. Pertinent negatives include no headaches and no chest pain.  patient Presents from home with shortness of breath, fall, altered mental status.  Per EMS, patient had an unwitnessed fall in the bathroom.  The wife reported when she checked on him he looked confused.  He did vomit and reported back pain.  Per EMS the patient was hypoxic and was placed on oxygen. No other details are known at this time.  Past Medical History:  Diagnosis Date  . Arthritis   . Chronic back pain   . COPD (chronic obstructive pulmonary disease) ([place])   . Depression   . GERD (gastroesophageal reflux disease)   . Hyperlipidemia   . Hypertension   . Hypothyroidism   . Skin cancer   . Sleep apnea    uses CPAP nightly    Patient Active Problem List   Diagnosis Date Noted  . Medication refill 12/24/2016  . Right ankle pain 12/09/2016  . Ganglion cyst 09/21/2016  . Restless leg 09/21/2016  . Chronic obstructive pulmonary disease ([place]) 04/12/2015  . OSA on CPAP 06/02/2014  . GERD (gastroesophageal reflux disease) 06/02/2014  . Hyperlipidemia 06/02/2014  . Arthritis 06/02/2014  . Hypertension 06/02/2014    Past Surgical History:  Procedure Laterality Date  . CATARACT EXTRACTION W/PHACO Left 02/13/2016   Procedure: CATARACT EXTRACTION PHACO AND INTRAOCULAR LENS PLACEMENT (IOC);  Surgeon: Rutherford Guys, MD;  Location: [facility];  Service: Ophthalmology;  Laterality: Left;  CDE: 9.23    . NECK SURGERY     cervical disc  . PROSTATE SURGERY     TURP, laser  . SKIN CANCER EXCISION  2013  . skin cancer removed     x3        Home Medications    Prior to Admission medications   Medication Sig Start Date End Date Taking? Authorizing Provider  ADVAIR DISKUS 250-50 MCG/DOSE AEPB USE 1 INHALATION BY MOUTH EVERY 12 HOURS. <<RINSE MOUTH AFTER USE>>. 09/19/17   Chesley Mires, MD  albuterol (PROVENTIL) (2.5 MG/3ML) 0.083% nebulizer solution USE 1 VIAL (3 ML) VIA NEBULIZER DAILY DX: J44.9 02/28/17   Chesley Mires, MD  aspirin EC 81 MG tablet Take 81 mg by mouth daily.    [provider]  citalopram (CELEXA) 20 MG tablet Take 1 tablet (20 mg total) by mouth daily. 02/14/17   Rogue Bussing, MD  diclofenac sodium (VOLTAREN) 1 % GEL Apply 2 g topically 4 (four) times daily. 12/23/16   Rogue Bussing, MD  docusate sodium (COLACE) 100 MG capsule Take 100 mg by mouth daily.     [provider]  gabapentin (NEURONTIN) 300 MG capsule TAKE 1 CAPSULE BY MOUTH IN MORNING AND 2 CAPSULES AT NIGHT 10/13/17   Rogue Bussing, MD  hydrochlorothiazide (HYDRODIURIL) 25 MG tablet Take 1 tablet (25 mg total) by mouth daily. 11/04/17   Rogue Bussing, MD  HYDROcodone-acetaminophen (NORCO/VICODIN) 5-325 MG tablet Take 1 tablet every 6 (six)  hours as needed by mouth for moderate pain.    [provider]  levothyroxine (SYNTHROID, LEVOTHROID) 75 MCG tablet Take 1 tablet (75 mcg total) by mouth daily before breakfast. 02/14/17   Rogue Bussing, MD  losartan (COZAAR) 100 MG tablet Take 1 tablet (100 mg total) by mouth daily. 10/18/17   Rogue Bussing, MD  mometasone (NASONEX) 50 MCG/ACT nasal spray Place 2 sprays into the nose daily. 02/28/17   Chesley Mires, MD  montelukast (SINGULAIR) 10 MG tablet Take 1 tablet (10 mg total) by mouth at bedtime. 02/28/17   Chesley Mires, MD  Multiple Vitamin (MULTIVITAMIN WITH MINERALS) TABS tablet Take  1 tablet by mouth daily.    [provider]  omeprazole (PRILOSEC) 20 MG capsule Take 1 capsule (20 mg total) by mouth daily. 10/21/17   Rogue Bussing, MD  Tiotropium Bromide Monohydrate (SPIRIVA RESPIMAT) 2.5 MCG/ACT AERS Inhale 2 puffs into the lungs daily. 02/28/17   Chesley Mires, MD    Family History Family History  Problem Relation Age of Onset  . Diabetes Mother   . Heart disease Mother   . Liver cancer Brother   . Bone cancer Sister   . Asthma Father     Social History Social History   Tobacco Use  . Smoking status: Former Smoker    Packs/day: 2.00    Years: 30.00    Pack years: 60.00    Types: Cigarettes    Last attempt to quit: 07/23/2003    Years since quitting: 14.2  . Smokeless tobacco: Never Used  Substance Use Topics  . Alcohol use: No    Alcohol/week: 0.0 oz    Comment: quit 2005  . Drug use: No     Allergies   Ciprofloxacin and Codeine   Review of Systems Review of Systems  Unable to perform ROS: Acuity of condition  Respiratory: Positive for cough and shortness of breath.   Cardiovascular: Negative for chest pain.  Neurological: Negative for headaches.     Physical Exam Updated Vital Signs BP 132/72   Pulse 83   Temp 98.4 F (36.9 C) (Rectal)   Resp (!) 35   Ht 1.727 m (5\' 8" )   Wt 107 kg (236 lb)   SpO2 (!) 87%   BMI 35.88 kg/m   Physical Exam  CONSTITUTIONAL: Elderly and ill-appearing HEAD: Normocephalic/atraumatic, no signs of trauma EYES: EOMI/PERRL ENMT: Mucous membranes moist NECK: supple no meningeal signs, mask in place SPINE/BACK: Lumbar tenderness, no bruising/crepitance/stepoffs noted to spine, cervical thoracic tenderness CV: S1/S2 noted, no murmurs/rubs/gallops noted LUNGS: Tachypnea, wheezing bilaterally Chest-no tenderness or crepitus ABDOMEN: soft, nontender GU:no cva tenderness NEURO: Pt is awake/alert, moves all extremitiesx4.  No facial droop.   EXTREMITIES: pulses normal/equal but feet cool  to touch, tenderness with range of motion of right hip, no deformities noted SKIN: Skin is cool to touch PSYCH: Anxious  ED Treatments / Results  Labs (all labs ordered are listed, but only abnormal results are displayed) Labs Reviewed  BLOOD GAS, ARTERIAL - Abnormal; Notable for the following components:      Result Value   pH, Arterial 7.311 (*)    pO2, Arterial 63.8 (*)    Acid-base deficit 4.3 (*)    All other components within normal limits  BASIC METABOLIC PANEL - Abnormal; Notable for the following components:   Sodium 126 (*)    Chloride 88 (*)    Glucose, Bld 129 (*)    Creatinine, Ser 1.27 (*)  GFR calc non Af Amer 51 (*)    GFR calc Af Amer 59 (*)    All other components within normal limits  CBC WITH DIFFERENTIAL/PLATELET - Abnormal; Notable for the following components:   WBC 16.9 (*)    Neutro Abs 14.5 (*)    All other components within normal limits  CULTURE, BLOOD (ROUTINE X 2)  CULTURE, BLOOD (ROUTINE X 2)  URINE CULTURE  BRAIN NATRIURETIC PEPTIDE  TROPONIN I  URINALYSIS, ROUTINE W REFLEX MICROSCOPIC  HEPATIC FUNCTION PANEL  LIPASE, BLOOD  BLOOD GAS, ARTERIAL  I-STAT CG4 LACTIC ACID, ED  I-STAT CG4 LACTIC ACID, ED    EKG EKG Interpretation  Date/Time:  Wednesday November 05 2017 04:49:04 EDT Ventricular Rate:  81 PR Interval:    QRS Duration: 148 QT Interval:  444 QTC Calculation: 516 R Axis:   -86 Text Interpretation:  Sinus rhythm RBBB and LAFB Left ventricular hypertrophy Baseline wander in lead(s) III Interpretation limited secondary to artifact Abnormal ekg Confirmed by Ripley Fraise [phone]) on 11/05/2017 5:09:17 AM   Radiology Dg Pelvis Portable  Result Date: 11/05/2017 CLINICAL DATA:  Status post unwitnessed fall, with concern for pelvic injury. Initial encounter. EXAM: PORTABLE PELVIS 1-2 VIEWS COMPARISON:  None. FINDINGS: There is no evidence of fracture or dislocation. Both femoral heads are seated normally within their respective  acetabula. Mild degenerative change is noted at the lower lumbar spine. The sacroiliac joints are unremarkable in appearance. The visualized bowel gas pattern is grossly unremarkable in appearance. IMPRESSION: No evidence of fracture or dislocation. Electronically Signed   By: Garald Balding M.D.   On: 11/05/2017 05:41   Dg Chest Port 1 View  Result Date: 11/05/2017 CLINICAL DATA:  Status post unwitnessed fall. Shortness of breath. Initial encounter. EXAM: PORTABLE CHEST 1 VIEW COMPARISON:  Chest radiograph performed 10/25/2017 FINDINGS: The lungs are well-aerated. Left-sided airspace opacification is compatible with pneumonia. There is no evidence of pleural effusion or pneumothorax. The cardiomediastinal silhouette is within normal limits. No acute osseous abnormalities are seen. Cervical spinal fusion hardware is partially imaged. IMPRESSION: Significant left-sided pneumonia noted. Electronically Signed   By: Garald Balding M.D.   On: 11/05/2017 05:36   Dg Chest Port 1v Same Day  Result Date: 11/05/2017 CLINICAL DATA:  Endotracheal tube placement. EXAM: PORTABLE CHEST 1 VIEW COMPARISON:  Chest radiograph performed earlier today at 5:06 a.m. FINDINGS: [name] endotracheal tube is seen ending 3-4 cm above the carina. An enteric tube is noted extending about the gastroesophageal junction. Persistent left-sided pneumonia is noted. The lungs are mildly hypoexpanded. No pleural effusion or pneumothorax is seen. The cardiomediastinal silhouette is normal in size. No acute osseous abnormalities are seen. Cervical spinal fusion hardware is partially imaged. IMPRESSION: 1. Endotracheal tube seen ending 3-4 cm above the carina. 2. Persistent left-sided pneumonia.  Lungs mildly hypoexpanded. Electronically Signed   By: Garald Balding M.D.   On: 11/05/2017 06:41    Procedures .Critical Care Performed by: Ripley Fraise, MD Authorized by: Ripley Fraise, MD   Critical care provider statement:     Critical care time (minutes):  63   Critical care start time:  11/05/2017 5:16 AM   Critical care end time:  11/05/2017 6:19 AM   Critical care time was exclusive of:  Separately billable procedures and treating other patients   Critical care was necessary to treat or prevent imminent or life-threatening deterioration of the following conditions:  Respiratory failure, circulatory failure and shock   Critical care was time spent personally  by me on the following activities:  Development of treatment plan with patient or surrogate, ordering and performing treatments and interventions, ordering and review of laboratory studies, ordering and review of radiographic studies, pulse oximetry, re-evaluation of patient's condition, review of old charts, ventilator management, examination of patient and evaluation of patient's response to treatment  Procedure Name: Intubation Date/Time: 11/05/2017 6:00 AM Performed by: Ripley Fraise, MD Pre-anesthesia Checklist: Emergency Drugs available Oxygen Delivery Method: Non-rebreather mask Preoxygenation: Pre-oxygenation with 100% oxygen Induction Type: Rapid sequence Laryngoscope Size: Glidescope Grade View: Grade I Tube size: 7.5 mm Number of attempts: 1 Placement Confirmation: ETT inserted through vocal cords under direct vision and CO2 detector Secured at: 22 cm Tube secured with: ETT holder        Medications Ordered in ED Medications  cefTRIAXone (ROCEPHIN) 2 g in sodium chloride 0.9 % 100 mL IVPB (0 g Intravenous Stopped 11/05/17 0624)  azithromycin (ZITHROMAX) 500 mg in sodium chloride 0.9 % 250 mL IVPB (0 mg Intravenous Stopped 11/05/17 0659)  fentaNYL (SUBLIMAZE) 2,500 mcg in sodium chloride 0.9 % 250 mL (10 mcg/mL) infusion (20 mcg/hr Intravenous New Bag/Given 11/05/17 0645)  fentaNYL (SUBLIMAZE) bolus via infusion 25 mcg (has no administration in time range)  midazolam (VERSED) 50 mg in sodium chloride 0.9 % 50 mL (1 mg/mL) infusion (0.5 mg/hr  Intravenous New Bag/Given 11/05/17 0648)  norepinephrine (LEVOPHED) 4 mg in dextrose 5 % 250 mL (0.016 mg/mL) infusion (has no administration in time range)  sodium chloride 0.9 % bolus 1,000 mL (1,000 mLs Intravenous New Bag/Given 11/05/17 0748)  sodium chloride 0.9 % bolus 1,000 mL (0 mLs Intravenous Stopped 11/05/17 0658)    And  sodium chloride 0.9 % bolus 1,000 mL (0 mLs Intravenous Stopped 11/05/17 0658)    And  sodium chloride 0.9 % bolus 1,000 mL (0 mLs Intravenous Stopped 11/05/17 0659)    And  sodium chloride 0.9 % bolus 500 mL (0 mLs Intravenous Stopped 11/05/17 0607)  succinylcholine (ANECTINE) injection 200 mg (200 mg Intravenous Given 11/05/17 0553)  etomidate (AMIDATE) injection 20 mg (20 mg Intravenous Given 11/05/17 0553)     Initial Impression / Assessment and Plan / ED Course  I have reviewed the triage vital signs and the nursing notes.  Pertinent labs & imaging results that were available during my care of the patient were reviewed by me and considered in my medical decision making (see chart for details).     5:16 AM Patient seen soon after arrival for altered mental status and shortness of breath.  There is report that he fell and was confused.  This appears to be more respiratory driven, as he has a history of COPD.  He is placed on BiPAP with some improvement.  He is now answering appropriately with questions, no focal weakness noted.  He denies headache or neck pain.  No chest or abdominal pain.  He reports mostly has pain in his low back.  Will monitor closely.  Pelvic x-ray done just to ensure there was no obvious hip injury.  He reported low back pain, but no other spinal tenderness.  Will defer spinal imaging at this time as patient is critically ill.  He has no focal neuro deficits to suggest acute cord injury 6:18 AM Patient became abruptly hypotensive.  His blood pressures were in the 50s and 60s.  He was nonresponsive to verbal or physical stimuli.  I elected to  emergently intubate patient.  I did review the chest x-ray prior to intubation, and  he had an obvious left-sided pneumonia.  He never lost pulses, and never required CPR.  He was intubated without difficulty Code sepsis had already been called, and he was receiving IV fluids and IV antibiotics. Blood pressures are now improved. I spoke to his wife, and gave her an update on his current condition.  I told her the critical condition that he is currently in. Wife Cristian Yang [phone] 6:49 AM Discussed the case with critical care at [facility].  Dr. Concepcion Living Patient can be managed at [facility] given improving blood pressures, and his improving mental status Patient is now more awake after intubation and his blood pressures are above 332 systolic Consult hospitalist for admission here at [facility] 7:20 AM Patient improved.  Vital signs improved, blood pressure of 100. He is responsive to voice. I discussed the case with Dr. Jerilee Hoh with hospitalist. She will evaluate the patient.  He may need to be transferred to [facility] to either [place]. 8:01 AM Patient Began to deteriorate.  Blood pressure is now low.  Another liter of fluid is been ordered.  His sedation has been scaled back.  IV Levophed is been ordered Discussed with Dr. Nelda Marseille critical-care. Plan will be to admit to ICU at [place] long I have attempted to call the wife he has a phone number listed, I have left her message refer to call back as soon as possible Final Clinical Impressions(s) / ED Diagnoses   Final diagnoses:  Community acquired pneumonia of left upper lobe of lung ([place])  Acute respiratory failure with hypoxia ([place])  Hypotension, unspecified hypotension type  Sepsis, due to unspecified organism [facility])    ED Discharge Orders    None       Ripley Fraise, MD 11/05/17 [phone]

## 2017-11-05 NOTE — Progress Notes (Signed)
CPT held at this time. Bedside RN working with patient.

## 2017-11-05 NOTE — Telephone Encounter (Signed)
Pt wife called and wants to talk to Dr Ola Spurr ASAP, [place] was admitted to the hospital early this morning. He is currently in ICU. He was told he has pneumonia. His wife said he had an appointment yesterday and they only discussed his blood sugar. She said she wasn't able to be at the appointment and wants to know if he was possibly diagnosed and he did not tell her. She said there is also a chance that he did not have his hearing aid on.

## 2017-11-05 NOTE — Telephone Encounter (Signed)
Returned call but reached voicemail. Left message for Ms. Bohr that patient looked well at his appointment yesterday. We had just discussed his blood pressure, which was controlled. His lungs sounded clear on exam yesterday, he was afebrile, and he was maintaining his oxygen saturation on room air. We were able to talk without issue, so there was no issue with hearing aids at that time. Asked her to please call back if she has more questions and wished Mr. Junious a fast recovery.   Olene Floss, MD [place], PGY-3

## 2017-11-05 NOTE — ED Notes (Signed)
[facility] here for transport, report given to [employer]

## 2017-11-05 NOTE — ED Notes (Signed)
Dentures, glasses, underwear and med list in belonging bag sent with [facility]

## 2017-11-05 NOTE — Assessment & Plan Note (Signed)
-   At goal of <150/90 today. Continue losartan 100 mg and hctz 25 mg daily.  - Recommended limiting sugary beverages and increasing physical activity with walking around property as able as lifestyle factors that could help with weight

## 2017-11-05 NOTE — ED Notes (Signed)
Pt is alert and calm, writing on clip board, wanting Bed pan, BP trending down, MD aware, orders given. Waiting on pharmacy.

## 2017-11-05 NOTE — ED Notes (Signed)
Pt's BP reading 71/44 on monitor. Repositioned cuff and re-took BP. BP reading 60/45. Dr Christy Gentles notified and new orders given and carried out accordingly.

## 2017-11-05 NOTE — Telephone Encounter (Signed)
Spoke with patient's wife Emerson Monte regarding her husband's current admission. She wanted to know what we had discussed at appointment yesterday because she was worried patient hadn't told her about the pneumonia. She says she didn't know what to tell EMS because patient fell while she was sleeping. Let Mrs. Pannone know we had discussed blood pressure and that patient appeared well with clear breath sounds and good oxygen saturation. Mrs. Schwabe says there may have been some vomit in the bathroom last night and that she had seen spit-up tums in patient's CPAP mask. I let her know I would continue to follow patient's course and that I hope he has a fast recovery. Mrs. Amerman hopes to get a ride from her brother to be able to visit [facility] ICU tomorrow.   Olene Floss, MD [place], PGY-3

## 2017-11-05 NOTE — Progress Notes (Signed)
[facility]Link Physician-Brief Progress Note Patient Name: Cristian Yang DOB: April 15, 1935 MRN: [id number]   Date of Service  11/05/2017  HPI/Events of Note  Fever with temp > 101  eICU Interventions  Tylenol supp 650 mg q 4 hrs prn fever        Frederik Pear 11/05/2017, 11:50 PM

## 2017-11-05 NOTE — H&P (Signed)
PULMONARY / CRITICAL CARE MEDICINE   Name: Cristian Yang MRN: [id number] DOB: Jan 16, 1935    ADMISSION DATE:  11/05/2017 CONSULTATION DATE:  11/05/2017  REFERRING MD:  Christy Gentles, ED [name]  CHIEF COMPLAINT:  Dyspnea.  HISTORY OF PRESENT ILLNESS:   History obtained from Epic as patient sedated and mechanically ventilated.  83 year old man with respiratory history of COPD and OSA on CPAP. Found down in bathroom by wife confused and short of breath.  Possible vomiting at site. Denied chest pain or headache.  PAST MEDICAL HISTORY :  He  has a past medical history of Arthritis, Chronic back pain, COPD (chronic obstructive pulmonary disease) ([place]), Depression, GERD (gastroesophageal reflux disease), Hyperlipidemia, Hypertension, Hypothyroidism, Skin cancer, and Sleep apnea.  PAST SURGICAL HISTORY: He  has a past surgical history that includes Skin cancer excision (2013); Prostate surgery; Neck surgery; skin cancer removed; and Cataract extraction w/PHACO (Left, 02/13/2016).  Allergies  Allergen Reactions  . Ciprofloxacin Nausea And Vomiting  . Codeine Nausea And Vomiting    No current facility-administered medications on file prior to encounter.    Current Outpatient Medications on File Prior to Encounter  Medication Sig  . ADVAIR DISKUS 250-50 MCG/DOSE AEPB USE 1 INHALATION BY MOUTH EVERY 12 HOURS. <<RINSE MOUTH AFTER USE>>.  . albuterol (PROVENTIL) (2.5 MG/3ML) 0.083% nebulizer solution USE 1 VIAL (3 ML) VIA NEBULIZER DAILY DX: J44.9  . aspirin EC 81 MG tablet Take 81 mg by mouth daily.  . citalopram (CELEXA) 20 MG tablet Take 1 tablet (20 mg total) by mouth daily.  . diclofenac sodium (VOLTAREN) 1 % GEL Apply 2 g topically 4 (four) times daily.  Marland Kitchen docusate sodium (COLACE) 100 MG capsule Take 100 mg by mouth daily.   Marland Kitchen gabapentin (NEURONTIN) 300 MG capsule TAKE 1 CAPSULE BY MOUTH IN MORNING AND 2 CAPSULES AT NIGHT  . hydrochlorothiazide (HYDRODIURIL) 25 MG tablet Take 1 tablet (25  mg total) by mouth daily.  Marland Kitchen HYDROcodone-acetaminophen (NORCO/VICODIN) 5-325 MG tablet Take 1 tablet every 6 (six) hours as needed by mouth for moderate pain.  Marland Kitchen levothyroxine (SYNTHROID, LEVOTHROID) 75 MCG tablet Take 1 tablet (75 mcg total) by mouth daily before breakfast.  . losartan (COZAAR) 100 MG tablet Take 1 tablet (100 mg total) by mouth daily.  . mometasone (NASONEX) 50 MCG/ACT nasal spray Place 2 sprays into the nose daily.  . montelukast (SINGULAIR) 10 MG tablet Take 1 tablet (10 mg total) by mouth at bedtime.  . Multiple Vitamin (MULTIVITAMIN WITH MINERALS) TABS tablet Take 1 tablet by mouth daily.  Marland Kitchen omeprazole (PRILOSEC) 20 MG capsule Take 1 capsule (20 mg total) by mouth daily.  . Tiotropium Bromide Monohydrate (SPIRIVA RESPIMAT) 2.5 MCG/ACT AERS Inhale 2 puffs into the lungs daily.    FAMILY HISTORY:  His indicated that the status of his mother is unknown. He indicated that the status of his father is unknown. He indicated that the status of his sister is unknown. He indicated that the status of his brother is unknown.   SOCIAL HISTORY: He  reports that he quit smoking about 14 years ago. His smoking use included cigarettes. He has a 60.00 pack-year smoking history. He has never used smokeless tobacco. He reports that he does not drink alcohol or use drugs.  REVIEW OF SYSTEMS:   Pertinent positives are in HPI. Remaining ROS is negative.  SUBJECTIVE:  Intubated for respiratory failure at [facility]: BP (!) 88/55 (BP Location: Left Arm)   Pulse Marland Kitchen)  56   Temp 98.4 F (36.9 C) (Rectal)   Resp 18   Ht 5\' 8"  (1.727 m)   Wt 236 lb (107 kg)   SpO2 98%   BMI 35.88 kg/m   HEMODYNAMICS:    VENTILATOR SETTINGS: Vent Mode: PRVC FiO2 (%):  [80 %-100 %] 80 % Set Rate:  [14 bmp-18 bmp] 18 bmp Vt Set:  [550 mL] 550 mL PEEP:  [5 cmH20] 5 cmH20 Plateau Pressure:  [21 cmH20-30 cmH20] 30 cmH20  INTAKE / OUTPUT: I/O last 3 completed shifts: In: 42 [IV  Piggyback:3850] Out: -   PHYSICAL EXAMINATION: General:  Well nourished male, appears stated age. Neuro:  On fentanyl and midazolam infusions. Somnolent but wakes to follow commands and nods appropriately. Moves all limbs to command. HEENT:  7.25mm ETT in situ. No scleral pallor or icterus. Cardiovascular:  On NE at 68mcg/min. Extremities warm. No edema. JVP not visible. HS normal/ Lungs:  PRVC ventilation with no dyssynchrony. Vesicular breath sounds throughout with diffuse rhonchi. Suctioning copious tan secretions. Abdomen:  Copious [place] 6 stool. Abdomen distended and tympanic, BS rare. OGT in place. GU: Foley in place. Dark tea colored urine. Musculoskeletal:  Normal muscle mass. No apparent injuries. Skin:  Intact. No abrasions from fall.  LABS:  BMET Recent Labs  Lab 11/05/17 0504  NA 126*  K 3.8  CL 88*  CO2 25  BUN 14  CREATININE 1.27*  GLUCOSE 129*    Electrolytes Recent Labs  Lab 11/05/17 0504  CALCIUM 9.4    CBC Recent Labs  Lab 11/05/17 0504  WBC 16.9*  HGB 14.9  HCT 43.9  PLT 289    Coag's No results for input(s): APTT, INR in the last 168 hours.  Sepsis Markers Recent Labs  Lab 11/05/17 0615  LATICACIDVEN 1.54    ABG Recent Labs  Lab 11/05/17 0505 11/05/17 0810  PHART 7.311* 7.289*  PCO2ART 42.6 47.6  PO2ART 63.8* 84.1    Liver Enzymes Recent Labs  Lab 11/05/17 0504  AST 28  ALT 22  ALKPHOS 66  BILITOT 0.7  ALBUMIN 4.0    Cardiac Enzymes Recent Labs  Lab 11/05/17 0504  TROPONINI <0.03    Glucose No results for input(s): GLUCAP in the last 168 hours.  Imaging Dg Pelvis Portable  Result Date: 11/05/2017 CLINICAL DATA:  Status post unwitnessed fall, with concern for pelvic injury. Initial encounter. EXAM: PORTABLE PELVIS 1-2 VIEWS COMPARISON:  None. FINDINGS: There is no evidence of fracture or dislocation. Both femoral heads are seated normally within their respective acetabula. Mild degenerative change is noted at  the lower lumbar spine. The sacroiliac joints are unremarkable in appearance. The visualized bowel gas pattern is grossly unremarkable in appearance. IMPRESSION: No evidence of fracture or dislocation. Electronically Signed   By: Garald Balding M.D.   On: 11/05/2017 05:41   Dg Chest Port 1 View  Result Date: 11/05/2017 CLINICAL DATA:  Status post unwitnessed fall. Shortness of breath. Initial encounter. EXAM: PORTABLE CHEST 1 VIEW COMPARISON:  Chest radiograph performed 10/25/2017 FINDINGS: The lungs are well-aerated. Left-sided airspace opacification is compatible with pneumonia. There is no evidence of pleural effusion or pneumothorax. The cardiomediastinal silhouette is within normal limits. No acute osseous abnormalities are seen. Cervical spinal fusion hardware is partially imaged. IMPRESSION: Significant left-sided pneumonia noted. Electronically Signed   By: Garald Balding M.D.   On: 11/05/2017 05:36   Dg Chest Port 1v Same Day  Result Date: 11/05/2017 CLINICAL DATA:  Endotracheal tube placement. EXAM: PORTABLE CHEST  1 VIEW COMPARISON:  Chest radiograph performed earlier today at 5:06 a.m. FINDINGS: [name] endotracheal tube is seen ending 3-4 cm above the carina. An enteric tube is noted extending about the gastroesophageal junction. Persistent left-sided pneumonia is noted. The lungs are mildly hypoexpanded. No pleural effusion or pneumothorax is seen. The cardiomediastinal silhouette is normal in size. No acute osseous abnormalities are seen. Cervical spinal fusion hardware is partially imaged. IMPRESSION: 1. Endotracheal tube seen ending 3-4 cm above the carina. 2. Persistent left-sided pneumonia.  Lungs mildly hypoexpanded. Electronically Signed   By: Garald Balding M.D.   On: 11/05/2017 06:41     STUDIES:  Bedside echo shows normal LV/RV size and function.   CULTURES: Pending  ANTIBIOTICS: On azithromycin and ceftriaxone for CAP.  SIGNIFICANT EVENTS: Transferred from  [facility]  LINES/TUBES: PIV, ETT, Foley, OGT.  DISCUSSION: 83 year old seemingly well man who presented with acute dyspnea following fall at home. Unclear which was initial event. CT head and EKG/troponin to rule no respiratory cause of syncope. Most likely circumstance is fall due to hypoxia from pneumonia.  ASSESSMENT / PLAN:  PULMONARY A: Acute respiratory failure due to CAP requiring mechanical ventilation. P:    Lung protective ventilation. Adequate gas exchange on nominal setting. VAP bundle Chest physiotherapy.   CARDIOVASCULAR A:  Hypotension due to septic shock. Still appears volume responsive based on BP response to fluids.  P:  - Continue vasopressors.  - Rule out cardiac cause of fall. - Judicious use of fluids as has already received 5L  RENAL A:   AKI with hypovolemic hyponatremia.  P:   Continue fluid resuscitation Follow serum sodium Foley required to measure urine output precisely.  GASTROINTESTINAL A:   Unexplained vomiting.  Adequately nourished at baseline. P:   Continue NGT suction CT abdomen. NPO for now   HEMATOLOGIC A:   High DVT risk. P:  Lovenox for VTE prevention  INFECTIOUS A:   Leukocytosis due to pneumonia with septic shock P:   Continue Ceftriaxone and azithromycin. Ensure lactate remains negative.  ENDOCRINE A:   Euglycemic on no therapy    P:   SSI coverage if needed.  NEUROLOGIC A:   Comfortable with no delirium at present. P:   RASS goal: 0 to -1 Switch midazolam to intermittent dosing post CT>   FAMILY  - Updates:  Family informed by RN over the phone. They are on their way in.  - Inter-disciplinary family meet or Palliative Care meeting due by:  day [place], MD Pulmonary and [place] Pager: [phone] after hours: [phone]  11/05/2017, 10:54 AM

## 2017-11-05 NOTE — Progress Notes (Signed)
Notified MD Agarwala in regards to patient vomiting after giving oral contrast. MD Agarwala ordered to hold the other half of oral contrast for now. Will still proceed with CT scan.

## 2017-11-06 DIAGNOSIS — J9601 Acute respiratory failure with hypoxia: Secondary | ICD-10-CM

## 2017-11-06 LAB — COMPREHENSIVE METABOLIC PANEL
ALT: 20 U/L (ref 17–63)
AST: 30 U/L (ref 15–41)
Albumin: 2.9 g/dL — ABNORMAL LOW (ref 3.5–5.0)
Alkaline Phosphatase: 41 U/L (ref 38–126)
Anion gap: 8 (ref 5–15)
BUN: 20 mg/dL (ref 6–20)
CHLORIDE: 102 mmol/L (ref 101–111)
CO2: 20 mmol/L — AB (ref 22–32)
CREATININE: 1.17 mg/dL (ref 0.61–1.24)
Calcium: 7.9 mg/dL — ABNORMAL LOW (ref 8.9–10.3)
GFR calc non Af Amer: 57 mL/min — ABNORMAL LOW (ref >60)
Glucose, Bld: 98 mg/dL (ref 65–99)
POTASSIUM: 4.5 mmol/L (ref 3.5–5.1)
SODIUM: 130 mmol/L — AB (ref 135–145)
Total Bilirubin: 0.6 mg/dL (ref 0.3–1.2)
Total Protein: 5.7 g/dL — ABNORMAL LOW (ref 6.5–8.1)

## 2017-11-06 LAB — URINE CULTURE: CULTURE: NO GROWTH

## 2017-11-06 LAB — CBC WITH DIFFERENTIAL/PLATELET
BAND NEUTROPHILS: 18 %
BASOS PCT: 0 %
Basophils Absolute: 0 10*3/uL (ref 0.0–0.1)
Blasts: 0 %
EOS ABS: 0 10*3/uL (ref 0.0–0.7)
Eosinophils Relative: 0 %
HCT: 36.6 % — ABNORMAL LOW (ref 39.0–52.0)
Hemoglobin: 12.5 g/dL — ABNORMAL LOW (ref 13.0–17.0)
Lymphocytes Relative: 7 %
Lymphs Abs: 2.2 10*3/uL (ref 0.7–4.0)
MCH: 32 pg (ref 26.0–34.0)
MCHC: 34.2 g/dL (ref 30.0–36.0)
MCV: 93.6 fL (ref 78.0–100.0)
MONO ABS: 1 10*3/uL (ref 0.1–1.0)
MYELOCYTES: 0 %
Metamyelocytes Relative: 2 %
Monocytes Relative: 3 %
Neutro Abs: 28.5 10*3/uL — ABNORMAL HIGH (ref 1.7–7.7)
Neutrophils Relative %: 70 %
Other: 0 %
PLATELETS: 262 10*3/uL (ref 150–400)
PROMYELOCYTES RELATIVE: 0 %
RBC: 3.91 MIL/uL — ABNORMAL LOW (ref 4.22–5.81)
RDW: 13.4 % (ref 11.5–15.5)
WBC MORPHOLOGY: INCREASED
WBC: 31.7 10*3/uL — ABNORMAL HIGH (ref 4.0–10.5)
nRBC: 0 /100 WBC

## 2017-11-06 LAB — GLUCOSE, CAPILLARY
GLUCOSE-CAPILLARY: 83 mg/dL (ref 65–99)
GLUCOSE-CAPILLARY: 103 mg/dL — AB (ref 65–99)
Glucose-Capillary: 92 mg/dL (ref 65–99)
Glucose-Capillary: 97 mg/dL (ref 65–99)
Glucose-Capillary: 92 mg/dL (ref 65–99)
Glucose-Capillary: 98 mg/dL (ref 65–99)

## 2017-11-06 LAB — TROPONIN I: Troponin I: <0.03 ng/mL

## 2017-11-06 MED ORDER — FAMOTIDINE IN NACL 20-0.9 MG/50ML-% IV SOLN
20.0000 mg | Freq: Two times a day (BID) | INTRAVENOUS | Status: DC
  Administered 2017-11-06 (×2): 20 mg via INTRAVENOUS
  Filled 2017-11-06 (×2): qty 50

## 2017-11-06 MED ORDER — ALBUTEROL SULFATE (2.5 MG/3ML) 0.083% IN NEBU
2.5000 mg | INHALATION_SOLUTION | RESPIRATORY_TRACT | Status: DC | PRN
  Administered 2017-11-08: 2.5 mg via RESPIRATORY_TRACT
  Filled 2017-11-06: qty 3

## 2017-11-06 MED ORDER — IPRATROPIUM-ALBUTEROL 0.5-2.5 (3) MG/3ML IN SOLN
3.0000 mL | Freq: Four times a day (QID) | RESPIRATORY_TRACT | Status: DC
  Administered 2017-11-06 – 2017-11-07 (×4): 3 mL via RESPIRATORY_TRACT
  Filled 2017-11-06 (×4): qty 3

## 2017-11-06 NOTE — Progress Notes (Addendum)
PULMONARY / CRITICAL CARE MEDICINE   Name: Cristian Yang MRN: [id number] DOB: 07/11/1935    ADMISSION DATE:  11/05/2017 CONSULTATION DATE:  11/05/2017  REFERRING MD:  Christy Gentles, ED [name]  CHIEF COMPLAINT:  Dyspnea.  BRIEF SUMMARY:   [name] with PMH of COPD, OSA on CPAP admitted 4/17 after being found down in the bathroom by his wife confused and short of breath. There were concerns for vomiting & diarrhea.  He was intubated for airway protection.  Concern for CAP with hypoxia vs syncope episode.    SUBJECTIVE:  Wife reported to RN that the patient has such bad reflux that he regularly will have emesis into his CPAP machine.  He has to take multiple agents to control his reflux and it is worse at night. Pt remains on 50 mcg's fentanyl.  Awake/alert on PSV.    VITAL SIGNS: BP (!) 126/100 (BP Location: Left Arm)   Pulse 73   Temp 98.7 F (37.1 C) (Oral)   Resp 15   Ht 5\' 8"  (1.727 m)   Wt 250 lb 14.1 oz (113.8 kg)   SpO2 94%   BMI 38.15 kg/m   HEMODYNAMICS:    VENTILATOR SETTINGS: Vent Mode: PRVC FiO2 (%):  [50 %-80 %] 50 % Set Rate:  [20 bmp] 20 bmp Vt Set:  [550 mL] 550 mL PEEP:  [5 cmH20] 5 cmH20 Plateau Pressure:  [20 cmH20-30 cmH20] 20 cmH20  INTAKE / OUTPUT: I/O last 3 completed shifts: In: 7501.4 [I.V.:1801.4; IV Piggyback:5700] Out: 1500 [Urine:1500]  PHYSICAL EXAMINATION: General: elderly male in NAD on vent  HEENT: MM pink/moist, ETT  Neuro: Awakens to voice, follows commands, MAE, pinpoint pupils CV: s1s2 rrr, no m/r/g PULM: even/non-labored, lungs bilaterally clear OH:YWVP, non-tender, bsx4 active  Extremities: warm/dry, no edema  Skin: no rashes or lesions  LABS:  BMET Recent Labs  Lab 11/05/17 0504 11/06/17 0259  NA 126* 130*  K 3.8 4.5  CL 88* 102  CO2 25 20*  BUN 14 20  CREATININE 1.27* 1.17  GLUCOSE 129* 98    Electrolytes Recent Labs  Lab 11/05/17 0504 11/06/17 0259  CALCIUM 9.4 7.9*    CBC Recent Labs  Lab  11/05/17 0504 11/06/17 0259  WBC 16.9* 31.7*  HGB 14.9 12.5*  HCT 43.9 36.6*  PLT 289 262    Coag's No results for input(s): APTT, INR in the last 168 hours.  Sepsis Markers Recent Labs  Lab 11/05/17 0615  LATICACIDVEN 1.54    ABG Recent Labs  Lab 11/05/17 0505 11/05/17 0810  PHART 7.311* 7.289*  PCO2ART 42.6 47.6  PO2ART 63.8* 84.1    Liver Enzymes Recent Labs  Lab 11/05/17 0504 11/06/17 0259  AST 28 30  ALT 22 20  ALKPHOS 66 41  BILITOT 0.7 0.6  ALBUMIN 4.0 2.9*    Cardiac Enzymes Recent Labs  Lab 11/05/17 1130 11/05/17 2034 11/06/17 0259  TROPONINI <0.03 <0.03 <0.03    Glucose Recent Labs  Lab 11/05/17 1523 11/05/17 1934 11/05/17 2323 11/06/17 0330  GLUCAP 87 92 96 92    Imaging Dg Abd 1 View  Result Date: 11/06/2017 CLINICAL DATA:  Orogastric tube placement. EXAM: ABDOMEN - 1 VIEW COMPARISON:  CT of the abdomen and pelvis performed earlier today at 4:18 p.m. FINDINGS: [name] enteric tube is noted ending overlying the body of the stomach. The visualized bowel gas pattern is unremarkable. Scattered air and stool filled loops of colon are seen; no abnormal dilatation of small bowel loops  is seen to suggest small bowel obstruction. No free intra-abdominal air is identified, though evaluation for free air is limited on a single supine view. The visualized osseous structures are within normal limits; the sacroiliac joints are unremarkable in appearance. Mild left basilar atelectasis is noted. IMPRESSION: 1. Enteric tube noted ending overlying the body of the stomach. 2. Mild left basilar atelectasis noted. Electronically Signed   By: Garald Balding M.D.   On: 11/06/2017 00:34   Dg Abd 1 View  Result Date: 11/05/2017 CLINICAL DATA:  Orogastric tube placement. EXAM: ABDOMEN - 1 VIEW COMPARISON:  Radiographs of Dec 10, 2015. FINDINGS: The bowel gas pattern is normal. Distal tip of orogastric tube is seen in expected position of proximal stomach.  IMPRESSION: Distal tip of orogastric tube seen in expected position of proximal stomach. No evidence of bowel obstruction or ileus. Electronically Signed   By: Marijo Conception, M.D.   On: 11/05/2017 11:46   Ct Head Wo Contrast  Result Date: 11/05/2017 CLINICAL DATA:  Altered level of consciousness. EXAM: CT HEAD WITHOUT CONTRAST TECHNIQUE: Contiguous axial images were obtained from the base of the skull through the vertex without intravenous contrast. COMPARISON:  None. FINDINGS: Brain: Generalized atrophy with moderate chronic microvascular ischemia in the white matter. No acute infarct, hemorrhage, or mass. Negative for hydrocephalus or shift of the midline structures. Vascular: Negative for hyperdense vessel Skull: Negative Sinuses/Orbits: Mucosal edema throughout the paranasal sinuses. Bilateral ocular surgery Other: None IMPRESSION: No acute intracranial abnormality. Atrophy with chronic microvascular ischemia. Electronically Signed   By: Franchot Gallo M.D.   On: 11/05/2017 16:49   Ct Abdomen Pelvis W Contrast  Result Date: 11/05/2017 CLINICAL DATA:  COPD, pneumonia, abdominal pain EXAM: CT ABDOMEN AND PELVIS WITH CONTRAST TECHNIQUE: Multidetector CT imaging of the abdomen and pelvis was performed using the standard protocol following bolus administration of intravenous contrast. CONTRAST:  32mL ISOVUE-300 IOPAMIDOL (ISOVUE-300) INJECTION 61%, 171mL OMNIPAQUE IOHEXOL 300 MG/ML SOLN COMPARISON:  None. FINDINGS: Lower chest: Dense consolidative airspace process in the left lower lobe compatible with pneumonia. Minor additional patchy airspace disease or atelectasis in the lingula and right lower lobe dependently. No pericardial or pleural effusion. Normal heart size. NG tube at the very proximal stomach. Hepatobiliary: No focal liver abnormality is seen. No gallstones, gallbladder wall thickening, or biliary dilatation. Pancreas: Unremarkable. No pancreatic ductal dilatation or surrounding inflammatory  changes. Spleen: Normal in size.  Small splenic granulomata noted Adrenals/Urinary Tract: Normal adrenal glands. Symmetric renal enhancement and excretion. No hydronephrosis obstruction. Foley catheter within the bladder which is collapsed. No ureteral calculus demonstrated. Stomach/Bowel: Negative for bowel obstruction, significant dilatation, ileus, or free air. Appendix not visualized. Sigmoid and rectum demonstrate mild diffuse wall thickening with surrounding pericolonic strandy edema/inflammation compatible with distal colitis/proctitis. Rectal Foley in place. No fluid collection or abscess. No ascites. No free air. Vascular/Lymphatic: Aortic atherosclerosis and tortuosity. Negative for aneurysm or occlusive process. Mesenteric and renal vasculature appear patent. No adenopathy. Reproductive: No significant finding by CT Other: Small fat containing right inguinal hernia. Small fat containing umbilical hernia also noted. No other abdominal wall hernia. Musculoskeletal: Degenerative changes noted spine with associated scoliosis. No compression fracture. Multilevel vacuum disc phenomenon noted. IMPRESSION: Mild acute sigmoid and rectum wall thickening with surrounding strandy edema/inflammation compatible with distal colitis/proctitis. No associated obstruction pattern, free air, or abscess. Consolidative left lower lobe pneumonia Abdominal atherosclerosis without aneurysm Small fat containing umbilical and right inguinal hernias Electronically Signed   By: Jerilynn Mages.  Shick M.D.  On: 11/05/2017 16:56     STUDIES:  4/17  Bedside ECHO >> normal LV/RV size and function.  4/17  CT Head >> no acute abnormality  4/17  CT ABD/Pelvis >> mild acute sigmoid & rectum wall thickening with surrounding strandy edema / inflammation compatible with distal colitis / proctitis.  No obstructive pattern, free air or abscess. Consolidative LLL PNA.  Small fat containing umbilical & R inguinal hernia  CULTURES: BCx2 4/17 >>  UC  4/17 >>  GI Virus PCR 4/18 >>   ANTIBIOTICS: Azithro 4/17 >>  Ceftriaxone 4/17 >>   SIGNIFICANT EVENTS: 4/17  Tx to [facility] from [facility] with AMS, possible syncope vs hypoxia with fall   LINES/TUBES: ETT 4/17 >>   DISCUSSION: 83 y/o seemingly well elderly male admitted 4/17 after fall at home - concern for syncopal episode vs PNA with hypoxia.  In addition, he had N/V, diarrhea prior to presentation.  CT head negative.  CT abd/pelvis showed dense LLL PNA.  EKG / troponin negative.  Doubt cardiac cause of syncope.  Suspect PNA driving presentation.  Hx of significant GERD with emesis into CPAP at night.   ASSESSMENT / PLAN:  PULMONARY A: Acute Hypoxic Respiratory Failure - due to CAP requiring mechanical ventilation. COPD  OSA on CPAP  P:    PRVC 8cc/kg  Wean PEEP / FiO2 for sats > 90% PSV wean with WUA for possible extubation 4/18 Follow intermittent CXR Hold home Advair, Spiriva  Duoneb Q6 with Q3 PRN albuterol   CARDIOVASCULAR A:  Septic Shock - in setting of LLL PNA, resolved. Syncope - cardiac source ruled out with negative EKG / troponin  P:  Monitor in ICU  EKG / troponin negative  Repeat EKG now with concern for limb reversal  Hold home HCTZ, cozaar  RENAL A:   AKI - in setting of hypovolemic hyponatremia.  P:   NS @ 48ml/hr  Trend BMP / urinary output Replace electrolytes as indicated Avoid nephrotoxic agents, ensure adequate renal perfusion  GASTROINTESTINAL A:   Vomiting  Hx Severe GERD - has nightly emesis into CPAP mask, uses multiple agents for reflux Proctitis  P:   NPO / OGT  CT ABD as above Continue pepcid BID Convert to home PPI once extubated, taking PO's    HEMATOLOGIC A:   High DVT risk. P:  Lovenox for DVT prophylaxis Trend CBC   INFECTIOUS A:   Leukocytosis - due to pneumonia with septic shock Proctitis - as seen on CT Abd P:   ABX as above  Follow cultures   ENDOCRINE A:   Hx Hypothyroidism P:   Resume synthroid once  taking PO's  Follow glucose on BMP  NEUROLOGIC A:   ICU Associated Pain  Depression  Chronic Back Pain / Arthritis  P:   RASS goal: 0 to -1  Hold fentanyl for SBT  Hold home celexa, gabapentin, norco, melatonin until can take PO's   FAMILY  - Updates:  No family at bedside am 4/18.  Will update via phone.   CC Time: 30 minutes   Noe Gens, NP-C [facility] Pgr: [phone] or if no answer [phone] 11/06/2017, 9:18 AM

## 2017-11-06 NOTE — Progress Notes (Signed)
Unable to do CPT at this time. Patient in chair.

## 2017-11-06 NOTE — Progress Notes (Signed)
Initial Nutrition Assessment  DOCUMENTATION CODES:   Obesity unspecified  INTERVENTION:  - If pt unable to be extubated today, recommend Vital High Protein @ 55 mL/hr with 30 mL Prostat BID. This regimen will provide 1520 kcal, 145 grams of protein, and 1003 mL free water.   NUTRITION DIAGNOSIS:   Inadequate oral intake related to inability to eat as evidenced by NPO status.  GOAL:   Provide needs based on ASPEN/SCCM guidelines  MONITOR:   Vent status, Weight trends, Labs, I & O's  REASON FOR ASSESSMENT:   Ventilator  ASSESSMENT:   [name] with PMH of COPD, OSA on CPAP admitted 4/17 after being found down in the bathroom by his wife confused and short of breath. There were concerns for vomiting & diarrhea.  He was intubated for airway protection.  Concern for CAP with hypoxia vs syncope episode.   Pt is intubated with OGT in place. Per review of abdominal x-ray results report from yesterday "enteric tube is noted ending overlying the body of the stomach." OGT currently clamped.  Per Brandi's note this AM: possible extubation today, septic shock in the setting of LLL PNA is now resolved, syncope r/o with negative EKG and troponin, AKI with hypovolemic hyponatremia, hx of severe GERD, proctitis.  Patient is currently intubated on ventilator support MV: 15.4 L/min Temp (24hrs), Avg:100.1 F (37.8 C), Min:98.7 F (37.1 C), Max:101 F (38.3 C) Propofol: none BP: 145/73 and MAP: 99  Medications reviewed; 20 mg IV Pepcid BID.  Labs reviewed; Na: 130 mmol/L, Ca: 7.9 mg/dL, GFR: 57 mL/min.  IVF: NS @ 75 mL/hr.       NUTRITION - FOCUSED PHYSICAL EXAM:  Completed/assessed; no muscle and no fat wasting, mild edema throughout body.   Diet Order:  Diet NPO time specified  EDUCATION NEEDS:   No education needs have been identified at this time  Skin:  Skin Assessment: Reviewed RN Assessment  Last BM:  4/18  Height:   Ht Readings from Last 1 Encounters:  11/05/17  5\' 8"  (1.727 m)    Weight:   Wt Readings from Last 1 Encounters:  11/06/17 250 lb 14.1 oz (113.8 kg)    Ideal Body Weight:  70 kg  BMI:  Body mass index is 38.15 kg/m.  Estimated Nutritional Needs:   Kcal:  1252-1593 (11-14 kcal/kg)  Protein:  >/= 140 grams (2 grams/kg IBW)  Fluid:  >/= 1.8 L/day      Cristian Matin, MS, RD, LDN, [facility] Inpatient Clinical Dietitian Pager # [phone] After hours/weekend pager # [phone]

## 2017-11-06 NOTE — Procedures (Signed)
Extubation Procedure Note  Patient Details:   Name: Cristian Yang DOB: 1935/06/03 MRN: [id number]   Airway Documentation:     Evaluation  O2 sats: stable throughout Complications: No apparent complications Patient did tolerate procedure well. Bilateral Breath Sounds: Rhonchi, Diminished   Yes   Patient extubated to [facility]; currently on 3 L O2. RT will continue to monitor patient.   Lamonte Sakai 11/06/2017, 11:50 AM

## 2017-11-06 NOTE — Progress Notes (Signed)
Pt refused CPAP qhs.  Pt states he is hurting too much to tolerate CPAP tonight.  Talked with Pt about CPAP use and he said he would have his wife bring in his home mask and tubing for tomorrow night.  Pt stated his home setting is 4 cm H2O.  Pt encouraged to contact RT should he change his mind.

## 2017-11-07 ENCOUNTER — Inpatient Hospital Stay (HOSPITAL_COMMUNITY): Payer: Medicare Other

## 2017-11-07 ENCOUNTER — Other Ambulatory Visit: Payer: Self-pay | Admitting: Pulmonary Disease

## 2017-11-07 DIAGNOSIS — R6521 Severe sepsis with septic shock: Secondary | ICD-10-CM

## 2017-11-07 DIAGNOSIS — J189 Pneumonia, unspecified organism: Secondary | ICD-10-CM

## 2017-11-07 DIAGNOSIS — K219 Gastro-esophageal reflux disease without esophagitis: Secondary | ICD-10-CM

## 2017-11-07 LAB — GLUCOSE, CAPILLARY
GLUCOSE-CAPILLARY: 85 mg/dL (ref 65–99)
GLUCOSE-CAPILLARY: 119 mg/dL — AB (ref 65–99)
Glucose-Capillary: 90 mg/dL (ref 65–99)
Glucose-Capillary: 120 mg/dL — ABNORMAL HIGH (ref 65–99)
Glucose-Capillary: 90 mg/dL (ref 65–99)
Glucose-Capillary: 110 mg/dL — ABNORMAL HIGH (ref 65–99)

## 2017-11-07 LAB — CBC WITH DIFFERENTIAL/PLATELET
Basophils Absolute: 0 10*3/uL (ref 0.0–0.1)
Basophils Relative: 0 %
EOS ABS: 0 10*3/uL (ref 0.0–0.7)
EOS PCT: 0 %
HCT: 33 % — ABNORMAL LOW (ref 39.0–52.0)
Hemoglobin: 11.3 g/dL — ABNORMAL LOW (ref 13.0–17.0)
Lymphocytes Relative: 4 %
Lymphs Abs: 0.8 10*3/uL (ref 0.7–4.0)
MCH: 32.2 pg (ref 26.0–34.0)
MCHC: 34.2 g/dL (ref 30.0–36.0)
MCV: 94 fL (ref 78.0–100.0)
MONO ABS: 1.6 10*3/uL — AB (ref 0.1–1.0)
MONOS PCT: 8 %
Neutro Abs: 18.5 10*3/uL — ABNORMAL HIGH (ref 1.7–7.7)
Neutrophils Relative %: 88 %
PLATELETS: 216 10*3/uL (ref 150–400)
RBC: 3.51 MIL/uL — AB (ref 4.22–5.81)
RDW: 13.5 % (ref 11.5–15.5)
WBC: 20.9 10*3/uL — AB (ref 4.0–10.5)

## 2017-11-07 LAB — COMPREHENSIVE METABOLIC PANEL
ALT: 22 U/L (ref 17–63)
ANION GAP: 9 (ref 5–15)
AST: 40 U/L (ref 15–41)
Albumin: 3 g/dL — ABNORMAL LOW (ref 3.5–5.0)
Alkaline Phosphatase: 49 U/L (ref 38–126)
BUN: 24 mg/dL — ABNORMAL HIGH (ref 6–20)
CO2: 22 mmol/L (ref 22–32)
CREATININE: 0.89 mg/dL (ref 0.61–1.24)
Calcium: 8.2 mg/dL — ABNORMAL LOW (ref 8.9–10.3)
Chloride: 104 mmol/L (ref 101–111)
Glucose, Bld: 98 mg/dL (ref 65–99)
POTASSIUM: 3.9 mmol/L (ref 3.5–5.1)
SODIUM: 135 mmol/L (ref 135–145)
Total Bilirubin: 0.7 mg/dL (ref 0.3–1.2)
Total Protein: 5.9 g/dL — ABNORMAL LOW (ref 6.5–8.1)

## 2017-11-07 LAB — STREP PNEUMONIAE URINARY ANTIGEN: Strep Pneumo Urinary Antigen: NEGATIVE

## 2017-11-07 LAB — PROCALCITONIN: Procalcitonin: 13.85 ng/mL

## 2017-11-07 LAB — GASTROINTESTINAL PANEL BY PCR, STOOL (REPLACES STOOL CULTURE)

## 2017-11-07 MED ORDER — GABAPENTIN 300 MG PO CAPS
300.0000 mg | ORAL_CAPSULE | Freq: Every morning | ORAL | Status: DC
  Administered 2017-11-07 – 2017-11-13 (×7): 300 mg via ORAL
  Filled 2017-11-07 (×7): qty 1

## 2017-11-07 MED ORDER — ADULT MULTIVITAMIN W/MINERALS CH
1.0000 | ORAL_TABLET | Freq: Every day | ORAL | Status: DC
  Administered 2017-11-07 – 2017-11-13 (×7): 1 via ORAL
  Filled 2017-11-07 (×7): qty 1

## 2017-11-07 MED ORDER — CITALOPRAM HYDROBROMIDE 20 MG PO TABS
20.0000 mg | ORAL_TABLET | Freq: Every day | ORAL | Status: DC
  Administered 2017-11-07 – 2017-11-13 (×7): 20 mg via ORAL
  Filled 2017-11-07 (×7): qty 1

## 2017-11-07 MED ORDER — GABAPENTIN 300 MG PO CAPS
600.0000 mg | ORAL_CAPSULE | Freq: Every day | ORAL | Status: DC
  Administered 2017-11-07 – 2017-11-12 (×6): 600 mg via ORAL
  Filled 2017-11-07 (×7): qty 2

## 2017-11-07 MED ORDER — LEVOTHYROXINE SODIUM 50 MCG PO TABS
75.0000 ug | ORAL_TABLET | Freq: Every day | ORAL | Status: DC
  Administered 2017-11-07 – 2017-11-13 (×7): 75 ug via ORAL
  Filled 2017-11-07 (×7): qty 1

## 2017-11-07 MED ORDER — TIOTROPIUM BROMIDE MONOHYDRATE 2.5 MCG/ACT IN AERS: 2.0000 | INHALATION_SPRAY | Freq: Every day | RESPIRATORY_TRACT | Status: DC

## 2017-11-07 MED ORDER — PANTOPRAZOLE SODIUM 40 MG PO TBEC
40.0000 mg | DELAYED_RELEASE_TABLET | Freq: Every day | ORAL | Status: DC
  Administered 2017-11-07 – 2017-11-13 (×7): 40 mg via ORAL
  Filled 2017-11-07 (×7): qty 1

## 2017-11-07 MED ORDER — MONTELUKAST SODIUM 10 MG PO TABS
10.0000 mg | ORAL_TABLET | Freq: Every day | ORAL | Status: DC
  Administered 2017-11-07 – 2017-11-12 (×6): 10 mg via ORAL
  Filled 2017-11-07 (×7): qty 1

## 2017-11-07 MED ORDER — ACETAMINOPHEN 325 MG PO TABS
650.0000 mg | ORAL_TABLET | Freq: Four times a day (QID) | ORAL | Status: DC | PRN
  Administered 2017-11-08: 650 mg via ORAL
  Filled 2017-11-07: qty 2

## 2017-11-07 MED ORDER — ASPIRIN EC 81 MG PO TBEC
81.0000 mg | DELAYED_RELEASE_TABLET | Freq: Every day | ORAL | Status: DC
  Administered 2017-11-07 – 2017-11-13 (×7): 81 mg via ORAL
  Filled 2017-11-07 (×7): qty 1

## 2017-11-07 MED ORDER — LOSARTAN POTASSIUM 50 MG PO TABS
100.0000 mg | ORAL_TABLET | Freq: Every day | ORAL | Status: DC
  Administered 2017-11-07 – 2017-11-13 (×7): 100 mg via ORAL
  Filled 2017-11-07 (×7): qty 2

## 2017-11-07 MED ORDER — MELATONIN 3 MG PO TABS
3.0000 mg | ORAL_TABLET | Freq: Every day | ORAL | Status: DC
  Administered 2017-11-07 – 2017-11-12 (×6): 3 mg via ORAL
  Filled 2017-11-07 (×6): qty 1

## 2017-11-07 MED ORDER — FLUTICASONE PROPIONATE 50 MCG/ACT NA SUSP
2.0000 | Freq: Every day | NASAL | Status: DC
  Administered 2017-11-07 – 2017-11-13 (×6): 2 via NASAL
  Filled 2017-11-07 (×2): qty 16

## 2017-11-07 MED ORDER — IPRATROPIUM-ALBUTEROL 0.5-2.5 (3) MG/3ML IN SOLN
3.0000 mL | Freq: Three times a day (TID) | RESPIRATORY_TRACT | Status: DC
  Administered 2017-11-07 – 2017-11-13 (×18): 3 mL via RESPIRATORY_TRACT
  Filled 2017-11-07 (×18): qty 3

## 2017-11-07 MED ORDER — AZITHROMYCIN 250 MG PO TABS
500.0000 mg | ORAL_TABLET | Freq: Every day | ORAL | Status: DC
  Administered 2017-11-08 – 2017-11-12 (×5): 500 mg via ORAL
  Filled 2017-11-07 (×5): qty 2

## 2017-11-07 MED ORDER — HYDROCODONE-ACETAMINOPHEN 5-325 MG PO TABS
1.0000 | ORAL_TABLET | Freq: Four times a day (QID) | ORAL | Status: DC | PRN
  Administered 2017-11-07 – 2017-11-12 (×12): 1 via ORAL
  Filled 2017-11-07 (×12): qty 1

## 2017-11-07 MED ORDER — MOMETASONE FURO-FORMOTEROL FUM 200-5 MCG/ACT IN AERO
2.0000 | INHALATION_SPRAY | Freq: Two times a day (BID) | RESPIRATORY_TRACT | Status: DC
  Administered 2017-11-07 – 2017-11-13 (×11): 2 via RESPIRATORY_TRACT
  Filled 2017-11-07: qty 8.8

## 2017-11-07 NOTE — Progress Notes (Signed)
Pt refused CPT this round stating that it hurt.

## 2017-11-07 NOTE — Progress Notes (Signed)
Fentanyl bag used for continuous therapy wasted with Eber Hong, RN. Measured 100 cc of waste. Wasted in the sink.

## 2017-11-07 NOTE — Progress Notes (Signed)
PROGRESS NOTE  SURAJ RAMDASS DXA:[id number] DOB: 1935-04-28 DOA: 11/05/2017 PCP: Rogue Bussing, MD  HPI/Recap of past 24 hours: 83 y/o male with PMH of COPD, OSA on CPAP, GERD, HTN, hypothyroidism, admitted 4/17 after being found down in the bathroom by his wife, confused with SOB, N/V, diarrhea noted PTA. Wife also noted some vomiting of which happens occasionally while pt is wearing his CPAP. Pt was admitted by the ICU team and intubated on 11/05/17 for airway protection and extubated on 11/06/17. Pt was managed for acute respiratory failure due to CAP and w/u for ?syncope. [facility] was asked to take over care on 11/07/17.  Today, met pt restless in bed, wanting to get out of bed, alert. Pt denies any chest pain, worsening SOB, abdominal pain, N/V, fever/chills. Noted to still have a rectal tube with brown liquid stool in bag.   Assessment/Plan: Active Problems:   Acute respiratory failure (HCC)  Acute hypoxic respiratory failure 2/2 CAP Afebrile, with leukocytosis Currently sats well on [place] O2, continue O2 S/P mechanical ventilation, intubated on 4/17, extubated on 4/18 Will order strep pneumo and Legionella urine-pending Will order pro-calcitonin and trend BC x 2, NGTD CXR with LUL & LLL PNA Continue IV Rocephin, azithromycin Monitor closely  Septic shock likely due to CAP Resolved, alert Initially required Levophed, currently weaned off BP has remained stable/high Workup as above  Syncope Likely due to above, sepsis/hypoxia CT head negative Troponin x3-, EKG no acute ST changes  Acute distal colitis/proctitis Ongoing diarrhea, although noted to be resolving CT abdomen showed mild acute sigmoid and rectum wall thickening with surrounding strandy edema/inflammation compatible with distal colitis/proctitis Stool GI panel: pending Continue IV antibiotics  GERD Worse, hx of vomiting in the CPAP Unsure if compliant to PPI at home due to cost Will continue PPI  COPD    Continue [place] O2 Continue duonebs, inhalers, singulair Further management as above  OSA Continue CPAP  HTN Continue home losartan, will add on if uncontrolled  Hypothyroidism Continue synthroid  Chronic back pain Continue Gabapentin, norco  Depression/insomnia  Continue Celexa, melatonin    Code Status: Full  Family Communication: None at bedside  Disposition Plan: Once no longer requiring oxygen   Consultants:  PCCM  Procedures:  Mechanical ventilation on 11/05/17  Antimicrobials:  IV Rocephin  IV azithromycin  DVT prophylaxis: Lovenox   Objective: Vitals:   11/07/17 1100 11/07/17 1200 11/07/17 1252 11/07/17 1300  BP:   (!) 187/68   Pulse: 72 72 75 72  Resp: 18 (!) 21 16 (!) 25  Temp:  (!) 97.5 F (36.4 C)    TempSrc:  Oral    SpO2: 96% 96% 95% 96%  Weight:      Height:        Intake/Output Summary (Last 24 hours) at 11/07/2017 1320 Last data filed at 11/07/2017 0200 Gross per 24 hour  Intake -  Output 650 ml  Net -650 ml   Filed Weights   11/05/17 0443 11/06/17 0400  Weight: 107 kg (236 lb) 113.8 kg (250 lb 14.1 oz)    Exam:   General: Restless, NAD  Cardiovascular: S1, S2 present  Respiratory: Diminished breath sounds bilaterally  Abdomen: Soft, distended, nontender, bowel sounds present, rectal tube draining brown liquid stool  Musculoskeletal: No pedal edema bilaterally  Skin: Normal  Psychiatry: Normal mood   Data Reviewed: CBC: Recent Labs  Lab 11/05/17 0504 11/06/17 0259 11/07/17 0330  WBC 16.9* 31.7* 20.9*  NEUTROABS 14.5* 28.5* 18.5*  HGB  14.9 12.5* 11.3*  HCT 43.9 36.6* 33.0*  MCV 94.0 93.6 94.0  PLT 289 262 540   Basic Metabolic Panel: Recent Labs  Lab 11/05/17 0504 11/06/17 0259 11/07/17 0330  NA 126* 130* 135  K 3.8 4.5 3.9  CL 88* 102 104  CO2 25 20* 22  GLUCOSE 129* 98 98  BUN 14 20 24*  CREATININE 1.27* 1.17 0.89  CALCIUM 9.4 7.9* 8.2*   GFR: Estimated Creatinine Clearance: 79.7  mL/min (by C-G formula based on SCr of 0.89 mg/dL). Liver Function Tests: Recent Labs  Lab 11/05/17 0504 11/06/17 0259 11/07/17 0330  AST 28 30 40  ALT 22 20 22   ALKPHOS 66 41 49  BILITOT 0.7 0.6 0.7  PROT 7.1 5.7* 5.9*  ALBUMIN 4.0 2.9* 3.0*   Recent Labs  Lab 11/05/17 0504  LIPASE 32   No results for input(s): AMMONIA in the last 168 hours. Coagulation Profile: No results for input(s): INR, PROTIME in the last 168 hours. Cardiac Enzymes: Recent Labs  Lab 11/05/17 0504 11/05/17 1130 11/05/17 2034 11/06/17 0259  TROPONINI <0.03 <0.03 <0.03 <0.03   BNP (last 3 results) No results for input(s): PROBNP in the last 8760 hours. HbA1C: No results for input(s): HGBA1C in the last 72 hours. CBG: Recent Labs  Lab 11/06/17 1926 11/06/17 2318 11/07/17 0319 11/07/17 0810 11/07/17 1210  GLUCAP 103* 98 90 85 120*   Lipid Profile: No results for input(s): CHOL, HDL, LDLCALC, TRIG, CHOLHDL, LDLDIRECT in the last 72 hours. Thyroid Function Tests: No results for input(s): TSH, T4TOTAL, FREET4, T3FREE, THYROIDAB in the last 72 hours. Anemia Panel: No results for input(s): VITAMINB12, FOLATE, FERRITIN, TIBC, IRON, RETICCTPCT in the last 72 hours. Urine analysis:    Component Value Date/Time   COLORURINE YELLOW 11/05/2017 [place] 11/05/2017 0538   LABSPEC 1.006 11/05/2017 0538   PHURINE 7.0 11/05/2017 0538   GLUCOSEU NEGATIVE 11/05/2017 0538   HGBUR NEGATIVE 11/05/2017 0538   BILIRUBINUR NEGATIVE 11/05/2017 0538   KETONESUR NEGATIVE 11/05/2017 0538   PROTEINUR NEGATIVE 11/05/2017 0538   NITRITE NEGATIVE 11/05/2017 0538   LEUKOCYTESUR NEGATIVE 11/05/2017 0538   Sepsis Labs: @LABRCNTIP (procalcitonin:4,lacticidven:4)  ) Recent Results (from the past 240 hour(s))  Urine culture     Status: None   Collection Time: 11/05/17  5:38 AM  Result Value Ref Range Status   Specimen Description   Final    URINE, RANDOM Performed at [facility], [address], [place], [place] [address]    Special Requests   Final    NONE Performed at [facility], [address], [place], [place] [address]    Culture   Final    NO GROWTH Performed at [facility], [place] [address], [place], [place] [address]    Report Status 11/06/2017 FINAL  Final  Blood Culture (routine x 2)     Status: None (Preliminary result)   Collection Time: 11/05/17  5:49 AM  Result Value Ref Range Status   Specimen Description BLOOD LEFT HAND  Final   Special Requests   Final    BOTTLES DRAWN AEROBIC ONLY Blood Culture results may not be optimal due to an inadequate volume of blood received in culture bottles   Culture   Final    NO GROWTH 2 DAYS Performed at [facility], [address], [place], [place] [address]    Report Status PENDING  Incomplete  Blood Culture (routine x 2)     Status: None (Preliminary result)  Collection Time: 11/05/17  6:31 AM  Result Value Ref Range Status   Specimen Description BLOOD RIGHT HAND  Final   Special Requests   Final    ANAEROBIC BOTTLE ONLY Blood Culture results may not be optimal due to an inadequate volume of blood received in culture bottles   Culture   Final    NO GROWTH 2 DAYS Performed at [facility], [address], [place], [place] [address]    Report Status PENDING  Incomplete  MRSA PCR Screening     Status: None   Collection Time: 11/05/17 12:44 PM  Result Value Ref Range Status   MRSA by PCR NEGATIVE NEGATIVE Final    Comment:        The GeneXpert MRSA Assay (FDA approved for NASAL specimens only), is one component of a comprehensive MRSA colonization surveillance program. It is not intended to diagnose MRSA infection nor to guide or monitor treatment for MRSA infections. Performed at [facility], [place] [address], [place], [place] [address]       Studies: Dg Chest Port 1 View  Result Date: 11/07/2017 CLINICAL DATA:  Respiratory failure EXAM: PORTABLE CHEST 1 VIEW COMPARISON:  November 05, 2017 FINDINGS: Endotracheal tube has been removed. No pneumothorax. There remains patchy airspace consolidation throughout much of the left lung, with the greatest degree of consolidation in the left upper lobe. Areas of mild scattered atelectatic change on the right noted without consolidation. Heart is mildly enlarged with pulmonary vascularity within normal limits. No adenopathy. There is aortic atherosclerosis. There is postoperative change in the lower cervical region. IMPRESSION: Airspace consolidation throughout much of the left lung with the greatest degree of consolidation left upper lobe. There are patchy areas of atelectasis on the right. Stable cardiomegaly. There is aortic atherosclerosis. No evident adenopathy. No pneumothorax. Aortic Atherosclerosis (ICD10-I70.0). Electronically Signed   By: Lowella Grip III M.D.   On: 11/07/2017 07:44    Scheduled Meds: . aspirin EC  81 mg Oral Daily  . citalopram  20 mg Oral Daily  . enoxaparin (LOVENOX) injection  40 mg Subcutaneous Q24H  . fluticasone  2 spray Each Nare Daily  . gabapentin  300 mg Oral q morning - 10a  . gabapentin  600 mg Oral QHS  . ipratropium-albuterol  3 mL Nebulization TID  . levothyroxine  75 mcg Oral QAC breakfast  . losartan  100 mg Oral Daily  . Melatonin  3 mg Oral QHS  . mometasone-formoterol  2 puff Inhalation BID  . montelukast  10 mg Oral QHS  . multivitamin with minerals  1 tablet Oral Daily  . pantoprazole  40 mg Oral Daily    Continuous Infusions: . azithromycin 500 mg (11/07/17 0559)  . cefTRIAXone (ROCEPHIN)  IV 2 g (11/07/17 0559)  . fentaNYL infusion INTRAVENOUS 50 mcg/hr (11/06/17 0700)     LOS: 2 days     Alma Friendly, MD [facility]   If 7PM-7AM, please contact night-coverage [email or web address] Password [email or web address] 11/07/2017, 1:20 PM

## 2017-11-07 NOTE — Progress Notes (Signed)
Nutrition Follow-up  DOCUMENTATION CODES:   Obesity unspecified  INTERVENTION:   - Continue MVI with minerals daily  - Encourage PO intake  - Pt denied any oral nutrition supplements  NUTRITION DIAGNOSIS:   Inadequate oral intake related to inability to eat as evidenced by NPO status.  Progressing as pt is no longer NPO  GOAL:   Patient will meet greater than or equal to 90% of their needs  Improving  MONITOR:   PO intake, Labs, Weight trends  ASSESSMENT:   [name] with PMH of COPD, OSA on CPAP admitted 4/17 after being found down in the bathroom by his wife confused and short of breath. There were concerns for vomiting & diarrhea.  He was intubated for airway protection.  Concern for CAP with hypoxia vs syncope episode.   11/06/17 - pt extubated 11/07/17 - pt seen by SLP who recommended mechanical soft with chopped meats, thin liquids  Spoke with pt at bedside who states he has a good appetite. Pt is awaiting his lunch tray. Pt states he ate crackers and applesauce this morning during his bedside swallow evaluation. Pt declined any oral nutrition supplements at this time.  Medications reviewed and include: 75 mcg levothyroxine daily, MVI with minerals daily, 40 mg Protonix daily, IV antibiotics  Labs reviewed: BUN 24 (H), hemoglobin 11.3 (L), HCT 33.0 (L) CBG's: 85, 90, 98, 103, 83 x 24 hours  Diet Order:  DIET SOFT Room service appropriate? Yes; Fluid consistency: Thin  EDUCATION NEEDS:   No education needs have been identified at this time  Skin:  Skin Assessment: Reviewed RN Assessment  Last BM:  11/06/17  Height:   Ht Readings from Last 1 Encounters:  11/05/17 5\' 8"  (1.727 m)    Weight:   Wt Readings from Last 1 Encounters:  11/06/17 250 lb 14.1 oz (113.8 kg)    Ideal Body Weight:  70 kg  BMI:  Body mass index is 38.15 kg/m.  Estimated Nutritional Needs:   Kcal:  1800-2000 kcal/day (MSJ x 1.0-1.1)  Protein:  100-115 grams/day  Fluid:   >/= 1.8 L/day    Gaynell Face, MS, RD, LDN Pager: [phone] Weekend/After Hours: [phone]

## 2017-11-07 NOTE — Progress Notes (Signed)
PHARMACIST - PHYSICIAN COMMUNICATION DR:   Clide [place] CONCERNING: Antibiotic IV to Oral Route Change Policy  RECOMMENDATION: This patient is receiving Azithromycin by the intravenous route.  Based on criteria approved by the Pharmacy and Therapeutics Committee, the antibiotic(s) is/are being converted to the equivalent oral dose form(s).  DESCRIPTION: These criteria include:  Patient being treated for a respiratory tract infection, urinary tract infection, cellulitis or clostridium difficile associated diarrhea if on metronidazole  The patient is not neutropenic and does not exhibit a GI malabsorption state  The patient is eating (either orally or via tube) and/or has been taking other orally administered medications for a least 24 hours  The patient is improving clinically and has a Tmax < 100.5  If you have questions about this conversion, please contact the Pharmacy Department  [phone] )  [facility] PharmD, [place] Pager [phone] 11/07/2017 2:13 PM

## 2017-11-07 NOTE — Progress Notes (Signed)
Patients wife called this RN about phone not working in patient's room. This RN went in room to make sure the phone was working and called the phone to ring in the room. This RN confirmed the phone number with wife. Wife would like to speak with physician in morning.

## 2017-11-07 NOTE — Progress Notes (Signed)
Fentanyl bag wasted with [place] Scott, 1100ml left. Was not charted that it was stopped upon arrival to my shift.

## 2017-11-07 NOTE — Progress Notes (Signed)
11/07/17 @ 2230 - Retha Mcclenathan (pt's wife) [phone]) called to discuss some major concerns that she is having with her husband's care.  Mrs Dimaria's major concern is that a physician did not talk with her yesterday regarding her husband's care.  She stated that the nurse stated that she had paged the MD, but MD never called back and never came back when she was there to talk with her.  After she had talked with me for approx 20-30 minutes by phone, I explained to her at 2230 at night that I could place a note in the chart for the physician to contact her in the morning, Mrs Ahlers is now expecting a call from the physician in the morning @ [phone].  Please call!!!  Another concern that she had was that the phone in the room was not functioning.  She had discussed this earlier with the primary RN (see that progress note).  I also called the number in the room and phone did ring and is functional.  Patient is known to have intermittent confusion this admission, currently is asleep, primary RN advised me that patient needed his rest to provide the best course of treatment at this time.  [place] Coordinator / Rapid Response Nurse

## 2017-11-07 NOTE — Evaluation (Signed)
Clinical/Bedside Swallow Evaluation Patient Details  Name: Cristian Yang MRN: [id number] Date of Birth: [birth year]  Today's Date: 11/07/2017 Time: SLP Start Time (ACUTE ONLY): 44 SLP Stop Time (ACUTE ONLY): 1121 SLP Time Calculation (min) (ACUTE ONLY): 19 min  Past Medical History:  Past Medical History:  Diagnosis Date  . Arthritis   . Chronic back pain   . COPD (chronic obstructive pulmonary disease) ([place])   . Depression   . GERD (gastroesophageal reflux disease)   . Hyperlipidemia   . Hypertension   . Hypothyroidism   . Skin cancer   . Sleep apnea    uses CPAP nightly   Past Surgical History:  Past Surgical History:  Procedure Laterality Date  . CATARACT EXTRACTION W/PHACO Left 02/13/2016   Procedure: CATARACT EXTRACTION PHACO AND INTRAOCULAR LENS PLACEMENT (IOC);  Surgeon: Rutherford Guys, MD;  Location: [facility];  Service: Ophthalmology;  Laterality: Left;  CDE: 9.23  . NECK SURGERY     cervical disc  . PROSTATE SURGERY     TURP, laser  . SKIN CANCER EXCISION  2013  . skin cancer removed     x3   HPI:  [name] with PMH of COPD, OSA on CPAP admitted 4/17 after being found down in the bathroom by his wife confused and short of breath. There were concerns for vomiting &diarrhea. He was intubated for airway protection. Concern for CAP with hypoxia vs syncope episode. Wife reports such bad reflux that he regularly will have emesis into his CPAP machine.  Intubated 4/17-18. CT head negative.    Assessment / Plan / Recommendation Clinical Impression  Pt presents with a functional oropharyngeal swallow but probable primary esophageal dysphagia s/p brief intubation with adequate phonation, active mastication, brisk swallow response, no overt s/s of aspiration.  He belches consistently after each swallow; his wife reiterates that this happens constantly, as well as frequent reflux.  He takes a PPI.  Pt may benefit from further GI evaluation/pharmacological management of GERD.  No  further acute SLP needs identified - recommend soft mechanical diet with chopped meats/extra gravy per pt/wife request.  Give meds whole in puree.  SLP services will sign off.    SLP Visit Diagnosis: Dysphagia, unspecified (R13.10)    Aspiration Risk  Other (comment)(risk for aspiration of reflux)    Diet Recommendation   mechanical soft with chopped meats; thin liquids  Medication Administration: Whole meds with puree    Other  Recommendations Recommended Consults: Consider esophageal assessment Oral Care Recommendations: Oral care BID   Follow up Recommendations None      Frequency and Duration            Prognosis        Swallow Study   General Date of Onset: 11/05/17 HPI: [name] with PMH of COPD, OSA on CPAP admitted 4/17 after being found down in the bathroom by his wife confused and short of breath. There were concerns for vomiting &diarrhea. He was intubated for airway protection. Concern for CAP with hypoxia vs syncope episode. Wife reports such bad reflux that he regularly will have emesis into his CPAP machine.  Intubated 4/17-18. CT head negative.  Type of Study: Bedside Swallow Evaluation Previous Swallow Assessment: no Diet Prior to this Study: NPO Temperature Spikes Noted: No Respiratory Status: Nasal cannula History of Recent Intubation: Yes Length of Intubations (days): 1 days Date extubated: 11/06/17 Behavior/Cognition: Alert;Confused Oral Cavity Assessment: Within Functional Limits Oral Care Completed by SLP: No Oral Cavity - Dentition:  Dentures, top;Dentures, bottom Vision: Functional for self-feeding Self-Feeding Abilities: Able to feed self Patient Positioning: Upright in bed Baseline Vocal Quality: Normal Volitional Cough: Strong Volitional Swallow: Able to elicit    Oral/Motor/Sensory Function Overall Oral Motor/Sensory Function: Within functional limits   Ice Chips Ice chips: Within functional limits   Thin Liquid Thin Liquid: Within  functional limits    Nectar Thick Nectar Thick Liquid: Not tested   Honey Thick Honey Thick Liquid: Not tested   Puree Puree: Within functional limits   Solid   GO   Solid: Within functional limits        Juan Quam Laurice 11/07/2017,11:31 AM  Estill Bamberg L. Tivis Ringer, [place] CCC/SLP Pager [phone]

## 2017-11-08 LAB — CBC WITH DIFFERENTIAL/PLATELET
BASOS PCT: 0 %
Basophils Absolute: 0 10*3/uL (ref 0.0–0.1)
Eosinophils Absolute: 0 10*3/uL (ref 0.0–0.7)
Eosinophils Relative: 0 %
HEMATOCRIT: 32.2 % — AB (ref 39.0–52.0)
HEMOGLOBIN: 11 g/dL — AB (ref 13.0–17.0)
Lymphocytes Relative: 5 %
Lymphs Abs: 0.8 10*3/uL (ref 0.7–4.0)
MCH: 32 pg (ref 26.0–34.0)
MCHC: 34.2 g/dL (ref 30.0–36.0)
MCV: 93.6 fL (ref 78.0–100.0)
MONOS PCT: 11 %
Monocytes Absolute: 1.8 10*3/uL — ABNORMAL HIGH (ref 0.1–1.0)
NEUTROS PCT: 84 %
Neutro Abs: 13.9 10*3/uL — ABNORMAL HIGH (ref 1.7–7.7)
Platelets: 199 10*3/uL (ref 150–400)
RBC: 3.44 MIL/uL — AB (ref 4.22–5.81)
RDW: 13.4 % (ref 11.5–15.5)
WBC: 16.6 10*3/uL — AB (ref 4.0–10.5)

## 2017-11-08 LAB — GLUCOSE, CAPILLARY
GLUCOSE-CAPILLARY: 95 mg/dL (ref 65–99)
Glucose-Capillary: 117 mg/dL — ABNORMAL HIGH (ref 65–99)
Glucose-Capillary: 104 mg/dL — ABNORMAL HIGH (ref 65–99)
Glucose-Capillary: 105 mg/dL — ABNORMAL HIGH (ref 65–99)
Glucose-Capillary: 131 mg/dL — ABNORMAL HIGH (ref 65–99)
Glucose-Capillary: 104 mg/dL — ABNORMAL HIGH (ref 65–99)

## 2017-11-08 LAB — BASIC METABOLIC PANEL
ANION GAP: 10 (ref 5–15)
BUN: 20 mg/dL (ref 6–20)
CHLORIDE: 105 mmol/L (ref 101–111)
CO2: 22 mmol/L (ref 22–32)
Calcium: 8.2 mg/dL — ABNORMAL LOW (ref 8.9–10.3)
Creatinine, Ser: 0.86 mg/dL (ref 0.61–1.24)
GFR calc non Af Amer: >60 mL/min (ref >60)
Glucose, Bld: 104 mg/dL — ABNORMAL HIGH (ref 65–99)
POTASSIUM: 3.8 mmol/L (ref 3.5–5.1)
SODIUM: 137 mmol/L (ref 135–145)

## 2017-11-08 LAB — PROCALCITONIN: PROCALCITONIN: 7.74 ng/mL

## 2017-11-08 MED ORDER — LABETALOL HCL 5 MG/ML IV SOLN
10.0000 mg | Freq: Once | INTRAVENOUS | Status: AC
  Administered 2017-11-08: 10 mg via INTRAVENOUS
  Filled 2017-11-08: qty 4

## 2017-11-08 MED ORDER — HYDRALAZINE HCL 20 MG/ML IJ SOLN
10.0000 mg | Freq: Four times a day (QID) | INTRAMUSCULAR | Status: DC | PRN
  Administered 2017-11-09 – 2017-11-11 (×6): 10 mg via INTRAVENOUS
  Filled 2017-11-08 (×6): qty 1

## 2017-11-08 MED ORDER — AMLODIPINE BESYLATE 5 MG PO TABS
5.0000 mg | ORAL_TABLET | Freq: Every day | ORAL | Status: DC
  Administered 2017-11-08: 5 mg via ORAL
  Filled 2017-11-08: qty 1

## 2017-11-08 NOTE — Progress Notes (Signed)
Patient no longer transferring due to respiratory status and elevated BP. RN will get report from [place], [place] and resume care on SD unit.

## 2017-11-08 NOTE — Progress Notes (Signed)
Pt transferred from bed to chair with moderate help. Pt became slightly sob and started to wheeze, prn nebulizer given.

## 2017-11-08 NOTE — Progress Notes (Signed)
Pt in chair at this time, CPT held at this time.  RT to monitor and assess as needed.

## 2017-11-08 NOTE — Progress Notes (Signed)
PROGRESS NOTE  AABAN GRIEP YYF:[id number] DOB: [birth year] DOA: 11/05/2017 PCP: Rogue Bussing, MD  HPI/Recap of past 24 hours: 83 y/o male with PMH of COPD, OSA on CPAP, GERD, HTN, hypothyroidism, admitted 4/17 after being found down in the bathroom by his wife, confused with SOB, N/V, diarrhea noted PTA. Wife also noted some vomiting of which happens occasionally while pt is wearing his CPAP. Pt was admitted by the ICU team and intubated on 11/05/17 for airway protection and extubated on 11/06/17. Pt was managed for acute respiratory failure due to CAP and w/u for ?syncope. TRH was asked to take over care on 11/07/17.  Today, met pt sitting on the chair. Pt denies any chest pain, worsening SOB, abdominal pain, N/V, fever/chills, diarrhea.   Assessment/Plan: Active Problems:   Acute respiratory failure (HCC)  Acute hypoxic respiratory failure 2/2 CAP Improving, still requiring O2 Afebrile, with resolving leukocytosis S/P mechanical ventilation, intubated on 4/17, extubated on 4/18 Strep pneumo negative, Legionella urine-pending Pro-calcitonin 13.85-->7.74 BC x 2, NGTD CXR with LUL & LLL PNA Continue IV Rocephin, azithromycin Monitor closely  Septic shock likely due to CAP Resolved, alert Initially required Levophed, currently weaned off BP has remained stable/high Workup as above  Syncope Likely due to above, sepsis/hypoxia CT head negative Troponin x3-, EKG no acute ST changes  Acute distal colitis/proctitis Resolved Ongoing diarrhea, although noted to be resolving CT abdomen showed mild acute sigmoid and rectum wall thickening with surrounding strandy edema/inflammation compatible with distal colitis/proctitis Stool GI panel: Negative Continue IV antibiotics  GERD Worse, hx of vomiting in the CPAP Unsure if compliant to PPI at home due to cost Continue PPI Spoke to GI, will see as an outpt  COPD  Continue [place] O2 Continue duonebs, inhalers,  singulair Further management as above  OSA Continue CPAP  HTN Continue home losartan, added norvasc 5 mg daily  Hypothyroidism Continue synthroid  Chronic back pain Continue Gabapentin, norco  Depression/insomnia  Continue Celexa, melatonin    Code Status: Full  Family Communication: None at bedside  Disposition Plan: Once no longer requiring oxygen   Consultants:  PCCM  Procedures:  Mechanical ventilation on 11/05/17  Antimicrobials:  IV Rocephin  IV azithromycin  DVT prophylaxis: Lovenox   Objective: Vitals:   11/08/17 0930 11/08/17 1000 11/08/17 1105 11/08/17 1219  BP:  (!) 163/68  (!) 165/55  Pulse: 64 64  77  Resp:    (!) 31  Temp:   98.2 F (36.8 C)   TempSrc:   Oral   SpO2: 95% 93%  92%  Weight:      Height:        Intake/Output Summary (Last 24 hours) at 11/08/2017 1235 Last data filed at 11/08/2017 1200 Gross per 24 hour  Intake 720 ml  Output 1025 ml  Net -305 ml   Filed Weights   11/05/17 0443 11/06/17 0400  Weight: 107 kg (236 lb) 113.8 kg (250 lb 14.1 oz)    Exam:   General: Restless, NAD  Cardiovascular: S1, S2 present  Respiratory: Diminished breath sounds bilaterally  Abdomen: Soft, distended, nontender, bowel sounds present  Musculoskeletal: No pedal edema bilaterally  Skin: Normal  Psychiatry: Normal mood   Data Reviewed: CBC: Recent Labs  Lab 11/05/17 0504 11/06/17 0259 11/07/17 0330 11/08/17 0253  WBC 16.9* 31.7* 20.9* 16.6*  NEUTROABS 14.5* 28.5* 18.5* 13.9*  HGB 14.9 12.5* 11.3* 11.0*  HCT 43.9 36.6* 33.0* 32.2*  MCV 94.0 93.6 94.0 93.6  PLT 289 262 216  038   Basic Metabolic Panel: Recent Labs  Lab 11/05/17 0504 11/06/17 0259 11/07/17 0330 11/08/17 0253  NA 126* 130* 135 137  K 3.8 4.5 3.9 3.8  CL 88* 102 104 105  CO2 25 20* 22 22  GLUCOSE 129* 98 98 104*  BUN 14 20 24* 20  CREATININE 1.27* 1.17 0.89 0.86  CALCIUM 9.4 7.9* 8.2* 8.2*   GFR: Estimated Creatinine Clearance: 82.5  mL/min (by C-G formula based on SCr of 0.86 mg/dL). Liver Function Tests: Recent Labs  Lab 11/05/17 0504 11/06/17 0259 11/07/17 0330  AST 28 30 40  ALT 22 20 22   ALKPHOS 66 41 49  BILITOT 0.7 0.6 0.7  PROT 7.1 5.7* 5.9*  ALBUMIN 4.0 2.9* 3.0*   Recent Labs  Lab 11/05/17 0504  LIPASE 32   No results for input(s): AMMONIA in the last 168 hours. Coagulation Profile: No results for input(s): INR, PROTIME in the last 168 hours. Cardiac Enzymes: Recent Labs  Lab 11/05/17 0504 11/05/17 1130 11/05/17 2034 11/06/17 0259  TROPONINI <0.03 <0.03 <0.03 <0.03   BNP (last 3 results) No results for input(s): PROBNP in the last 8760 hours. HbA1C: No results for input(s): HGBA1C in the last 72 hours. CBG: Recent Labs  Lab 11/07/17 1954 11/07/17 2320 11/08/17 0330 11/08/17 0732 11/08/17 1125  GLUCAP 90 110* 95 117* 104*   Lipid Profile: No results for input(s): CHOL, HDL, LDLCALC, TRIG, CHOLHDL, LDLDIRECT in the last 72 hours. Thyroid Function Tests: No results for input(s): TSH, T4TOTAL, FREET4, T3FREE, THYROIDAB in the last 72 hours. Anemia Panel: No results for input(s): VITAMINB12, FOLATE, FERRITIN, TIBC, IRON, RETICCTPCT in the last 72 hours. Urine analysis:    Component Value Date/Time   COLORURINE YELLOW 11/05/2017 [place] 11/05/2017 0538   LABSPEC 1.006 11/05/2017 0538   PHURINE 7.0 11/05/2017 0538   GLUCOSEU NEGATIVE 11/05/2017 0538   HGBUR NEGATIVE 11/05/2017 0538   BILIRUBINUR NEGATIVE 11/05/2017 0538   KETONESUR NEGATIVE 11/05/2017 0538   PROTEINUR NEGATIVE 11/05/2017 0538   NITRITE NEGATIVE 11/05/2017 0538   LEUKOCYTESUR NEGATIVE 11/05/2017 0538   Sepsis Labs: @LABRCNTIP (procalcitonin:4,lacticidven:4)  ) Recent Results (from the past 240 hour(s))  Urine culture     Status: None   Collection Time: 11/05/17  5:38 AM  Result Value Ref Range Status   Specimen Description   Final    URINE, RANDOM Performed at [facility], [address] [place], [place] [address]    Special Requests   Final    NONE Performed at [facility] [address] [place], [place] [address]    Culture   Final    NO GROWTH Performed at [facility], [place] [address] [place], [place] [address]    Report Status 11/06/2017 FINAL  Final  Blood Culture (routine x 2)     Status: None (Preliminary result)   Collection Time: 11/05/17  5:49 AM  Result Value Ref Range Status   Specimen Description BLOOD LEFT HAND  Final   Special Requests   Final    BOTTLES DRAWN AEROBIC ONLY Blood Culture results may not be optimal due to an inadequate volume of blood received in culture bottles   Culture   Final    NO GROWTH 3 DAYS Performed at [facility], [address], [place], [place] [address]    Report Status PENDING  Incomplete  Blood Culture (routine x 2)     Status: None (Preliminary result)   Collection Time: 11/05/17  6:31 AM  Result Value Ref Range Status   Specimen Description BLOOD RIGHT HAND  Final   Special Requests   Final    ANAEROBIC BOTTLE ONLY Blood Culture results may not be optimal due to an inadequate volume of blood received in culture bottles   Culture   Final    NO GROWTH 3 DAYS Performed at [facility], [address] [place], [place] [address]    Report Status PENDING  Incomplete  MRSA PCR Screening     Status: None   Collection Time: 11/05/17 12:44 PM  Result Value Ref Range Status   MRSA by PCR NEGATIVE NEGATIVE Final    Comment:        The GeneXpert MRSA Assay (FDA approved for NASAL specimens only), is one component of a comprehensive MRSA colonization surveillance program. It is not intended to diagnose MRSA infection nor to guide or monitor treatment for MRSA infections. Performed at [facility], [place] [address], [place], [place] [address]   Gastrointestinal Panel by PCR , Stool     Status: None   Collection Time: 11/06/17  1:32 PM  Result Value Ref Range Status   Campylobacter  species NOT DETECTED NOT DETECTED Final   Plesimonas shigelloides NOT DETECTED NOT DETECTED Final   Salmonella species NOT DETECTED NOT DETECTED Final   Yersinia enterocolitica NOT DETECTED NOT DETECTED Final   Vibrio species NOT DETECTED NOT DETECTED Final   Vibrio cholerae NOT DETECTED NOT DETECTED Final   Enteroaggregative E coli (EAEC) NOT DETECTED NOT DETECTED Final   Enteropathogenic E coli (EPEC) NOT DETECTED NOT DETECTED Final   Enterotoxigenic E coli (ETEC) NOT DETECTED NOT DETECTED Final   Shiga like toxin producing E coli (STEC) NOT DETECTED NOT DETECTED Final   Shigella/Enteroinvasive E coli (EIEC) NOT DETECTED NOT DETECTED Final   Cryptosporidium NOT DETECTED NOT DETECTED Final   Cyclospora cayetanensis NOT DETECTED NOT DETECTED Final   Entamoeba histolytica NOT DETECTED NOT DETECTED Final   Giardia lamblia NOT DETECTED NOT DETECTED Final   Adenovirus F40/41 NOT DETECTED NOT DETECTED Final   Astrovirus NOT DETECTED NOT DETECTED Final   Norovirus GI/GII NOT DETECTED NOT DETECTED Final   Rotavirus A NOT DETECTED NOT DETECTED Final   Sapovirus (I, II, IV, and V) NOT DETECTED NOT DETECTED Final    Comment: Performed at [facility], [address], [place], [place] [address]      Studies: No results found.  Scheduled Meds: . aspirin EC  81 mg Oral Daily  . azithromycin  500 mg Oral Daily  . citalopram  20 mg Oral Daily  . enoxaparin (LOVENOX) injection  40 mg Subcutaneous Q24H  . fluticasone  2 spray Each Nare Daily  . gabapentin  300 mg Oral q morning - 10a  . gabapentin  600 mg Oral QHS  . ipratropium-albuterol  3 mL Nebulization TID  . levothyroxine  75 mcg Oral QAC breakfast  . losartan  100 mg Oral Daily  . Melatonin  3 mg Oral QHS  . mometasone-formoterol  2 puff Inhalation BID  . montelukast  10 mg Oral QHS  . multivitamin with minerals  1 tablet Oral Daily  . pantoprazole  40 mg Oral Daily    Continuous Infusions: . cefTRIAXone (ROCEPHIN)  IV  Stopped (11/08/17 0538)     LOS: 3 days     Alma Friendly, MD [facility]   If 7PM-7AM, please contact night-coverage [email or web address] Password [facility] 11/08/2017, 12:35 PM

## 2017-11-09 ENCOUNTER — Inpatient Hospital Stay (HOSPITAL_COMMUNITY): Payer: Medicare Other

## 2017-11-09 DIAGNOSIS — R0600 Dyspnea, unspecified: Secondary | ICD-10-CM

## 2017-11-09 LAB — BASIC METABOLIC PANEL
ANION GAP: 9 (ref 5–15)
BUN: 19 mg/dL (ref 6–20)
CALCIUM: 8.3 mg/dL — AB (ref 8.9–10.3)
CO2: 26 mmol/L (ref 22–32)
CREATININE: 0.77 mg/dL (ref 0.61–1.24)
Chloride: 102 mmol/L (ref 101–111)
GFR calc Af Amer: >60 mL/min (ref >60)
GFR calc non Af Amer: >60 mL/min (ref >60)
GLUCOSE: 104 mg/dL — AB (ref 65–99)
Potassium: 4 mmol/L (ref 3.5–5.1)
Sodium: 137 mmol/L (ref 135–145)

## 2017-11-09 LAB — BLOOD GAS, ARTERIAL
Acid-Base Excess: 1.9 mmol/L (ref 0.0–2.0)
Bicarbonate: 25.6 mmol/L (ref 20.0–28.0)
Drawn by: 295031
O2 Content: 5 L/min
O2 Saturation: 94.2 %
Patient temperature: 98.6
pCO2 arterial: 38.5 mmHg (ref 32.0–48.0)
pH, Arterial: 7.438 (ref 7.350–7.450)
pO2, Arterial: 71.1 mmHg — ABNORMAL LOW (ref 83.0–108.0)

## 2017-11-09 LAB — CBC WITH DIFFERENTIAL/PLATELET
BASOS ABS: 0 10*3/uL (ref 0.0–0.1)
BASOS PCT: 0 %
Eosinophils Absolute: 0.2 10*3/uL (ref 0.0–0.7)
Eosinophils Relative: 2 %
HCT: 30.3 % — ABNORMAL LOW (ref 39.0–52.0)
Hemoglobin: 10.6 g/dL — ABNORMAL LOW (ref 13.0–17.0)
Lymphocytes Relative: 11 %
Lymphs Abs: 1.1 10*3/uL (ref 0.7–4.0)
MCH: 32.7 pg (ref 26.0–34.0)
MCHC: 35 g/dL (ref 30.0–36.0)
MCV: 93.5 fL (ref 78.0–100.0)
MONO ABS: 2.1 10*3/uL — AB (ref 0.1–1.0)
MONOS PCT: 21 %
Neutro Abs: 6.3 10*3/uL (ref 1.7–7.7)
Neutrophils Relative %: 66 %
PLATELETS: 215 10*3/uL (ref 150–400)
RBC: 3.24 MIL/uL — ABNORMAL LOW (ref 4.22–5.81)
RDW: 13.4 % (ref 11.5–15.5)
WBC: 9.6 10*3/uL (ref 4.0–10.5)

## 2017-11-09 LAB — ECHOCARDIOGRAM COMPLETE
Height: 68 in
Weight: 4014.14 oz

## 2017-11-09 LAB — GLUCOSE, CAPILLARY
Glucose-Capillary: 101 mg/dL — ABNORMAL HIGH (ref 65–99)
Glucose-Capillary: 101 mg/dL — ABNORMAL HIGH (ref 65–99)

## 2017-11-09 LAB — LEGIONELLA PNEUMOPHILA SEROGP 1 UR AG: L. PNEUMOPHILA SEROGP 1 UR AG: NEGATIVE

## 2017-11-09 LAB — PROCALCITONIN: Procalcitonin: 5.26 ng/mL

## 2017-11-09 MED ORDER — HYDRALAZINE HCL 20 MG/ML IJ SOLN
20.0000 mg | Freq: Once | INTRAMUSCULAR | Status: AC
  Administered 2017-11-09: 20 mg via INTRAVENOUS
  Filled 2017-11-09: qty 1

## 2017-11-09 MED ORDER — AMLODIPINE BESYLATE 10 MG PO TABS: 10.0000 mg | ORAL_TABLET | Freq: Every day | ORAL | Status: DC

## 2017-11-09 MED ORDER — FUROSEMIDE 10 MG/ML IJ SOLN
40.0000 mg | Freq: Once | INTRAMUSCULAR | Status: AC
  Administered 2017-11-09: 40 mg via INTRAVENOUS
  Filled 2017-11-09: qty 4

## 2017-11-09 MED ORDER — HYDROCHLOROTHIAZIDE 25 MG PO TABS
25.0000 mg | ORAL_TABLET | Freq: Every day | ORAL | Status: DC
  Administered 2017-11-09 – 2017-11-13 (×5): 25 mg via ORAL
  Filled 2017-11-09 (×5): qty 1

## 2017-11-09 MED ORDER — METHYLPREDNISOLONE SODIUM SUCC 125 MG IJ SOLR
60.0000 mg | Freq: Three times a day (TID) | INTRAMUSCULAR | Status: DC
Start: 2017-11-09 — End: 2017-11-10
  Administered 2017-11-09 – 2017-11-10 (×5): 60 mg via INTRAVENOUS
  Filled 2017-11-09 (×5): qty 2

## 2017-11-09 NOTE — Progress Notes (Addendum)
PROGRESS NOTE  Cristian Yang HGD:[id number] DOB: 1935-06-02 DOA: 11/05/2017 PCP: Rogue Bussing, MD  HPI/Recap of past 24 hours: 83 y/o male with PMH of COPD, OSA on CPAP, GERD, HTN, hypothyroidism, admitted 4/17 after being found down in the bathroom by his wife, confused with SOB, N/V, diarrhea noted PTA. Wife also noted some vomiting of which happens occasionally while pt is wearing his CPAP. Pt was admitted by the ICU team and intubated on 11/05/17 for airway protection and extubated on 11/06/17. Pt was managed for acute respiratory failure due to CAP and w/u for ?syncope. TRH was asked to take over care on 11/07/17.  Today, met pt sitting on the chair eating breakfast, more dyspneic, wheezing noted. Pt denies any chest pain, abdominal pain, N/V, fever/chills, diarrhea.   Assessment/Plan: Active Problems:   Acute respiratory failure (HCC)  Acute hypoxic respiratory failure 2/2 CAP & COPD exacerbation Noted to be more dyspneic, wheezing, still requiring O2 Afebrile, with resolving leukocytosis S/P mechanical ventilation, intubated on 4/17, extubated on 4/18 ABG with hypoxia Strep pneumo negative, Legionella urine-pending Pro-calcitonin 13.85-->7.74-->5.26 BC x 2, NGTD CXR with LUL & LLL PNA, repeat CXR showed unchanged PNA. No congestion Continue IV Rocephin, azithromycin Started IV solumedrol Monitor closely  Septic shock likely due to CAP Resolved, alert Initially required Levophed, currently weaned off BP has remained stable/high Workup as above  Syncope Likely due to above, sepsis/hypoxia CT head negative Troponin x3-, EKG no acute ST changes  Acute distal colitis/proctitis Resolved CT abdomen showed mild acute sigmoid and rectum wall thickening with surrounding strandy edema/inflammation compatible with distal colitis/proctitis Stool GI panel: Negative Continue antibiotics as above  GERD Worse, hx of vomiting in the CPAP Unsure if compliant to PPI at  home due to cost Continue PPI Spoke to GI, will see as an outpt  COPD  Continue  O2 Continue duonebs, inhalers, singulair Started IV solumedrol Further management as above  OSA Continue CPAP  HTN Continue home losartan, HCT  Hypothyroidism Continue synthroid  Chronic back pain Continue Gabapentin, norco  Depression/insomnia  Continue Celexa, melatonin    Code Status: Full  Family Communication: None at bedside  Disposition Plan: Likely SNF   Consultants:  PCCM  Procedures:  Mechanical ventilation on 11/05/17  Antimicrobials:  IV Rocephin  IV azithromycin  DVT prophylaxis: Lovenox   Objective: Vitals:   11/09/17 0700 11/09/17 0800 11/09/17 0936 11/09/17 1200  BP: (!) 167/65     Pulse: 73     Resp: (!) 21     Temp:  98 F (36.7 C)  98 F (36.7 C)  TempSrc:  Oral  Oral  SpO2: (!) 88%  100%   Weight:      Height:        Intake/Output Summary (Last 24 hours) at 11/09/2017 1326 Last data filed at 11/09/2017 1302 Gross per 24 hour  Intake 880 ml  Output 1625 ml  Net -745 ml   Filed Weights   11/05/17 0443 11/06/17 0400  Weight: 107 kg (236 lb) 113.8 kg (250 lb 14.1 oz)    Exam:   General: Restless, NAD  Cardiovascular: S1, S2 present  Respiratory: Diminished breath sounds bilaterally, wheezing noted  Abdomen: Soft, distended, nontender, bowel sounds present  Musculoskeletal: No pedal edema bilaterally  Skin: Normal  Psychiatry: Normal mood   Data Reviewed: CBC: Recent Labs  Lab 11/05/17 0504 11/06/17 0259 11/07/17 0330 11/08/17 0253 11/09/17 0334  WBC 16.9* 31.7* 20.9* 16.6* 9.6  NEUTROABS 14.5* 28.5* 18.5* 13.9*  6.3  HGB 14.9 12.5* 11.3* 11.0* 10.6*  HCT 43.9 36.6* 33.0* 32.2* 30.3*  MCV 94.0 93.6 94.0 93.6 93.5  PLT 289 262 216 199 734   Basic Metabolic Panel: Recent Labs  Lab 11/05/17 0504 11/06/17 0259 11/07/17 0330 11/08/17 0253 11/09/17 0334  NA 126* 130* 135 137 137  K 3.8 4.5 3.9 3.8 4.0  CL 88*  102 104 105 102  CO2 25 20* 22 22 26   GLUCOSE 129* 98 98 104* 104*  BUN 14 20 24* 20 19  CREATININE 1.27* 1.17 0.89 0.86 0.77  CALCIUM 9.4 7.9* 8.2* 8.2* 8.3*   GFR: Estimated Creatinine Clearance: 88.7 mL/min (by C-G formula based on SCr of 0.77 mg/dL). Liver Function Tests: Recent Labs  Lab 11/05/17 0504 11/06/17 0259 11/07/17 0330  AST 28 30 40  ALT 22 20 22   ALKPHOS 66 41 49  BILITOT 0.7 0.6 0.7  PROT 7.1 5.7* 5.9*  ALBUMIN 4.0 2.9* 3.0*   Recent Labs  Lab 11/05/17 0504  LIPASE 32   No results for input(s): AMMONIA in the last 168 hours. Coagulation Profile: No results for input(s): INR, PROTIME in the last 168 hours. Cardiac Enzymes: Recent Labs  Lab 11/05/17 0504 11/05/17 1130 11/05/17 2034 11/06/17 0259  TROPONINI <0.03 <0.03 <0.03 <0.03   BNP (last 3 results) No results for input(s): PROBNP in the last 8760 hours. HbA1C: No results for input(s): HGBA1C in the last 72 hours. CBG: Recent Labs  Lab 11/08/17 1523 11/08/17 1948 11/08/17 2328 11/09/17 0333 11/09/17 0749  GLUCAP 105* 131* 104* 101* 101*   Lipid Profile: No results for input(s): CHOL, HDL, LDLCALC, TRIG, CHOLHDL, LDLDIRECT in the last 72 hours. Thyroid Function Tests: No results for input(s): TSH, T4TOTAL, FREET4, T3FREE, THYROIDAB in the last 72 hours. Anemia Panel: No results for input(s): VITAMINB12, FOLATE, FERRITIN, TIBC, IRON, RETICCTPCT in the last 72 hours. Urine analysis:    Component Value Date/Time   COLORURINE YELLOW 11/05/2017 [place] 11/05/2017 0538   LABSPEC 1.006 11/05/2017 0538   PHURINE 7.0 11/05/2017 0538   GLUCOSEU NEGATIVE 11/05/2017 0538   HGBUR NEGATIVE 11/05/2017 0538   BILIRUBINUR NEGATIVE 11/05/2017 0538   KETONESUR NEGATIVE 11/05/2017 0538   PROTEINUR NEGATIVE 11/05/2017 0538   NITRITE NEGATIVE 11/05/2017 0538   LEUKOCYTESUR NEGATIVE 11/05/2017 0538   Sepsis Labs: @LABRCNTIP (procalcitonin:4,lacticidven:4)  ) Recent Results (from  the past 240 hour(s))  Urine culture     Status: None   Collection Time: 11/05/17  5:38 AM  Result Value Ref Range Status   Specimen Description   Final    URINE, RANDOM Performed at [facility], [address] [place], [place] [address]    Special Requests   Final    NONE Performed at [facility], [address], [place], [place] [address]    Culture   Final    NO GROWTH Performed at [facility], [place] [address], [place], [place] [address]    Report Status 11/06/2017 FINAL  Final  Blood Culture (routine x 2)     Status: None (Preliminary result)   Collection Time: 11/05/17  5:49 AM  Result Value Ref Range Status   Specimen Description BLOOD LEFT HAND  Final   Special Requests   Final    BOTTLES DRAWN AEROBIC ONLY Blood Culture results may not be optimal due to an inadequate volume of blood received in culture bottles   Culture   Final    NO GROWTH 4 DAYS Performed at [facility], [address] [place], [place] [address]    Report Status PENDING  Incomplete  Blood Culture (routine x 2)     Status: None (Preliminary result)   Collection Time: 11/05/17  6:31 AM  Result Value Ref Range Status   Specimen Description BLOOD RIGHT HAND  Final   Special Requests   Final    ANAEROBIC BOTTLE ONLY Blood Culture results may not be optimal due to an inadequate volume of blood received in culture bottles   Culture   Final    NO GROWTH 4 DAYS Performed at [facility], [address], [place], [place] [address]    Report Status PENDING  Incomplete  MRSA PCR Screening     Status: None   Collection Time: 11/05/17 12:44 PM  Result Value Ref Range Status   MRSA by PCR NEGATIVE NEGATIVE Final    Comment:        The GeneXpert MRSA Assay (FDA approved for NASAL specimens only), is one component of a comprehensive MRSA colonization surveillance program. It is not intended to diagnose MRSA infection nor to guide or monitor treatment for MRSA infections. Performed at [facility], [place] [address] [place], [place] [address]   Gastrointestinal Panel by PCR , Stool     Status: None   Collection Time: 11/06/17  1:32 PM  Result Value Ref Range Status   Campylobacter species NOT DETECTED NOT DETECTED Final   Plesimonas shigelloides NOT DETECTED NOT DETECTED Final   Salmonella species NOT DETECTED NOT DETECTED Final   Yersinia enterocolitica NOT DETECTED NOT DETECTED Final   Vibrio species NOT DETECTED NOT DETECTED Final   Vibrio cholerae NOT DETECTED NOT DETECTED Final   Enteroaggregative E coli (EAEC) NOT DETECTED NOT DETECTED Final   Enteropathogenic E coli (EPEC) NOT DETECTED NOT DETECTED Final   Enterotoxigenic E coli (ETEC) NOT DETECTED NOT DETECTED Final   Shiga like toxin producing E coli (STEC) NOT DETECTED NOT DETECTED Final   Shigella/Enteroinvasive E coli (EIEC) NOT DETECTED NOT DETECTED Final   Cryptosporidium NOT DETECTED NOT DETECTED Final   Cyclospora cayetanensis NOT DETECTED NOT DETECTED Final   Entamoeba histolytica NOT DETECTED NOT DETECTED Final   Giardia lamblia NOT DETECTED NOT DETECTED Final   Adenovirus F40/41 NOT DETECTED NOT DETECTED Final   Astrovirus NOT DETECTED NOT DETECTED Final   Norovirus GI/GII NOT DETECTED NOT DETECTED Final   Rotavirus A NOT DETECTED NOT DETECTED Final   Sapovirus (I, II, IV, and V) NOT DETECTED NOT DETECTED Final    Comment: Performed at [facility], [address] [place], [place] [address]      Studies: Dg Chest Port 1 View  Result Date: 11/09/2017 CLINICAL DATA:  Increasing abdominal distension and increasing shortness of breath and dyspnea for 1 day. EXAM: PORTABLE CHEST 1 VIEW COMPARISON:  11/07/2017. FINDINGS: Stable enlarged cardiac silhouette. Stable prominence of the interstitial markings. No overall significant change in patchy opacity in the left mid and lower lung zones. Thoracic spine degenerative changes. Cervical spine fixation hardware. IMPRESSION: 1. No significant  change in pneumonia in the left mid and lower lung zones. 2. Stable cardiomegaly and chronic interstitial lung disease. Electronically Signed   By: Claudie Revering M.D.   On: 11/09/2017 09:23   Dg Abd Portable 2v  Result Date: 11/09/2017 CLINICAL DATA:  Increasing abdominal distension. EXAM: PORTABLE ABDOMEN - 2 VIEW COMPARISON:  11/05/2017. FINDINGS: Paucity of intestinal gas. No visible dilated bowel loops. No free peritoneal air. Thoracolumbar spine  degenerative changes and mild scoliosis. IMPRESSION: No acute abnormality. Electronically Signed   By: Claudie Revering M.D.   On: 11/09/2017 09:22    Scheduled Meds: . aspirin EC  81 mg Oral Daily  . azithromycin  500 mg Oral Daily  . citalopram  20 mg Oral Daily  . enoxaparin (LOVENOX) injection  40 mg Subcutaneous Q24H  . fluticasone  2 spray Each Nare Daily  . gabapentin  300 mg Oral q morning - 10a  . gabapentin  600 mg Oral QHS  . hydrochlorothiazide  25 mg Oral Daily  . ipratropium-albuterol  3 mL Nebulization TID  . levothyroxine  75 mcg Oral QAC breakfast  . losartan  100 mg Oral Daily  . Melatonin  3 mg Oral QHS  . methylPREDNISolone (SOLU-MEDROL) injection  60 mg Intravenous Q8H  . mometasone-formoterol  2 puff Inhalation BID  . montelukast  10 mg Oral QHS  . multivitamin with minerals  1 tablet Oral Daily  . pantoprazole  40 mg Oral Daily    Continuous Infusions: . cefTRIAXone (ROCEPHIN)  IV Stopped (11/09/17 0603)     LOS: 4 days     Alma Friendly, MD [facility]   If 7PM-7AM, please contact night-coverage [email or web address] Password [email or web address] 11/09/2017, 1:26 PM

## 2017-11-09 NOTE — Evaluation (Signed)
Physical Therapy Evaluation Patient Details Name: Cristian Yang MRN: [id number] DOB: 1934-10-16 Today's Date: 11/09/2017   History of Present Illness  83 yo male found down with SOB, N/V, diarrhea, intubated 11/05/17 extubated 11/06/17. Pt being treated for acute respiratory failure due to CAP / septic shock PMH: COPD, OSA, CPAP, GERD, HTN, hypothyroidism   Clinical Impression  Pt admitted with above diagnosis. Pt currently with functional limitations due to the deficits listed below (see PT Problem List). Pt is independent at his baseline, does majority of household management since his wife is legally blind; will likely benefit from SNF --depending on progress/LOS may be able to D/C with HHPT;  Pt will benefit from skilled PT to increase their independence and safety with mobility to allow discharge to the venue listed below.       Follow Up Recommendations SNF    Equipment Recommendations  Other (comment)(defer to SNF/TBD if home)    Recommendations for Other Services       Precautions / Restrictions Precautions Precautions: Fall Precaution Comments: monitor O2 Restrictions Weight Bearing Restrictions: No      Mobility  Bed Mobility               General bed mobility comments: OOB in chair on PT arrival  Transfers Overall transfer level: Needs assistance Equipment used: Rolling walker (2 wheeled) Transfers: Sit to/from Stand Sit to Stand: Min guard;Min assist         General transfer comment: cues for hand placement, incr time needed  Ambulation/Gait Ambulation/Gait assistance: Min assist;+2 safety/equipment Ambulation Distance (Feet): 40 Feet Assistive device: Rolling walker (2 wheeled) Gait Pattern/deviations: Step-through pattern;Decreased stride length;Trunk flexed     General Gait Details: cues for RW position, breathing, posture; one standing rest d/t DOE; Pt on 4-6L O2 during amb with SpO2=89-96%, HR 80s, 3/4DOE, BP stable  Stairs             Wheelchair Mobility    Modified Rankin (Stroke Patients Only)       Balance Overall balance assessment: Needs assistance;History of Falls   Sitting balance-Leahy Scale: Good     Standing balance support: Bilateral upper extremity supported;During functional activity   Standing balance comment: reliant on UEs                             Pertinent Vitals/Pain Pain Assessment: No/denies pain    Home Living Family/patient expects to be discharged to:: Private residence Living Arrangements: Spouse/significant other Available Help at Discharge: Neighbor;Available PRN/intermittently Type of Home: Mobile home Home Access: Ramped entrance     Home Layout: One level Home Equipment: Shower seat;Hand held shower head Additional Comments: pt reports the church put the ramp on the house. Wife is legally blind and requires (A) per OT notes---pt denies wife needed physical assist for ADLS or mobility during PT eval (?); pt does all the driving, cooking and cleaning. they have a 24 yo cat named "Tiger" pt reports no family in the area and nearest relative is daughter in [place].     Prior Function Level of Independence: Independent         Comments: primary caregiver for wife who is fairly independent per pt report (to PT) although she does not drive or prepare meals     Hand Dominance        Extremity/Trunk Assessment   Upper Extremity Assessment Upper Extremity Assessment: Generalized weakness;Defer to OT evaluation    Lower Extremity  Assessment Lower Extremity Assessment: RLE deficits/detail;LLE deficits/detail RLE Deficits / Details: grossly 3+ to 4/5, AROM WFL;  fatigues rapidly durign functional tasks/amb LLE Deficits / Details: grossly 3+ to 4/5, AROM WFL;  fatigues rapidly durign functional tasks/amb       Communication      Cognition Arousal/Alertness: Awake/alert Behavior During Therapy: WFL for tasks assessed/performed Overall Cognitive Status:  Within Functional Limits for tasks assessed                                 General Comments: talks excessively, requires cues to stop talking and breathe during mobility      General Comments      Exercises     Assessment/Plan    PT Assessment Patient needs continued PT services  PT Problem List Decreased strength;Decreased range of motion;Decreased activity tolerance;Decreased mobility;Decreased safety awareness;Decreased knowledge of use of DME;Decreased balance       PT Treatment Interventions DME instruction;Gait training;Functional mobility training;Therapeutic activities;Therapeutic exercise;Patient/family education;Balance training    PT Goals (Current goals can be found in the Care Plan section)  Acute Rehab PT Goals Patient Stated Goal: to see cat  PT Goal Formulation: With patient Time For Goal Achievement: 11/23/17 Potential to Achieve Goals: Good    Frequency Min 2X/week   Barriers to discharge        Co-evaluation               AM-PAC PT "6 Clicks" Daily Activity  Outcome Measure Difficulty turning over in bed (including adjusting bedclothes, sheets and blankets)?: Unable Difficulty moving from lying on back to sitting on the side of the bed? : Unable Difficulty sitting down on and standing up from a chair with arms (e.g., wheelchair, bedside commode, etc,.)?: Unable Help needed moving to and from a bed to chair (including a wheelchair)?: A Little Help needed walking in hospital room?: A Little Help needed climbing 3-5 steps with a railing? : A Lot 6 Click Score: 11    End of Session Equipment Utilized During Treatment: Gait belt Activity Tolerance: Patient limited by fatigue Patient left: in chair;with call bell/phone within reach;with chair alarm set        Time: 1430-1454 PT Time Calculation (min) (ACUTE ONLY): 24 min   Charges:   PT Evaluation $PT Eval Low Complexity: 1 Low PT Treatments $Gait Training: 8-22 mins    PT G CodesKenyon Yang, PT Pager: [phone] 11/09/2017   [facility] 11/09/2017, 3:53 PM

## 2017-11-09 NOTE — Progress Notes (Signed)
10mg  hydralazine given for B/P 195/87, will continue to follow

## 2017-11-09 NOTE — Evaluation (Signed)
Occupational Therapy Evaluation Patient Details Name: Cristian Yang MRN: [id number] DOB: [birth year] Today's Date: 11/09/2017    History of Present Illness 83 yo male found down with SOB, N/V, diarrhea, intubated 11/05/17 extubated 11/06/17. Pt being treated for acute respiratory failure due to CAP / septic shock PMH: COPD, OSA, CPAP, GERD, HTN, hypothyroidism    Clinical Impression   PT admitted with acute respiratory failure. Pt currently with functional limitiations due to the deficits listed below (see OT problem list). Pt currently requires 5 L oxygen with DOE 3 out 4 with basic transfer. Pt has no family support in area and caregiver for wife. Wife is legally blind and home alone with dependence on asking a neighbor for help and recent admission hospital 11/07/17 due to ankle injury per patient. Pt currently requires min (A) for adls and can not provide care for wife at current levels.   Pt will benefit from skilled OT to increase their independence and safety with adls and balance to allow discharge SNF.     Follow Up Recommendations  SNF    Equipment Recommendations  3 in 1 bedside commode    Recommendations for Other Services       Precautions / Restrictions Precautions Precautions: Fall Precaution Comments: watch 02 / BP Restrictions Weight Bearing Restrictions: No      Mobility Bed Mobility Overal bed mobility: Needs Assistance Bed Mobility: Supine to Sit     Supine to sit: Min guard        Transfers Overall transfer level: Needs assistance Equipment used: 1 person hand held assist Transfers: Sit to/from Stand Sit to Stand: Min assist         General transfer comment: pt requires bil le bracing against bed and hand held (A) to power up . pt able to complete stand pivot to chair for adls    Balance Overall balance assessment: Needs assistance Sitting-balance support: Bilateral upper extremity supported;Feet supported Sitting balance-Leahy Scale: Good      Standing balance support: Bilateral upper extremity supported;During functional activity Standing balance-Leahy Scale: Poor                             ADL either performed or assessed with clinical judgement   ADL Overall ADL's : Needs assistance/impaired Eating/Feeding: Independent Eating/Feeding Details (indicate cue type and reason): pt with food in dentures and producing egg from breakfast with talking throughtout session Grooming: Wash/dry face;Wash/dry hands;Applying deodorant;Oral care;Set up;Sitting Grooming Details (indicate cue type and reason): pt able to complete grooming with incr time  Upper Body Bathing: Min guard;Sitting Upper Body Bathing Details (indicate cue type and reason): pt required extended time and rest breaks Lower Body Bathing: Moderate assistance;Sit to/from stand Lower Body Bathing Details (indicate cue type and reason): incr time and rest breaks  Upper Body Dressing : Minimal assistance       Toilet Transfer: Minimal assistance Toilet Transfer Details (indicate cue type and reason): simulated OOB to chair            General ADL Comments: pt requires incr time to complete all adls and to rebound from activity due to SOB. pt  3 ot 4 DOE. pt with oxygen saturations >93%  5 L throughoiut session     Vision Baseline Vision/History: Wears glasses Wears Glasses: At all times       Perception     Praxis      Pertinent Vitals/Pain Pain Assessment: No/denies pain  Hand Dominance Right   Extremity/Trunk Assessment Upper Extremity Assessment Upper Extremity Assessment: Generalized weakness   Lower Extremity Assessment Lower Extremity Assessment: Generalized weakness   Cervical / Trunk Assessment Cervical / Trunk Assessment: Kyphotic   Communication Communication Communication: HOH   Cognition Arousal/Alertness: Awake/alert Behavior During Therapy: WFL for tasks assessed/performed Overall Cognitive Status: Within  Functional Limits for tasks assessed                                     General Comments  pt currently requiring 5 L of oxygen. pt noted to have edema at scrotum during session. pt reports 'it has been swollen since that tube was in me" pt placed on rolled towel to help with elevation of the scrotum to for edema management. pt could benefit from brief underwear with movement to prevent discomfort with movement due to pressure downward with gravity    Exercises     Shoulder Instructions      Home Living Family/patient expects to be discharged to:: Private residence Living Arrangements: Spouse/significant other Available Help at Discharge: Neighbor;Available PRN/intermittently Type of Home: Mobile home Home Access: Ramped entrance     Home Layout: One level     Bathroom Shower/Tub: Teacher, early years/pre: Standard     Home Equipment: Shower seat;Hand held shower head   Additional Comments: pt reports the church put the ramp on the house. Wife is legally blind and requires (A) from patient at baseline. pt does all the driving cookign and cleaning. they have a 39 yo cat named "Tiger" pt reports no family in the area and nearest relative is daughter in [place].       Prior Functioning/Environment Level of Independence: Independent        Comments: primary care giver for wife        OT Problem List: Decreased activity tolerance;Decreased knowledge of precautions;Decreased knowledge of use of DME or AE;Decreased safety awareness;Decreased strength;Obesity;Cardiopulmonary status limiting activity;Impaired balance (sitting and/or standing)      OT Treatment/Interventions: Self-care/ADL training;Therapeutic exercise;Therapeutic activities;Energy conservation;DME and/or AE instruction;Patient/family education;Balance training    OT Goals(Current goals can be found in the care plan section) Acute Rehab OT Goals Patient Stated Goal: to see cat  OT Goal  Formulation: With patient Time For Goal Achievement: 11/23/17 Potential to Achieve Goals: Good  OT Frequency: Min 2X/week   Barriers to D/C: Decreased caregiver support  wife unable to (A) patient at this time and currently alone at home with limited resources. neighbors have (A) wife once. pt with injury to ankle 11/07/17 and seen at hospital but released back home alone       Co-evaluation              AM-PAC PT "6 Clicks" Daily Activity     Outcome Measure Help from another person eating meals?: None Help from another person taking care of personal grooming?: None Help from another person toileting, which includes using toliet, bedpan, or urinal?: A Little Help from another person bathing (including washing, rinsing, drying)?: A Little Help from another person to put on and taking off regular upper body clothing?: A Little Help from another person to put on and taking off regular lower body clothing?: A Lot 6 Click Score: 19   End of Session Equipment Utilized During Treatment: Oxygen Nurse Communication: Mobility status;Precautions  Activity Tolerance: Patient tolerated treatment well Patient left: in chair;with call bell/phone  within reach;with chair alarm set  OT Visit Diagnosis: Unsteadiness on feet (R26.81);Muscle weakness (generalized) (M62.81)                Time: 7902-4097 OT Time Calculation (min): 36 min Charges:  OT General Charges $OT Visit: 1 Visit OT Evaluation $OT Eval Moderate Complexity: 1 Mod OT Treatments $Self Care/Home Management : 8-22 mins G-Codes:      Jeri Modena   OTR/L Pager: [phone] Office: [phone] .   Parke Poisson B 11/09/2017, 9:50 AM

## 2017-11-09 NOTE — Progress Notes (Signed)
CPT held at this time, pt up in chair.  RT to monitor and assess as needed.

## 2017-11-09 NOTE — Progress Notes (Signed)
  Echocardiogram 2D Echocardiogram has been performed.  Merrie Roof F 11/09/2017, 10:33 AM

## 2017-11-10 DIAGNOSIS — K529 Noninfective gastroenteritis and colitis, unspecified: Secondary | ICD-10-CM

## 2017-11-10 DIAGNOSIS — J441 Chronic obstructive pulmonary disease with (acute) exacerbation: Secondary | ICD-10-CM

## 2017-11-10 LAB — BASIC METABOLIC PANEL
Anion gap: 12 (ref 5–15)
BUN: 20 mg/dL (ref 6–20)
CHLORIDE: 97 mmol/L — AB (ref 101–111)
CO2: 26 mmol/L (ref 22–32)
CREATININE: 0.86 mg/dL (ref 0.61–1.24)
Calcium: 8.4 mg/dL — ABNORMAL LOW (ref 8.9–10.3)
GFR calc Af Amer: >60 mL/min (ref >60)
GFR calc non Af Amer: >60 mL/min (ref >60)
Glucose, Bld: 152 mg/dL — ABNORMAL HIGH (ref 65–99)
Potassium: 3.3 mmol/L — ABNORMAL LOW (ref 3.5–5.1)
SODIUM: 135 mmol/L (ref 135–145)

## 2017-11-10 LAB — CBC WITH DIFFERENTIAL/PLATELET
Basophils Absolute: 0 10*3/uL (ref 0.0–0.1)
Basophils Relative: 0 %
EOS ABS: 0 10*3/uL (ref 0.0–0.7)
Eosinophils Relative: 0 %
HEMATOCRIT: 31.9 % — AB (ref 39.0–52.0)
HEMOGLOBIN: 11.2 g/dL — AB (ref 13.0–17.0)
LYMPHS ABS: 0.5 10*3/uL — AB (ref 0.7–4.0)
LYMPHS PCT: 10 %
MCH: 32.2 pg (ref 26.0–34.0)
MCHC: 35.1 g/dL (ref 30.0–36.0)
MCV: 91.7 fL (ref 78.0–100.0)
MONOS PCT: 11 %
Monocytes Absolute: 0.5 10*3/uL (ref 0.1–1.0)
NEUTROS ABS: 3.8 10*3/uL (ref 1.7–7.7)
NEUTROS PCT: 79 %
Platelets: 258 10*3/uL (ref 150–400)
RBC: 3.48 MIL/uL — AB (ref 4.22–5.81)
RDW: 13 % (ref 11.5–15.5)
WBC: 4.8 10*3/uL (ref 4.0–10.5)

## 2017-11-10 LAB — CULTURE, BLOOD (ROUTINE X 2)
CULTURE: NO GROWTH
Culture: NO GROWTH

## 2017-11-10 MED ORDER — METHYLPREDNISOLONE SODIUM SUCC 40 MG IJ SOLR
40.0000 mg | Freq: Two times a day (BID) | INTRAMUSCULAR | Status: DC
  Administered 2017-11-10 – 2017-11-13 (×6): 40 mg via INTRAVENOUS
  Filled 2017-11-10 (×6): qty 1

## 2017-11-10 MED ORDER — POTASSIUM CHLORIDE CRYS ER 20 MEQ PO TBCR
40.0000 meq | EXTENDED_RELEASE_TABLET | Freq: Once | ORAL | Status: AC
  Administered 2017-11-10: 40 meq via ORAL
  Filled 2017-11-10: qty 2

## 2017-11-10 MED ORDER — PREMIER PROTEIN SHAKE
11.0000 [oz_av] | ORAL | Status: DC
  Administered 2017-11-10 – 2017-11-13 (×4): 11 [oz_av] via ORAL
  Filled 2017-11-10 (×4): qty 325.31

## 2017-11-10 MED ORDER — BENZONATATE 100 MG PO CAPS
200.0000 mg | ORAL_CAPSULE | Freq: Two times a day (BID) | ORAL | Status: DC | PRN
  Administered 2017-11-10 – 2017-11-12 (×5): 200 mg via ORAL
  Filled 2017-11-10 (×5): qty 2

## 2017-11-10 NOTE — Progress Notes (Signed)
Nutrition Follow-up  DOCUMENTATION CODES:   Obesity unspecified  INTERVENTION:  - Continue to encourage PO intakes.  - Will trial Premier Protein once/day, this supplement provides 160 kcal and 30 grams of protein. - Will order for new weight to be obtained today.    NUTRITION DIAGNOSIS:   Inadequate oral intake related to acute illness, decreased appetite as evidenced by per patient/family report, other (comment)(current PO intakes. ). -revised.  GOAL:   Patient will meet greater than or equal to 90% of their needs -unmet at this time.   MONITOR:   PO intake, Supplement acceptance, Weight trends, Labs  ASSESSMENT:   [name] with PMH of COPD, OSA on CPAP admitted 4/17 after being found down in the bathroom by his wife confused and short of breath. There were concerns for vomiting & diarrhea.  He was intubated for airway protection.  Concern for CAP with hypoxia vs syncope episode.   No weight obtained since 4/18; will order for one today given diuretics orders. Pt on Soft diet and consumed 90% of breakfast (485 kcal, 18 grams of protein) and dinner (288 kcal, 4 grams of protein) yesterday. Pt was seen by another RD on 4/19 and pt was refusing ONS at that time and continues at this time but will trial Premier to try to increase protein consumption.   Per Dr. Serena Colonel note yesterday afternoon: hypoxic respiratory failure 2/2 CAP and COPD exacerbation, strep test negative and Legionella urine-pending, CXR showed LUL and LLL PNA, resolved septic shock, CT abdomen findings consistent with distal colitis/proctitis, GI panel negative.  Medications reviewed; 25 mg Hydrodiuril/day, 75 mcg oral Synthroid/day, 60 mg Solu-medrol TID, daily multivitamin with minerals, 40 mg Protonix/day, 40 mEq oral KCl x1 dose today. Labs reviewed; K: 3.3 mmol/L, Cl: 97 mmol/L, Ca: 8.4 mg/dL.    Diet Order:  DIET SOFT Room service appropriate? Yes; Fluid consistency: Thin  EDUCATION NEEDS:   No  education needs have been identified at this time  Skin:  Skin Assessment: Reviewed RN Assessment  Last BM:  4/22  Height:   Ht Readings from Last 1 Encounters:  11/05/17 5\' 8"  (1.727 m)    Weight:   Wt Readings from Last 1 Encounters:  11/06/17 250 lb 14.1 oz (113.8 kg)    Ideal Body Weight:  70 kg  BMI:  Body mass index is 38.15 kg/m.  Estimated Nutritional Needs:   Kcal:  1800-2000 kcal/day (MSJ x 1.0-1.1)  Protein:  100-115 grams/day  Fluid:  >/= 1.8 L/day     Jarome Matin, MS, RD, LDN, [facility] Inpatient Clinical Dietitian Pager # [phone] After hours/weekend pager # [phone]

## 2017-11-10 NOTE — Plan of Care (Signed)
  Problem: Nutrition: Goal: Adequate nutrition will be maintained Outcome: Progressing   Problem: Elimination: Goal: Will not experience complications related to bowel motility Outcome: Progressing   Problem: Safety: Goal: Ability to remain free from injury will improve Outcome: Progressing   

## 2017-11-10 NOTE — Progress Notes (Signed)
PROGRESS NOTE  Cristian Yang TFT:[id number] DOB: 01-May-1935 DOA: 11/05/2017 PCP: Rogue Bussing, MD  HPI/Recap of past 24 hours: 83 y/o male with PMH of COPD, OSA on CPAP, GERD, HTN, hypothyroidism, admitted 4/17 after being found down in the bathroom by his wife, confused with SOB, N/V, diarrhea noted PTA. Wife also noted some vomiting of which happens occasionally while pt is wearing his CPAP. Pt was admitted by the ICU team and intubated on 11/05/17 for airway protection and extubated on 11/06/17. Pt was managed for acute respiratory failure due to CAP and w/u for ?syncope. TRH was asked to take over care on 11/07/17.  Today, met pt once again sitting on the chair, looked more comfortable today, steady improvement. Pt denies any chest pain, abdominal pain, N/V, fever/chills, diarrhea.   Assessment/Plan: Active Problems:   Acute respiratory failure (HCC)  Acute hypoxic respiratory failure 2/2 CAP & COPD exacerbation Still requiring O2 Afebrile, with resolved leukocytosis S/P mechanical ventilation, intubated on 4/17, extubated on 4/18 ABG with hypoxia Strep pneumo negative, Legionella urine negative Pro-calcitonin 13.85-->7.74-->5.26 BC x 2, NGTD CXR with LUL & LLL PNA, repeat CXR showed unchanged PNA. No congestion Continue IV Rocephin, azithromycin Continued tapered dose of IV solumedrol Monitor closely  Septic shock likely due to CAP Resolved, alert Initially required Levophed, currently weaned off BP has remained stable/high Workup as above  Syncope Likely due to above, sepsis/hypoxia CT head negative Troponin x3-, EKG no acute ST changes  Acute distal colitis/proctitis Resolved CT abdomen showed mild acute sigmoid and rectum wall thickening with surrounding strandy edema/inflammation compatible with distal colitis/proctitis Stool GI panel: Negative Continue antibiotics as above  GERD Worse, hx of vomiting in the CPAP Unsure if compliant to PPI at home  due to cost Continue PPI Spoke to GI, will see as an outpt  COPD  Continue [place] O2 Continue duonebs, inhalers, singulair Continue tapered dose of IV solumedrol Further management as above  OSA Continue CPAP  HTN Continue home losartan, HCT  Hypothyroidism Continue synthroid  Chronic back pain Continue Gabapentin, norco  Depression/insomnia  Continue Celexa, melatonin    Code Status: Full  Family Communication: None at bedside  Disposition Plan: SNF   Consultants:  PCCM  Procedures:  Mechanical ventilation on 11/05/17  Antimicrobials:  IV Rocephin  Azithromycin  DVT prophylaxis: Lovenox   Objective: Vitals:   11/10/17 0856 11/10/17 1145 11/10/17 1200 11/10/17 1342  BP: (!) 159/64  (!) 196/75   Pulse: 81  72   Resp: (!) 22  (!) 24   Temp:  97.6 F (36.4 C)    TempSrc:  Oral    SpO2: 96%  98% 96%  Weight:  107.6 kg (237 lb 3.4 oz)    Height:        Intake/Output Summary (Last 24 hours) at 11/10/2017 1435 Last data filed at 11/10/2017 1100 Gross per 24 hour  Intake 480 ml  Output 1300 ml  Net -820 ml   Filed Weights   11/05/17 0443 11/06/17 0400 11/10/17 1145  Weight: 107 kg (236 lb) 113.8 kg (250 lb 14.1 oz) 107.6 kg (237 lb 3.4 oz)    Exam:   General: Restless, NAD  Cardiovascular: S1, S2 present  Respiratory: Diminished breath sounds bilaterally  Abdomen: Soft, distended, nontender, bowel sounds present  Musculoskeletal: No pedal edema bilaterally  Skin: Normal  Psychiatry: Normal mood   Data Reviewed: CBC: Recent Labs  Lab 11/06/17 0259 11/07/17 0330 11/08/17 0253 11/09/17 0334 11/10/17 0322  WBC 31.7*  20.9* 16.6* 9.6 4.8  NEUTROABS 28.5* 18.5* 13.9* 6.3 3.8  HGB 12.5* 11.3* 11.0* 10.6* 11.2*  HCT 36.6* 33.0* 32.2* 30.3* 31.9*  MCV 93.6 94.0 93.6 93.5 91.7  PLT 262 216 199 215 283   Basic Metabolic Panel: Recent Labs  Lab 11/06/17 0259 11/07/17 0330 11/08/17 0253 11/09/17 0334 11/10/17 0322  NA 130* 135  137 137 135  K 4.5 3.9 3.8 4.0 3.3*  CL 102 104 105 102 97*  CO2 20* 22 22 26 26   GLUCOSE 98 98 104* 104* 152*  BUN 20 24* 20 19 20   CREATININE 1.17 0.89 0.86 0.77 0.86  CALCIUM 7.9* 8.2* 8.2* 8.3* 8.4*   GFR: Estimated Creatinine Clearance: 80.1 mL/min (by C-G formula based on SCr of 0.86 mg/dL). Liver Function Tests: Recent Labs  Lab 11/05/17 0504 11/06/17 0259 11/07/17 0330  AST 28 30 40  ALT 22 20 22   ALKPHOS 66 41 49  BILITOT 0.7 0.6 0.7  PROT 7.1 5.7* 5.9*  ALBUMIN 4.0 2.9* 3.0*   Recent Labs  Lab 11/05/17 0504  LIPASE 32   No results for input(s): AMMONIA in the last 168 hours. Coagulation Profile: No results for input(s): INR, PROTIME in the last 168 hours. Cardiac Enzymes: Recent Labs  Lab 11/05/17 0504 11/05/17 1130 11/05/17 2034 11/06/17 0259  TROPONINI <0.03 <0.03 <0.03 <0.03   BNP (last 3 results) No results for input(s): PROBNP in the last 8760 hours. HbA1C: No results for input(s): HGBA1C in the last 72 hours. CBG: Recent Labs  Lab 11/08/17 1523 11/08/17 1948 11/08/17 2328 11/09/17 0333 11/09/17 0749  GLUCAP 105* 131* 104* 101* 101*   Lipid Profile: No results for input(s): CHOL, HDL, LDLCALC, TRIG, CHOLHDL, LDLDIRECT in the last 72 hours. Thyroid Function Tests: No results for input(s): TSH, T4TOTAL, FREET4, T3FREE, THYROIDAB in the last 72 hours. Anemia Panel: No results for input(s): VITAMINB12, FOLATE, FERRITIN, TIBC, IRON, RETICCTPCT in the last 72 hours. Urine analysis:    Component Value Date/Time   COLORURINE YELLOW 11/05/2017 [place] 11/05/2017 0538   LABSPEC 1.006 11/05/2017 0538   PHURINE 7.0 11/05/2017 0538   GLUCOSEU NEGATIVE 11/05/2017 0538   HGBUR NEGATIVE 11/05/2017 0538   BILIRUBINUR NEGATIVE 11/05/2017 0538   KETONESUR NEGATIVE 11/05/2017 0538   PROTEINUR NEGATIVE 11/05/2017 0538   NITRITE NEGATIVE 11/05/2017 0538   LEUKOCYTESUR NEGATIVE 11/05/2017 0538   Sepsis  Labs: @LABRCNTIP (procalcitonin:4,lacticidven:4)  ) Recent Results (from the past 240 hour(s))  Urine culture     Status: None   Collection Time: 11/05/17  5:38 AM  Result Value Ref Range Status   Specimen Description   Final    URINE, RANDOM Performed at [facility], [address], [place], [place] [address]    Special Requests   Final    NONE Performed at [facility], [address], [place], [place] [address]    Culture   Final    NO GROWTH Performed at [facility], [place] [address], [place], [place] [address]    Report Status 11/06/2017 FINAL  Final  Blood Culture (routine x 2)     Status: None   Collection Time: 11/05/17  5:49 AM  Result Value Ref Range Status   Specimen Description BLOOD LEFT HAND  Final   Special Requests   Final    BOTTLES DRAWN AEROBIC ONLY Blood Culture results may not be optimal due to an inadequate volume of blood received in culture bottles   Culture   Final  NO GROWTH 5 DAYS Performed at [facility], [address], [place], [place] [address]    Report Status 11/10/2017 FINAL  Final  Blood Culture (routine x 2)     Status: None   Collection Time: 11/05/17  6:31 AM  Result Value Ref Range Status   Specimen Description BLOOD RIGHT HAND  Final   Special Requests   Final    ANAEROBIC BOTTLE ONLY Blood Culture results may not be optimal due to an inadequate volume of blood received in culture bottles   Culture   Final    NO GROWTH 5 DAYS Performed at [facility], [address] [place], [place] [address]    Report Status 11/10/2017 FINAL  Final  MRSA PCR Screening     Status: None   Collection Time: 11/05/17 12:44 PM  Result Value Ref Range Status   MRSA by PCR NEGATIVE NEGATIVE Final    Comment:        The GeneXpert MRSA Assay (FDA approved for NASAL specimens only), is one component of a comprehensive MRSA colonization surveillance program. It is not intended to diagnose MRSA infection nor to guide or monitor treatment  for MRSA infections. Performed at [facility], [place] [address] [place], [place] [address]   Gastrointestinal Panel by PCR , Stool     Status: None   Collection Time: 11/06/17  1:32 PM  Result Value Ref Range Status   Campylobacter species NOT DETECTED NOT DETECTED Final   Plesimonas shigelloides NOT DETECTED NOT DETECTED Final   Salmonella species NOT DETECTED NOT DETECTED Final   Yersinia enterocolitica NOT DETECTED NOT DETECTED Final   Vibrio species NOT DETECTED NOT DETECTED Final   Vibrio cholerae NOT DETECTED NOT DETECTED Final   Enteroaggregative E coli (EAEC) NOT DETECTED NOT DETECTED Final   Enteropathogenic E coli (EPEC) NOT DETECTED NOT DETECTED Final   Enterotoxigenic E coli (ETEC) NOT DETECTED NOT DETECTED Final   Shiga like toxin producing E coli (STEC) NOT DETECTED NOT DETECTED Final   Shigella/Enteroinvasive E coli (EIEC) NOT DETECTED NOT DETECTED Final   Cryptosporidium NOT DETECTED NOT DETECTED Final   Cyclospora cayetanensis NOT DETECTED NOT DETECTED Final   Entamoeba histolytica NOT DETECTED NOT DETECTED Final   Giardia lamblia NOT DETECTED NOT DETECTED Final   Adenovirus F40/41 NOT DETECTED NOT DETECTED Final   Astrovirus NOT DETECTED NOT DETECTED Final   Norovirus GI/GII NOT DETECTED NOT DETECTED Final   Rotavirus A NOT DETECTED NOT DETECTED Final   Sapovirus (I, II, IV, and V) NOT DETECTED NOT DETECTED Final    Comment: Performed at [facility], [address], [place], [place] [address]      Studies: No results found.  Scheduled Meds: . aspirin EC  81 mg Oral Daily  . azithromycin  500 mg Oral Daily  . citalopram  20 mg Oral Daily  . enoxaparin (LOVENOX) injection  40 mg Subcutaneous Q24H  . fluticasone  2 spray Each Nare Daily  . gabapentin  300 mg Oral q morning - 10a  . gabapentin  600 mg Oral QHS  . hydrochlorothiazide  25 mg Oral Daily  . ipratropium-albuterol  3 mL Nebulization TID  . levothyroxine  75 mcg Oral  QAC breakfast  . losartan  100 mg Oral Daily  . Melatonin  3 mg Oral QHS  . methylPREDNISolone (SOLU-MEDROL) injection  60 mg Intravenous Q8H  . mometasone-formoterol  2 puff Inhalation BID  . montelukast  10 mg Oral QHS  . multivitamin  with minerals  1 tablet Oral Daily  . pantoprazole  40 mg Oral Daily  . protein supplement shake  11 oz Oral Q24H    Continuous Infusions: . cefTRIAXone (ROCEPHIN)  IV Stopped (11/10/17 0515)     LOS: 5 days     Alma Friendly, MD [facility]   If 7PM-7AM, please contact night-coverage [email or web address] Password [facility] 11/10/2017, 2:35 PM

## 2017-11-11 ENCOUNTER — Inpatient Hospital Stay (HOSPITAL_COMMUNITY): Payer: Medicare Other

## 2017-11-11 LAB — BASIC METABOLIC PANEL
Anion gap: 11 (ref 5–15)
BUN: 25 mg/dL — AB (ref 6–20)
CO2: 29 mmol/L (ref 22–32)
CREATININE: 0.79 mg/dL (ref 0.61–1.24)
Calcium: 8.8 mg/dL — ABNORMAL LOW (ref 8.9–10.3)
Chloride: 96 mmol/L — ABNORMAL LOW (ref 101–111)
Glucose, Bld: 153 mg/dL — ABNORMAL HIGH (ref 65–99)
Potassium: 3.6 mmol/L (ref 3.5–5.1)
SODIUM: 136 mmol/L (ref 135–145)

## 2017-11-11 LAB — CBC WITH DIFFERENTIAL/PLATELET
BASOS ABS: 0 10*3/uL (ref 0.0–0.1)
BASOS PCT: 0 %
EOS ABS: 0 10*3/uL (ref 0.0–0.7)
Eosinophils Relative: 0 %
HCT: 32.7 % — ABNORMAL LOW (ref 39.0–52.0)
Hemoglobin: 11.2 g/dL — ABNORMAL LOW (ref 13.0–17.0)
LYMPHS PCT: 12 %
Lymphs Abs: 0.9 10*3/uL (ref 0.7–4.0)
MCH: 31.4 pg (ref 26.0–34.0)
MCHC: 34.3 g/dL (ref 30.0–36.0)
MCV: 91.6 fL (ref 78.0–100.0)
MONO ABS: 1.4 10*3/uL — AB (ref 0.1–1.0)
Monocytes Relative: 20 %
Neutro Abs: 4.9 10*3/uL (ref 1.7–7.7)
Neutrophils Relative %: 68 %
PLATELETS: 330 10*3/uL (ref 150–400)
RBC: 3.57 MIL/uL — ABNORMAL LOW (ref 4.22–5.81)
RDW: 13 % (ref 11.5–15.5)
WBC: 7.2 10*3/uL (ref 4.0–10.5)

## 2017-11-11 MED ORDER — FUROSEMIDE 10 MG/ML IJ SOLN
40.0000 mg | Freq: Once | INTRAMUSCULAR | Status: AC
  Administered 2017-11-11: 40 mg via INTRAVENOUS
  Filled 2017-11-11: qty 4

## 2017-11-11 NOTE — Progress Notes (Addendum)
PROGRESS NOTE  Cristian Yang TGG:[id number] DOB: [birth year] DOA: 11/05/2017 PCP: Rogue Bussing, MD  HPI/Recap of past 24 hours: 83 y/o male with PMH of COPD, OSA on CPAP, GERD, HTN, hypothyroidism, admitted 4/17 after being found down in the bathroom by his wife, confused with SOB, N/V, diarrhea noted PTA. Wife also noted some vomiting of which happens occasionally while pt is wearing his CPAP. Pt was admitted by the ICU team and intubated on 11/05/17 for airway protection and extubated on 11/06/17. Pt was managed for acute respiratory failure due to CAP and w/u for ?syncope. TRH was asked to take over care on 11/07/17.  Today, met pt once again sitting on the chair, looked better today, still dyspneic. Pt denies any chest pain, abdominal pain, N/V, fever/chills, diarrhea. Had a BM overnight   Assessment/Plan: Active Problems:   Acute respiratory failure (HCC)  Acute hypoxic respiratory failure 2/2 CAP & COPD exacerbation Still requiring O2, weaning off, now requiring 2L Afebrile, with resolved leukocytosis S/P mechanical ventilation, intubated on 4/17, extubated on 4/18 Strep pneumo negative, Legionella urine negative Pro-calcitonin 13.85-->7.74-->5.26 BC x 2, NGTD CXR with LUL & LLL PNA, repeat CXR on 4/23 with significant improvement Continue IV Rocephin, azithromycin for a total of 7 days, last dose on 11/11/17 Continued tapered dose of IV solumedrol, may change to PO on 11/12/17 Monitor closely  Septic shock likely due to CAP Resolved, alert Initially required Levophed, currently weaned off BP has remained stable/high Workup as above  Syncope Likely due to above, sepsis/hypoxia CT head negative Troponin x3-, EKG no acute ST changes  Acute distal colitis/proctitis Resolved CT abdomen showed mild acute sigmoid and rectum wall thickening with surrounding strandy edema/inflammation compatible with distal colitis/proctitis Stool GI panel: Negative Continue  antibiotics as above  GERD Worse, hx of vomiting in the CPAP as per wife, non since inpatient Unsure if compliant to PPI at home due to cost Continue PPI Spoke to GI, will see as an outpt  COPD  Continue [place] O2, plan to wean off, if unable, home O2 eval Continue duonebs, inhalers, singulair Continue tapered dose of IV solumedrol Further management as above  OSA Continue CPAP  HTN Continue home losartan, HCT  Hypothyroidism Continue synthroid  Chronic back pain Continue Gabapentin, norco  Depression/insomnia  Continue Celexa, melatonin    Code Status: Full  Family Communication: None at bedside  Disposition Plan: SNF   Consultants:  PCCM  Procedures:  Mechanical ventilation on 11/05/17  Antimicrobials:  IV Rocephin  Azithromycin  DVT prophylaxis: Lovenox   Objective: Vitals:   11/11/17 0850 11/11/17 0906 11/11/17 1145 11/11/17 1200  BP:      Pulse: 83  (!) 59   Resp: (!) 24  (!) 26   Temp:    98.2 F (36.8 C)  TempSrc:    Axillary  SpO2: 96% 95% 96%   Weight:      Height:        Intake/Output Summary (Last 24 hours) at 11/11/2017 1402 Last data filed at 11/11/2017 1145 Gross per 24 hour  Intake -  Output 950 ml  Net -950 ml   Filed Weights   11/05/17 0443 11/06/17 0400 11/10/17 1145  Weight: 107 kg (236 lb) 113.8 kg (250 lb 14.1 oz) 107.6 kg (237 lb 3.4 oz)    Exam:   General: NAD  Cardiovascular: S1, S2 present  Respiratory: Diminished breath sounds bilaterally, mild wheezing noted  Abdomen: Soft, obese, nontender, bowel sounds present  Musculoskeletal: No pedal  edema bilaterally  Skin: Normal  Psychiatry: Normal mood   Data Reviewed: CBC: Recent Labs  Lab 11/07/17 0330 11/08/17 0253 11/09/17 0334 11/10/17 0322 11/11/17 0311  WBC 20.9* 16.6* 9.6 4.8 7.2  NEUTROABS 18.5* 13.9* 6.3 3.8 4.9  HGB 11.3* 11.0* 10.6* 11.2* 11.2*  HCT 33.0* 32.2* 30.3* 31.9* 32.7*  MCV 94.0 93.6 93.5 91.7 91.6  PLT 216 199 215 258  174   Basic Metabolic Panel: Recent Labs  Lab 11/07/17 0330 11/08/17 0253 11/09/17 0334 11/10/17 0322 11/11/17 0311  NA 135 137 137 135 136  K 3.9 3.8 4.0 3.3* 3.6  CL 104 105 102 97* 96*  CO2 _0 GLUCOSE 98 104* 104* 152* 153*  BUN 24* _1 25*  CREATININE 0.89 0.86 0.77 0.86 0.79  CALCIUM 8.2* 8.2* 8.3* 8.4* 8.8*   GFR: Estimated Creatinine Clearance: 86.1 mL/min (by C-G formula based on SCr of 0.79 mg/dL). Liver Function Tests: Recent Labs  Lab 11/05/17 0504 11/06/17 0259 11/07/17 0330  AST 28 30 40  ALT _2 ALKPHOS 66 41 49  BILITOT 0.7 0.6 0.7  PROT 7.1 5.7* 5.9*  ALBUMIN 4.0 2.9* 3.0*   Recent Labs  Lab 11/05/17 0504  LIPASE 32   No results for input(s): AMMONIA in the last 168 hours. Coagulation Profile: No results for input(s): INR, PROTIME in the last 168 hours. Cardiac Enzymes: Recent Labs  Lab 11/05/17 0504 11/05/17 1130 11/05/17 2034 11/06/17 0259  TROPONINI <0.03 <0.03 <0.03 <0.03   BNP (last 3 results) No results for input(s): PROBNP in the last 8760 hours. HbA1C: No results for input(s): HGBA1C in the last 72 hours. CBG: Recent Labs  Lab 11/08/17 1523 11/08/17 1948 11/08/17 2328 11/09/17 0333 11/09/17 0749  GLUCAP 105* 131* 104* 101* 101*   Lipid Profile: No results for input(s): CHOL, HDL, LDLCALC, TRIG, CHOLHDL, LDLDIRECT in the last 72 hours. Thyroid Function Tests: No results for input(s): TSH, T4TOTAL, FREET4, T3FREE, THYROIDAB in the last 72 hours. Anemia Panel: No results for input(s): VITAMINB12, FOLATE, FERRITIN, TIBC, IRON, RETICCTPCT in the last 72 hours. Urine analysis:    Component Value Date/Time   COLORURINE YELLOW 11/05/2017 [place] 11/05/2017 0538   LABSPEC 1.006 11/05/2017 0538   PHURINE 7.0 11/05/2017 0538   GLUCOSEU NEGATIVE 11/05/2017 0538   HGBUR NEGATIVE 11/05/2017 0538   BILIRUBINUR NEGATIVE 11/05/2017 0538   KETONESUR NEGATIVE 11/05/2017 0538   PROTEINUR  NEGATIVE 11/05/2017 0538   NITRITE NEGATIVE 11/05/2017 0538   LEUKOCYTESUR NEGATIVE 11/05/2017 0538   Sepsis Labs: _3 (procalcitonin:4,lacticidven:4)  ) Recent Results (from the past 240 hour(s))  Urine culture     Status: None   Collection Time: 11/05/17  5:38 AM  Result Value Ref Range Status   Specimen Description   Final    URINE, RANDOM Performed at [facility], [address], [place], [place] [address]    Special Requests   Final    NONE Performed at [facility], [address], [place], [place] [address]    Culture   Final    NO GROWTH Performed at [facility], [place] [address] [place],  [address]    Report Status 11/06/2017 FINAL  Final  Blood Culture (routine x 2)     Status: None   Collection Time: 11/05/17  5:49 AM  Result Value Ref Range Status   Specimen Description BLOOD LEFT HAND  Final   Special Requests   Final  BOTTLES DRAWN AEROBIC ONLY Blood Culture results may not be optimal due to an inadequate volume of blood received in culture bottles   Culture   Final    NO GROWTH 5 DAYS Performed at [facility], [address], [place], [place] [address]    Report Status 11/10/2017 FINAL  Final  Blood Culture (routine x 2)     Status: None   Collection Time: 11/05/17  6:31 AM  Result Value Ref Range Status   Specimen Description BLOOD RIGHT HAND  Final   Special Requests   Final    ANAEROBIC BOTTLE ONLY Blood Culture results may not be optimal due to an inadequate volume of blood received in culture bottles   Culture   Final    NO GROWTH 5 DAYS Performed at [facility], [address] [place], [place] [address]    Report Status 11/10/2017 FINAL  Final  MRSA PCR Screening     Status: None   Collection Time: 11/05/17 12:44 PM  Result Value Ref Range Status   MRSA by PCR NEGATIVE NEGATIVE Final    Comment:        The GeneXpert MRSA Assay (FDA approved for NASAL specimens only), is one component of a comprehensive MRSA  colonization surveillance program. It is not intended to diagnose MRSA infection nor to guide or monitor treatment for MRSA infections. Performed at [facility], [place] [address] [place], [place] [address]   Gastrointestinal Panel by PCR , Stool     Status: None   Collection Time: 11/06/17  1:32 PM  Result Value Ref Range Status   Campylobacter species NOT DETECTED NOT DETECTED Final   Plesimonas shigelloides NOT DETECTED NOT DETECTED Final   Salmonella species NOT DETECTED NOT DETECTED Final   Yersinia enterocolitica NOT DETECTED NOT DETECTED Final   Vibrio species NOT DETECTED NOT DETECTED Final   Vibrio cholerae NOT DETECTED NOT DETECTED Final   Enteroaggregative E coli (EAEC) NOT DETECTED NOT DETECTED Final   Enteropathogenic E coli (EPEC) NOT DETECTED NOT DETECTED Final   Enterotoxigenic E coli (ETEC) NOT DETECTED NOT DETECTED Final   Shiga like toxin producing E coli (STEC) NOT DETECTED NOT DETECTED Final   Shigella/Enteroinvasive E coli (EIEC) NOT DETECTED NOT DETECTED Final   Cryptosporidium NOT DETECTED NOT DETECTED Final   Cyclospora cayetanensis NOT DETECTED NOT DETECTED Final   Entamoeba histolytica NOT DETECTED NOT DETECTED Final   Giardia lamblia NOT DETECTED NOT DETECTED Final   Adenovirus F40/41 NOT DETECTED NOT DETECTED Final   Astrovirus NOT DETECTED NOT DETECTED Final   Norovirus GI/GII NOT DETECTED NOT DETECTED Final   Rotavirus A NOT DETECTED NOT DETECTED Final   Sapovirus (I, II, IV, and V) NOT DETECTED NOT DETECTED Final    Comment: Performed at [facility], [address], [place], [place] [address]      Studies: Dg Chest Port 1 View  Result Date: 11/11/2017 CLINICAL DATA:  Shortness of breath. EXAM: PORTABLE CHEST 1 VIEW COMPARISON:  Radiograph of November 09, 2017. FINDINGS: Stable cardiomediastinal silhouette. No pneumothorax or pleural effusion is noted. Mild scarring is noted in right lung base. Left-sided pneumonia noted  on prior exam is significantly improved. Bony thorax is unremarkable. IMPRESSION: Significantly improved left-sided pneumonia compared to prior exam. Electronically Signed   By: Marijo Conception, M.D.   On: 11/11/2017 09:21    Scheduled Meds: . aspirin EC  81 mg Oral Daily  . azithromycin  500 mg Oral Daily  . citalopram  20 mg Oral Daily  . enoxaparin (LOVENOX) injection  40 mg Subcutaneous Q24H  . fluticasone  2 spray Each Nare Daily  . gabapentin  300 mg Oral q morning - 10a  . gabapentin  600 mg Oral QHS  . hydrochlorothiazide  25 mg Oral Daily  . ipratropium-albuterol  3 mL Nebulization TID  . levothyroxine  75 mcg Oral QAC breakfast  . losartan  100 mg Oral Daily  . Melatonin  3 mg Oral QHS  . methylPREDNISolone (SOLU-MEDROL) injection  40 mg Intravenous Q12H  . mometasone-formoterol  2 puff Inhalation BID  . montelukast  10 mg Oral QHS  . multivitamin with minerals  1 tablet Oral Daily  . pantoprazole  40 mg Oral Daily  . protein supplement shake  11 oz Oral Q24H    Continuous Infusions: . cefTRIAXone (ROCEPHIN)  IV 2 g (11/11/17 0529)     LOS: 6 days     Alma Friendly, MD [facility]   If 7PM-7AM, please contact night-coverage [email or web address] Password [facility] 11/11/2017, 2:02 PM

## 2017-11-11 NOTE — Clinical Social Work Note (Signed)
Clinical Social Work Assessment  Patient Details  Name: Cristian Yang MRN: [id number] Date of Birth: July 24, 1934  Date of referral:  11/11/17               Reason for consult:  Discharge Planning                Permission sought to share information with:  Facility Art therapist granted to share information::  Yes, Verbal Permission Granted  Name::        Agency::     Relationship::  Spouse    Contact Information:     Housing/Transportation Living arrangements for the past 2 months:  Mobile Home Source of Information:  Patient Patient Interpreter Needed:  None Criminal Activity/Legal Involvement Pertinent to Current Situation/Hospitalization:  No - Comment as needed Significant Relationships:  Spouse Lives with:  Spouse Do you feel safe going back to the place where you live?  Yes Need for family participation in patient care:  Yes (dependent with mobility)  Care giving concerns:   SNF placement.   Social Worker assessment / plan:  CSW met with patient at bedside. The patient sitting up in chair and agreeable to discuss his discharge plan to SNF.  Patient reports he is not interested in going to SNF at this time because there will not be anyone at home to care for his spouse. He reports his spouse has been managing home alone but cannot continue to stay alone. He reports his siblings live out of town and his spouse's siblings are unreliable. They do not have children.   Patient reports he prefers to have home health services in the home. Patient reports he is independent with all activities of daily living and drives.  Patient states he has been walking with physical therapy and feels he is progressing well.   Plan: Home Health   Employment status:  Retired Forensic scientist:  Medicare PT Recommendations:  [place] / Referral to community resources:     Patient/Family's Response to care:  Agreeable and Responding well to  care.   Patient/Family's Understanding of and Emotional Response to Diagnosis, Current Treatment, and Prognosis:  Patient has a good understanding of his care and diagnosis.  Emotional Assessment Appearance:  Appears stated age Attitude/Demeanor/Rapport:    Affect (typically observed):  Accepting, Pleasant Orientation:  Oriented to Self, Oriented to Place, Oriented to  Time, Oriented to Situation Alcohol / Substance use:  Not Applicable Psych involvement (Current and /or in the community):  No (Comment)  Discharge Needs  Concerns to be addressed:  Discharge Planning Concerns Readmission within the last 30 days:  No Current discharge risk:  Dependent with Mobility Barriers to Discharge:  Continued Medical Work up   [employer], LCSW 11/11/2017, 11:48 AM

## 2017-11-11 NOTE — Progress Notes (Signed)
Occupational Therapy Treatment Patient Details Name: Cristian Yang MRN: [id number] DOB: 14-Jun-1935 Today's Date: 11/11/2017    History of present illness 83 yo male found down with SOB, N/V, diarrhea, intubated 11/05/17 extubated 11/06/17. Pt being treated for acute respiratory failure due to CAP / septic shock PMH: COPD, OSA, CPAP, GERD, HTN, hypothyroidism    OT comments  Pt very motivated.  He really wants to walk to recover strength to go home.  He does most of the IADls at home and is the only driver.  Will continue to follow in acute setting.   Follow Up Recommendations  SNF prefers home, but decreased endurance/activity tolerance   Equipment Recommendations  (pt has shower seat and 3:1 commode)    Recommendations for Other Services      Precautions / Restrictions Precautions Precautions: Fall Precaution Comments: monitor O2 Restrictions Weight Bearing Restrictions: No       Mobility Bed Mobility               General bed mobility comments: oob  Transfers   Equipment used: Rolling walker (2 wheeled)   Sit to Stand: Min guard         General transfer comment: for safety    Balance                                           ADL either performed or assessed with clinical judgement   ADL       Grooming: Supervision/safety;Standing                   Toilet Transfer: Ambulation;RW;Minimal assistance(steadying assistance)             General ADL Comments: brought energy conservation sheets and educated pt on these.  Ambulated around room twice with sitting rest breaks in preparation for gathering clothing items. Cues for rest breaks and pursed lip breathin     Vision       Perception     Praxis      Cognition Arousal/Alertness: Awake/alert Behavior During Therapy: WFL for tasks assessed/performed Overall Cognitive Status: Within Functional Limits for tasks assessed                                  General Comments: talks excessively, requires cues to stop talking and breathe during mobility        Exercises     Shoulder Instructions       General Comments 02 90-99% on 2 liters     Pertinent Vitals/ Pain       Pain Assessment: No/denies pain  Home Living                                          Prior Functioning/Environment              Frequency           Progress Toward Goals  OT Goals(current goals can now be found in the care plan section)  Progress towards OT goals: Progressing toward goals     Plan      Co-evaluation                 AM-PAC PT "6 Clicks"  Daily Activity     Outcome Measure   Help from another person eating meals?: None Help from another person taking care of personal grooming?: A Little Help from another person toileting, which includes using toliet, bedpan, or urinal?: A Little Help from another person bathing (including washing, rinsing, drying)?: A Little Help from another person to put on and taking off regular upper body clothing?: A Little Help from another person to put on and taking off regular lower body clothing?: A Little 6 Click Score: 19    End of Session    OT Visit Diagnosis: Unsteadiness on feet (R26.81);Muscle weakness (generalized) (M62.81)   Activity Tolerance Patient tolerated treatment well   Patient Left in chair;with call bell/phone within reach;with chair alarm set   Nurse Communication          Time: 8016-5537 OT Time Calculation (min): 26 min  Charges: OT General Charges $OT Visit: 1 Visit OT Treatments $Therapeutic Activity: 23-37 mins  Cristian Yang, OTR/L [phone] 11/11/2017   Cristian Yang 11/11/2017, 11:25 AM

## 2017-11-11 NOTE — Plan of Care (Signed)
  Problem: Nutrition: Goal: Adequate nutrition will be maintained Outcome: Progressing   Problem: Elimination: Goal: Will not experience complications related to bowel motility Outcome: Progressing   Problem: Activity: Goal: Risk for activity intolerance will decrease Outcome: Progressing   

## 2017-11-12 ENCOUNTER — Other Ambulatory Visit: Payer: Self-pay

## 2017-11-12 DIAGNOSIS — J440 Chronic obstructive pulmonary disease with acute lower respiratory infection: Secondary | ICD-10-CM

## 2017-11-12 DIAGNOSIS — I959 Hypotension, unspecified: Secondary | ICD-10-CM

## 2017-11-12 DIAGNOSIS — J209 Acute bronchitis, unspecified: Secondary | ICD-10-CM

## 2017-11-12 DIAGNOSIS — E039 Hypothyroidism, unspecified: Secondary | ICD-10-CM

## 2017-11-12 DIAGNOSIS — J181 Lobar pneumonia, unspecified organism: Secondary | ICD-10-CM

## 2017-11-12 LAB — BASIC METABOLIC PANEL
Anion gap: 12 (ref 5–15)
BUN: 24 mg/dL — ABNORMAL HIGH (ref 6–20)
CALCIUM: 8.9 mg/dL (ref 8.9–10.3)
CO2: 28 mmol/L (ref 22–32)
CREATININE: 0.71 mg/dL (ref 0.61–1.24)
Chloride: 96 mmol/L — ABNORMAL LOW (ref 101–111)
GFR calc non Af Amer: >60 mL/min (ref >60)
Glucose, Bld: 90 mg/dL (ref 65–99)
Potassium: 3.4 mmol/L — ABNORMAL LOW (ref 3.5–5.1)
Sodium: 136 mmol/L (ref 135–145)

## 2017-11-12 LAB — CBC WITH DIFFERENTIAL/PLATELET
BASOS ABS: 0 10*3/uL (ref 0.0–0.1)
Basophils Relative: 0 %
EOS ABS: 0 10*3/uL (ref 0.0–0.7)
Eosinophils Relative: 0 %
HCT: 37.1 % — ABNORMAL LOW (ref 39.0–52.0)
Hemoglobin: 12.6 g/dL — ABNORMAL LOW (ref 13.0–17.0)
LYMPHS ABS: 1.9 10*3/uL (ref 0.7–4.0)
Lymphocytes Relative: 23 %
MCH: 31.3 pg (ref 26.0–34.0)
MCHC: 34 g/dL (ref 30.0–36.0)
MCV: 92.3 fL (ref 78.0–100.0)
MONO ABS: 2.3 10*3/uL — AB (ref 0.1–1.0)
MONOS PCT: 28 %
NEUTROS ABS: 3.9 10*3/uL (ref 1.7–7.7)
Neutrophils Relative %: 49 %
PLATELETS: 419 10*3/uL — AB (ref 150–400)
RBC: 4.02 MIL/uL — ABNORMAL LOW (ref 4.22–5.81)
RDW: 13.2 % (ref 11.5–15.5)
WBC: 8.1 10*3/uL (ref 4.0–10.5)

## 2017-11-12 MED ORDER — POTASSIUM CHLORIDE CRYS ER 20 MEQ PO TBCR
40.0000 meq | EXTENDED_RELEASE_TABLET | Freq: Once | ORAL | Status: AC
  Administered 2017-11-12: 40 meq via ORAL
  Filled 2017-11-12: qty 2

## 2017-11-12 NOTE — Progress Notes (Signed)
PROGRESS NOTE    Cristian Yang  OZD:[id number] DOB: Nov 05, 1934 DOA: 11/05/2017 PCP: Rogue Bussing, MD    Brief Narrative:  83 year old male who presented with dyspnea.  Patient does have significant past medical history of COPD, apparently patient was found down in the bathroom with a severe dyspnea.  Patient was found in severe respiratory distress, required intubation and mechanical ventilation.  Initial physical examination on mechanical ventilation, pressure 88/55, heart rate 56, temperature 98.4, respirate 18, oxygen saturation 98%, (PRVC, FiO2 80%, rate 18, tidal volume 550, PEEP 5, plateau pressure 30).  He was sedated, his lungs had diffuse rhonchi bilaterally, heart S1-S2 present in the rhythmic, abdomen was distended and tympanic, no lower extremity edema.  Arterial blood gas, 7.31/42.6/ 63.8/ 20.3/ 88% (on BiPAP).  126, potassium 3.8, chloride 88, bicarb 25, glucose 139, BUN 14, creatinine 1.27, white count 16.9, hemoglobin 14.9, hematocrit 43.9, platelets 289, urinalysis negative for infection.  Head CT with no acute intracranial normalities, CT of the abdomen with mild acute sigmoid and rectum wall thickening with surrounding strandy edema, inflammation compatible with distal colitis/proctitis.  Chest x-ray, hyperinflated, left lower lobe alveolar infiltrate.  EKG sinus rhythm, left axis deviation, right bundle branch block.  Patient was admitted to the intensive care unit with a working diagnosis of septic shock due to left lower lobe pneumonia complicated by respiratory failure and acute kidney injury.  Assessment & Plan:   Active Problems:   Acute respiratory failure ([place])   1.  Septic shock due to left lower lobe pneumonia. Clinically patient with improved hemodynamics, dyspnea continue to improve. Has remained afebrile, wbc at  8.1. Cultures have been no growth. Completed antibiotic therapy. Follow up chest film personally reviewed noted improvement on the infiltrates.    2.  Acute hypoxic respiratory failure with COPD exacerbation. Continue supplemental 02 per [place], keep 02 saturation above 92%, will need home 02 screen before discharge. Will continue aggressive bronchodilator therapy and taper systemic steroids.   3.  Acute kidney injury with hypokalemia. Improved renal function, will continue to avoid nephrotoxic medications or hypotension. Continue k correction with kcl, follow renal panel in am.   4.  Colitis. Clinically has resolved, patient tolerating po well, no abdominal pain, no nausea or vomiting.   5.  Hypertension. Continue blood pressure control with hctz, losartan.   6. Hypothyroid. Continue levothyroxine.   7. Depression. Continue celexa.   DVT prophylaxis: enoxaparin   Code Status: full Family Communication: no family at the bedside Disposition Plan: home with home health in 24 hours   Consultants:     Procedures:     Antimicrobials:       Subjective: Patient feeling better, improving dyspnea, not back to baseline, no nausea or vomiting, has been out of bed with physical therapy. Not on supplemental 02 at home.   Objective: Vitals:   11/11/17 2317 11/12/17 0125 11/12/17 0655 11/12/17 0833  BP:  (!) 153/85 (!) 183/88   Pulse:  74 61   Resp:  16 14   Temp: 98.3 F (36.8 C) 97.7 F (36.5 C) 98 F (36.7 C)   TempSrc: Oral Oral Oral   SpO2:  93% 94% 94%  Weight:  109.5 kg (241 lb 6.5 oz)    Height:  5\' 8"  (1.727 m)      Intake/Output Summary (Last 24 hours) at 11/12/2017 0840 Last data filed at 11/12/2017 0733 Gross per 24 hour  Intake 1060 ml  Output 2050 ml  Net -990 ml  Filed Weights   11/06/17 0400 11/10/17 1145 11/12/17 0125  Weight: 113.8 kg (250 lb 14.1 oz) 107.6 kg (237 lb 3.4 oz) 109.5 kg (241 lb 6.5 oz)    Examination:   General: deconditioned  Neurology: Awake and alert, non focal  E ENT: mild pallor, no icterus, oral mucosa moist Cardiovascular: No JVD. S1-S2 present, rhythmic, no gallops,  rubs, or murmurs. Positive ++ pitting lower extremity edema. Pulmonary: decreased breath sounds bilaterally, poor air movement, positive bilateral wheezing and rhonchi, predominantly at bases Gastrointestinal. Abdomen protuberant with no organomegaly, non tender, no rebound or guarding Skin. No rashes Musculoskeletal: no joint deformities     Data Reviewed: I have personally reviewed following labs and imaging studies  CBC: Recent Labs  Lab 11/08/17 0253 11/09/17 0334 11/10/17 0322 11/11/17 0311 11/12/17 0722  WBC 16.6* 9.6 4.8 7.2 8.1  NEUTROABS 13.9* 6.3 3.8 4.9 PENDING  HGB 11.0* 10.6* 11.2* 11.2* 12.6*  HCT 32.2* 30.3* 31.9* 32.7* 37.1*  MCV 93.6 93.5 91.7 91.6 92.3  PLT 199 215 258 330 323*   Basic Metabolic Panel: Recent Labs  Lab 11/08/17 0253 11/09/17 0334 11/10/17 0322 11/11/17 0311 11/12/17 0722  NA 137 137 135 136 136  K 3.8 4.0 3.3* 3.6 3.4*  CL 105 102 97* 96* 96*  CO2 22 26 26 29 28   GLUCOSE 104* 104* 152* 153* 90  BUN 20 19 20  25* 24*  CREATININE 0.86 0.77 0.86 0.79 0.71  CALCIUM 8.2* 8.3* 8.4* 8.8* 8.9   GFR: Estimated Creatinine Clearance: 86.9 mL/min (by C-G formula based on SCr of 0.71 mg/dL). Liver Function Tests: Recent Labs  Lab 11/06/17 0259 11/07/17 0330  AST 30 40  ALT 20 22  ALKPHOS 41 49  BILITOT 0.6 0.7  PROT 5.7* 5.9*  ALBUMIN 2.9* 3.0*   No results for input(s): LIPASE, AMYLASE in the last 168 hours. No results for input(s): AMMONIA in the last 168 hours. Coagulation Profile: No results for input(s): INR, PROTIME in the last 168 hours. Cardiac Enzymes: Recent Labs  Lab 11/05/17 1130 11/05/17 2034 11/06/17 0259  TROPONINI <0.03 <0.03 <0.03   BNP (last 3 results) No results for input(s): PROBNP in the last 8760 hours. HbA1C: No results for input(s): HGBA1C in the last 72 hours. CBG: Recent Labs  Lab 11/08/17 1523 11/08/17 1948 11/08/17 2328 11/09/17 0333 11/09/17 0749  GLUCAP 105* 131* 104* 101* 101*    Lipid Profile: No results for input(s): CHOL, HDL, LDLCALC, TRIG, CHOLHDL, LDLDIRECT in the last 72 hours. Thyroid Function Tests: No results for input(s): TSH, T4TOTAL, FREET4, T3FREE, THYROIDAB in the last 72 hours. Anemia Panel: No results for input(s): VITAMINB12, FOLATE, FERRITIN, TIBC, IRON, RETICCTPCT in the last 72 hours.    Radiology Studies: I have reviewed all of the imaging during this hospital visit personally     Scheduled Meds: . aspirin EC  81 mg Oral Daily  . azithromycin  500 mg Oral Daily  . citalopram  20 mg Oral Daily  . enoxaparin (LOVENOX) injection  40 mg Subcutaneous Q24H  . fluticasone  2 spray Each Nare Daily  . gabapentin  300 mg Oral q morning - 10a  . gabapentin  600 mg Oral QHS  . hydrochlorothiazide  25 mg Oral Daily  . ipratropium-albuterol  3 mL Nebulization TID  . levothyroxine  75 mcg Oral QAC breakfast  . losartan  100 mg Oral Daily  . Melatonin  3 mg Oral QHS  . methylPREDNISolone (SOLU-MEDROL) injection  40 mg Intravenous Q12H  .  mometasone-formoterol  2 puff Inhalation BID  . montelukast  10 mg Oral QHS  . multivitamin with minerals  1 tablet Oral Daily  . pantoprazole  40 mg Oral Daily  . protein supplement shake  11 oz Oral Q24H   Continuous Infusions: . cefTRIAXone (ROCEPHIN)  IV Stopped (11/12/17 0715)     LOS: 7 days        Mauricio Gerome Apley, MD [facility] Pager [phone]

## 2017-11-12 NOTE — Progress Notes (Addendum)
Patient agreeable to receive additional Home Health support. CSW referred patient to [facility]. The liaison Georgina Snell will follow up.   Kathrin Greathouse, Latanya Presser, MSW Clinical Social Worker  [phone] 11/12/2017  11:19 AM

## 2017-11-12 NOTE — Progress Notes (Signed)
Occupational Therapy Treatment Patient Details Name: Cristian Yang MRN: [id number] DOB: [birth year] Today's Date: 11/12/2017    History of present illness 83 yo male found down with SOB, N/V, diarrhea, intubated 11/05/17 extubated 11/06/17. Pt being treated for acute respiratory failure due to CAP / septic shock PMH: COPD, OSA, CPAP, GERD, HTN, hypothyroidism    OT comments  Ambulated in room to sink for ADL.  Pt with decreased endurance; fatiques quickly. Still does not agree to SNF as he wants to get home to his wife, whom he helps.  Pt does not have a good support system at home.     Follow Up Recommendations  SNF(if pt refuses, home health OT)    Equipment Recommendations    none by OT   Recommendations for Other Services      Precautions / Restrictions Precautions Precautions: Fall Precaution Comments: monitor O2 Restrictions Weight Bearing Restrictions: No       Mobility Bed Mobility Overal bed mobility: Modified Independent             General bed mobility comments: hob raised  Transfers   Equipment used: Rolling walker (2 wheeled)   Sit to Stand: Supervision              Balance                                           ADL either performed or assessed with clinical judgement   ADL           Upper Body Bathing: Supervision/ safety;Set up;Sitting   Lower Body Bathing: Minimal assistance;Sit to/from stand           Toilet Transfer: Min guard;Ambulation;BSC;RW             General ADL Comments: ambulated about 10 feet to sink for adl.  Pt fatiques easily. Dyspnea 2/4; sats 91-95% on 2 liters. Cues for rest breaks and to concentrate on breathing     Vision       Perception     Praxis      Cognition Arousal/Alertness: Awake/alert Behavior During Therapy: WFL for tasks assessed/performed Overall Cognitive Status: Within Functional Limits for tasks assessed                                  General Comments: talks excessively, requires cues to stop talking and breathe during mobility        Exercises     Shoulder Instructions       General Comments      Pertinent Vitals/ Pain       Pain Assessment: No/denies pain  Home Living                                          Prior Functioning/Environment              Frequency           Progress Toward Goals  OT Goals(current goals can now be found in the care plan section)  Progress towards OT goals: Progressing toward goals     Plan      Co-evaluation  AM-PAC PT "6 Clicks" Daily Activity     Outcome Measure   Help from another person eating meals?: None Help from another person taking care of personal grooming?: A Little Help from another person toileting, which includes using toliet, bedpan, or urinal?: A Little Help from another person bathing (including washing, rinsing, drying)?: A Little Help from another person to put on and taking off regular upper body clothing?: A Little Help from another person to put on and taking off regular lower body clothing?: A Lot 6 Click Score: 18    End of Session    OT Visit Diagnosis: Unsteadiness on feet (R26.81);Muscle weakness (generalized) (M62.81)   Activity Tolerance Patient limited by fatigue   Patient Left in bed;with call bell/phone within reach   Nurse Communication          Time: 2831-5176 OT Time Calculation (min): 25 min  Charges: OT General Charges $OT Visit: 1 Visit OT Treatments $Self Care/Home Management : 23-37 mins  Lesle Chris, OTR/L [phone] 11/12/2017   Cristian Yang 11/12/2017, 9:35 AM

## 2017-11-13 ENCOUNTER — Telehealth: Payer: Self-pay

## 2017-11-13 ENCOUNTER — Other Ambulatory Visit: Payer: Self-pay

## 2017-11-13 LAB — BASIC METABOLIC PANEL
Anion gap: 10 (ref 5–15)
BUN: 22 mg/dL — AB (ref 6–20)
CO2: 29 mmol/L (ref 22–32)
Calcium: 8.5 mg/dL — ABNORMAL LOW (ref 8.9–10.3)
Chloride: 95 mmol/L — ABNORMAL LOW (ref 101–111)
Creatinine, Ser: 0.77 mg/dL (ref 0.61–1.24)
GFR calc Af Amer: >60 mL/min (ref >60)
Glucose, Bld: 114 mg/dL — ABNORMAL HIGH (ref 65–99)
POTASSIUM: 4 mmol/L (ref 3.5–5.1)
SODIUM: 134 mmol/L — AB (ref 135–145)

## 2017-11-13 MED ORDER — GABAPENTIN 300 MG PO CAPS: ORAL_CAPSULE | ORAL | 0 refills | Status: DC

## 2017-11-13 MED ORDER — PREMIER PROTEIN SHAKE: 11.0000 [oz_av] | ORAL | 0 refills | Status: AC

## 2017-11-13 MED ORDER — PREDNISONE 20 MG PO TABS: 20.0000 mg | ORAL_TABLET | Freq: Every day | ORAL | 0 refills | Status: AC

## 2017-11-13 MED ORDER — LOSARTAN POTASSIUM 100 MG PO TABS: 100.0000 mg | ORAL_TABLET | Freq: Every day | ORAL | 0 refills | Status: DC

## 2017-11-13 NOTE — Progress Notes (Signed)
Pt's family member called to inquire about med list for home. RN reviewed AVS med list with family member. RN also instructed family member that pt will need to follow up with primary care physician as directed by discharging physician. Stacey Drain

## 2017-11-13 NOTE — Care Management Note (Signed)
Case Management Note  Patient Details  Name: Cristian Yang MRN: [id number] Date of Birth: [birth year]  Subjective/Objective: [name] [facility] aware of d/c. Awaiting HHC orders-MD notified. Patient already has 3n1,& rollator-Patient agrees to d/c plan. No dme needs. No further CM needs.                   Action/Plan:d/c home w/HHC.   Expected Discharge Date:  (unknown)               Expected Discharge Plan:  [place]  In-House Referral:     Discharge planning Services  CM Consult  Post Acute Care Choice:  Durable Medical Equipment(rollator, 3n1) Choice offered to:  Patient  DME Arranged:    DME Agency:     HH Arranged:  RN, PT, OT, Nurse's Aide, Social Work [employer] Agency:  [place]  Status of Service:  Completed, signed off  If discussed at [employer] of Stay Meetings, dates discussed:    Additional Comments:  Dessa Phi, RN 11/13/2017, 10:29 AM

## 2017-11-13 NOTE — Progress Notes (Signed)
Noted home 02 ordered-awaiting 02 sats documentation to process for home 02 for [place] notified.

## 2017-11-13 NOTE — Discharge Summary (Addendum)
Physician Discharge Summary  Cristian Yang WUJ:[id number] DOB: [birth year] DOA: 11/05/2017  PCP: Rogue Bussing, MD  Admit date: 11/05/2017 Discharge date: 11/13/2017  Admitted From: Home Disposition:  Home   Recommendations for Outpatient Follow-up and new medication changes:  1. Follow up with PCP in 1- week 2. Continue taper prednisone 3. Supplemental home 02 (3 LPM) continuously. Patient desaturated to 86% on room air.    Home Health: ys  Equipment/Devices: Supplemental home 02   Discharge Condition: stable  CODE STATUS: full  Diet recommendation: Heart healthy   Brief/Interim Summary: 83 year old male who presented with dyspnea.  Patient does have the significant past medical history of COPD, apparently patient was found down in the bathroom with a severe dyspnea.  Patient was found in severe respiratory distress, failed non invasive mechanical ventilation and required intubation and mechanical ventilation.  Initial physical examination on mechanical ventilation, blood pressure 88/55, heart rate 56, temperature 98.4, respirate rate 18, oxygen saturation 98%, (PRVC, FiO2 80%, rate 18, tidal volume 550, PEEP 5, plateau pressure 30).  He was sedated, his lungs had diffuse rhonchi bilaterally, heart S1-S2 present and rhythmic, abdomen was distended and tympanic, no lower extremity edema.  Arterial blood gas, 7.31/42.6/ 63.8/ 20.3/ 88% (on BiPAP). Sodium 126, potassium 3.8, chloride 88, bicarb 25, glucose 139, BUN 14, creatinine 1.27, white count 16.9, hemoglobin 14.9, hematocrit 43.9, platelets 289, urinalysis negative for infection.  Head CT with no acute intracranial normalities, CT of the abdomen with mild acute sigmoid and rectum wall thickening with surrounding strandy edema, inflammation compatible with distal colitis/proctitis.  Chest x-ray, hyperinflated, left lower lobe alveolar infiltrates.  EKG sinus rhythm, left axis deviation, right bundle branch block.  Patient was  admitted to the intensive care unit with a working diagnosis of septic shock due to left lower lobe pneumonia complicated by respiratory failure and acute kidney injury.  1.  Septic shock due to left lower lobe pneumonia, present on admission.  Patient was admitted to the intensive care unit, he received broad-spectrum IV antibiotic therapy, he required vasopressors.  He responded well to the medical therapy, vasopressors were successfully discontinued.  Patient completed antibiotic therapy in the hospital.  His cultures remain no growth.  Follow-up chest x-ray show improvement of infiltrate.  Patient was evaluated by physical therapy and occupational therapy, patient declined skilled nursing facility, home health services will be arranged.  2.  Acute on chronic hypoxic respiratory failure due to COPD exacerbation.  Patient received aggressive bronchodilator therapy, and systemic steroids.  He was successfully liberated for mechanical ventilation November 06, 2017.  Dyspnea continues to improve, patient will have an ambulatory oximetry before discharge, he may qualify for home oxygen.  Continue Advair, Spiriva and as needed albuterol.  3.  Acute kidney injury with hypokalemia.  Patient received conservative medical therapy, kidney function improved and potassium was corrected.  Discharge creatinine 0.77, potassium 4.0, sodium 134, serum bicarbonate 29.  4.  Colitis.  Patient responded well to medical therapy, symptoms resolved during his hospitalization, his diet was advanced with good toleration.  5.  Hypertension.  Patient was continued on hydrochlorothiazide and losartan for blood pressure control.  6.  Hypothyroidism.  Continue levothyroxine  7.  Depression.  Patient continue Celexa, no agitation or confusion.  8. Obesity. Poor oral intake related to acute illness, patient was placed on nutritional supplements.    Discharge Diagnoses:  Active Problems:   Acute respiratory failure  [facility])    Discharge Instructions   Allergies as of 11/13/2017  Reactions   Ciprofloxacin Nausea And Vomiting   Codeine Nausea And Vomiting      Medication List    STOP taking these medications   diclofenac sodium 1 % Gel Commonly known as:  VOLTAREN   methylPREDNISolone 4 MG Tbpk tablet Commonly known as:  MEDROL DOSEPAK     TAKE these medications   albuterol (2.5 MG/3ML) 0.083% nebulizer solution Commonly known as:  PROVENTIL USE 1 VIAL (3 ML) VIA NEBULIZER DAILY DX: J44.9   aspirin EC 81 MG tablet Take 81 mg by mouth daily.   citalopram 20 MG tablet Commonly known as:  CELEXA Take 1 tablet (20 mg total) by mouth daily.   docusate sodium 100 MG capsule Commonly known as:  COLACE Take 100 mg by mouth daily.   Fluticasone-Salmeterol 250-50 MCG/DOSE Aepb Commonly known as:  ADVAIR USE 1 INHALATION BY MOUTH EVERY 12 HOURS. <<RINSE MOUTH AFTER USE>>.   gabapentin 300 MG capsule Commonly known as:  NEURONTIN TAKE 1 CAPSULE BY MOUTH IN MORNING AND 2 CAPSULES AT NIGHT   hydrochlorothiazide 25 MG tablet Commonly known as:  HYDRODIURIL Take 1 tablet (25 mg total) by mouth daily.   HYDROcodone-acetaminophen 5-325 MG tablet Commonly known as:  NORCO/VICODIN Take 1 tablet every 6 (six) hours as needed by mouth for moderate pain.   levothyroxine 75 MCG tablet Commonly known as:  SYNTHROID, LEVOTHROID Take 1 tablet (75 mcg total) by mouth daily before breakfast.   losartan 100 MG tablet Commonly known as:  COZAAR Take 1 tablet (100 mg total) by mouth daily.   Melatonin 3 MG Tabs Take 3 mg by mouth at bedtime.   MILK OF MAGNESIA PO Take 30 mLs by mouth daily as needed (constipation).   mometasone 50 MCG/ACT nasal spray Commonly known as:  NASONEX Place 2 sprays into the nose daily.   montelukast 10 MG tablet Commonly known as:  SINGULAIR Take 1 tablet (10 mg total) by mouth at bedtime.   multivitamin with minerals Tabs tablet Take 1 tablet by mouth  daily.   omeprazole 20 MG capsule Commonly known as:  PRILOSEC Take 1 capsule (20 mg total) by mouth daily.   predniSONE 20 MG tablet Commonly known as:  DELTASONE Take 1 tablet (20 mg total) by mouth daily for 3 days.   PRESCRIPTION MEDICATION Patient uses CPAP at night   protein supplement shake Liqd Commonly known as:  PREMIER PROTEIN Take 325 mLs (11 oz total) by mouth daily.   SPIRIVA RESPIMAT 2.5 MCG/ACT Aers Generic drug:  Tiotropium Bromide Monohydrate INHALE 2 PUFFS BY MOUTH ONCE DAILY What changed:  See the new instructions.   TUMS ULTRA 1000 400 MG chewable tablet Generic drug:  calcium elemental as carbonate Chew 1,000 mg by mouth 3 (three) times daily as needed for heartburn.      Follow-up Information    [facility] Follow up.   Specialty:  [place] Why:  [facility] nursing/physical/occupational therapy/aide,social worker Contact information: [place] [address] [phone]          Allergies  Allergen Reactions  . Ciprofloxacin Nausea And Vomiting  . Codeine Nausea And Vomiting    Consultations:     Procedures/Studies: Dg Chest 2 View  Result Date: 10/26/2017 CLINICAL DATA:  Dyspnea starting today EXAM: CHEST - 2 VIEW COMPARISON:  Dec 10, 2015 FINDINGS: The heart size and mediastinal contours are stable. The aorta is tortuous. There is chronic central pulmonary vascular congestion unchanged compared to prior  x-ray. There is chronic scar of the left mid lung unchanged compared prior exam. There is no focal pneumonia frank pulmonary edema or pleural effusion. The visualized skeletal structures are stable. IMPRESSION: No active cardiopulmonary disease. Chronic central pulmonary vascular congestion unchanged compared to prior chest x-ray Dec 10, 2015 Electronically Signed   By: Abelardo Diesel M.D.   On: 10/26/2017 16:28   Dg Abd 1 View  Result Date: 11/06/2017 CLINICAL DATA:  Orogastric tube placement.  EXAM: ABDOMEN - 1 VIEW COMPARISON:  CT of the abdomen and pelvis performed earlier today at 4:18 p.m. FINDINGS: [name] enteric tube is noted ending overlying the body of the stomach. The visualized bowel gas pattern is unremarkable. Scattered air and stool filled loops of colon are seen; no abnormal dilatation of small bowel loops is seen to suggest small bowel obstruction. No free intra-abdominal air is identified, though evaluation for free air is limited on a single supine view. The visualized osseous structures are within normal limits; the sacroiliac joints are unremarkable in appearance. Mild left basilar atelectasis is noted. IMPRESSION: 1. Enteric tube noted ending overlying the body of the stomach. 2. Mild left basilar atelectasis noted. Electronically Signed   By: Garald Balding M.D.   On: 11/06/2017 00:34   Dg Abd 1 View  Result Date: 11/05/2017 CLINICAL DATA:  Orogastric tube placement. EXAM: ABDOMEN - 1 VIEW COMPARISON:  Radiographs of Dec 10, 2015. FINDINGS: The bowel gas pattern is normal. Distal tip of orogastric tube is seen in expected position of proximal stomach. IMPRESSION: Distal tip of orogastric tube seen in expected position of proximal stomach. No evidence of bowel obstruction or ileus. Electronically Signed   By: Marijo Conception, M.D.   On: 11/05/2017 11:46   Ct Head Wo Contrast  Result Date: 11/05/2017 CLINICAL DATA:  Altered level of consciousness. EXAM: CT HEAD WITHOUT CONTRAST TECHNIQUE: Contiguous axial images were obtained from the base of the skull through the vertex without intravenous contrast. COMPARISON:  None. FINDINGS: Brain: Generalized atrophy with moderate chronic microvascular ischemia in the white matter. No acute infarct, hemorrhage, or mass. Negative for hydrocephalus or shift of the midline structures. Vascular: Negative for hyperdense vessel Skull: Negative Sinuses/Orbits: Mucosal edema throughout the paranasal sinuses. Bilateral ocular surgery Other:  None IMPRESSION: No acute intracranial abnormality. Atrophy with chronic microvascular ischemia. Electronically Signed   By: Franchot Gallo M.D.   On: 11/05/2017 16:49   Ct Abdomen Pelvis W Contrast  Result Date: 11/05/2017 CLINICAL DATA:  COPD, pneumonia, abdominal pain EXAM: CT ABDOMEN AND PELVIS WITH CONTRAST TECHNIQUE: Multidetector CT imaging of the abdomen and pelvis was performed using the standard protocol following bolus administration of intravenous contrast. CONTRAST:  85mL ISOVUE-300 IOPAMIDOL (ISOVUE-300) INJECTION 61%, 122mL OMNIPAQUE IOHEXOL 300 MG/ML SOLN COMPARISON:  None. FINDINGS: Lower chest: Dense consolidative airspace process in the left lower lobe compatible with pneumonia. Minor additional patchy airspace disease or atelectasis in the lingula and right lower lobe dependently. No pericardial or pleural effusion. Normal heart size. NG tube at the very proximal stomach. Hepatobiliary: No focal liver abnormality is seen. No gallstones, gallbladder wall thickening, or biliary dilatation. Pancreas: Unremarkable. No pancreatic ductal dilatation or surrounding inflammatory changes. Spleen: Normal in size.  Small splenic granulomata noted Adrenals/Urinary Tract: Normal adrenal glands. Symmetric renal enhancement and excretion. No hydronephrosis obstruction. Foley catheter within the bladder which is collapsed. No ureteral calculus demonstrated. Stomach/Bowel: Negative for bowel obstruction, significant dilatation, ileus, or free air. Appendix not visualized. Sigmoid and rectum demonstrate mild  diffuse wall thickening with surrounding pericolonic strandy edema/inflammation compatible with distal colitis/proctitis. Rectal Foley in place. No fluid collection or abscess. No ascites. No free air. Vascular/Lymphatic: Aortic atherosclerosis and tortuosity. Negative for aneurysm or occlusive process. Mesenteric and renal vasculature appear patent. No adenopathy. Reproductive: No significant finding by CT  Other: Small fat containing right inguinal hernia. Small fat containing umbilical hernia also noted. No other abdominal wall hernia. Musculoskeletal: Degenerative changes noted spine with associated scoliosis. No compression fracture. Multilevel vacuum disc phenomenon noted. IMPRESSION: Mild acute sigmoid and rectum wall thickening with surrounding strandy edema/inflammation compatible with distal colitis/proctitis. No associated obstruction pattern, free air, or abscess. Consolidative left lower lobe pneumonia Abdominal atherosclerosis without aneurysm Small fat containing umbilical and right inguinal hernias Electronically Signed   By: Jerilynn Mages.  Shick M.D.   On: 11/05/2017 16:56   Dg Pelvis Portable  Result Date: 11/05/2017 CLINICAL DATA:  Status post unwitnessed fall, with concern for pelvic injury. Initial encounter. EXAM: PORTABLE PELVIS 1-2 VIEWS COMPARISON:  None. FINDINGS: There is no evidence of fracture or dislocation. Both femoral heads are seated normally within their respective acetabula. Mild degenerative change is noted at the lower lumbar spine. The sacroiliac joints are unremarkable in appearance. The visualized bowel gas pattern is grossly unremarkable in appearance. IMPRESSION: No evidence of fracture or dislocation. Electronically Signed   By: Garald Balding M.D.   On: 11/05/2017 05:41   Dg Chest Port 1 View  Result Date: 11/11/2017 CLINICAL DATA:  Shortness of breath. EXAM: PORTABLE CHEST 1 VIEW COMPARISON:  Radiograph of November 09, 2017. FINDINGS: Stable cardiomediastinal silhouette. No pneumothorax or pleural effusion is noted. Mild scarring is noted in right lung base. Left-sided pneumonia noted on prior exam is significantly improved. Bony thorax is unremarkable. IMPRESSION: Significantly improved left-sided pneumonia compared to prior exam. Electronically Signed   By: Marijo Conception, M.D.   On: 11/11/2017 09:21   Dg Chest Port 1 View  Result Date: 11/09/2017 CLINICAL DATA:  Increasing  abdominal distension and increasing shortness of breath and dyspnea for 1 day. EXAM: PORTABLE CHEST 1 VIEW COMPARISON:  11/07/2017. FINDINGS: Stable enlarged cardiac silhouette. Stable prominence of the interstitial markings. No overall significant change in patchy opacity in the left mid and lower lung zones. Thoracic spine degenerative changes. Cervical spine fixation hardware. IMPRESSION: 1. No significant change in pneumonia in the left mid and lower lung zones. 2. Stable cardiomegaly and chronic interstitial lung disease. Electronically Signed   By: Claudie Revering M.D.   On: 11/09/2017 09:23   Dg Chest Port 1 View  Result Date: 11/07/2017 CLINICAL DATA:  Respiratory failure EXAM: PORTABLE CHEST 1 VIEW COMPARISON:  November 05, 2017 FINDINGS: Endotracheal tube has been removed. No pneumothorax. There remains patchy airspace consolidation throughout much of the left lung, with the greatest degree of consolidation in the left upper lobe. Areas of mild scattered atelectatic change on the right noted without consolidation. Heart is mildly enlarged with pulmonary vascularity within normal limits. No adenopathy. There is aortic atherosclerosis. There is postoperative change in the lower cervical region. IMPRESSION: Airspace consolidation throughout much of the left lung with the greatest degree of consolidation left upper lobe. There are patchy areas of atelectasis on the right. Stable cardiomegaly. There is aortic atherosclerosis. No evident adenopathy. No pneumothorax. Aortic Atherosclerosis (ICD10-I70.0). Electronically Signed   By: Lowella Grip III M.D.   On: 11/07/2017 07:44   Dg Chest Port 1 View  Result Date: 11/05/2017 CLINICAL DATA:  Status post unwitnessed fall. Shortness  of breath. Initial encounter. EXAM: PORTABLE CHEST 1 VIEW COMPARISON:  Chest radiograph performed 10/25/2017 FINDINGS: The lungs are well-aerated. Left-sided airspace opacification is compatible with pneumonia. There is no evidence  of pleural effusion or pneumothorax. The cardiomediastinal silhouette is within normal limits. No acute osseous abnormalities are seen. Cervical spinal fusion hardware is partially imaged. IMPRESSION: Significant left-sided pneumonia noted. Electronically Signed   By: Garald Balding M.D.   On: 11/05/2017 05:36   Dg Chest Port 1v Same Day  Result Date: 11/05/2017 CLINICAL DATA:  Endotracheal tube placement. EXAM: PORTABLE CHEST 1 VIEW COMPARISON:  Chest radiograph performed earlier today at 5:06 a.m. FINDINGS: [name] endotracheal tube is seen ending 3-4 cm above the carina. An enteric tube is noted extending about the gastroesophageal junction. Persistent left-sided pneumonia is noted. The lungs are mildly hypoexpanded. No pleural effusion or pneumothorax is seen. The cardiomediastinal silhouette is normal in size. No acute osseous abnormalities are seen. Cervical spinal fusion hardware is partially imaged. IMPRESSION: 1. Endotracheal tube seen ending 3-4 cm above the carina. 2. Persistent left-sided pneumonia.  Lungs mildly hypoexpanded. Electronically Signed   By: Garald Balding M.D.   On: 11/05/2017 06:41   Dg Abd Portable 2v  Result Date: 11/09/2017 CLINICAL DATA:  Increasing abdominal distension. EXAM: PORTABLE ABDOMEN - 2 VIEW COMPARISON:  11/05/2017. FINDINGS: Paucity of intestinal gas. No visible dilated bowel loops. No free peritoneal air. Thoracolumbar spine degenerative changes and mild scoliosis. IMPRESSION: No acute abnormality. Electronically Signed   By: Claudie Revering M.D.   On: 11/09/2017 09:22       Subjective: Patient is feeling better, dyspnea at baseline, no nausea or vomiting, no chest pain. Positive lower extremity edema.   Discharge Exam: Vitals:   11/12/17 2151 11/13/17 0518  BP:  (!) 153/77  Pulse: 78 (!) 59  Resp: 18 14  Temp:  98.1 F (36.7 C)  SpO2: 95% 96%   Vitals:   11/12/17 2033 11/12/17 2058 11/12/17 2151 11/13/17 0518  BP:  (!) 159/91  (!) 153/77   Pulse:  76 78 (!) 59  Resp:  18 18 14   Temp:  97.6 F (36.4 C)  98.1 F (36.7 C)  TempSrc:  Oral  Oral  SpO2: 93% 93% 95% 96%  Weight:      Height:        General: Not in pain or dyspnea, Neurology: Awake and alert, non focal  E ENT: no pallor, no icterus, oral mucosa moist Cardiovascular: No JVD. S1-S2 present, rhythmic, no gallops, rubs, or murmurs. Pitting +/++  lower extremity edema. Pulmonary: decreased breath sounds bilaterally, decreased air movement, scattered rhonchi and rales. No wheezing.  Gastrointestinal. Abdomen with no organomegaly, non tender, no rebound or guarding Skin. No rashes Musculoskeletal: no joint deformities   The results of significant diagnostics from this hospitalization (including imaging, microbiology, ancillary and laboratory) are listed below for reference.     Microbiology: Recent Results (from the past 240 hour(s))  Urine culture     Status: None   Collection Time: 11/05/17  5:38 AM  Result Value Ref Range Status   Specimen Description   Final    URINE, RANDOM Performed at [facility], [address] [place], [place] [address]    Special Requests   Final    NONE Performed at [facility], [address] [place], [place] [address]    Culture   Final    NO GROWTH Performed at [facility], [place] [address], [place], [place] [address]  Report Status 11/06/2017 FINAL  Final  Blood Culture (routine x 2)     Status: None   Collection Time: 11/05/17  5:49 AM  Result Value Ref Range Status   Specimen Description BLOOD LEFT HAND  Final   Special Requests   Final    BOTTLES DRAWN AEROBIC ONLY Blood Culture results may not be optimal due to an inadequate volume of blood received in culture bottles   Culture   Final    NO GROWTH 5 DAYS Performed at [facility], [address] [place], [place] [address]    Report Status 11/10/2017 FINAL  Final  Blood Culture (routine x 2)     Status: None   Collection Time: 11/05/17  6:31 AM   Result Value Ref Range Status   Specimen Description BLOOD RIGHT HAND  Final   Special Requests   Final    ANAEROBIC BOTTLE ONLY Blood Culture results may not be optimal due to an inadequate volume of blood received in culture bottles   Culture   Final    NO GROWTH 5 DAYS Performed at [facility], [address], [place], [place] [address]    Report Status 11/10/2017 FINAL  Final  MRSA PCR Screening     Status: None   Collection Time: 11/05/17 12:44 PM  Result Value Ref Range Status   MRSA by PCR NEGATIVE NEGATIVE Final    Comment:        The GeneXpert MRSA Assay (FDA approved for NASAL specimens only), is one component of a comprehensive MRSA colonization surveillance program. It is not intended to diagnose MRSA infection nor to guide or monitor treatment for MRSA infections. Performed at [facility], [place] [address] [place], [place] [address]   Gastrointestinal Panel by PCR , Stool     Status: None   Collection Time: 11/06/17  1:32 PM  Result Value Ref Range Status   Campylobacter species NOT DETECTED NOT DETECTED Final   Plesimonas shigelloides NOT DETECTED NOT DETECTED Final   Salmonella species NOT DETECTED NOT DETECTED Final   Yersinia enterocolitica NOT DETECTED NOT DETECTED Final   Vibrio species NOT DETECTED NOT DETECTED Final   Vibrio cholerae NOT DETECTED NOT DETECTED Final   Enteroaggregative E coli (EAEC) NOT DETECTED NOT DETECTED Final   Enteropathogenic E coli (EPEC) NOT DETECTED NOT DETECTED Final   Enterotoxigenic E coli (ETEC) NOT DETECTED NOT DETECTED Final   Shiga like toxin producing E coli (STEC) NOT DETECTED NOT DETECTED Final   Shigella/Enteroinvasive E coli (EIEC) NOT DETECTED NOT DETECTED Final   Cryptosporidium NOT DETECTED NOT DETECTED Final   Cyclospora cayetanensis NOT DETECTED NOT DETECTED Final   Entamoeba histolytica NOT DETECTED NOT DETECTED Final   Giardia lamblia NOT DETECTED NOT DETECTED Final   Adenovirus F40/41  NOT DETECTED NOT DETECTED Final   Astrovirus NOT DETECTED NOT DETECTED Final   Norovirus GI/GII NOT DETECTED NOT DETECTED Final   Rotavirus A NOT DETECTED NOT DETECTED Final   Sapovirus (I, II, IV, and V) NOT DETECTED NOT DETECTED Final    Comment: Performed at [facility], [address] [place], [place] [address]     Labs: BNP (last 3 results) Recent Labs    11/05/17 0504  BNP 67.1   Basic Metabolic Panel: Recent Labs  Lab 11/09/17 0334 11/10/17 0322 11/11/17 0311 11/12/17 0722 11/13/17 0417  NA 137 135 136 136 134*  K 4.0 3.3* 3.6 3.4* 4.0  CL 102 97* 96* 96* 95*  CO2 26  26 29 28 29   GLUCOSE 104* 152* 153* 90 114*  BUN 19 20 25* 24* 22*  CREATININE 0.77 0.86 0.79 0.71 0.77  CALCIUM 8.3* 8.4* 8.8* 8.9 8.5*   Liver Function Tests: Recent Labs  Lab 11/07/17 0330  AST 40  ALT 22  ALKPHOS 49  BILITOT 0.7  PROT 5.9*  ALBUMIN 3.0*   No results for input(s): LIPASE, AMYLASE in the last 168 hours. No results for input(s): AMMONIA in the last 168 hours. CBC: Recent Labs  Lab 11/08/17 0253 11/09/17 0334 11/10/17 0322 11/11/17 0311 11/12/17 0722  WBC 16.6* 9.6 4.8 7.2 8.1  NEUTROABS 13.9* 6.3 3.8 4.9 3.9  HGB 11.0* 10.6* 11.2* 11.2* 12.6*  HCT 32.2* 30.3* 31.9* 32.7* 37.1*  MCV 93.6 93.5 91.7 91.6 92.3  PLT 199 215 258 330 419*   Cardiac Enzymes: No results for input(s): CKTOTAL, CKMB, CKMBINDEX, TROPONINI in the last 168 hours. BNP: Invalid input(s): POCBNP CBG: Recent Labs  Lab 11/08/17 1523 11/08/17 1948 11/08/17 2328 11/09/17 0333 11/09/17 0749  GLUCAP 105* 131* 104* 101* 101*   D-Dimer No results for input(s): DDIMER in the last 72 hours. Hgb A1c No results for input(s): HGBA1C in the last 72 hours. Lipid Profile No results for input(s): CHOL, HDL, LDLCALC, TRIG, CHOLHDL, LDLDIRECT in the last 72 hours. Thyroid function studies No results for input(s): TSH, T4TOTAL, T3FREE, THYROIDAB in the last 72 hours.  Invalid input(s):  FREET3 Anemia work up No results for input(s): VITAMINB12, FOLATE, FERRITIN, TIBC, IRON, RETICCTPCT in the last 72 hours. Urinalysis    Component Value Date/Time   COLORURINE YELLOW 11/05/2017 [place] 11/05/2017 0538   LABSPEC 1.006 11/05/2017 0538   PHURINE 7.0 11/05/2017 0538   GLUCOSEU NEGATIVE 11/05/2017 0538   HGBUR NEGATIVE 11/05/2017 0538   BILIRUBINUR NEGATIVE 11/05/2017 0538   KETONESUR NEGATIVE 11/05/2017 0538   PROTEINUR NEGATIVE 11/05/2017 0538   NITRITE NEGATIVE 11/05/2017 0538   LEUKOCYTESUR NEGATIVE 11/05/2017 0538   Sepsis Labs Invalid input(s): PROCALCITONIN,  WBC,  LACTICIDVEN Microbiology Recent Results (from the past 240 hour(s))  Urine culture     Status: None   Collection Time: 11/05/17  5:38 AM  Result Value Ref Range Status   Specimen Description   Final    URINE, RANDOM Performed at [facility], [address], [place], [place] [address]    Special Requests   Final    NONE Performed at [facility], [address] [place], [place] [address]    Culture   Final    NO GROWTH Performed at [facility], [place] [address] [place], [place] [address]    Report Status 11/06/2017 FINAL  Final  Blood Culture (routine x 2)     Status: None   Collection Time: 11/05/17  5:49 AM  Result Value Ref Range Status   Specimen Description BLOOD LEFT HAND  Final   Special Requests   Final    BOTTLES DRAWN AEROBIC ONLY Blood Culture results may not be optimal due to an inadequate volume of blood received in culture bottles   Culture   Final    NO GROWTH 5 DAYS Performed at [facility], [address], [place], [place] [address]    Report Status 11/10/2017 FINAL  Final  Blood Culture (routine x 2)     Status: None   Collection Time: 11/05/17  6:31 AM  Result Value Ref Range Status   Specimen Description BLOOD RIGHT HAND  Final   Special Requests  Final    ANAEROBIC BOTTLE ONLY Blood Culture results may not be optimal due to an inadequate  volume of blood received in culture bottles   Culture   Final    NO GROWTH 5 DAYS Performed at [facility], [address], [place], [place] [address]    Report Status 11/10/2017 FINAL  Final  MRSA PCR Screening     Status: None   Collection Time: 11/05/17 12:44 PM  Result Value Ref Range Status   MRSA by PCR NEGATIVE NEGATIVE Final    Comment:        The GeneXpert MRSA Assay (FDA approved for NASAL specimens only), is one component of a comprehensive MRSA colonization surveillance program. It is not intended to diagnose MRSA infection nor to guide or monitor treatment for MRSA infections. Performed at [facility], [place] [address], [place], [place] [address]   Gastrointestinal Panel by PCR , Stool     Status: None   Collection Time: 11/06/17  1:32 PM  Result Value Ref Range Status   Campylobacter species NOT DETECTED NOT DETECTED Final   Plesimonas shigelloides NOT DETECTED NOT DETECTED Final   Salmonella species NOT DETECTED NOT DETECTED Final   Yersinia enterocolitica NOT DETECTED NOT DETECTED Final   Vibrio species NOT DETECTED NOT DETECTED Final   Vibrio cholerae NOT DETECTED NOT DETECTED Final   Enteroaggregative E coli (EAEC) NOT DETECTED NOT DETECTED Final   Enteropathogenic E coli (EPEC) NOT DETECTED NOT DETECTED Final   Enterotoxigenic E coli (ETEC) NOT DETECTED NOT DETECTED Final   Shiga like toxin producing E coli (STEC) NOT DETECTED NOT DETECTED Final   Shigella/Enteroinvasive E coli (EIEC) NOT DETECTED NOT DETECTED Final   Cryptosporidium NOT DETECTED NOT DETECTED Final   Cyclospora cayetanensis NOT DETECTED NOT DETECTED Final   Entamoeba histolytica NOT DETECTED NOT DETECTED Final   Giardia lamblia NOT DETECTED NOT DETECTED Final   Adenovirus F40/41 NOT DETECTED NOT DETECTED Final   Astrovirus NOT DETECTED NOT DETECTED Final   Norovirus GI/GII NOT DETECTED NOT DETECTED Final   Rotavirus A NOT DETECTED NOT DETECTED Final   Sapovirus (I, II,  IV, and V) NOT DETECTED NOT DETECTED Final    Comment: Performed at [facility], [address], [place], [place] [address]     Time coordinating discharge: 45 minutes  SIGNED:   Tawni Millers, MD  [facility] 11/13/2017, 11:15 AM Pager [phone]  If 7PM-7AM, please contact night-coverage [email or web address] Password [email or web address]

## 2017-11-13 NOTE — Progress Notes (Signed)
Oxygen at sitting on room air - 93%. Oxygen at standing on room air - 93%. Oxygen at walking on room air - 86%

## 2017-11-13 NOTE — Consult Note (Signed)
   [facility] CM Inpatient Consult   11/13/2017  Medhansh Brinkmeier [facility] 1934-08-17 [id number]   Made aware by Tommi Rumps with [facility] that Mr. Mayhan will enroll in the [place] with Mr. Olvey prior to discharge to make aware that if transportation or medication concern needs are identified while on the [place] program, [place] Management will assist.  Mr. Dimare expresses appreciation of the call.   Will notify [place] Management office of [facility] enrollment.   Marthenia Rolling, MSN-Ed, RN,BSN [facility] Liaison [phone]

## 2017-11-13 NOTE — Care Management Note (Signed)
Case Management Note  Patient Details  Name: MC BLOODWORTH MRN: [id number] Date of Birth: 01-19-1935  Subjective/Objective: Documented 02 sats received. Faxed home 02 order,& 02 sats to [facility] w/confirmation-rep Bonnie aware-will send driver to [facility] to deliver home 02 travel tank to rm prior d/c-Nsg notified.                   Action/Plan:d/c home w/home 02/HHC.   Expected Discharge Date:  11/13/17               Expected Discharge Plan:  [place]  In-House Referral:     Discharge planning Services  CM Consult  Post Acute Care Choice:  Durable Medical Equipment(rollator, 3n1) Choice offered to:  Patient  DME Arranged:  Oxygen DME Agency:  [facility]  HH Arranged:  RN, PT, OT, Nurse's Aide, Social Work [employer] Agency:  [facility]  Status of Service:  Completed, signed off  If discussed at [employer] of Stay Meetings, dates discussed:    Additional Comments:  Dessa Phi, RN 11/13/2017, 12:44 PM

## 2017-11-13 NOTE — Progress Notes (Signed)
SATURATION QUALIFICATIONS: (This note is used to comply with regulatory documentation for home oxygen)  Patient Saturations on Room Air at Rest = 93%  Patient Saturations on Room Air while Ambulating = 86%  Patient Saturations on 3 Liters of oxygen while Ambulating = 96%  Please briefly explain why patient needs home oxygen: Deaturating with ambulation

## 2017-11-13 NOTE — Telephone Encounter (Signed)
Spoke with patient's wife--she wanted to make sure gabapentin and losartan had been called in. Confirmed with her these were signed today.  Olene Floss, MD [place], PGY-3

## 2017-11-13 NOTE — Progress Notes (Signed)
If HHC, dme needed please put in [facility] orders, & face to face.

## 2017-11-13 NOTE — Progress Notes (Addendum)
Occupational Therapy Treatment Patient Details Name: Cristian Yang MRN: [id number] DOB: Jun 24, 1935 Today's Date: 11/13/2017    History of present illness 83 yo male found down with SOB, N/V, diarrhea, intubated 11/05/17 extubated 11/06/17. Pt being treated for acute respiratory failure due to CAP / septic shock PMH: COPD, OSA, CPAP, GERD, HTN, hypothyroidism    OT comments  Pt on 3 liters 02 today; less dyspneic.  Performed LB adls, toilet transfer and stood for oral care at bathroom sink. Pt initiated one rest break without cues; OT cued him for 2 others.   Follow Up Recommendations  Supervision/Assistance - 24 hour;Home health OT(pt declines snf)    Equipment Recommendations    none by OT. Pt has a 3:1  He can use over commode if needed   Recommendations for Other Services      Precautions / Restrictions Precautions Precautions: Fall Precaution Comments: monitor O2 Restrictions Weight Bearing Restrictions: No       Mobility Bed Mobility               General bed mobility comments: oob  Transfers   Equipment used: Rolling walker (2 wheeled)   Sit to Stand: Supervision              Balance                                           ADL either performed or assessed with clinical judgement   ADL       Grooming: Supervision/safety;Standing;Oral care       Lower Body Bathing: Supervison/ safety;Sit to/from stand       Lower Body Dressing: Supervision/safety;Sit to/from stand   Toilet Transfer: Supervision/safety;Ambulation;Comfort height toilet;RW             General ADL Comments: Pt performed LB bathing and changed socks with rest breaks.  He initiated one himself today. Ambulated to bathroom and stood for grooming.      Vision       Perception     Praxis      Cognition Arousal/Alertness: Awake/alert Behavior During Therapy: WFL for tasks assessed/performed Overall Cognitive Status: Within Functional Limits for  tasks assessed                                 General Comments: talks excessively, requires cues to stop talking and breathe during mobility        Exercises     Shoulder Instructions       General Comments pt was on 3 liters 02 today. Sats 93%    Pertinent Vitals/ Pain       Pain Assessment: No/denies pain  Home Living                                          Prior Functioning/Environment              Frequency  Min 2X/week        Progress Toward Goals  OT Goals(current goals can now be found in the care plan section)  Progress towards OT goals: Progressing toward goals     Plan      Co-evaluation  AM-PAC PT "6 Clicks" Daily Activity     Outcome Measure   Help from another person eating meals?: None Help from another person taking care of personal grooming?: A Little Help from another person toileting, which includes using toliet, bedpan, or urinal?: A Little Help from another person bathing (including washing, rinsing, drying)?: A Little Help from another person to put on and taking off regular upper body clothing?: A Little Help from another person to put on and taking off regular lower body clothing?: A Little 6 Click Score: 19    End of Session Equipment Utilized During Treatment: Oxygen  OT Visit Diagnosis: Unsteadiness on feet (R26.81);Muscle weakness (generalized) (M62.81)   Activity Tolerance Patient tolerated treatment well   Patient Left in chair;with call bell/phone within reach   Nurse Communication          Time: 4604-7998 OT Time Calculation (min): 16 min  Charges: OT General Charges $OT Visit: 1 Visit OT Treatments $Self Care/Home Management : 8-22 mins  Lesle Chris, OTR/L [phone] 11/13/2017   [name] 11/13/2017, 10:00 AM

## 2017-11-13 NOTE — Telephone Encounter (Signed)
Patient wife Emerson Monte, asks if PCP will call her. She has questions regarding patient's medications that don't seem to be working.   Call back is [phone].  Danley Danker, RN [facility] RN)

## 2017-11-13 NOTE — Progress Notes (Addendum)
Oxygen delivered to pt's room by [facility]. Awaiting call back from MD to clarify if pt needs SLP eval prior to discharge. I spoke with Tammy, Speech Therapist, who agreed with earlier assessment that pt's symptoms are GERD related and could be handle pharmacologically. I am unsure if pt needs SLT eval prior to discharge so awaiting final decision from MD. MD paged three times.  Charge RN made aware of discharge and that SLP eval was ordered but pt cleared by SLP Tammy as per prior note. Charge RN advised RN that discharge is acceptable given discharge orders are placed. PIV removed without complication. Pt escroted off of unit to car and care of friends via wheelchair.

## 2017-11-13 NOTE — Care Management Important Message (Signed)
Important Message  Patient Details  Name: Cristian Yang MRN: [id number] Date of Birth: 28-Nov-1934   Medicare Important Message Given:  Yes    Kerin Salen 11/13/2017, 11:06 AMImportant Message  Patient Details  Name: Cristian Yang MRN: [id number] Date of Birth: [birth year]   Medicare Important Message Given:  Yes    Kerin Salen 11/13/2017, 11:06 AM

## 2017-11-14 ENCOUNTER — Other Ambulatory Visit: Payer: Self-pay | Admitting: Internal Medicine

## 2017-11-14 DIAGNOSIS — J181 Lobar pneumonia, unspecified organism: Secondary | ICD-10-CM | POA: Diagnosis not present

## 2017-11-14 DIAGNOSIS — J440 Chronic obstructive pulmonary disease with acute lower respiratory infection: Secondary | ICD-10-CM | POA: Diagnosis not present

## 2017-11-15 DIAGNOSIS — J181 Lobar pneumonia, unspecified organism: Secondary | ICD-10-CM | POA: Diagnosis not present

## 2017-11-15 DIAGNOSIS — J440 Chronic obstructive pulmonary disease with acute lower respiratory infection: Secondary | ICD-10-CM | POA: Diagnosis not present

## 2017-11-17 ENCOUNTER — Ambulatory Visit: Payer: Medicare Other | Admitting: Pulmonary Disease

## 2017-11-17 DIAGNOSIS — J440 Chronic obstructive pulmonary disease with acute lower respiratory infection: Secondary | ICD-10-CM | POA: Diagnosis not present

## 2017-11-17 DIAGNOSIS — J181 Lobar pneumonia, unspecified organism: Secondary | ICD-10-CM | POA: Diagnosis not present

## 2017-11-20 DIAGNOSIS — J440 Chronic obstructive pulmonary disease with acute lower respiratory infection: Secondary | ICD-10-CM | POA: Diagnosis not present

## 2017-11-20 DIAGNOSIS — J181 Lobar pneumonia, unspecified organism: Secondary | ICD-10-CM | POA: Diagnosis not present

## 2017-11-21 ENCOUNTER — Ambulatory Visit
Admission: RE | Admit: 2017-11-21 | Discharge: 2017-11-21 | Disposition: A | Discharge status: Home / Self Care | Payer: Medicare Other | Source: Ambulatory Visit | Attending: Family Medicine | Admitting: Family Medicine

## 2017-11-21 ENCOUNTER — Ambulatory Visit (INDEPENDENT_AMBULATORY_CARE_PROVIDER_SITE_OTHER): Payer: Medicare Other | Admitting: Family Medicine

## 2017-11-21 ENCOUNTER — Other Ambulatory Visit: Payer: Self-pay

## 2017-11-21 ENCOUNTER — Encounter: Payer: Self-pay | Admitting: Family Medicine

## 2017-11-21 VITALS — BP 110/70 | HR 69 | Temp 98.3°F | Ht 68.0 in | Wt 225.0 lb

## 2017-11-21 DIAGNOSIS — R05 Cough: Secondary | ICD-10-CM

## 2017-11-21 DIAGNOSIS — R059 Cough, unspecified: Secondary | ICD-10-CM

## 2017-11-21 DIAGNOSIS — J189 Pneumonia, unspecified organism: Secondary | ICD-10-CM | POA: Diagnosis not present

## 2017-11-21 MED ORDER — LIDOCAINE 5 % EX OINT: 1.0000 "application " | TOPICAL_OINTMENT | Freq: Three times a day (TID) | CUTANEOUS | 1 refills | Status: DC | PRN

## 2017-11-21 NOTE — Patient Instructions (Addendum)
It was great to meet you today! Thank you for letting me participate in your care!  Today, we discussed your cough and left sided rib pain. It is most likely a pulled muscle. However, I do feel like a chest x-ray is reasonable to rule out any other possible causes like PNA or a rib fracture.  I want you to take Percocet every 8 hours for the next 3-5 days along with 400mg  of Iburprofen every 8 hours. You can take these together. You can also continue to use the heating pad. I also wrote for you a lidocaine gel to help with the pain.  Be well, Harolyn Rutherford, DO PGY-1, [name]

## 2017-11-21 NOTE — Progress Notes (Signed)
Subjective: Chief Complaint  Patient presents with  . Cough    would like to change home health care agency to [facility]  . left rib cage pain    HPI: Cristian Yang is a 83 y.o. presenting to clinic today to discuss the following:  Cough Patient was recently seen and hospitalized due to left lower lobe PNA for total of 11 days, 9 of which were spent in the ICU. He has started having a productive cough with clear phelgm. Since his hospital stay he has a sharp pain "feels like someone hit me with a 2x4". Percocet and heating pad has helped control the pain. He endorses a loss of appetite.  He denies fever, chills, nausea, vomiting, abdominal pain, diarrhea, constipation.   Health Maintenance: None today     ROS noted in HPI.   Past Medical, Surgical, Social, and Family History Reviewed & Updated per EMR.   Pertinent Historical Findings include:   Social History   Tobacco Use  Smoking Status Former Smoker  . Packs/day: 2.00  . Years: 30.00  . Pack years: 60.00  . Types: Cigarettes  . Last attempt to quit: 07/23/2003  . Years since quitting: 14.3  Smokeless Tobacco Never Used   Objective: BP 110/70 (BP Location: Left Arm, Patient Position: Sitting, Cuff Size: Normal)   Pulse 69   Temp 98.3 F (36.8 C) (Oral)   Ht 5\' 8"  (1.727 m)   Wt 225 lb (102.1 kg)   SpO2 94%   BMI 34.21 kg/m  Vitals and nursing notes reviewed  Physical Exam  Constitutional: He is oriented to person, place, and time. He appears well-developed. No distress.  HENT:  Head: Normocephalic and atraumatic.  Eyes: Pupils are equal, round, and reactive to light. EOM are normal.  Neck: Normal range of motion. No thyromegaly present.  Cardiovascular: Normal rate, regular rhythm, normal heart sounds and intact distal pulses.  No murmur heard. Pulmonary/Chest: Effort normal. No stridor. No respiratory distress. He has no wheezes. He has rales.  Rhonchi bilaterally in mid lung fields, faint  crackles in bibasilar lung fields.  Abdominal: Soft. Bowel sounds are normal. He exhibits no distension. There is no tenderness. There is no guarding.  Musculoskeletal: Normal range of motion. He exhibits tenderness. He exhibits no edema or deformity.  TTP in left rib cage at ribs 8-9 along the mid axillary line  Lymphadenopathy:    He has no cervical adenopathy.  Neurological: He is alert and oriented to person, place, and time.  Skin: Skin is warm and dry. Capillary refill takes less than 2 seconds. No rash noted. No erythema.   No results found for this or any previous visit (from the past 72 hour(s)).  Assessment/Plan:  Cough CXR 2 view to help rule out worsening or recurring PNA. Doubtful due to exam and symptoms but low threshold to treat given his history.  Prescribed Voltaren gel for rib pain that is likely due to muscle strain secondary to cough   PATIENT EDUCATION PROVIDED: See AVS    Diagnosis and plan along with any newly prescribed medication(s) were discussed in detail with this patient today. The patient verbalized understanding and agreed with the plan. Patient advised if symptoms worsen return to clinic or ER.   Health Maintainance:   Orders Placed This Encounter  Procedures  . DG Chest 2 View    Standing Status:   Future    Number of Occurrences:   1    Standing  Expiration Date:   11/22/2018    Order Specific Question:   Reason for Exam (SYMPTOM  OR DIAGNOSIS REQUIRED)    Answer:   concern for PNA    Order Specific Question:   Preferred imaging location?    Answer:   [facility]    Order Specific Question:   Radiology Contrast Protocol - do NOT remove file path    Answer:   [email or web address]    Meds ordered this encounter  Medications  . lidocaine (XYLOCAINE) 5 % ointment    Sig: Apply 1 application topically 3 (three) times daily as needed. Apply to affected area    Dispense:  35.44 g    Refill:  [place], DO 11/26/2017, 12:22 AM PGY-1, [place]

## 2017-11-22 DIAGNOSIS — J181 Lobar pneumonia, unspecified organism: Secondary | ICD-10-CM | POA: Diagnosis not present

## 2017-11-22 DIAGNOSIS — J440 Chronic obstructive pulmonary disease with acute lower respiratory infection: Secondary | ICD-10-CM | POA: Diagnosis not present

## 2017-11-24 ENCOUNTER — Telehealth: Payer: Self-pay

## 2017-11-24 ENCOUNTER — Telehealth: Payer: Self-pay | Admitting: Internal Medicine

## 2017-11-24 ENCOUNTER — Other Ambulatory Visit: Payer: Self-pay | Admitting: Internal Medicine

## 2017-11-24 DIAGNOSIS — J181 Lobar pneumonia, unspecified organism: Secondary | ICD-10-CM | POA: Diagnosis not present

## 2017-11-24 DIAGNOSIS — J440 Chronic obstructive pulmonary disease with acute lower respiratory infection: Secondary | ICD-10-CM | POA: Diagnosis not present

## 2017-11-24 DIAGNOSIS — R5381 Other malaise: Secondary | ICD-10-CM

## 2017-11-24 NOTE — Telephone Encounter (Signed)
Pt wife called and was very upset which made it hard to understand what she was saying. From what I was able to understand she wants her husbands home care changed to who comes to help her. She said she would like to be contacted ASAP.

## 2017-11-24 NOTE — Telephone Encounter (Signed)
Called patient. She is distraught over treatment by [facility]. Let her know I had placed [place] orders for her husband with request for [facility]. She requests a walker. I will place this order for her. Patients both have appointments with me tomorrow, 5/7.   Olene Floss, MD [place], PGY-3

## 2017-11-24 NOTE — Telephone Encounter (Signed)
Received fax from [facility] requesting prior authorization of Lidocaine ointment. PA submitted via CoverMyMeds. Status pending. Will receive fax within 3-5 business days. Danley Danker, RN [facility] RN)

## 2017-11-25 ENCOUNTER — Ambulatory Visit (INDEPENDENT_AMBULATORY_CARE_PROVIDER_SITE_OTHER): Payer: Medicare Other | Admitting: Internal Medicine

## 2017-11-25 VITALS — BP 112/70 | HR 60 | Temp 97.8°F | Ht 68.0 in | Wt 228.0 lb

## 2017-11-25 DIAGNOSIS — J439 Emphysema, unspecified: Secondary | ICD-10-CM

## 2017-11-25 DIAGNOSIS — K219 Gastro-esophageal reflux disease without esophagitis: Secondary | ICD-10-CM | POA: Diagnosis not present

## 2017-11-25 DIAGNOSIS — J181 Lobar pneumonia, unspecified organism: Secondary | ICD-10-CM

## 2017-11-25 DIAGNOSIS — J189 Pneumonia, unspecified organism: Secondary | ICD-10-CM

## 2017-11-25 MED ORDER — OMEPRAZOLE 20 MG PO CPDR: 20.0000 mg | DELAYED_RELEASE_CAPSULE | Freq: Every day | ORAL | 3 refills | Status: DC

## 2017-11-25 NOTE — Telephone Encounter (Signed)
Prior approval for Lidocaine ointment completed via CoverMyMeds.. Med approved for 08/26/17 - 11/24/18. [place] informed. Danley Danker, RN [facility] RN)

## 2017-11-25 NOTE — Patient Instructions (Signed)
Cristian Yang,  Please increase use of your albuterol as needed for wheezing or increased shortness of breath. You can take this every 4-6 hours as needed. If you have worsening shortness of breath, I need to know.  Please continue prilosec for reflux.  I will refer you to gastroenterology for reflux, as well.  Best, Dr. Ola Spurr

## 2017-11-26 ENCOUNTER — Encounter: Payer: Self-pay | Admitting: Family Medicine

## 2017-11-26 DIAGNOSIS — R05 Cough: Secondary | ICD-10-CM | POA: Insufficient documentation

## 2017-11-26 DIAGNOSIS — R059 Cough, unspecified: Secondary | ICD-10-CM | POA: Insufficient documentation

## 2017-11-26 NOTE — Progress Notes (Signed)
Printing letter to send to patient and will call patient with results

## 2017-11-26 NOTE — Assessment & Plan Note (Signed)
CXR 2 view to help rule out worsening or recurring PNA. Doubtful due to exam and symptoms but low threshold to treat given his history.  Prescribed Voltaren gel for rib pain that is likely due to muscle strain secondary to cough

## 2017-11-27 ENCOUNTER — Encounter: Payer: Self-pay | Admitting: Internal Medicine

## 2017-11-27 DIAGNOSIS — J189 Pneumonia, unspecified organism: Secondary | ICD-10-CM | POA: Insufficient documentation

## 2017-11-27 NOTE — Progress Notes (Signed)
[facility] Progress Note  Subjective:  Cristian Yang is a 83 y.o. male with history of COPD, OSA, chronic back pain, HTN, and GERD who presents for hospital follow-up for pneumonia. He was seen at the end of last week in clinic for left ribcage pain. Repeat CXR showed LLL infiltrate thought to be residual--patient afebrile, not requiring increased O2. He reports improvement with lidocaine ointment. He denies cough and says shortness of breath has improved. He has been using his spiriva and advair inhalers but not albuterol. PT had been trying to help him wean off O2, but family has requested new home health provider because patient's wife says [name] employee was rude to them and patient does not feel they were doing enough to help him. Patient had been admitted from 4/17-4/25/19, including time in the ICU, due to septic shock from LLL pneumonia. Hospitalization was complicated by respiratory failure, colitis, and acute kidney injury. He was discharged on 3L continuous O2 and will follow-up with pulmonology next month. Patient denies any further episodes of spitting up into his CPAP mask due to severe reflux. He was not taking PPI consistently and had been using tums many times a day. Since hospital discharge, he has been taking prilosec with improvement in reflux symptoms. He also attributes improvement in symptoms to weight loss from decreased appetite from recent illness. He is now watching his portions more closely. He was found collapsed in the bathroom prior to recent hospitalization. He recalls going to the bathroom prior to the incident but not much else. He had negative head CT as part of his work-up. He requests PT referral to [name]. ROS: No fever, appetite improving, has continued fatigue but improving somewhat, no diarrhea   Allergies  Allergen Reactions  . Ciprofloxacin Nausea And Vomiting  . Codeine Nausea And Vomiting    Social History   Tobacco Use  .  Smoking status: Former Smoker    Packs/day: 2.00    Years: 30.00    Pack years: 60.00    Types: Cigarettes    Last attempt to quit: 07/23/2003    Years since quitting: 14.3  . Smokeless tobacco: Never Used  Substance Use Topics  . Alcohol use: No    Alcohol/week: 0.0 oz    Comment: quit 2005    Objective: Blood pressure 112/70, pulse 60, temperature 97.8 F (36.6 C), height 5\' 8"  (1.727 m), weight 228 lb (103.4 kg), SpO2 97 %. Body mass index is 34.67 kg/m. Constitutional: Pleasant, obese male in NAD Cardiovascular: RRR, systolic ejection murmur (heard previously) Pulmonary/Chest: Diffuse mild wheezing throughout posterior lung fields with good air movement. No increased WOB. New [place] in place.  Abdominal: Soft. +BS, NT Musculoskeletal: No LE edema Neurological: AOx3, no focal deficits. Skin: Skin is warm and dry. No rash noted.  Psychiatric: Normal mood and affect.  Vitals reviewed  Assessment/Plan: Pneumonia - Improving. Decreased chest wall pain, cough. Has diffuse wheezing but suspect 2/2 COPD. - Gave strict return precautions of fever, increased cough.  - Placed PT/OT referrals for new Home Health agency [facility]) as patient continues to recover.   GERD (gastroesophageal reflux disease) - Improved control with weight loss and consistent use of PPI. - Will refer to GI for consideration of endoscopy given history of recalcitrant GERD causing vomiting, especially in setting of ongoing CPAP use - Encouraged continuing smaller meals and elevating head of bed  Chronic obstructive pulmonary disease ([place]) - Wheezing on exam today. Encouraged patient to use his albuterol  nebulizer q4-6h prn. If no improvement, asked him to let me know. If shortness of breath worsens/not improving, would want to see him back for consideration of another steroid taper +/- antibiotics for possible COPD exacerbation--but no increased sputum production, fever, or increased shortness of breath to suggest this at  this time.  Follow-up prn.  Olene Floss, MD [place], PGY-3

## 2017-11-27 NOTE — Assessment & Plan Note (Signed)
-   Wheezing on exam today. Encouraged patient to use his albuterol nebulizer q4-6h prn. If no improvement, asked him to let me know. If shortness of breath worsens/not improving, would want to see him back for consideration of another steroid taper +/- antibiotics for possible COPD exacerbation--but no increased sputum production, fever, or increased shortness of breath to suggest this at this time.

## 2017-11-27 NOTE — Assessment & Plan Note (Addendum)
-   Improving. Decreased chest wall pain, cough. Has diffuse wheezing but suspect 2/2 COPD. - Gave strict return precautions of fever, increased cough.  - Placed PT/OT referrals for new Home Health agency [facility]) as patient continues to recover.

## 2017-11-27 NOTE — Assessment & Plan Note (Addendum)
-   Improved control with weight loss and consistent use of PPI. - Will refer to GI for consideration of endoscopy given history of recalcitrant GERD causing vomiting, especially in setting of ongoing CPAP use - Encouraged continuing smaller meals and elevating head of bed

## 2017-11-28 DIAGNOSIS — M5030 Other cervical disc degeneration, unspecified cervical region: Secondary | ICD-10-CM | POA: Diagnosis not present

## 2017-11-28 DIAGNOSIS — Z79891 Long term (current) use of opiate analgesic: Secondary | ICD-10-CM | POA: Diagnosis not present

## 2017-11-28 DIAGNOSIS — Z79899 Other long term (current) drug therapy: Secondary | ICD-10-CM | POA: Diagnosis not present

## 2017-11-28 DIAGNOSIS — G894 Chronic pain syndrome: Secondary | ICD-10-CM | POA: Diagnosis not present

## 2017-11-28 DIAGNOSIS — M4316 Spondylolisthesis, lumbar region: Secondary | ICD-10-CM | POA: Diagnosis not present

## 2017-11-29 DIAGNOSIS — Z9981 Dependence on supplemental oxygen: Secondary | ICD-10-CM | POA: Diagnosis not present

## 2017-11-29 DIAGNOSIS — K219 Gastro-esophageal reflux disease without esophagitis: Secondary | ICD-10-CM | POA: Diagnosis not present

## 2017-11-29 DIAGNOSIS — Z87891 Personal history of nicotine dependence: Secondary | ICD-10-CM | POA: Diagnosis not present

## 2017-11-29 DIAGNOSIS — J181 Lobar pneumonia, unspecified organism: Secondary | ICD-10-CM | POA: Diagnosis not present

## 2017-11-29 DIAGNOSIS — R2689 Other abnormalities of gait and mobility: Secondary | ICD-10-CM | POA: Diagnosis not present

## 2017-11-29 DIAGNOSIS — R0789 Other chest pain: Secondary | ICD-10-CM | POA: Diagnosis not present

## 2017-11-29 DIAGNOSIS — Z79899 Other long term (current) drug therapy: Secondary | ICD-10-CM | POA: Diagnosis not present

## 2017-12-01 ENCOUNTER — Telehealth: Payer: Self-pay

## 2017-12-01 ENCOUNTER — Other Ambulatory Visit: Payer: Self-pay | Admitting: Internal Medicine

## 2017-12-01 DIAGNOSIS — M545 Low back pain: Principal | ICD-10-CM

## 2017-12-01 DIAGNOSIS — Z79899 Other long term (current) drug therapy: Secondary | ICD-10-CM | POA: Diagnosis not present

## 2017-12-01 DIAGNOSIS — Z9981 Dependence on supplemental oxygen: Secondary | ICD-10-CM | POA: Diagnosis not present

## 2017-12-01 DIAGNOSIS — J181 Lobar pneumonia, unspecified organism: Secondary | ICD-10-CM | POA: Diagnosis not present

## 2017-12-01 DIAGNOSIS — R0789 Other chest pain: Secondary | ICD-10-CM | POA: Diagnosis not present

## 2017-12-01 DIAGNOSIS — K219 Gastro-esophageal reflux disease without esophagitis: Secondary | ICD-10-CM | POA: Diagnosis not present

## 2017-12-01 DIAGNOSIS — R2689 Other abnormalities of gait and mobility: Secondary | ICD-10-CM | POA: Diagnosis not present

## 2017-12-01 DIAGNOSIS — G8929 Other chronic pain: Secondary | ICD-10-CM

## 2017-12-01 NOTE — Telephone Encounter (Signed)
Amy, PT with [facility], called for following verbal orders:  PT 1 x/week x 1 week, then 2x/ week x 3 weeks, also HH aide 2 x/week x 3 weeks  For endurance and strengthening  Call back [phone]  Danley Danker, RN [facility] RN)

## 2017-12-01 NOTE — Telephone Encounter (Signed)
Confirmed orders with Amy from [facility]. 

## 2017-12-02 DIAGNOSIS — J181 Lobar pneumonia, unspecified organism: Secondary | ICD-10-CM | POA: Diagnosis not present

## 2017-12-02 DIAGNOSIS — K219 Gastro-esophageal reflux disease without esophagitis: Secondary | ICD-10-CM | POA: Diagnosis not present

## 2017-12-02 DIAGNOSIS — R0789 Other chest pain: Secondary | ICD-10-CM | POA: Diagnosis not present

## 2017-12-02 DIAGNOSIS — R2689 Other abnormalities of gait and mobility: Secondary | ICD-10-CM | POA: Diagnosis not present

## 2017-12-02 DIAGNOSIS — Z79899 Other long term (current) drug therapy: Secondary | ICD-10-CM | POA: Diagnosis not present

## 2017-12-02 DIAGNOSIS — Z9981 Dependence on supplemental oxygen: Secondary | ICD-10-CM | POA: Diagnosis not present

## 2017-12-04 DIAGNOSIS — J181 Lobar pneumonia, unspecified organism: Secondary | ICD-10-CM | POA: Diagnosis not present

## 2017-12-04 DIAGNOSIS — R0789 Other chest pain: Secondary | ICD-10-CM | POA: Diagnosis not present

## 2017-12-04 DIAGNOSIS — K219 Gastro-esophageal reflux disease without esophagitis: Secondary | ICD-10-CM | POA: Diagnosis not present

## 2017-12-04 DIAGNOSIS — Z9981 Dependence on supplemental oxygen: Secondary | ICD-10-CM | POA: Diagnosis not present

## 2017-12-04 DIAGNOSIS — R2689 Other abnormalities of gait and mobility: Secondary | ICD-10-CM | POA: Diagnosis not present

## 2017-12-04 DIAGNOSIS — Z79899 Other long term (current) drug therapy: Secondary | ICD-10-CM | POA: Diagnosis not present

## 2017-12-05 ENCOUNTER — Telehealth: Payer: Self-pay | Admitting: Pulmonary Disease

## 2017-12-05 DIAGNOSIS — Z9981 Dependence on supplemental oxygen: Secondary | ICD-10-CM | POA: Diagnosis not present

## 2017-12-05 DIAGNOSIS — K219 Gastro-esophageal reflux disease without esophagitis: Secondary | ICD-10-CM | POA: Diagnosis not present

## 2017-12-05 DIAGNOSIS — Z79899 Other long term (current) drug therapy: Secondary | ICD-10-CM | POA: Diagnosis not present

## 2017-12-05 DIAGNOSIS — R0789 Other chest pain: Secondary | ICD-10-CM | POA: Diagnosis not present

## 2017-12-05 DIAGNOSIS — J181 Lobar pneumonia, unspecified organism: Secondary | ICD-10-CM | POA: Diagnosis not present

## 2017-12-05 DIAGNOSIS — R2689 Other abnormalities of gait and mobility: Secondary | ICD-10-CM | POA: Diagnosis not present

## 2017-12-05 NOTE — Telephone Encounter (Signed)
Called and spoke with Tiffany from [place], she stated that patient has transferred to [facility] in [place]. Tiffany stated that they would be able to give me all information on this patient and his situation. I was transferred to this office and no one answered. Left message to give [place] a call back.

## 2017-12-07 DIAGNOSIS — R0789 Other chest pain: Secondary | ICD-10-CM | POA: Diagnosis not present

## 2017-12-07 DIAGNOSIS — Z79899 Other long term (current) drug therapy: Secondary | ICD-10-CM | POA: Diagnosis not present

## 2017-12-07 DIAGNOSIS — R2689 Other abnormalities of gait and mobility: Secondary | ICD-10-CM | POA: Diagnosis not present

## 2017-12-07 DIAGNOSIS — Z9981 Dependence on supplemental oxygen: Secondary | ICD-10-CM | POA: Diagnosis not present

## 2017-12-07 DIAGNOSIS — J181 Lobar pneumonia, unspecified organism: Secondary | ICD-10-CM | POA: Diagnosis not present

## 2017-12-07 DIAGNOSIS — K219 Gastro-esophageal reflux disease without esophagitis: Secondary | ICD-10-CM | POA: Diagnosis not present

## 2017-12-08 ENCOUNTER — Ambulatory Visit: Payer: Medicare Other | Admitting: Internal Medicine

## 2017-12-08 NOTE — Telephone Encounter (Signed)
Spoke with pt because he has never been prescribed O2 by our office- pt was recently seen in hospital for PNA and was started on 3lpm continuous O2 at hospital stay.  Pt is requesting an order for a POC as well as 16 e-tanks.  I advised pt that we would need to see him in clinic to order new equipment, as he has not been seen since 02/2017.  I had attempted to schedule an appt with pt on 5/28 with TP to evaluate, but after scheduling pt's wife Emerson Monte (dpr on file) got on the phone and advised that he cannot go to this visit as she is sick and he is unable to leave her alone.  Pt has a previously scheduled appt on 6/7 with VS, pt wishes to keep that OV.    Nothing further needed at this time.

## 2017-12-09 DIAGNOSIS — R0789 Other chest pain: Secondary | ICD-10-CM | POA: Diagnosis not present

## 2017-12-09 DIAGNOSIS — Z79899 Other long term (current) drug therapy: Secondary | ICD-10-CM | POA: Diagnosis not present

## 2017-12-09 DIAGNOSIS — R2689 Other abnormalities of gait and mobility: Secondary | ICD-10-CM | POA: Diagnosis not present

## 2017-12-09 DIAGNOSIS — Z9981 Dependence on supplemental oxygen: Secondary | ICD-10-CM | POA: Diagnosis not present

## 2017-12-09 DIAGNOSIS — K219 Gastro-esophageal reflux disease without esophagitis: Secondary | ICD-10-CM | POA: Diagnosis not present

## 2017-12-09 DIAGNOSIS — J181 Lobar pneumonia, unspecified organism: Secondary | ICD-10-CM | POA: Diagnosis not present

## 2017-12-11 DIAGNOSIS — K219 Gastro-esophageal reflux disease without esophagitis: Secondary | ICD-10-CM | POA: Diagnosis not present

## 2017-12-11 DIAGNOSIS — R0789 Other chest pain: Secondary | ICD-10-CM | POA: Diagnosis not present

## 2017-12-11 DIAGNOSIS — R2689 Other abnormalities of gait and mobility: Secondary | ICD-10-CM | POA: Diagnosis not present

## 2017-12-11 DIAGNOSIS — Z79899 Other long term (current) drug therapy: Secondary | ICD-10-CM | POA: Diagnosis not present

## 2017-12-11 DIAGNOSIS — J181 Lobar pneumonia, unspecified organism: Secondary | ICD-10-CM | POA: Diagnosis not present

## 2017-12-11 DIAGNOSIS — Z9981 Dependence on supplemental oxygen: Secondary | ICD-10-CM | POA: Diagnosis not present

## 2017-12-12 ENCOUNTER — Other Ambulatory Visit: Payer: Self-pay | Admitting: Pulmonary Disease

## 2017-12-12 ENCOUNTER — Other Ambulatory Visit: Payer: Self-pay | Admitting: Internal Medicine

## 2017-12-16 ENCOUNTER — Inpatient Hospital Stay: Payer: Medicare Other | Admitting: Adult Health

## 2017-12-16 DIAGNOSIS — R2689 Other abnormalities of gait and mobility: Secondary | ICD-10-CM | POA: Diagnosis not present

## 2017-12-16 DIAGNOSIS — Z79899 Other long term (current) drug therapy: Secondary | ICD-10-CM | POA: Diagnosis not present

## 2017-12-16 DIAGNOSIS — J181 Lobar pneumonia, unspecified organism: Secondary | ICD-10-CM | POA: Diagnosis not present

## 2017-12-16 DIAGNOSIS — R0789 Other chest pain: Secondary | ICD-10-CM | POA: Diagnosis not present

## 2017-12-16 DIAGNOSIS — Z9981 Dependence on supplemental oxygen: Secondary | ICD-10-CM | POA: Diagnosis not present

## 2017-12-16 DIAGNOSIS — K219 Gastro-esophageal reflux disease without esophagitis: Secondary | ICD-10-CM | POA: Diagnosis not present

## 2017-12-19 DIAGNOSIS — Z79899 Other long term (current) drug therapy: Secondary | ICD-10-CM | POA: Diagnosis not present

## 2017-12-19 DIAGNOSIS — R2689 Other abnormalities of gait and mobility: Secondary | ICD-10-CM | POA: Diagnosis not present

## 2017-12-19 DIAGNOSIS — Z9981 Dependence on supplemental oxygen: Secondary | ICD-10-CM | POA: Diagnosis not present

## 2017-12-19 DIAGNOSIS — J181 Lobar pneumonia, unspecified organism: Secondary | ICD-10-CM | POA: Diagnosis not present

## 2017-12-19 DIAGNOSIS — R0789 Other chest pain: Secondary | ICD-10-CM | POA: Diagnosis not present

## 2017-12-19 DIAGNOSIS — K219 Gastro-esophageal reflux disease without esophagitis: Secondary | ICD-10-CM | POA: Diagnosis not present

## 2017-12-22 ENCOUNTER — Other Ambulatory Visit: Payer: Self-pay | Admitting: Internal Medicine

## 2017-12-26 ENCOUNTER — Ambulatory Visit (INDEPENDENT_AMBULATORY_CARE_PROVIDER_SITE_OTHER): Payer: Medicare Other | Admitting: Pulmonary Disease

## 2017-12-26 ENCOUNTER — Encounter: Payer: Self-pay | Admitting: Pulmonary Disease

## 2017-12-26 VITALS — BP 118/62 | HR 63 | Ht 68.0 in | Wt 227.6 lb

## 2017-12-26 DIAGNOSIS — J439 Emphysema, unspecified: Secondary | ICD-10-CM | POA: Diagnosis not present

## 2017-12-26 MED ORDER — TIOTROPIUM BROMIDE MONOHYDRATE 2.5 MCG/ACT IN AERS: INHALATION_SPRAY | RESPIRATORY_TRACT | 0 refills | Status: DC

## 2017-12-26 NOTE — Patient Instructions (Signed)
Follow up in 6 months 

## 2017-12-26 NOTE — Progress Notes (Signed)
[facility]  Chief Complaint  Patient presents with  . Follow-up    ER 4/17, PNE. 2L continous, wants to qualify for pulsed. Reports since hospital stay his breathing has been better.     Vital signs: BP 118/62   Pulse 63   Ht 5\' 8"  (1.727 m)   Wt 227 lb 9.6 oz (103.2 kg)   SpO2 98%   BMI 34.61 kg/m   History of Present Illness: Cristian Yang is a 83 y.o. male former smoker with COPD/emphysema and OSA.  In was in hospital in April for pneumonia.  He is feeling better.  Still has occasional cough.  Was sent home with oxygen.  He doesn't think he needs this anymore.  Feels inhalers are working well.  Physical Exam:  General - pleasant Eyes - pupils reactive ENT - no sinus tenderness, no oral exudate, no LAN Cardiac - regular, no murmur Chest - no wheeze, rales Abd - soft, non tender Ext - no edema Skin - no rashes Neuro - normal strength Psych - normal mood  Ambulatory oximetry on room air today >> no oxygen desaturation  Assessment/Plan:  COPD with emphysema and asthma. - continue spiriva, advair, singulair, and prn albuterol  Hypoxia after recent episode of pneumonia. - oxygenation improved - he doesn't need supplemental oxygen at this time  Obstructive sleep apnea. - he is compliant with CPAP and reports benefit from therapy  Upper airway cough with post-nasal drip. - continue nasonex, singulair, prn zyrtec   Patient Instructions  Follow up in 6 months    Chesley Mires, MD [place] 12/26/2017, 3:16 PM  Flow Sheet  Sleep tests: PSG 09/29/02 >> AHI 12, SaO2 low 85%  Pulmonary tests PFT 12/12/14 >> FEV1 1.61 (60%), FEV1% 66, TLC 5.48 (82%), DLCO 47%, no BD  Past Medical History: He  has a past medical history of Arthritis, Chronic back pain, COPD (chronic obstructive pulmonary disease) ([place]), Depression, GERD (gastroesophageal reflux disease), Hyperlipidemia, Hypertension, Hypothyroidism,  Skin cancer, and Sleep apnea.  Past Surgical History: He  has a past surgical history that includes Skin cancer excision (2013); Prostate surgery; Neck surgery; skin cancer removed; and Cataract extraction w/PHACO (Left, 02/13/2016).  Family History: His family history includes Asthma in his father; Bone cancer in his sister; Diabetes in his mother; Heart disease in his mother; Liver cancer in his brother.  Social History: He  reports that he quit smoking about 14 years ago. His smoking use included cigarettes. He has a 60.00 pack-year smoking history. He has never used smokeless tobacco. He reports that he does not drink alcohol or use drugs.  Medications: Allergies as of 12/26/2017      Reactions   Ciprofloxacin Nausea And Vomiting   Codeine Nausea And Vomiting      Medication List        Accurate as of 12/26/17  3:16 PM. Always use your most recent med list.          albuterol (2.5 MG/3ML) 0.083% nebulizer solution Commonly known as:  PROVENTIL USE 1 VIAL (3 ML) VIA NEBULIZER DAILY DX: J44.9   aspirin EC 81 MG tablet Take 81 mg by mouth daily.   citalopram 20 MG tablet Commonly known as:  CELEXA Take 1 tablet (20 mg total) by mouth daily.   docusate sodium 100 MG capsule Commonly known as:  COLACE Take 100 mg by mouth daily.   Fluticasone-Salmeterol 250-50 MCG/DOSE Aepb Commonly known as:  ADVAIR USE 1  INHALATION BY MOUTH EVERY 12 HOURS. <<RINSE MOUTH AFTER USE>>.   gabapentin 300 MG capsule Commonly known as:  NEURONTIN TAKE 1 CAPSULE BY MOUTH IN MORNING AND 2 CAPSULES AT NIGHT   GNP MELATONIN 3 MG Tabs Generic drug:  Melatonin TAKE 1 TABLET BY MOUTH ONE HOUR BEFORE BED   hydrochlorothiazide 25 MG tablet Commonly known as:  HYDRODIURIL Take 1 tablet (25 mg total) by mouth daily.   HYDROcodone-acetaminophen 5-325 MG tablet Commonly known as:  NORCO/VICODIN Take 1 tablet every 6 (six) hours as needed by mouth for moderate pain.   levothyroxine 75 MCG  tablet Commonly known as:  SYNTHROID, LEVOTHROID Take 1 tablet (75 mcg total) by mouth daily before breakfast.   lidocaine 5 % ointment Commonly known as:  XYLOCAINE Apply 1 application topically 3 (three) times daily as needed. Apply to affected area   losartan 100 MG tablet Commonly known as:  COZAAR TAKE 1 TABLET BY MOUTH ONCE DAILY.   MILK OF MAGNESIA PO Take 30 mLs by mouth daily as needed (constipation).   mometasone 50 MCG/ACT nasal spray Commonly known as:  NASONEX Place 2 sprays into the nose daily.   montelukast 10 MG tablet Commonly known as:  SINGULAIR Take 1 tablet (10 mg total) by mouth at bedtime.   multivitamin with minerals Tabs tablet Take 1 tablet by mouth daily.   omeprazole 20 MG capsule Commonly known as:  PRILOSEC Take 1 capsule (20 mg total) by mouth daily.   PRESCRIPTION MEDICATION Patient uses CPAP at night   Tiotropium Bromide Monohydrate 2.5 MCG/ACT Aers Commonly known as:  SPIRIVA RESPIMAT INHALE 2 PUFFS BY MOUTH ONCE DAILY   TUMS ULTRA 1000 400 MG chewable tablet Generic drug:  calcium elemental as carbonate Chew 1,000 mg by mouth 3 (three) times daily as needed for heartburn.

## 2018-01-01 ENCOUNTER — Other Ambulatory Visit: Payer: Self-pay | Admitting: Internal Medicine

## 2018-01-01 DIAGNOSIS — G8929 Other chronic pain: Secondary | ICD-10-CM

## 2018-01-01 DIAGNOSIS — M545 Low back pain: Principal | ICD-10-CM

## 2018-01-01 DIAGNOSIS — E039 Hypothyroidism, unspecified: Secondary | ICD-10-CM

## 2018-01-01 MED ORDER — LEVOTHYROXINE SODIUM 75 MCG PO TABS: 75.0000 ug | ORAL_TABLET | Freq: Every day | ORAL | 0 refills | Status: DC

## 2018-01-01 MED ORDER — HYDROCODONE-ACETAMINOPHEN 5-325 MG PO TABS: 1.0000 | ORAL_TABLET | Freq: Two times a day (BID) | ORAL | 0 refills | Status: DC | PRN

## 2018-01-01 NOTE — Telephone Encounter (Signed)
Saw patient's wife today for an appointment. Mr. Gardella was accompanying her and said he wants to transfer from [facility] to new pain clinic with Mrs. Siebers who was recently dismissed. Will place referral. Patient signed record release. He will be out of hydrocodone-acetaminophen 5-325 mg soon. Reviewed [place] controlled substance database and provided 1 month refill until patient can hopefully establish with new pain center as couple plans to establish with new PCP in [place] that will not provide pain prescriptions. Explained that [facility] cannot consider providing another refill unless he has a visit with [place] if referral is still pending.   Olene Floss, MD [place], PGY-3

## 2018-01-09 ENCOUNTER — Other Ambulatory Visit: Payer: Self-pay | Admitting: Internal Medicine

## 2018-01-09 DIAGNOSIS — F329 Major depressive disorder, single episode, unspecified: Secondary | ICD-10-CM

## 2018-01-09 DIAGNOSIS — F32A Depression, unspecified: Secondary | ICD-10-CM

## 2018-01-12 ENCOUNTER — Other Ambulatory Visit: Payer: Self-pay

## 2018-01-15 ENCOUNTER — Other Ambulatory Visit: Payer: Self-pay | Admitting: Internal Medicine

## 2018-01-15 MED ORDER — HYDROCODONE-ACETAMINOPHEN 5-325 MG PO TABS: 1.0000 | ORAL_TABLET | Freq: Two times a day (BID) | ORAL | 0 refills | Status: DC | PRN

## 2018-01-15 MED ORDER — GABAPENTIN 300 MG PO CAPS: ORAL_CAPSULE | ORAL | 0 refills | Status: DC

## 2018-01-28 DIAGNOSIS — I10 Essential (primary) hypertension: Secondary | ICD-10-CM | POA: Diagnosis not present

## 2018-01-28 DIAGNOSIS — K219 Gastro-esophageal reflux disease without esophagitis: Secondary | ICD-10-CM | POA: Diagnosis not present

## 2018-01-28 DIAGNOSIS — J441 Chronic obstructive pulmonary disease with (acute) exacerbation: Secondary | ICD-10-CM | POA: Diagnosis not present

## 2018-01-28 DIAGNOSIS — K5900 Constipation, unspecified: Secondary | ICD-10-CM | POA: Diagnosis not present

## 2018-01-28 DIAGNOSIS — J302 Other seasonal allergic rhinitis: Secondary | ICD-10-CM | POA: Diagnosis not present

## 2018-01-28 DIAGNOSIS — Z6835 Body mass index (BMI) 35.0-35.9, adult: Secondary | ICD-10-CM | POA: Diagnosis not present

## 2018-01-28 DIAGNOSIS — M545 Low back pain: Secondary | ICD-10-CM | POA: Diagnosis not present

## 2018-01-28 DIAGNOSIS — E039 Hypothyroidism, unspecified: Secondary | ICD-10-CM | POA: Diagnosis not present

## 2018-02-04 ENCOUNTER — Other Ambulatory Visit: Payer: Self-pay | Admitting: *Deleted

## 2018-02-04 ENCOUNTER — Other Ambulatory Visit: Payer: Self-pay | Admitting: Internal Medicine

## 2018-02-04 MED ORDER — LOSARTAN POTASSIUM 100 MG PO TABS: 100.0000 mg | ORAL_TABLET | Freq: Every day | ORAL | 0 refills | Status: DC

## 2018-02-06 ENCOUNTER — Encounter: Payer: Self-pay | Admitting: Internal Medicine

## 2018-02-10 ENCOUNTER — Other Ambulatory Visit: Payer: Self-pay | Admitting: Pulmonary Disease

## 2018-02-18 DIAGNOSIS — Z1321 Encounter for screening for nutritional disorder: Secondary | ICD-10-CM | POA: Diagnosis not present

## 2018-02-18 DIAGNOSIS — Z79899 Other long term (current) drug therapy: Secondary | ICD-10-CM | POA: Diagnosis not present

## 2018-02-18 DIAGNOSIS — R062 Wheezing: Secondary | ICD-10-CM | POA: Diagnosis not present

## 2018-02-18 DIAGNOSIS — R7301 Impaired fasting glucose: Secondary | ICD-10-CM | POA: Diagnosis not present

## 2018-02-18 DIAGNOSIS — I10 Essential (primary) hypertension: Secondary | ICD-10-CM | POA: Diagnosis not present

## 2018-02-18 DIAGNOSIS — Z125 Encounter for screening for malignant neoplasm of prostate: Secondary | ICD-10-CM | POA: Diagnosis not present

## 2018-02-18 DIAGNOSIS — R0600 Dyspnea, unspecified: Secondary | ICD-10-CM | POA: Diagnosis not present

## 2018-02-19 ENCOUNTER — Other Ambulatory Visit: Payer: Self-pay | Admitting: Internal Medicine

## 2018-02-24 DIAGNOSIS — K219 Gastro-esophageal reflux disease without esophagitis: Secondary | ICD-10-CM | POA: Diagnosis not present

## 2018-02-24 DIAGNOSIS — Z0000 Encounter for general adult medical examination without abnormal findings: Secondary | ICD-10-CM | POA: Diagnosis not present

## 2018-02-24 DIAGNOSIS — F331 Major depressive disorder, recurrent, moderate: Secondary | ICD-10-CM | POA: Diagnosis not present

## 2018-02-24 DIAGNOSIS — G4733 Obstructive sleep apnea (adult) (pediatric): Secondary | ICD-10-CM | POA: Diagnosis not present

## 2018-02-24 DIAGNOSIS — M545 Low back pain: Secondary | ICD-10-CM | POA: Diagnosis not present

## 2018-02-24 DIAGNOSIS — E039 Hypothyroidism, unspecified: Secondary | ICD-10-CM | POA: Diagnosis not present

## 2018-02-24 DIAGNOSIS — J302 Other seasonal allergic rhinitis: Secondary | ICD-10-CM | POA: Diagnosis not present

## 2018-02-24 DIAGNOSIS — J449 Chronic obstructive pulmonary disease, unspecified: Secondary | ICD-10-CM | POA: Diagnosis not present

## 2018-02-24 DIAGNOSIS — Z6835 Body mass index (BMI) 35.0-35.9, adult: Secondary | ICD-10-CM | POA: Diagnosis not present

## 2018-02-24 DIAGNOSIS — I10 Essential (primary) hypertension: Secondary | ICD-10-CM | POA: Diagnosis not present

## 2018-03-05 ENCOUNTER — Other Ambulatory Visit: Payer: Self-pay | Admitting: Family Medicine

## 2018-03-05 ENCOUNTER — Other Ambulatory Visit: Payer: Self-pay | Admitting: Internal Medicine

## 2018-03-05 DIAGNOSIS — F329 Major depressive disorder, single episode, unspecified: Secondary | ICD-10-CM

## 2018-03-05 DIAGNOSIS — F32A Depression, unspecified: Secondary | ICD-10-CM

## 2018-03-06 ENCOUNTER — Other Ambulatory Visit: Payer: Self-pay | Admitting: Internal Medicine

## 2018-03-06 DIAGNOSIS — E039 Hypothyroidism, unspecified: Secondary | ICD-10-CM

## 2018-03-09 NOTE — Telephone Encounter (Signed)
Please have patient schedule f/u with me (new PCP) sometime in the next month or so. Thank you.  Harriet Butte, [place], PGY-3

## 2018-03-30 ENCOUNTER — Other Ambulatory Visit: Payer: Self-pay | Admitting: Pulmonary Disease

## 2018-03-30 DIAGNOSIS — J449 Chronic obstructive pulmonary disease, unspecified: Secondary | ICD-10-CM

## 2018-04-02 ENCOUNTER — Other Ambulatory Visit: Payer: Self-pay | Admitting: Family Medicine

## 2018-04-02 DIAGNOSIS — F329 Major depressive disorder, single episode, unspecified: Secondary | ICD-10-CM

## 2018-04-02 DIAGNOSIS — F32A Depression, unspecified: Secondary | ICD-10-CM

## 2018-04-03 ENCOUNTER — Other Ambulatory Visit: Payer: Self-pay | Admitting: Internal Medicine

## 2018-04-10 ENCOUNTER — Ambulatory Visit (HOSPITAL_COMMUNITY)
Admission: RE | Admit: 2018-04-10 | Discharge: 2018-04-10 | Disposition: A | Discharge status: Home / Self Care | Payer: Medicare Other | Source: Ambulatory Visit | Attending: Adult Health Nurse Practitioner | Admitting: Adult Health Nurse Practitioner

## 2018-04-10 ENCOUNTER — Other Ambulatory Visit (HOSPITAL_COMMUNITY): Payer: Self-pay | Admitting: Adult Health Nurse Practitioner

## 2018-04-10 ENCOUNTER — Ambulatory Visit: Payer: Medicare Other | Admitting: Pulmonary Disease

## 2018-04-10 DIAGNOSIS — R05 Cough: Secondary | ICD-10-CM

## 2018-04-10 DIAGNOSIS — J441 Chronic obstructive pulmonary disease with (acute) exacerbation: Secondary | ICD-10-CM | POA: Diagnosis not present

## 2018-04-10 DIAGNOSIS — Z6834 Body mass index (BMI) 34.0-34.9, adult: Secondary | ICD-10-CM | POA: Diagnosis not present

## 2018-04-10 DIAGNOSIS — R918 Other nonspecific abnormal finding of lung field: Secondary | ICD-10-CM | POA: Insufficient documentation

## 2018-04-10 DIAGNOSIS — R059 Cough, unspecified: Secondary | ICD-10-CM

## 2018-04-18 DIAGNOSIS — J449 Chronic obstructive pulmonary disease, unspecified: Secondary | ICD-10-CM | POA: Diagnosis not present

## 2018-04-18 DIAGNOSIS — R55 Syncope and collapse: Secondary | ICD-10-CM | POA: Diagnosis not present

## 2018-04-18 DIAGNOSIS — Z9181 History of falling: Secondary | ICD-10-CM | POA: Diagnosis not present

## 2018-04-18 DIAGNOSIS — Z6835 Body mass index (BMI) 35.0-35.9, adult: Secondary | ICD-10-CM | POA: Diagnosis not present

## 2018-05-01 ENCOUNTER — Encounter: Payer: Self-pay | Admitting: Pulmonary Disease

## 2018-05-01 ENCOUNTER — Other Ambulatory Visit: Payer: Self-pay | Admitting: Family Medicine

## 2018-05-01 ENCOUNTER — Ambulatory Visit (INDEPENDENT_AMBULATORY_CARE_PROVIDER_SITE_OTHER): Payer: Medicare Other | Admitting: Pulmonary Disease

## 2018-05-01 VITALS — BP 104/70 | HR 63 | Ht 68.0 in | Wt 227.0 lb

## 2018-05-01 DIAGNOSIS — Z9989 Dependence on other enabling machines and devices: Secondary | ICD-10-CM | POA: Diagnosis not present

## 2018-05-01 DIAGNOSIS — F32A Depression, unspecified: Secondary | ICD-10-CM

## 2018-05-01 DIAGNOSIS — J439 Emphysema, unspecified: Secondary | ICD-10-CM

## 2018-05-01 DIAGNOSIS — R05 Cough: Secondary | ICD-10-CM | POA: Diagnosis not present

## 2018-05-01 DIAGNOSIS — J449 Chronic obstructive pulmonary disease, unspecified: Secondary | ICD-10-CM

## 2018-05-01 DIAGNOSIS — G4733 Obstructive sleep apnea (adult) (pediatric): Secondary | ICD-10-CM

## 2018-05-01 DIAGNOSIS — R058 Other specified cough: Secondary | ICD-10-CM

## 2018-05-01 DIAGNOSIS — F329 Major depressive disorder, single episode, unspecified: Secondary | ICD-10-CM

## 2018-05-01 DIAGNOSIS — E039 Hypothyroidism, unspecified: Secondary | ICD-10-CM

## 2018-05-01 MED ORDER — FLUTICASONE-UMECLIDIN-VILANT 100-62.5-25 MCG/INH IN AEPB: 1.0000 | INHALATION_SPRAY | Freq: Every day | RESPIRATORY_TRACT | 5 refills | Status: DC

## 2018-05-01 MED ORDER — FLUTICASONE-UMECLIDIN-VILANT 100-62.5-25 MCG/INH IN AEPB: 1.0000 | INHALATION_SPRAY | RESPIRATORY_TRACT | 0 refills | Status: AC

## 2018-05-01 NOTE — Progress Notes (Signed)
[facility]  Chief Complaint  Patient presents with  . Follow-up    Pt having some SOB with exertion, productive cough with some chest tightness.    Constitutional:  BP 104/70 (BP Location: Left Arm, Cuff Size: Normal)   Pulse 63   Ht 5\' 8"  (1.727 m)   Wt 227 lb (103 kg)   SpO2 96%   BMI 34.52 kg/m   Past Medical History:  Chronic back pain, Depression, GERD, HLD, HTN, Hypothyroidism  Brief Summary:  Cristian Yang is a 83 y.o. male COPD with asthma and emphysema, OSA, and rhinitis.  His PCP changed his inhalers.  Now on anoro.  Not feeling as good.  Has more cough, wheeze, and chest congestion.  Not having fever, hemoptysis, chest pain, abdominal pain, or leg swelling.  Using CPAP at night w/o trouble.  Will get flu shot later this month.   Physical Exam:   Appearance - well kempt  ENMT - clear nasal mucosa, midline nasal septum, no oral exudates, no LAN, trachea midline Respiratory - normal chest wall, normal respiratory effort, no accessory muscle use, no wheeze/rales CV - s1s2 regular rate and rhythm, no murmurs, no peripheral edema, radial pulses symmetric GI - soft, non tender, no masses Lymph - no adenopathy noted in neck and axillary areas MSK - normal muscle strength and tone, normal gait Ext - no cyanosis, clubbing, or joint inflammation noted Skin - no rashes, lesions, or ulcers Neuro - oriented to person, place, and time Psych - normal mood and affect   Assessment/Plan:   COPD with emphysema and asthma. - d/c anoro - change to trelegy one puff daily - singulair with prn albuterol  Obstructive sleep apnea. - he reports compliance and benefit from CPAP  Upper airway cough with post-nasal drip. - nasonex, singulair, zyrtec   Patient Instructions  Stop anoro  Trelegy one puff daily, and rinse your mouth after each use  Follow up in 6 months    Chesley Mires, MD [facility] Pager:  [phone] 05/01/2018, 2:32 PM  Flow Sheet     Pulmonary tests:  PFT 12/12/14 >> FEV1 1.61 (60%), FEV1% 66, TLC 5.48 (82%), DLCO 47%, no BD  Sleep tests:  PSG 09/29/02 >> AHI 12, SaO2 low 85%  Cardiac tests:  Echo 11/09/17 >> mild LVH, EF 55 to 60%, mild AS   Medications:   Allergies as of 05/01/2018      Reactions   Ciprofloxacin Nausea And Vomiting   Codeine Nausea And Vomiting      Medication List        Accurate as of 05/01/18  2:32 PM. Always use your most recent med list.          albuterol (2.5 MG/3ML) 0.083% nebulizer solution Commonly known as:  PROVENTIL USE 1 VIAL IN NEBULIZER ONCE DAILY.   aspirin EC 81 MG tablet Take 81 mg by mouth daily.   citalopram 20 MG tablet Commonly known as:  CELEXA TAKE 1 TABLET BY MOUTH ONCE DAILY.   docusate sodium 100 MG capsule Commonly known as:  COLACE TAKE 1 CAPSULE BY MOUTH IN THE MORNING   Fluticasone-Umeclidin-Vilant 100-62.5-25 MCG/INH Aepb Inhale 1 puff into the lungs daily.   gabapentin 300 MG capsule Commonly known as:  NEURONTIN TAKE 1 CAPSULE BY MOUTH IN THE MORNING TAKE 2 CAPSULE IN THE EVENING   GNP MELATONIN 3 MG Tabs Generic drug:  Melatonin TAKE 1 TABLET BY MOUTH ONE HOUR BEFORE BED  hydrochlorothiazide 25 MG tablet Commonly known as:  HYDRODIURIL TAKE 1 TABLET BY MOUTH ONCE DAILY.   HYDROcodone-acetaminophen 5-325 MG tablet Commonly known as:  NORCO/VICODIN Take 1 tablet by mouth 2 (two) times daily as needed for moderate pain.   levothyroxine 75 MCG tablet Commonly known as:  SYNTHROID, LEVOTHROID TAKE 1 TABLET IN THE MORNING BEFORE BREAKFAST   lidocaine 5 % ointment Commonly known as:  XYLOCAINE Apply 1 application topically 3 (three) times daily as needed. Apply to affected area   losartan 100 MG tablet Commonly known as:  COZAAR TAKE 1 TABLET BY MOUTH ONCE DAILY.   MILK OF MAGNESIA PO Take 30 mLs by mouth daily as needed (constipation).   mometasone 50 MCG/ACT nasal  spray Commonly known as:  NASONEX SPRAY 2 SPRAYS INTO EACH NOSTRIL ONCE DAILY   montelukast 10 MG tablet Commonly known as:  SINGULAIR TAKE ONE TABLET BY MOUTH AT BEDTIME.   multivitamin Tabs tablet TAKE 1 TABLET BY MOUTH ONCE DAILY.   omeprazole 20 MG capsule Commonly known as:  PRILOSEC TAKE (1) CAPSULE BY MOUTH ONCE DAILY.   PRESCRIPTION MEDICATION Patient uses CPAP at night   TUMS ULTRA 1000 400 MG chewable tablet Generic drug:  calcium elemental as carbonate Chew 1,000 mg by mouth 3 (three) times daily as needed for heartburn.       Past Surgical History:  He  has a past surgical history that includes Skin cancer excision (2013); Prostate surgery; Neck surgery; skin cancer removed; and Cataract extraction w/PHACO (Left, 02/13/2016).  Family History:  His family history includes Asthma in his father; Bone cancer in his sister; Diabetes in his mother; Heart disease in his mother; Liver cancer in his brother.  Social History:  He  reports that he quit smoking about 14 years ago. His smoking use included cigarettes. He has a 60.00 pack-year smoking history. He has never used smokeless tobacco. He reports that he does not drink alcohol or use drugs.

## 2018-05-01 NOTE — Patient Instructions (Signed)
Stop anoro  Trelegy one puff daily, and rinse your mouth after each use  Follow up in 6 months

## 2018-05-01 NOTE — Addendum Note (Signed)
Addended by: Georjean Mode on: 05/01/2018 02:43 PM   Modules accepted: Orders

## 2018-05-06 DIAGNOSIS — Z6834 Body mass index (BMI) 34.0-34.9, adult: Secondary | ICD-10-CM | POA: Diagnosis not present

## 2018-05-06 DIAGNOSIS — J441 Chronic obstructive pulmonary disease with (acute) exacerbation: Secondary | ICD-10-CM | POA: Diagnosis not present

## 2018-05-12 ENCOUNTER — Telehealth: Payer: Self-pay | Admitting: Pulmonary Disease

## 2018-05-12 NOTE — Telephone Encounter (Signed)
Called and spoke to pt's spouse, Retha(DPR) Retha wanted clarification on inhaler.  Per last OV note on 05/01/18, pt was to stop Anoro and start Trelegy. Rx was sent to [facility] for [place] on 05/01/18. Pt states Rx was delivered for Anoro from [facility].  I have called [facility] and spoke to [place]. I have requested that Anoro be canceled.  Nothing further is needed.

## 2018-05-27 DIAGNOSIS — J449 Chronic obstructive pulmonary disease, unspecified: Secondary | ICD-10-CM | POA: Diagnosis not present

## 2018-05-27 DIAGNOSIS — M542 Cervicalgia: Secondary | ICD-10-CM | POA: Diagnosis not present

## 2018-05-27 DIAGNOSIS — M1990 Unspecified osteoarthritis, unspecified site: Secondary | ICD-10-CM | POA: Diagnosis not present

## 2018-06-02 ENCOUNTER — Other Ambulatory Visit: Payer: Self-pay | Admitting: Family Medicine

## 2018-06-02 DIAGNOSIS — F32A Depression, unspecified: Secondary | ICD-10-CM

## 2018-06-02 DIAGNOSIS — F329 Major depressive disorder, single episode, unspecified: Secondary | ICD-10-CM

## 2018-06-02 DIAGNOSIS — E039 Hypothyroidism, unspecified: Secondary | ICD-10-CM

## 2018-06-10 ENCOUNTER — Other Ambulatory Visit (HOSPITAL_COMMUNITY): Payer: Self-pay | Admitting: Internal Medicine

## 2018-06-10 ENCOUNTER — Encounter (HOSPITAL_COMMUNITY): Payer: Self-pay

## 2018-06-10 ENCOUNTER — Ambulatory Visit (HOSPITAL_COMMUNITY)
Admission: RE | Admit: 2018-06-10 | Discharge: 2018-06-10 | Disposition: A | Discharge status: Home / Self Care | Payer: Medicare Other | Source: Ambulatory Visit | Attending: Internal Medicine | Admitting: Internal Medicine

## 2018-06-10 DIAGNOSIS — M25519 Pain in unspecified shoulder: Secondary | ICD-10-CM

## 2018-06-10 DIAGNOSIS — M25511 Pain in right shoulder: Secondary | ICD-10-CM

## 2018-06-10 DIAGNOSIS — M542 Cervicalgia: Secondary | ICD-10-CM

## 2018-06-11 ENCOUNTER — Ambulatory Visit (HOSPITAL_COMMUNITY)
Admission: RE | Admit: 2018-06-11 | Discharge: 2018-06-11 | Disposition: A | Discharge status: Home / Self Care | Payer: Medicare Other | Source: Ambulatory Visit | Attending: Internal Medicine | Admitting: Internal Medicine

## 2018-06-11 DIAGNOSIS — M542 Cervicalgia: Secondary | ICD-10-CM | POA: Diagnosis not present

## 2018-06-11 DIAGNOSIS — M25511 Pain in right shoulder: Secondary | ICD-10-CM | POA: Diagnosis not present

## 2018-06-11 DIAGNOSIS — M47812 Spondylosis without myelopathy or radiculopathy, cervical region: Secondary | ICD-10-CM | POA: Diagnosis not present

## 2018-06-11 DIAGNOSIS — M47892 Other spondylosis, cervical region: Secondary | ICD-10-CM | POA: Insufficient documentation

## 2018-06-11 DIAGNOSIS — M25519 Pain in unspecified shoulder: Secondary | ICD-10-CM | POA: Diagnosis present

## 2018-06-16 DIAGNOSIS — I10 Essential (primary) hypertension: Secondary | ICD-10-CM | POA: Diagnosis not present

## 2018-06-16 DIAGNOSIS — Z0000 Encounter for general adult medical examination without abnormal findings: Secondary | ICD-10-CM | POA: Diagnosis not present

## 2018-06-16 DIAGNOSIS — J441 Chronic obstructive pulmonary disease with (acute) exacerbation: Secondary | ICD-10-CM | POA: Diagnosis not present

## 2018-06-16 DIAGNOSIS — E039 Hypothyroidism, unspecified: Secondary | ICD-10-CM | POA: Diagnosis not present

## 2018-06-16 DIAGNOSIS — R7301 Impaired fasting glucose: Secondary | ICD-10-CM | POA: Diagnosis not present

## 2018-06-16 DIAGNOSIS — K219 Gastro-esophageal reflux disease without esophagitis: Secondary | ICD-10-CM | POA: Diagnosis not present

## 2018-06-23 DIAGNOSIS — F331 Major depressive disorder, recurrent, moderate: Secondary | ICD-10-CM | POA: Diagnosis not present

## 2018-06-23 DIAGNOSIS — Z23 Encounter for immunization: Secondary | ICD-10-CM | POA: Diagnosis not present

## 2018-06-23 DIAGNOSIS — I10 Essential (primary) hypertension: Secondary | ICD-10-CM | POA: Diagnosis not present

## 2018-06-23 DIAGNOSIS — G4733 Obstructive sleep apnea (adult) (pediatric): Secondary | ICD-10-CM | POA: Diagnosis not present

## 2018-06-23 DIAGNOSIS — J441 Chronic obstructive pulmonary disease with (acute) exacerbation: Secondary | ICD-10-CM | POA: Diagnosis not present

## 2018-06-23 DIAGNOSIS — J302 Other seasonal allergic rhinitis: Secondary | ICD-10-CM | POA: Diagnosis not present

## 2018-06-23 DIAGNOSIS — K210 Gastro-esophageal reflux disease with esophagitis: Secondary | ICD-10-CM | POA: Diagnosis not present

## 2018-06-23 DIAGNOSIS — E039 Hypothyroidism, unspecified: Secondary | ICD-10-CM | POA: Diagnosis not present

## 2018-06-23 DIAGNOSIS — M545 Low back pain: Secondary | ICD-10-CM | POA: Diagnosis not present

## 2018-06-23 DIAGNOSIS — M542 Cervicalgia: Secondary | ICD-10-CM | POA: Diagnosis not present

## 2018-06-30 ENCOUNTER — Other Ambulatory Visit: Payer: Self-pay | Admitting: Internal Medicine

## 2018-07-09 ENCOUNTER — Other Ambulatory Visit: Payer: Self-pay

## 2018-07-09 ENCOUNTER — Inpatient Hospital Stay (HOSPITAL_COMMUNITY)
Admission: EM | Admit: 2018-07-09 | Discharge: 2018-07-11 | DRG: 190 | Disposition: A | Discharge status: Home Health | Payer: Medicare Other | Attending: Internal Medicine | Admitting: Internal Medicine

## 2018-07-09 ENCOUNTER — Encounter (HOSPITAL_COMMUNITY): Payer: Self-pay

## 2018-07-09 ENCOUNTER — Emergency Department (HOSPITAL_COMMUNITY): Payer: Medicare Other

## 2018-07-09 DIAGNOSIS — Z79890 Hormone replacement therapy (postmenopausal): Secondary | ICD-10-CM

## 2018-07-09 DIAGNOSIS — Z961 Presence of intraocular lens: Secondary | ICD-10-CM | POA: Diagnosis present

## 2018-07-09 DIAGNOSIS — G4733 Obstructive sleep apnea (adult) (pediatric): Secondary | ICD-10-CM | POA: Diagnosis present

## 2018-07-09 DIAGNOSIS — T402X1A Poisoning by other opioids, accidental (unintentional), initial encounter: Secondary | ICD-10-CM | POA: Diagnosis present

## 2018-07-09 DIAGNOSIS — W010XXA Fall on same level from slipping, tripping and stumbling without subsequent striking against object, initial encounter: Secondary | ICD-10-CM | POA: Diagnosis present

## 2018-07-09 DIAGNOSIS — S199XXA Unspecified injury of neck, initial encounter: Secondary | ICD-10-CM | POA: Diagnosis not present

## 2018-07-09 DIAGNOSIS — Z9989 Dependence on other enabling machines and devices: Secondary | ICD-10-CM

## 2018-07-09 DIAGNOSIS — Y92009 Unspecified place in unspecified non-institutional (private) residence as the place of occurrence of the external cause: Secondary | ICD-10-CM

## 2018-07-09 DIAGNOSIS — I10 Essential (primary) hypertension: Secondary | ICD-10-CM | POA: Diagnosis present

## 2018-07-09 DIAGNOSIS — M549 Dorsalgia, unspecified: Secondary | ICD-10-CM | POA: Diagnosis present

## 2018-07-09 DIAGNOSIS — J9601 Acute respiratory failure with hypoxia: Secondary | ICD-10-CM | POA: Diagnosis present

## 2018-07-09 DIAGNOSIS — R0902 Hypoxemia: Secondary | ICD-10-CM | POA: Diagnosis not present

## 2018-07-09 DIAGNOSIS — Z7951 Long term (current) use of inhaled steroids: Secondary | ICD-10-CM

## 2018-07-09 DIAGNOSIS — I959 Hypotension, unspecified: Secondary | ICD-10-CM | POA: Diagnosis not present

## 2018-07-09 DIAGNOSIS — E039 Hypothyroidism, unspecified: Secondary | ICD-10-CM | POA: Diagnosis present

## 2018-07-09 DIAGNOSIS — Z7982 Long term (current) use of aspirin: Secondary | ICD-10-CM

## 2018-07-09 DIAGNOSIS — Z8701 Personal history of pneumonia (recurrent): Secondary | ICD-10-CM

## 2018-07-09 DIAGNOSIS — I951 Orthostatic hypotension: Secondary | ICD-10-CM | POA: Diagnosis present

## 2018-07-09 DIAGNOSIS — Z87891 Personal history of nicotine dependence: Secondary | ICD-10-CM

## 2018-07-09 DIAGNOSIS — R42 Dizziness and giddiness: Secondary | ICD-10-CM | POA: Diagnosis present

## 2018-07-09 DIAGNOSIS — R0689 Other abnormalities of breathing: Secondary | ICD-10-CM | POA: Diagnosis not present

## 2018-07-09 DIAGNOSIS — M25531 Pain in right wrist: Secondary | ICD-10-CM | POA: Diagnosis not present

## 2018-07-09 DIAGNOSIS — K219 Gastro-esophageal reflux disease without esophagitis: Secondary | ICD-10-CM | POA: Diagnosis present

## 2018-07-09 DIAGNOSIS — W19XXXA Unspecified fall, initial encounter: Secondary | ICD-10-CM

## 2018-07-09 DIAGNOSIS — Z9841 Cataract extraction status, right eye: Secondary | ICD-10-CM

## 2018-07-09 DIAGNOSIS — Z9114 Patient's other noncompliance with medication regimen: Secondary | ICD-10-CM | POA: Diagnosis not present

## 2018-07-09 DIAGNOSIS — S62101A Fracture of unspecified carpal bone, right wrist, initial encounter for closed fracture: Secondary | ICD-10-CM | POA: Diagnosis not present

## 2018-07-09 DIAGNOSIS — E785 Hyperlipidemia, unspecified: Secondary | ICD-10-CM | POA: Diagnosis present

## 2018-07-09 DIAGNOSIS — E871 Hypo-osmolality and hyponatremia: Secondary | ICD-10-CM

## 2018-07-09 DIAGNOSIS — J441 Chronic obstructive pulmonary disease with (acute) exacerbation: Secondary | ICD-10-CM | POA: Diagnosis not present

## 2018-07-09 DIAGNOSIS — G2581 Restless legs syndrome: Secondary | ICD-10-CM | POA: Diagnosis present

## 2018-07-09 DIAGNOSIS — Z9842 Cataract extraction status, left eye: Secondary | ICD-10-CM | POA: Diagnosis not present

## 2018-07-09 DIAGNOSIS — S52501A Unspecified fracture of the lower end of right radius, initial encounter for closed fracture: Secondary | ICD-10-CM | POA: Diagnosis present

## 2018-07-09 DIAGNOSIS — Z85828 Personal history of other malignant neoplasm of skin: Secondary | ICD-10-CM

## 2018-07-09 DIAGNOSIS — S0990XA Unspecified injury of head, initial encounter: Secondary | ICD-10-CM | POA: Diagnosis not present

## 2018-07-09 DIAGNOSIS — Z885 Allergy status to narcotic agent status: Secondary | ICD-10-CM

## 2018-07-09 DIAGNOSIS — S6991XA Unspecified injury of right wrist, hand and finger(s), initial encounter: Secondary | ICD-10-CM | POA: Diagnosis not present

## 2018-07-09 DIAGNOSIS — M1990 Unspecified osteoarthritis, unspecified site: Secondary | ICD-10-CM | POA: Diagnosis present

## 2018-07-09 DIAGNOSIS — Z825 Family history of asthma and other chronic lower respiratory diseases: Secondary | ICD-10-CM

## 2018-07-09 DIAGNOSIS — T502X5A Adverse effect of carbonic-anhydrase inhibitors, benzothiadiazides and other diuretics, initial encounter: Secondary | ICD-10-CM | POA: Diagnosis present

## 2018-07-09 DIAGNOSIS — F329 Major depressive disorder, single episode, unspecified: Secondary | ICD-10-CM | POA: Diagnosis present

## 2018-07-09 DIAGNOSIS — S5291XA Unspecified fracture of right forearm, initial encounter for closed fracture: Secondary | ICD-10-CM | POA: Diagnosis present

## 2018-07-09 DIAGNOSIS — M79641 Pain in right hand: Secondary | ICD-10-CM | POA: Diagnosis not present

## 2018-07-09 DIAGNOSIS — G8929 Other chronic pain: Secondary | ICD-10-CM | POA: Diagnosis present

## 2018-07-09 DIAGNOSIS — J9600 Acute respiratory failure, unspecified whether with hypoxia or hypercapnia: Secondary | ICD-10-CM | POA: Diagnosis present

## 2018-07-09 DIAGNOSIS — E869 Volume depletion, unspecified: Secondary | ICD-10-CM | POA: Diagnosis present

## 2018-07-09 DIAGNOSIS — Z800 Family history of malignant neoplasm of digestive organs: Secondary | ICD-10-CM

## 2018-07-09 DIAGNOSIS — R4182 Altered mental status, unspecified: Secondary | ICD-10-CM | POA: Diagnosis not present

## 2018-07-09 DIAGNOSIS — J9621 Acute and chronic respiratory failure with hypoxia: Secondary | ICD-10-CM

## 2018-07-09 DIAGNOSIS — Z79899 Other long term (current) drug therapy: Secondary | ICD-10-CM

## 2018-07-09 DIAGNOSIS — Z881 Allergy status to other antibiotic agents status: Secondary | ICD-10-CM

## 2018-07-09 DIAGNOSIS — Z8249 Family history of ischemic heart disease and other diseases of the circulatory system: Secondary | ICD-10-CM

## 2018-07-09 DIAGNOSIS — S299XXA Unspecified injury of thorax, initial encounter: Secondary | ICD-10-CM | POA: Diagnosis not present

## 2018-07-09 DIAGNOSIS — E870 Hyperosmolality and hypernatremia: Secondary | ICD-10-CM | POA: Diagnosis not present

## 2018-07-09 DIAGNOSIS — I4510 Unspecified right bundle-branch block: Secondary | ICD-10-CM | POA: Diagnosis not present

## 2018-07-09 HISTORY — DX: Dependence on other enabling machines and devices: Z99.89

## 2018-07-09 HISTORY — DX: Obstructive sleep apnea (adult) (pediatric): G47.33

## 2018-07-09 HISTORY — DX: Unspecified asthma, uncomplicated: J45.909

## 2018-07-09 LAB — CBC WITH DIFFERENTIAL/PLATELET
Abs Immature Granulocytes: 0.07 10*3/uL (ref 0.00–0.07)
Basophils Absolute: 0 10*3/uL (ref 0.0–0.1)
Basophils Relative: 0 %
Eosinophils Absolute: 0.1 10*3/uL (ref 0.0–0.5)
Eosinophils Relative: 1 %
HCT: 37.4 % — ABNORMAL LOW (ref 39.0–52.0)
HEMOGLOBIN: 12.4 g/dL — AB (ref 13.0–17.0)
Immature Granulocytes: 0 %
LYMPHS ABS: 1 10*3/uL (ref 0.7–4.0)
Lymphocytes Relative: 6 %
MCH: 31.2 pg (ref 26.0–34.0)
MCHC: 33.2 g/dL (ref 30.0–36.0)
MCV: 94 fL (ref 80.0–100.0)
Monocytes Absolute: 2 10*3/uL — ABNORMAL HIGH (ref 0.1–1.0)
Monocytes Relative: 13 %
Neutro Abs: 12.9 10*3/uL — ABNORMAL HIGH (ref 1.7–7.7)
Neutrophils Relative %: 80 %
Platelets: 314 10*3/uL (ref 150–400)
RBC: 3.98 MIL/uL — ABNORMAL LOW (ref 4.22–5.81)
RDW: 13.2 % (ref 11.5–15.5)
WBC: 16.2 10*3/uL — ABNORMAL HIGH (ref 4.0–10.5)
nRBC: 0 % (ref 0.0–0.2)

## 2018-07-09 LAB — URINALYSIS, ROUTINE W REFLEX MICROSCOPIC
Bilirubin Urine: NEGATIVE
GLUCOSE, UA: NEGATIVE mg/dL
Hgb urine dipstick: NEGATIVE
Ketones, ur: NEGATIVE mg/dL
Leukocytes, UA: NEGATIVE
Nitrite: NEGATIVE
Protein, ur: 30 mg/dL — AB
SPECIFIC GRAVITY, URINE: 1.018 (ref 1.005–1.030)
pH: 6 (ref 5.0–8.0)

## 2018-07-09 LAB — RAPID URINE DRUG SCREEN, HOSP PERFORMED
AMPHETAMINES: NOT DETECTED
Barbiturates: NOT DETECTED
Benzodiazepines: NOT DETECTED
Cocaine: NOT DETECTED
Opiates: POSITIVE — AB
Tetrahydrocannabinol: NOT DETECTED

## 2018-07-09 LAB — COMPREHENSIVE METABOLIC PANEL
ALK PHOS: 76 U/L (ref 38–126)
ALT: 16 U/L (ref 0–44)
AST: 21 U/L (ref 15–41)
Albumin: 4.1 g/dL (ref 3.5–5.0)
Anion gap: 8 (ref 5–15)
BUN: 9 mg/dL (ref 8–23)
CO2: 27 mmol/L (ref 22–32)
Calcium: 8.7 mg/dL — ABNORMAL LOW (ref 8.9–10.3)
Chloride: 91 mmol/L — ABNORMAL LOW (ref 98–111)
Creatinine, Ser: 0.87 mg/dL (ref 0.61–1.24)
GFR calc Af Amer: >60 mL/min (ref >60)
GFR calc non Af Amer: >60 mL/min (ref >60)
Glucose, Bld: 134 mg/dL — ABNORMAL HIGH (ref 70–99)
Potassium: 3.7 mmol/L (ref 3.5–5.1)
Sodium: 126 mmol/L — ABNORMAL LOW (ref 135–145)
Total Bilirubin: 0.5 mg/dL (ref 0.3–1.2)
Total Protein: 6.9 g/dL (ref 6.5–8.1)

## 2018-07-09 LAB — LACTIC ACID, PLASMA
Lactic Acid, Venous: 1.7 mmol/L (ref 0.5–1.9)
Lactic Acid, Venous: 1.1 mmol/L (ref 0.5–1.9)

## 2018-07-09 LAB — ETHANOL: Alcohol, Ethyl (B): <10 mg/dL (ref <10)

## 2018-07-09 LAB — TROPONIN I: Troponin I: <0.03 ng/mL (ref <0.03)

## 2018-07-09 MED ORDER — AZITHROMYCIN 250 MG PO TABS
500.0000 mg | ORAL_TABLET | Freq: Every day | ORAL | Status: DC
  Administered 2018-07-10 – 2018-07-11 (×2): 500 mg via ORAL
  Filled 2018-07-09 (×2): qty 2

## 2018-07-09 MED ORDER — ALBUTEROL SULFATE (2.5 MG/3ML) 0.083% IN NEBU: 2.5000 mg | INHALATION_SOLUTION | RESPIRATORY_TRACT | Status: DC | PRN

## 2018-07-09 MED ORDER — IPRATROPIUM BROMIDE 0.02 % IN SOLN
1.0000 mg | Freq: Once | RESPIRATORY_TRACT | Status: AC
  Administered 2018-07-09: 1 mg via RESPIRATORY_TRACT
  Filled 2018-07-09: qty 5

## 2018-07-09 MED ORDER — ASPIRIN EC 81 MG PO TBEC
81.0000 mg | DELAYED_RELEASE_TABLET | Freq: Every day | ORAL | Status: DC
  Administered 2018-07-10 – 2018-07-11 (×2): 81 mg via ORAL
  Filled 2018-07-09 (×3): qty 1

## 2018-07-09 MED ORDER — IPRATROPIUM-ALBUTEROL 0.5-2.5 (3) MG/3ML IN SOLN
3.0000 mL | Freq: Once | RESPIRATORY_TRACT | Status: AC
  Administered 2018-07-09: 3 mL via RESPIRATORY_TRACT
  Filled 2018-07-09: qty 3

## 2018-07-09 MED ORDER — ALBUTEROL (5 MG/ML) CONTINUOUS INHALATION SOLN
10.0000 mg/h | INHALATION_SOLUTION | Freq: Once | RESPIRATORY_TRACT | Status: AC
  Administered 2018-07-09: 10 mg/h via RESPIRATORY_TRACT
  Filled 2018-07-09: qty 20

## 2018-07-09 MED ORDER — METHYLPREDNISOLONE SODIUM SUCC 125 MG IJ SOLR
125.0000 mg | Freq: Once | INTRAMUSCULAR | Status: AC
  Administered 2018-07-09: 125 mg via INTRAVENOUS
  Filled 2018-07-09: qty 2

## 2018-07-09 MED ORDER — SODIUM CHLORIDE 0.9 % IV SOLN
INTRAVENOUS | Status: DC
  Administered 2018-07-09: 20:00:00 via INTRAVENOUS

## 2018-07-09 MED ORDER — SODIUM CHLORIDE 0.9 % IV SOLN
500.0000 mg | INTRAVENOUS | Status: AC
  Administered 2018-07-10: 500 mg via INTRAVENOUS
  Filled 2018-07-09 (×2): qty 500

## 2018-07-09 MED ORDER — SODIUM CHLORIDE 0.9 % IV SOLN
INTRAVENOUS | Status: DC
  Administered 2018-07-10 – 2018-07-11 (×3): via INTRAVENOUS

## 2018-07-09 MED ORDER — IPRATROPIUM-ALBUTEROL 0.5-2.5 (3) MG/3ML IN SOLN: 3.0000 mL | Freq: Four times a day (QID) | RESPIRATORY_TRACT | Status: DC

## 2018-07-09 MED ORDER — METHYLPREDNISOLONE SODIUM SUCC 125 MG IJ SOLR
80.0000 mg | Freq: Two times a day (BID) | INTRAMUSCULAR | Status: DC
  Administered 2018-07-10 – 2018-07-11 (×3): 80 mg via INTRAVENOUS
  Filled 2018-07-09 (×3): qty 2

## 2018-07-09 MED ORDER — ALBUTEROL SULFATE (2.5 MG/3ML) 0.083% IN NEBU
2.5000 mg | INHALATION_SOLUTION | Freq: Once | RESPIRATORY_TRACT | Status: AC
  Administered 2018-07-09: 2.5 mg via RESPIRATORY_TRACT
  Filled 2018-07-09: qty 3

## 2018-07-09 NOTE — ED Notes (Signed)
Pt back from ct

## 2018-07-09 NOTE — H&P (Signed)
History and Physical    Cristian Yang XTK:[id number] DOB: 1934/08/12 DOA: 07/09/2018  PCP: [place] Bing, DO  Patient coming from: Home  Chief Complaint: [place] and hurt right wrist  HPI: Cristian Yang is a 83 y.o. male with medical history significant of chronic back pain, hypertension, COPD, comes in after suffering a fall earlier this morning that was mechanical injuring his right wrist.  When EMS was called they noted that his blood pressure was 64/44 and was given a 500 cc IV fluid bolus this is likely inaccurate as all of his blood pressures have been normal here however he has been orthostatic.  Patient denies any dizziness or weakness upon standing.  He has been coughing and wheezing more for the last 24 hours.  He denies any fevers.  His back has been acting up so he took an extra pain meds prior to his trip and fall which he says may have contributed to him falling.  He denies any chest pain.  He is received a neb treatment in the ED and is feeling better.  He was also noted to be hypoxic 87% on ambulation and so was therefore referred for admission for COPD exacerbation.  Review of Systems: As per HPI otherwise 10 point review of systems negative.   Past Medical History:  Diagnosis Date  . Arthritis   . Asthma   . Chronic back pain   . COPD (chronic obstructive pulmonary disease) ([place])   . Depression   . GERD (gastroesophageal reflux disease)   . Hyperlipidemia   . Hypertension   . Hypothyroidism   . OSA on CPAP   . Skin cancer   . Sleep apnea    uses CPAP nightly    Past Surgical History:  Procedure Laterality Date  . CATARACT EXTRACTION W/PHACO Left 02/13/2016   Procedure: CATARACT EXTRACTION PHACO AND INTRAOCULAR LENS PLACEMENT (IOC);  Surgeon: Rutherford Guys, MD;  Location: [facility];  Service: Ophthalmology;  Laterality: Left;  CDE: 9.23  . NECK SURGERY     cervical disc  . PROSTATE SURGERY     TURP, laser  . SKIN CANCER EXCISION  2013  . skin cancer removed     x3     reports that he quit smoking about 14 years ago. His smoking use included cigarettes. He has a 60.00 pack-year smoking history. He has never used smokeless tobacco. He reports that he does not drink alcohol or use drugs.  Allergies  Allergen Reactions  . Ciprofloxacin Nausea And Vomiting  . Codeine Nausea And Vomiting    Family History  Problem Relation Age of Onset  . Diabetes Mother   . Heart disease Mother   . Liver cancer Brother   . Bone cancer Sister   . Asthma Father     Prior to Admission medications   Medication Sig Start Date End Date Taking? Authorizing Provider  albuterol (PROVENTIL) (2.5 MG/3ML) 0.083% nebulizer solution USE 1 VIAL IN NEBULIZER ONCE DAILY. Patient taking differently: Take 2.5 mg by nebulization daily.  03/30/18  Yes Chesley Mires, MD  calcium elemental as carbonate (TUMS ULTRA 1000) 400 MG chewable tablet Chew 1,000 mg by mouth 3 (three) times daily as needed for heartburn.   Yes [provider]  citalopram (CELEXA) 20 MG tablet TAKE 1 TABLET BY MOUTH ONCE DAILY. Patient taking differently: Take 20 mg by mouth at bedtime.  06/03/18  Yes  Bing, DO  docusate sodium (COLACE) 100 MG capsule TAKE 1 CAPSULE BY MOUTH  IN THE MORNING Patient taking differently: Take 100 mg by mouth every morning.  06/03/18  Yes [place] Bing, DO  Fluticasone-Umeclidin-Vilant (TRELEGY ELLIPTA) 100-62.5-25 MCG/INH AEPB Inhale 1 puff into the lungs daily. 05/01/18  Yes Chesley Mires, MD  gabapentin (NEURONTIN) 300 MG capsule TAKE 1 CAPSULE BY MOUTH IN THE MORNING TAKE 2 CAPSULE IN THE EVENING Patient taking differently: Take 300 mg by mouth 3 (three) times daily.  05/01/18  Yes [place] Bing, DO  GNP ASPIRIN LOW DOSE 81 MG EC tablet TAKE 1 TABLET BY MOUTH ONCE DAILY. Patient taking differently: Take 81 mg by mouth every morning.  06/30/18  Yes McMullen, David J, DO  GNP MELATONIN 3 MG TABS TAKE 1 TABLET BY MOUTH ONE HOUR BEFORE BED Patient taking  differently: Take 3 mg by mouth at bedtime.  06/03/18  Yes [place] Bing, DO  hydrochlorothiazide (HYDRODIURIL) 25 MG tablet TAKE 1 TABLET BY MOUTH ONCE DAILY. Patient taking differently: Take 25 mg by mouth every morning.  06/03/18  Yes [place] Bing, DO  HYDROcodone-acetaminophen (NORCO/VICODIN) 5-325 MG tablet Take 1 tablet by mouth 2 (two) times daily as needed for moderate pain. 01/15/18  Yes [place] Bussing, MD  levothyroxine (SYNTHROID, LEVOTHROID) 75 MCG tablet TAKE 1 TABLET IN THE MORNING BEFORE BREAKFAST Patient taking differently: Take 75 mcg by mouth daily before breakfast.  06/03/18  Yes [place] Bing, DO  lidocaine (XYLOCAINE) 5 % ointment Apply 1 application topically 3 (three) times daily as needed. Apply to affected area 11/21/17  Yes [place], Timothy, DO  losartan (COZAAR) 100 MG tablet TAKE 1 TABLET BY MOUTH ONCE DAILY. Patient taking differently: Take 100 mg by mouth every morning.  06/03/18  Yes [place] Bing, DO  Magnesium Hydroxide (MILK OF MAGNESIA PO) Take 30 mLs by mouth daily as needed (constipation).   Yes [provider]  mometasone (NASONEX) 50 MCG/ACT nasal spray SPRAY 2 SPRAYS INTO EACH NOSTRIL ONCE DAILY Patient taking differently: Place 2 sprays into the nose daily.  02/13/18  Yes Sood, Elisabeth Cara, MD  montelukast (SINGULAIR) 10 MG tablet TAKE ONE TABLET BY MOUTH AT BEDTIME. Patient taking differently: Take 10 mg by mouth at bedtime.  06/03/18  Yes [place] Bing, DO  multivitamin (ONE-A-DAY MEN'S) TABS tablet TAKE 1 TABLET BY MOUTH ONCE DAILY. Patient taking differently: Take 1 tablet by mouth every morning.  06/03/18  Yes [place] Bing, DO  omeprazole (PRILOSEC) 20 MG capsule TAKE (1) CAPSULE BY MOUTH ONCE DAILY. Patient taking differently: Take 20 mg by mouth every morning.  06/03/18  Yes [place] Bing, DO  PRESCRIPTION MEDICATION Patient uses CPAP at night   Yes [provider]    Physical Exam: Vitals:    07/09/18 2230 07/09/18 2250 07/09/18 2257 07/09/18 2300  BP:   (!) 143/55   Pulse: 81  66   Resp:   18   Temp:   (!) 97.3 F (36.3 C)   TempSrc:   Axillary   SpO2: 97% 97% 99%   Weight:    103.6 kg  Height:    5\' 8"  (1.727 m)      Constitutional: NAD, calm, comfortable Vitals:   07/09/18 2230 07/09/18 2250 07/09/18 2257 07/09/18 2300  BP:   (!) 143/55   Pulse: 81  66   Resp:   18   Temp:   (!) 97.3 F (36.3 C)   TempSrc:   Axillary   SpO2: 97% 97% 99%   Weight:    103.6  kg  Height:    5\' 8"  (1.727 m)   Eyes: PERRL, lids and conjunctivae normal ENMT: Mucous membranes are moist. Posterior pharynx clear of any exudate or lesions.Normal dentition.  Neck: normal, supple, no masses, no thyromegaly Respiratory: clear to auscultation bilaterally, mild expiratory wheezing, no crackles. Normal respiratory effort. No accessory muscle use.  Cardiovascular: Regular rate and rhythm, no murmurs / rubs / gallops. No extremity edema. 2+ pedal pulses. No carotid bruits.  Abdomen: no tenderness, no masses palpated. No hepatosplenomegaly. Bowel sounds positive.  Musculoskeletal: no clubbing / cyanosis. No joint deformity upper and lower extremities. Good ROM, no contractures. Normal muscle tone.  Skin: no rashes, lesions, ulcers. No induration Neurologic: CN 2-12 grossly intact. Sensation intact, DTR normal. Strength 5/5 in all 4.  Psychiatric: Normal judgment and insight. Alert and oriented x 3. Normal mood.    Labs on Admission: I have personally reviewed following labs and imaging studies  CBC: Recent Labs  Lab 07/09/18 2002  WBC 16.2*  NEUTROABS 12.9*  HGB 12.4*  HCT 37.4*  MCV 94.0  PLT 784   Basic Metabolic Panel: Recent Labs  Lab 07/09/18 2002  NA 126*  K 3.7  CL 91*  CO2 27  GLUCOSE 134*  BUN 9  CREATININE 0.87  CALCIUM 8.7*   GFR: Estimated Creatinine Clearance: 77.7 mL/min (by C-G formula based on SCr of 0.87 mg/dL). Liver Function Tests: Recent Labs  Lab  07/09/18 2002  AST 21  ALT 16  ALKPHOS 76  BILITOT 0.5  PROT 6.9  ALBUMIN 4.1   No results for input(s): LIPASE, AMYLASE in the last 168 hours. No results for input(s): AMMONIA in the last 168 hours. Coagulation Profile: No results for input(s): INR, PROTIME in the last 168 hours. Cardiac Enzymes: Recent Labs  Lab 07/09/18 2002  TROPONINI <0.03   BNP (last 3 results) No results for input(s): PROBNP in the last 8760 hours. HbA1C: No results for input(s): HGBA1C in the last 72 hours. CBG: No results for input(s): GLUCAP in the last 168 hours. Lipid Profile: No results for input(s): CHOL, HDL, LDLCALC, TRIG, CHOLHDL, LDLDIRECT in the last 72 hours. Thyroid Function Tests: No results for input(s): TSH, T4TOTAL, FREET4, T3FREE, THYROIDAB in the last 72 hours. Anemia Panel: No results for input(s): VITAMINB12, FOLATE, FERRITIN, TIBC, IRON, RETICCTPCT in the last 72 hours. Urine analysis:    Component Value Date/Time   COLORURINE YELLOW 07/09/2018 2114   APPEARANCEUR CLEAR 07/09/2018 2114   LABSPEC 1.018 07/09/2018 2114   PHURINE 6.0 07/09/2018 2114   GLUCOSEU NEGATIVE 07/09/2018 2114   HGBUR NEGATIVE 07/09/2018 2114   [place] NEGATIVE 07/09/2018 2114   [place] NEGATIVE 07/09/2018 2114   PROTEINUR 30 (A) 07/09/2018 2114   NITRITE NEGATIVE 07/09/2018 2114   LEUKOCYTESUR NEGATIVE 07/09/2018 2114   Sepsis Labs: !!!!!!!!!!!!!!!!!!!!!!!!!!!!!!!!!!!!!!!!!!!! @LABRCNTIP (procalcitonin:4,lacticidven:4) )No results found for this or any previous visit (from the past 240 hour(s)).   Radiological Exams on Admission: Dg Chest 2 View  Result Date: 07/09/2018 CLINICAL DATA:  Fall EXAM: CHEST - 2 VIEW COMPARISON:  04/10/2018 FINDINGS: Mild cardiomegaly. No pleural effusion. Aortic atherosclerosis. No pneumothorax. IMPRESSION: No active cardiopulmonary disease.  Mild cardiomegaly. Electronically Signed   By: Donavan Foil M.D.   On: 07/09/2018 20:22   Dg Wrist Complete  Right  Result Date: 07/09/2018 CLINICAL DATA:  Fall with wrist pain EXAM: RIGHT WRIST - COMPLETE 3+ VIEW COMPARISON:  None. FINDINGS: Widened scapholunate interval up to 6 mm. Arthritis at the first [facility] joint. No dislocation. Lucency  at the articular surface of the distal radius on the scaphoid view. Soft tissue swelling at the wrist. IMPRESSION: 1. Findings suspicious for nondisplaced intra-articular distal radius fracture 2. Widened scapholunate interval, consistent with ligamentous injury. Electronically Signed   By: Donavan Foil M.D.   On: 07/09/2018 20:21   Ct Head Wo Contrast  Result Date: 07/09/2018 CLINICAL DATA:  Hand pain following a fall. EXAM: CT HEAD WITHOUT CONTRAST CT CERVICAL SPINE WITHOUT CONTRAST TECHNIQUE: Multidetector CT imaging of the head and cervical spine was performed following the standard protocol without intravenous contrast. Multiplanar CT image reconstructions of the cervical spine were also generated. COMPARISON:  Cervical spine radiographs dated 06/11/2018. Head CT dated 11/05/2017. FINDINGS: CT HEAD FINDINGS Brain: Mild-to-moderate diffuse enlargement of the ventricles and subarachnoid spaces without significant change. Stable moderate patchy white matter low density in both cerebral hemispheres. No intracranial hemorrhage, mass lesion or CT evidence of acute infarction. Vascular: No hyperdense vessel or unexpected calcification. Skull: Normal. Negative for fracture or focal lesion. Sinuses/Orbits: Status post bilateral cataract extraction. Moderate bilateral ethmoid and sphenoid sinus mucosal thickening. Mild-to-moderate bilateral inferior frontal sinus mucosal thickening. Other: None. CT CERVICAL SPINE FINDINGS Alignment: Stable grade 1 anterolisthesis at the C3-4, C4-5 and C7-T1 levels. Skull base and vertebrae: No acute fracture. No primary bone lesion or focal pathologic process. Soft tissues and spinal canal: No prevertebral fluid or swelling. No visible canal  hematoma. Disc levels: Stable multilevel degenerative changes, including facet degenerative changes throughout the cervical spine. Stable solid interbody bone and screw and plate fusion at the C5 through C7 levels. Upper chest: Clear lung apices. Other: Mild bilateral carotid artery calcifications. IMPRESSION: 1. No skull fracture or intracranial hemorrhage. 2. No cervical spine fracture or traumatic subluxation. 3. Stable mild to moderate diffuse cerebral and cerebellar atrophy. 4. Stable moderate chronic small vessel white matter ischemic changes in both cerebral hemispheres. 5. Stable multilevel cervical spine degenerative changes and C5 through C7 fusion. 6. Mild bilateral carotid artery atheromatous calcifications. Electronically Signed   By: Claudie Revering M.D.   On: 07/09/2018 21:47   Ct Cervical Spine Wo Contrast  Result Date: 07/09/2018 CLINICAL DATA:  Hand pain following a fall. EXAM: CT HEAD WITHOUT CONTRAST CT CERVICAL SPINE WITHOUT CONTRAST TECHNIQUE: Multidetector CT imaging of the head and cervical spine was performed following the standard protocol without intravenous contrast. Multiplanar CT image reconstructions of the cervical spine were also generated. COMPARISON:  Cervical spine radiographs dated 06/11/2018. Head CT dated 11/05/2017. FINDINGS: CT HEAD FINDINGS Brain: Mild-to-moderate diffuse enlargement of the ventricles and subarachnoid spaces without significant change. Stable moderate patchy white matter low density in both cerebral hemispheres. No intracranial hemorrhage, mass lesion or CT evidence of acute infarction. Vascular: No hyperdense vessel or unexpected calcification. Skull: Normal. Negative for fracture or focal lesion. Sinuses/Orbits: Status post bilateral cataract extraction. Moderate bilateral ethmoid and sphenoid sinus mucosal thickening. Mild-to-moderate bilateral inferior frontal sinus mucosal thickening. Other: None. CT CERVICAL SPINE FINDINGS Alignment: Stable grade 1  anterolisthesis at the C3-4, C4-5 and C7-T1 levels. Skull base and vertebrae: No acute fracture. No primary bone lesion or focal pathologic process. Soft tissues and spinal canal: No prevertebral fluid or swelling. No visible canal hematoma. Disc levels: Stable multilevel degenerative changes, including facet degenerative changes throughout the cervical spine. Stable solid interbody bone and screw and plate fusion at the C5 through C7 levels. Upper chest: Clear lung apices. Other: Mild bilateral carotid artery calcifications. IMPRESSION: 1. No skull fracture or intracranial hemorrhage. 2. No cervical spine  fracture or traumatic subluxation. 3. Stable mild to moderate diffuse cerebral and cerebellar atrophy. 4. Stable moderate chronic small vessel white matter ischemic changes in both cerebral hemispheres. 5. Stable multilevel cervical spine degenerative changes and C5 through C7 fusion. 6. Mild bilateral carotid artery atheromatous calcifications. Electronically Signed   By: Claudie Revering M.D.   On: 07/09/2018 21:47   Dg Hand Complete Right  Result Date: 07/09/2018 CLINICAL DATA:  Fall EXAM: RIGHT HAND - COMPLETE 3+ VIEW COMPARISON:  None. FINDINGS: No acute fracture or malalignment within the bones of the right hand. Moderate arthritis at the first [facility] joint. DIP and PIP arthritis. Possible foreign body projecting over the soft tissues between the first and second digits. IMPRESSION: No acute osseous abnormality within the bones of the right hand. See separately dictated wrist report. Possible small foreign body within the soft tissues between the first and second digits. Electronically Signed   By: Donavan Foil M.D.   On: 07/09/2018 20:20    Old chart reviewed Case discussed with Dr. Thurnell Garbe in the ED Chest x-ray reviewed no edema or infiltrate  Assessment/Plan 83 year old male with a mechanical fall with injury to his right wrist with also mild COPD exacerbation Principal Problem:   COPD  exacerbation (HCC)-placed on IV Solu-Medrol frequent nebs supplemental oxygen as needed no need for antibiotics at this time  Active Problems:   OSA on CPAP-noted    Hypertension-holding blood pressure meds at this time due to report from EMS.  He is mildly orthostatic in the ED.  Repeat orthostasis every shift    Acute respiratory failure (HCC)-hypoxic with ambulation 87% on room air.  Continue above treatment for COPD and reevaluate prior to discharge does not require home oxygen treatment    Orthostatic hypotension-gentle IV fluids overnight repeat orthostatics in the morning holding blood pressure meds at this time    Closed right radial fracture-we will need orthopedic follow-up as outpatient    Chronic back pain-hold sedatives overnight at this time.     DVT prophylaxis: SCDs Code Status: Full Family Communication: None Disposition Plan: 1 to 3 days Consults called: None Admission status: Admission   DAVID,RACHAL A MD [facility]  If 7PM-7AM, please contact night-coverage [email or web address] Password [email or web address]  07/09/2018, 11:11 PM

## 2018-07-09 NOTE — ED Provider Notes (Signed)
[facility] EMERGENCY DEPARTMENT Provider Note   CSN: [id number] Arrival date & time: 07/09/18  1900     History   Chief Complaint Chief Complaint  Patient presents with  . Fall    HPI Cristian Yang is a 83 y.o. male.  HPI Pt was seen at [facility]. Per EMS and pt report, c/o sudden onset and resolution of one episode of near syncope that occurred PTA. Pt states he tripped and fell over a stepladder this afternoon approximately 1300, landing on his backside and right wrist. Pt c/o right wrist pain since the fall. Pt states he "took an extra pain pill" approximately 1500. States an hour or 2 after that, he felt flushed and lightheaded. EMS states pt was diaphoretic and pale on their arrival to scene, BP was "64/44." EMS gave IVF NS 574ml bolus with improvement of BP and pt's symptoms. Pt only c/o cough "for a few days" and right hand "pain" since falling this afternoon. Denies any other injuries. Denies focal motor weakness, no tingling/numbness in extremities, no fevers, no rash, no CP/palpitations, no abd pain, no N/V/D, no back or neck pain, no syncope.    Past Medical History:  Diagnosis Date  . Arthritis   . Asthma   . Chronic back pain   . COPD (chronic obstructive pulmonary disease) ([place])   . Depression   . GERD (gastroesophageal reflux disease)   . Hyperlipidemia   . Hypertension   . Hypothyroidism   . OSA on CPAP   . Skin cancer   . Sleep apnea    uses CPAP nightly    Patient Active Problem List   Diagnosis Date Noted  . Pneumonia 11/27/2017  . Cough 11/26/2017  . Acute respiratory failure ([place]) 11/05/2017  . Medication refill 12/24/2016  . Right ankle pain 12/09/2016  . Ganglion cyst 09/21/2016  . Restless leg 09/21/2016  . Chronic obstructive pulmonary disease ([place]) 04/12/2015  . OSA on CPAP 06/02/2014  . GERD (gastroesophageal reflux disease) 06/02/2014  . Hyperlipidemia 06/02/2014  . Arthritis 06/02/2014  . Hypertension 06/02/2014    Past Surgical  History:  Procedure Laterality Date  . CATARACT EXTRACTION W/PHACO Left 02/13/2016   Procedure: CATARACT EXTRACTION PHACO AND INTRAOCULAR LENS PLACEMENT (IOC);  Surgeon: Rutherford Guys, MD;  Location: [facility];  Service: Ophthalmology;  Laterality: Left;  CDE: 9.23  . NECK SURGERY     cervical disc  . PROSTATE SURGERY     TURP, laser  . SKIN CANCER EXCISION  2013  . skin cancer removed     x3        Home Medications    Prior to Admission medications   Medication Sig Start Date End Date Taking? Authorizing Provider  albuterol (PROVENTIL) (2.5 MG/3ML) 0.083% nebulizer solution USE 1 VIAL IN NEBULIZER ONCE DAILY. 03/30/18   Chesley Mires, MD  calcium elemental as carbonate (TUMS ULTRA 1000) 400 MG chewable tablet Chew 1,000 mg by mouth 3 (three) times daily as needed for heartburn.    [provider]  citalopram (CELEXA) 20 MG tablet TAKE 1 TABLET BY MOUTH ONCE DAILY. 06/03/18   [place]andel[place] Bing, DO  docusate sodium (COLACE) 100 MG capsule TAKE 1 CAPSULE BY MOUTH IN THE MORNING 06/03/18   [place] Bing, DO  Fluticasone-Umeclidin-Vilant (TRELEGY ELLIPTA) 100-62.5-25 MCG/INH AEPB Inhale 1 puff into the lungs daily. 05/01/18   Chesley Mires, MD  gabapentin (NEURONTIN) 300 MG capsule TAKE 1 CAPSULE BY MOUTH IN THE MORNING TAKE 2 CAPSULE IN THE EVENING 05/01/18  Shelley Bing, DO  GNP ASPIRIN LOW DOSE 81 MG EC tablet TAKE 1 TABLET BY MOUTH ONCE DAILY. 06/30/18   [place] Bing, DO  GNP MELATONIN 3 MG TABS TAKE 1 TABLET BY MOUTH ONE HOUR BEFORE BED 06/03/18   [place] Bing, DO  hydrochlorothiazide (HYDRODIURIL) 25 MG tablet TAKE 1 TABLET BY MOUTH ONCE DAILY. 06/03/18   [place] Bing, DO  HYDROcodone-acetaminophen (NORCO/VICODIN) 5-325 MG tablet Take 1 tablet by mouth 2 (two) times daily as needed for moderate pain. 01/15/18   [place] Bussing, MD  levothyroxine (SYNTHROID, LEVOTHROID) 75 MCG tablet TAKE 1 TABLET IN THE MORNING BEFORE BREAKFAST 06/03/18   [place] Bing, DO  lidocaine (XYLOCAINE) 5 % ointment Apply 1 application topically 3 (three) times daily as needed. Apply to affected area 11/21/17   [place], Timothy, DO  losartan (COZAAR) 100 MG tablet TAKE 1 TABLET BY MOUTH ONCE DAILY. 06/03/18   [place] Bing, DO  Magnesium Hydroxide (MILK OF MAGNESIA PO) Take 30 mLs by mouth daily as needed (constipation).    [provider]  mometasone (NASONEX) 50 MCG/ACT nasal spray SPRAY 2 SPRAYS INTO EACH NOSTRIL ONCE DAILY 02/13/18   Chesley Mires, MD  montelukast (SINGULAIR) 10 MG tablet TAKE ONE TABLET BY MOUTH AT BEDTIME. 06/03/18   [place] Bing, DO  multivitamin (ONE-A-DAY MEN'S) TABS tablet TAKE 1 TABLET BY MOUTH ONCE DAILY. 06/03/18   [place] Bing, DO  omeprazole (PRILOSEC) 20 MG capsule TAKE (1) CAPSULE BY MOUTH ONCE DAILY. 06/03/18   [place] Bing, DO  PRESCRIPTION MEDICATION Patient uses CPAP at night    [provider]    Family History Family History  Problem Relation Age of Onset  . Diabetes Mother   . Heart disease Mother   . Liver cancer Brother   . Bone cancer Sister   . Asthma Father     Social History Social History   Tobacco Use  . Smoking status: Former Smoker    Packs/day: 2.00    Years: 30.00    Pack years: 60.00    Types: Cigarettes    Last attempt to quit: 07/23/2003    Years since quitting: 14.9  . Smokeless tobacco: Never Used  Substance Use Topics  . Alcohol use: No    Alcohol/week: 0.0 standard drinks    Comment: quit 2005  . Drug use: No     Allergies   Ciprofloxacin and Codeine   Review of Systems Review of Systems ROS: Statement: All systems negative except as marked or noted in the HPI; Constitutional: Negative for fever and chills. ; ; Eyes: Negative for eye pain, redness and discharge. ; ; ENMT: Negative for ear pain, hoarseness, nasal congestion, sinus pressure and sore throat. ; ; Cardiovascular: Negative for chest pain, palpitations, diaphoresis, dyspnea and  peripheral edema. ; ; Respiratory: +cough. Negative for wheezing and stridor. ; ; Gastrointestinal: Negative for nausea, vomiting, diarrhea, abdominal pain, blood in stool, hematemesis, jaundice and rectal bleeding. . ; ; Genitourinary: Negative for dysuria, flank pain and hematuria. ; ; Musculoskeletal: +right hand pain. Negative for back pain and neck pain. Negative for deformity..; ; Skin: Negative for pruritus, rash, abrasions, blisters, bruising and skin lesion.; ; Neuro: +lightheadedness. Negative for headache and neck stiffness. Negative for weakness, altered level of consciousness, altered mental status, extremity weakness, paresthesias, involuntary movement, seizure and syncope.       Physical Exam Updated Vital Signs BP 121/67   Pulse 60   Temp 98 F (  36.7 C) (Oral)   Resp 15   Ht 5\' 8"  (1.727 m)   Wt 102.5 kg   SpO2 96%   BMI 34.36 kg/m    19:24 Orthostatic Vital Signs AC  Orthostatic Lying   BP- Lying: 159/89  Pulse- Lying: 58      Orthostatic Sitting  BP- Sitting: 130/70  Pulse- Sitting: 64      Orthostatic Standing at 0 minutes  BP- Standing at 0 minutes: 113/65  Pulse- Standing at 0 minutes: 65     Physical Exam 1923: Physical examination:  Nursing notes reviewed; Vital signs and O2 SAT reviewed;  Constitutional: Well developed, Well nourished, Well hydrated, In no acute distress; Head:  Normocephalic, atraumatic; Eyes: EOMI, PERRL, No scleral icterus; ENMT: Mouth and pharynx normal, Mucous membranes moist; Neck: Supple, Full range of motion, No lymphadenopathy; Cardiovascular: Regular rate and rhythm, No gallop; Respiratory: Breath sounds diminished & equal bilaterally, scattered wheezes. No audible wheezing. Speaking full sentences with ease, Normal respiratory effort/excursion; Chest: Nontender, Movement normal; Abdomen: Soft, Nontender, Nondistended, Normal bowel sounds; Genitourinary: No CVA tenderness; Spine:  No midline CS, TS, LS tenderness.;; Extremities:  Peripheral pulses normal, +generalized TTP right wrist with localized edema, no open wounds, no erythema, no ecchymosis, no deformity. NT right hand/elbow/shoulder. Pelvis stable. No calf edema or asymmetry.; Neuro: AA&Ox3, Major CN grossly intact. No facial droop. Speech clear. No gross focal motor or sensory deficits in extremities.; Skin: Color normal, Warm, Dry.    ED Treatments / Results  Labs (all labs ordered are listed, but only abnormal results are displayed)   EKG EKG Interpretation  Date/Time:  Thursday July 09 2018 19:05:05 EST Ventricular Rate:  58 PR Interval:    QRS Duration: 174 QT Interval:  474 QTC Calculation: 466 R Axis:   -66 Text Interpretation:  Sinus rhythm RBBB and LAFB When compared with ECG of 11/06/2017 No significant change was found Confirmed by Francine Graven [phone]) on 07/09/2018 7:55:02 PM   Radiology   Procedures Procedures (including critical care time)  Medications Ordered in ED Medications  0.9 %  sodium chloride infusion ( Intravenous New Bag/Given 07/09/18 2014)  ipratropium-albuterol (DUONEB) 0.5-2.5 (3) MG/3ML nebulizer solution 3 mL (has no administration in time range)  albuterol (PROVENTIL) (2.5 MG/3ML) 0.083% nebulizer solution 2.5 mg (has no administration in time range)     Initial Impression / Assessment and Plan / ED Course  I have reviewed the triage vital signs and the nursing notes.  Pertinent labs & imaging results that were available during my care of the patient were reviewed by me and considered in my medical decision making (see chart for details).  MDM Reviewed: previous chart, nursing note and vitals Reviewed previous: labs and ECG Interpretation: labs, ECG, x-ray and CT scan Total time providing critical care: 30-74 minutes. This excludes time spent performing separately reportable procedures and services. Consults: admitting MD   CRITICAL CARE Performed by: Francine Graven Total critical care time:  35 minutes Critical care time was exclusive of separately billable procedures and treating other patients. Critical care was necessary to treat or prevent imminent or life-threatening deterioration. Critical care was time spent personally by me on the following activities: development of treatment plan with patient and/or surrogate as well as nursing, discussions with consultants, evaluation of patient's response to treatment, examination of patient, obtaining history from patient or surrogate, ordering and performing treatments and interventions, ordering and review of laboratory studies, ordering and review of radiographic studies, pulse oximetry and re-evaluation of patient's  condition.   Results for orders placed or performed during the hospital encounter of 07/09/18  CBC with Differential  Result Value Ref Range   WBC 16.2 (H) 4.0 - 10.5 K/uL   RBC 3.98 (L) 4.22 - 5.81 MIL/uL   Hemoglobin 12.4 (L) 13.0 - 17.0 g/dL   HCT 37.4 (L) 39.0 - 52.0 %   MCV 94.0 80.0 - 100.0 fL   MCH 31.2 26.0 - 34.0 pg   MCHC 33.2 30.0 - 36.0 g/dL   RDW 13.2 11.5 - 15.5 %   Platelets 314 150 - 400 K/uL   nRBC 0.0 0.0 - 0.2 %   Neutrophils Relative % 80 %   Neutro Abs 12.9 (H) 1.7 - 7.7 K/uL   Lymphocytes Relative 6 %   Lymphs Abs 1.0 0.7 - 4.0 K/uL   Monocytes Relative 13 %   Monocytes Absolute 2.0 (H) 0.1 - 1.0 K/uL   Eosinophils Relative 1 %   Eosinophils Absolute 0.1 0.0 - 0.5 K/uL   Basophils Relative 0 %   Basophils Absolute 0.0 0.0 - 0.1 K/uL   Immature Granulocytes 0 %   Abs Immature Granulocytes 0.07 0.00 - 0.07 K/uL   Dg Chest 2 View Result Date: 07/09/2018 CLINICAL DATA:  Fall EXAM: CHEST - 2 VIEW COMPARISON:  04/10/2018 FINDINGS: Mild cardiomegaly. No pleural effusion. Aortic atherosclerosis. No pneumothorax. IMPRESSION: No active cardiopulmonary disease.  Mild cardiomegaly. Electronically Signed   By: Donavan Foil M.D.   On: 07/09/2018 20:22   Dg Wrist Complete Right Result Date:  07/09/2018 CLINICAL DATA:  Fall with wrist pain EXAM: RIGHT WRIST - COMPLETE 3+ VIEW COMPARISON:  None. FINDINGS: Widened scapholunate interval up to 6 mm. Arthritis at the first [facility] joint. No dislocation. Lucency at the articular surface of the distal radius on the scaphoid view. Soft tissue swelling at the wrist. IMPRESSION: 1. Findings suspicious for nondisplaced intra-articular distal radius fracture 2. Widened scapholunate interval, consistent with ligamentous injury. Electronically Signed   By: Donavan Foil M.D.   On: 07/09/2018 20:21   Dg Hand Complete Right Result Date: 07/09/2018 CLINICAL DATA:  Fall EXAM: RIGHT HAND - COMPLETE 3+ VIEW COMPARISON:  None. FINDINGS: No acute fracture or malalignment within the bones of the right hand. Moderate arthritis at the first [facility] joint. DIP and PIP arthritis. Possible foreign body projecting over the soft tissues between the first and second digits. IMPRESSION: No acute osseous abnormality within the bones of the right hand. See separately dictated wrist report. Possible small foreign body within the soft tissues between the first and second digits. Electronically Signed   By: Donavan Foil M.D.   On: 07/09/2018 20:20   Ct Head Wo Contrast Result Date: 07/09/2018 CLINICAL DATA:  Hand pain following a fall. EXAM: CT HEAD WITHOUT CONTRAST CT CERVICAL SPINE WITHOUT CONTRAST TECHNIQUE: Multidetector CT imaging of the head and cervical spine was performed following the standard protocol without intravenous contrast. Multiplanar CT image reconstructions of the cervical spine were also generated. COMPARISON:  Cervical spine radiographs dated 06/11/2018. Head CT dated 11/05/2017. FINDINGS: CT HEAD FINDINGS Brain: Mild-to-moderate diffuse enlargement of the ventricles and subarachnoid spaces without significant change. Stable moderate patchy white matter low density in both cerebral hemispheres. No intracranial hemorrhage, mass lesion or CT evidence of acute  infarction. Vascular: No hyperdense vessel or unexpected calcification. Skull: Normal. Negative for fracture or focal lesion. Sinuses/Orbits: Status post bilateral cataract extraction. Moderate bilateral ethmoid and sphenoid sinus mucosal thickening. Mild-to-moderate bilateral inferior frontal sinus mucosal thickening. Other:  None. CT CERVICAL SPINE FINDINGS Alignment: Stable grade 1 anterolisthesis at the C3-4, C4-5 and C7-T1 levels. Skull base and vertebrae: No acute fracture. No primary bone lesion or focal pathologic process. Soft tissues and spinal canal: No prevertebral fluid or swelling. No visible canal hematoma. Disc levels: Stable multilevel degenerative changes, including facet degenerative changes throughout the cervical spine. Stable solid interbody bone and screw and plate fusion at the C5 through C7 levels. Upper chest: Clear lung apices. Other: Mild bilateral carotid artery calcifications. IMPRESSION: 1. No skull fracture or intracranial hemorrhage. 2. No cervical spine fracture or traumatic subluxation. 3. Stable mild to moderate diffuse cerebral and cerebellar atrophy. 4. Stable moderate chronic small vessel white matter ischemic changes in both cerebral hemispheres. 5. Stable multilevel cervical spine degenerative changes and C5 through C7 fusion. 6. Mild bilateral carotid artery atheromatous calcifications. Electronically Signed   By: Claudie Revering M.D.   On: 07/09/2018 21:47   Ct Cervical Spine Wo Contrast Result Date: 07/09/2018 CLINICAL DATA:  Hand pain following a fall. EXAM: CT HEAD WITHOUT CONTRAST CT CERVICAL SPINE WITHOUT CONTRAST TECHNIQUE: Multidetector CT imaging of the head and cervical spine was performed following the standard protocol without intravenous contrast. Multiplanar CT image reconstructions of the cervical spine were also generated. COMPARISON:  Cervical spine radiographs dated 06/11/2018. Head CT dated 11/05/2017. FINDINGS: CT HEAD FINDINGS Brain: Mild-to-moderate  diffuse enlargement of the ventricles and subarachnoid spaces without significant change. Stable moderate patchy white matter low density in both cerebral hemispheres. No intracranial hemorrhage, mass lesion or CT evidence of acute infarction. Vascular: No hyperdense vessel or unexpected calcification. Skull: Normal. Negative for fracture or focal lesion. Sinuses/Orbits: Status post bilateral cataract extraction. Moderate bilateral ethmoid and sphenoid sinus mucosal thickening. Mild-to-moderate bilateral inferior frontal sinus mucosal thickening. Other: None. CT CERVICAL SPINE FINDINGS Alignment: Stable grade 1 anterolisthesis at the C3-4, C4-5 and C7-T1 levels. Skull base and vertebrae: No acute fracture. No primary bone lesion or focal pathologic process. Soft tissues and spinal canal: No prevertebral fluid or swelling. No visible canal hematoma. Disc levels: Stable multilevel degenerative changes, including facet degenerative changes throughout the cervical spine. Stable solid interbody bone and screw and plate fusion at the C5 through C7 levels. Upper chest: Clear lung apices. Other: Mild bilateral carotid artery calcifications. IMPRESSION: 1. No skull fracture or intracranial hemorrhage. 2. No cervical spine fracture or traumatic subluxation. 3. Stable mild to moderate diffuse cerebral and cerebellar atrophy. 4. Stable moderate chronic small vessel white matter ischemic changes in both cerebral hemispheres. 5. Stable multilevel cervical spine degenerative changes and C5 through C7 fusion. 6. Mild bilateral carotid artery atheromatous calcifications. Electronically Signed   By: Claudie Revering M.D.   On: 07/09/2018 21:47    2040:  XR as above. No open wounds or FB visualized or palpated right hand as questioned above. Will place splint/sling and will need Ortho MD f/u.   2215:  Short neb given for wheezing and coughing with improvement in wheezing. Sats at rest 94% R/A.  Pt ambulated in hallway: pt's O2 Sats  dropped to 87 % R/A with pt c/o increasing SOB, with increasing coughing and wheezing. Pt escorted back to stretcher with O2 Sats slowly increasing to 94-96 % on R/A. IV solumedrol and hour long neb started. New hyponatremia on labs; judicious IVF given. T/C returned from [facility]. Shanon Brow, case discussed, including:  HPI, pertinent PM/SHx, VS/PE, dx testing, ED course and treatment:  Agreeable to admit.      Final Clinical Impressions(s) /  ED Diagnoses   Final diagnoses:  None    ED Discharge Orders    None       Francine Graven, DO 07/10/18 2327

## 2018-07-09 NOTE — ED Notes (Signed)
Pt sitting in chair.

## 2018-07-09 NOTE — ED Notes (Signed)
Pt in ct/xr. Lab notified of blood draw

## 2018-07-09 NOTE — ED Notes (Signed)
resp in room  

## 2018-07-09 NOTE — ED Triage Notes (Signed)
Pt came from home. Wife called ems for a fall. Complains of right hand pain. When ems arrived bp 64/44 pt was pale and diaphoretic. Denies hitting head or loc. Just hand.  568ml given 16l ac.

## 2018-07-09 NOTE — ED Notes (Signed)
Pt unable to provide urine sample at this time. Notified about in and out cath order.

## 2018-07-09 NOTE — ED Notes (Signed)
[place] wife

## 2018-07-09 NOTE — ED Notes (Signed)
Pt voided. Urine sent to lab.

## 2018-07-09 NOTE — ED Notes (Signed)
Pt ambulated outside of room. Oxygen saturation decreased to 87% from 92% on room air while ambulating. 80bpm.  Witnessed by edp.

## 2018-07-09 NOTE — ED Notes (Signed)
Wife updated

## 2018-07-09 NOTE — ED Notes (Addendum)
Pt thinks he might have taken an extra pain pill on an empty stomach

## 2018-07-10 DIAGNOSIS — J9621 Acute and chronic respiratory failure with hypoxia: Secondary | ICD-10-CM

## 2018-07-10 DIAGNOSIS — J441 Chronic obstructive pulmonary disease with (acute) exacerbation: Principal | ICD-10-CM

## 2018-07-10 DIAGNOSIS — J9601 Acute respiratory failure with hypoxia: Secondary | ICD-10-CM

## 2018-07-10 DIAGNOSIS — I10 Essential (primary) hypertension: Secondary | ICD-10-CM

## 2018-07-10 DIAGNOSIS — I951 Orthostatic hypotension: Secondary | ICD-10-CM

## 2018-07-10 DIAGNOSIS — S62101A Fracture of unspecified carpal bone, right wrist, initial encounter for closed fracture: Secondary | ICD-10-CM

## 2018-07-10 LAB — CBC WITH DIFFERENTIAL/PLATELET
Abs Immature Granulocytes: 0.05 10*3/uL (ref 0.00–0.07)
BASOS ABS: 0 10*3/uL (ref 0.0–0.1)
Basophils Relative: 0 %
Eosinophils Absolute: 0 10*3/uL (ref 0.0–0.5)
Eosinophils Relative: 0 %
HCT: 34.5 % — ABNORMAL LOW (ref 39.0–52.0)
Hemoglobin: 11.7 g/dL — ABNORMAL LOW (ref 13.0–17.0)
IMMATURE GRANULOCYTES: 0 %
Lymphocytes Relative: 3 %
Lymphs Abs: 0.4 10*3/uL — ABNORMAL LOW (ref 0.7–4.0)
MCH: 31.5 pg (ref 26.0–34.0)
MCHC: 33.9 g/dL (ref 30.0–36.0)
MCV: 92.7 fL (ref 80.0–100.0)
Monocytes Absolute: 0.3 10*3/uL (ref 0.1–1.0)
Monocytes Relative: 2 %
NRBC: 0 % (ref 0.0–0.2)
Neutro Abs: 11.5 10*3/uL — ABNORMAL HIGH (ref 1.7–7.7)
Neutrophils Relative %: 95 %
Platelets: 299 10*3/uL (ref 150–400)
RBC: 3.72 MIL/uL — ABNORMAL LOW (ref 4.22–5.81)
RDW: 13.2 % (ref 11.5–15.5)
WBC: 12.2 10*3/uL — ABNORMAL HIGH (ref 4.0–10.5)

## 2018-07-10 LAB — BASIC METABOLIC PANEL
Anion gap: 8 (ref 5–15)
BUN: 11 mg/dL (ref 8–23)
CO2: 25 mmol/L (ref 22–32)
Calcium: 8.9 mg/dL (ref 8.9–10.3)
Chloride: 97 mmol/L — ABNORMAL LOW (ref 98–111)
Creatinine, Ser: 0.82 mg/dL (ref 0.61–1.24)
GFR calc Af Amer: >60 mL/min (ref >60)
GFR calc non Af Amer: >60 mL/min (ref >60)
Glucose, Bld: 156 mg/dL — ABNORMAL HIGH (ref 70–99)
POTASSIUM: 3.7 mmol/L (ref 3.5–5.1)
SODIUM: 130 mmol/L — AB (ref 135–145)

## 2018-07-10 MED ORDER — GABAPENTIN 300 MG PO CAPS
300.0000 mg | ORAL_CAPSULE | Freq: Three times a day (TID) | ORAL | Status: DC
  Administered 2018-07-10 – 2018-07-11 (×3): 300 mg via ORAL
  Filled 2018-07-10 (×3): qty 1

## 2018-07-10 MED ORDER — IPRATROPIUM-ALBUTEROL 0.5-2.5 (3) MG/3ML IN SOLN
3.0000 mL | Freq: Two times a day (BID) | RESPIRATORY_TRACT | Status: DC
  Administered 2018-07-10 – 2018-07-11 (×2): 3 mL via RESPIRATORY_TRACT
  Filled 2018-07-10 (×2): qty 3

## 2018-07-10 MED ORDER — BUDESONIDE 0.5 MG/2ML IN SUSP
0.5000 mg | Freq: Two times a day (BID) | RESPIRATORY_TRACT | Status: DC
  Administered 2018-07-10 – 2018-07-11 (×2): 0.5 mg via RESPIRATORY_TRACT
  Filled 2018-07-10 (×2): qty 2

## 2018-07-10 MED ORDER — MONTELUKAST SODIUM 10 MG PO TABS
10.0000 mg | ORAL_TABLET | Freq: Every day | ORAL | Status: DC
  Administered 2018-07-10: 10 mg via ORAL
  Filled 2018-07-10: qty 1

## 2018-07-10 MED ORDER — LEVOTHYROXINE SODIUM 75 MCG PO TABS
75.0000 ug | ORAL_TABLET | Freq: Every day | ORAL | Status: DC
  Administered 2018-07-10 – 2018-07-11 (×2): 75 ug via ORAL
  Filled 2018-07-10 (×2): qty 1

## 2018-07-10 MED ORDER — HYDROCODONE-ACETAMINOPHEN 5-325 MG PO TABS: 1.0000 | ORAL_TABLET | Freq: Four times a day (QID) | ORAL | Status: DC | PRN

## 2018-07-10 MED ORDER — IPRATROPIUM-ALBUTEROL 0.5-2.5 (3) MG/3ML IN SOLN
3.0000 mL | Freq: Three times a day (TID) | RESPIRATORY_TRACT | Status: DC
  Administered 2018-07-10: 3 mL via RESPIRATORY_TRACT
  Filled 2018-07-10: qty 3

## 2018-07-10 MED ORDER — CITALOPRAM HYDROBROMIDE 20 MG PO TABS
20.0000 mg | ORAL_TABLET | Freq: Every day | ORAL | Status: DC
  Administered 2018-07-10 – 2018-07-11 (×2): 20 mg via ORAL
  Filled 2018-07-10 (×2): qty 1

## 2018-07-10 MED ORDER — PANTOPRAZOLE SODIUM 40 MG PO TBEC
40.0000 mg | DELAYED_RELEASE_TABLET | Freq: Every day | ORAL | Status: DC
  Administered 2018-07-10 – 2018-07-11 (×2): 40 mg via ORAL
  Filled 2018-07-10 (×2): qty 1

## 2018-07-10 NOTE — Progress Notes (Signed)
Oxygen Sats Room Air at rest: 97%  Oxygen Sats Room Air ambulating: 93%

## 2018-07-10 NOTE — Care Management Note (Signed)
Case Management Note  Patient Details  Name: Cristian Yang MRN: [id number] Date of Birth: 1934-10-09  Subjective/Objective:       COPD . From home with wife. On room air currently, no home oxygen.  Independent. Reports being very active. Has RW for long distances. Still drives. Has PCP. No issues with obtain medications. Seen by PT , no needs. OT eval pending. Patient has wirst injury from tripping over a ladder.          Action/Plan: DC home.   Expected Discharge Date:      07/11/2018            Expected Discharge Plan:  Home/Self Care  In-House Referral:     Discharge planning Services  CM Consult  Post Acute Care Choice:  NA Choice offered to:  NA  DME Arranged:    DME Agency:     HH Arranged:    HH Agency:     Status of Service:  Completed, signed off  If discussed at [employer] of Stay Meetings, dates discussed:    Additional Comments:  Bali Lyn, Chauncey [place], RN 07/10/2018, 12:21 PM

## 2018-07-10 NOTE — Progress Notes (Signed)
Initial Nutrition Assessment  DOCUMENTATION CODES:   Obesity unspecified  INTERVENTION:  Low Sodium diet (2 gr) per MD  Provide nutrition education   NUTRITION DIAGNOSIS:   Increased nutrient needs related to acute illness(acute COPD exacerbation) as evidenced by estimated needs.  GOAL:   Patient will meet greater than or equal to 90% of their needs  MONITOR:  PO intake, Labs, Weight trends  REASON FOR ASSESSMENT:   Consult Assessment of nutrition requirement/status  ASSESSMENT: Patient is an 83 yo male with history of COPD, GERD, Depression, HLD and HTN. He presents s/p fall with painful right wrist and found to hypoxic-COPD exacerbation. Hyponatremia.     Meal Completion: currently 75% meals. Patient affirms good appetite without acute changes and able to feed himself. Independent with acquiring and preparing food. He consistently eats 3 meals daily. Beverages are usually diet sprite or coffee and take a daily MVI.  Patient weight history review confirms stable status- usual range 103 kg > 6 months. He weight rose as high as 107-110 kg earlier in the year.  Patient self-reports body weight of 225-226 lb last MD visit.  Medications reviewed and include: Protonix, prednisone, synthroid   Labs: Hyponatremia BMP Latest Ref Rng & Units 07/10/2018 07/09/2018 11/13/2017  Glucose 70 - 99 mg/dL 156(H) 134(H) 114(H)  BUN 8 - 23 mg/dL 11 9 22(H)  Creatinine 0.61 - 1.24 mg/dL 0.82 0.87 0.77  BUN/Creat Ratio 10 - 24 - - -  Sodium 135 - 145 mmol/L 130(L) 126(L) 134(L)  Potassium 3.5 - 5.1 mmol/L 3.7 3.7 4.0  Chloride 98 - 111 mmol/L 97(L) 91(L) 95(L)  CO2 22 - 32 mmol/L 25 27 29   Calcium 8.9 - 10.3 mg/dL 8.9 8.7(L) 8.5(L)     NUTRITION - FOCUSED PHYSICAL EXAM: WDL   Diet Order:   Diet Order            Diet 2 gram sodium Room service appropriate? Yes; Fluid consistency: Thin  Diet effective now              EDUCATION NEEDS:  No education needs have been identified at  this time   Skin:  Skin Assessment: Reviewed RN Assessment  Last BM:  12/19  Height:   Ht Readings from Last 1 Encounters:  07/09/18 5\' 8"  (1.727 m)    Weight:   Wt Readings from Last 1 Encounters:  07/09/18 103.6 kg    Ideal Body Weight:  70 kg  BMI:  Body mass index is 34.73 kg/m.  Estimated Nutritional Needs:   Kcal:  8916-9450 (20-21 kcal/kg/bw)  Protein:  105-119 gr (1.5-1.7 gr/kg/ibw)  Fluid:  2.1-2.2 liters daily   Colman Cater MS,RD,CSG,LDN Office: [phone] Pager: [phone]

## 2018-07-10 NOTE — Progress Notes (Signed)
PROGRESS NOTE  Cristian Yang:[id number] DOB: 1934/10/25 DOA: 07/09/2018 PCP: [place], DO  Brief History:  83 year old male with history of chronic back pain, COPD, hypertension, hyperlipidemia, hypothyroidism presenting after mechanical fall after tripping over a ladder falling onto his right wrist.  Apparently, when EMS was called the patient was noted to have a blood pressure of 64/44.  He stated that because of his back pain he had taken an extra hydrocodone earlier in the day which he had the insight to discern that this may have resulted in his falling.  The patient denied syncope but said that he had had some dizziness.  He denies hitting his head or any other injuries.  Further evaluation revealed that the patient has been short of breath for the past 3 days with increasing cough.  Here complaining of a "chest cold" for the past 3 to 4 days causing increasing cough with yellow sputum.  In the emergency department, the patient was noted to have oxygen saturation of 87% with ambulation and wheezing.  The patient received nebulizer treatment and steroids.  Because of COPD exacerbation, the patient was admitted for further evaluation and treatment  Assessment/Plan: Acute respiratory failure with hypoxia -Initially on nasal cannula oxygen -Has been weaned off -Ambulatory pulse oximetry on room air  COPD exacerbation -Start Pulmicort -Continue duo nebs -Continue Solu-Medrol  Orthostatic hypotension -Patient was noted to have positive orthostatics -Continue IV fluids -Repeat orthostatics  Hyponatremia -Likely due to volume depletion as well was HCTZ -Holding HCTZ -Continue IV fluids -Presented with sodium 126  Closed right radial fracture -X-ray of the right wrist revealed a distal radius fracture that is nondisplaced -Splint was placed in the emergency department -Patient will follow-up with outpatient orthopedics -Judicious pain control  Essential  hypertension -Holding HCTZ and monitor blood pressure  Hypothyroidism -Continue Synthroid  Depression -Continue Celexa    Disposition Plan:   Home 12/21 if stable Family Communication: No  Family at bedside  Consultants:  none  Code Status:  FULL   DVT Prophylaxis:  lovenox   Procedures: As Listed in Progress Note Above  Antibiotics: Azithromycin       Subjective: Patient feels that he is breathing somewhat better.  He denies any nausea, vomiting, diarrhea, abdominal pain, chest pain, fevers, chills, headache, neck pain.  He still has some shortness of breath with exertion.  Objective: Vitals:   07/09/18 2257 07/09/18 2300 07/10/18 0634 07/10/18 0752  BP: (!) 143/55  (!) 143/70   Pulse: 66  69   Resp: 18  20   Temp: (!) 97.3 F (36.3 C)  97.8 F (36.6 C)   TempSrc: Axillary  Oral   SpO2: 99%  94% 91%  Weight:  103.6 kg    Height:  5\' 8"  (1.727 m)      Intake/Output Summary (Last 24 hours) at 07/10/2018 1126 Last data filed at 07/10/2018 0900 Gross per 24 hour  Intake 1292.07 ml  Output 500 ml  Net 792.07 ml   Weight change:  Exam:   General:  Pt is alert, follows commands appropriately, not in acute distress  HEENT: No icterus, No thrush, No neck mass, [place]/AT  Cardiovascular: RRR, S1/S2, no rubs, no gallops  Respiratory: Bibasilar rales.  Minimal basilar wheeze.  Good air movement.  Abdomen: Soft/+BS, non tender, non distended, no guarding  Extremities: No edema, No lymphangitis, No petechiae, No rashes, no synovitis   Data Reviewed: I have personally  reviewed following labs and imaging studies Basic Metabolic Panel: Recent Labs  Lab 07/09/18 2002 07/10/18 0442  NA 126* 130*  K 3.7 3.7  CL 91* 97*  CO2 27 25  GLUCOSE 134* 156*  BUN 9 11  CREATININE 0.87 0.82  CALCIUM 8.7* 8.9   Liver Function Tests: Recent Labs  Lab 07/09/18 2002  AST 21  ALT 16  ALKPHOS 76  BILITOT 0.5  PROT 6.9  ALBUMIN 4.1   No results for  input(s): LIPASE, AMYLASE in the last 168 hours. No results for input(s): AMMONIA in the last 168 hours. Coagulation Profile: No results for input(s): INR, PROTIME in the last 168 hours. CBC: Recent Labs  Lab 07/09/18 2002 07/10/18 0442  WBC 16.2* 12.2*  NEUTROABS 12.9* 11.5*  HGB 12.4* 11.7*  HCT 37.4* 34.5*  MCV 94.0 92.7  PLT 314 299   Cardiac Enzymes: Recent Labs  Lab 07/09/18 2002  TROPONINI <0.03   BNP: Invalid input(s): POCBNP CBG: No results for input(s): GLUCAP in the last 168 hours. HbA1C: No results for input(s): HGBA1C in the last 72 hours. Urine analysis:    Component Value Date/Time   COLORURINE YELLOW 07/09/2018 2114   APPEARANCEUR CLEAR 07/09/2018 2114   LABSPEC 1.018 07/09/2018 2114   PHURINE 6.0 07/09/2018 2114   GLUCOSEU NEGATIVE 07/09/2018 2114   HGBUR NEGATIVE 07/09/2018 2114   [place] NEGATIVE 07/09/2018 2114   [place] NEGATIVE 07/09/2018 2114   PROTEINUR 30 (A) 07/09/2018 2114   NITRITE NEGATIVE 07/09/2018 2114   LEUKOCYTESUR NEGATIVE 07/09/2018 2114   Sepsis Labs: @LABRCNTIP (procalcitonin:4,lacticidven:4) )No results found for this or any previous visit (from the past 240 hour(s)).   Scheduled Meds: . aspirin EC  81 mg Oral Daily  . azithromycin  500 mg Oral Daily  . ipratropium-albuterol  3 mL Nebulization BID  . methylPREDNISolone (SOLU-MEDROL) injection  80 mg Intravenous Q12H   Continuous Infusions: . sodium chloride 75 mL/hr at 07/10/18 3382    Procedures/Studies: Dg Chest 2 View  Result Date: 07/09/2018 CLINICAL DATA:  Fall EXAM: CHEST - 2 VIEW COMPARISON:  04/10/2018 FINDINGS: Mild cardiomegaly. No pleural effusion. Aortic atherosclerosis. No pneumothorax. IMPRESSION: No active cardiopulmonary disease.  Mild cardiomegaly. Electronically Signed   By: Donavan Foil M.D.   On: 07/09/2018 20:22   Dg Cervical Spine Complete  Result Date: 06/11/2018 CLINICAL DATA:  Neck pain after fall 2 months ago. EXAM: CERVICAL SPINE  - COMPLETE 4+ VIEW COMPARISON:  MRI of March 22, 2016. FINDINGS: Grade 1 anterolisthesis of C3-4 and C4-5 is noted secondary to posterior facet joint hypertrophy. Status post surgical anterior fusion of C5-6 and C6-7. No definite fracture is noted. No prevertebral soft tissue swelling is noted. IMPRESSION: Postsurgical and degenerative changes as described above. No acute abnormality seen in the cervical spine. Electronically Signed   By: Marijo Conception, M.D.   On: 06/11/2018 16:49   Dg Scapula Right  Result Date: 06/11/2018 CLINICAL DATA:  Right shoulder pain. EXAM: RIGHT SCAPULA - 2+ VIEWS COMPARISON:  None. FINDINGS: There is no evidence of fracture or other focal bone lesions. Soft tissues are unremarkable. IMPRESSION: Negative. Electronically Signed   By: Marijo Conception, M.D.   On: 06/11/2018 16:50   Dg Wrist Complete Right  Result Date: 07/09/2018 CLINICAL DATA:  Fall with wrist pain EXAM: RIGHT WRIST - COMPLETE 3+ VIEW COMPARISON:  None. FINDINGS: Widened scapholunate interval up to 6 mm. Arthritis at the first [employer] joint. No dislocation. Lucency at the articular surface of the distal  radius on the scaphoid view. Soft tissue swelling at the wrist. IMPRESSION: 1. Findings suspicious for nondisplaced intra-articular distal radius fracture 2. Widened scapholunate interval, consistent with ligamentous injury. Electronically Signed   By: Donavan Foil M.D.   On: 07/09/2018 20:21   Ct Head Wo Contrast  Result Date: 07/09/2018 CLINICAL DATA:  Hand pain following a fall. EXAM: CT HEAD WITHOUT CONTRAST CT CERVICAL SPINE WITHOUT CONTRAST TECHNIQUE: Multidetector CT imaging of the head and cervical spine was performed following the standard protocol without intravenous contrast. Multiplanar CT image reconstructions of the cervical spine were also generated. COMPARISON:  Cervical spine radiographs dated 06/11/2018. Head CT dated 11/05/2017. FINDINGS: CT HEAD FINDINGS Brain: Mild-to-moderate diffuse  enlargement of the ventricles and subarachnoid spaces without significant change. Stable moderate patchy white matter low density in both cerebral hemispheres. No intracranial hemorrhage, mass lesion or CT evidence of acute infarction. Vascular: No hyperdense vessel or unexpected calcification. Skull: Normal. Negative for fracture or focal lesion. Sinuses/Orbits: Status post bilateral cataract extraction. Moderate bilateral ethmoid and sphenoid sinus mucosal thickening. Mild-to-moderate bilateral inferior frontal sinus mucosal thickening. Other: None. CT CERVICAL SPINE FINDINGS Alignment: Stable grade 1 anterolisthesis at the C3-4, C4-5 and C7-T1 levels. Skull base and vertebrae: No acute fracture. No primary bone lesion or focal pathologic process. Soft tissues and spinal canal: No prevertebral fluid or swelling. No visible canal hematoma. Disc levels: Stable multilevel degenerative changes, including facet degenerative changes throughout the cervical spine. Stable solid interbody bone and screw and plate fusion at the C5 through C7 levels. Upper chest: Clear lung apices. Other: Mild bilateral carotid artery calcifications. IMPRESSION: 1. No skull fracture or intracranial hemorrhage. 2. No cervical spine fracture or traumatic subluxation. 3. Stable mild to moderate diffuse cerebral and cerebellar atrophy. 4. Stable moderate chronic small vessel white matter ischemic changes in both cerebral hemispheres. 5. Stable multilevel cervical spine degenerative changes and C5 through C7 fusion. 6. Mild bilateral carotid artery atheromatous calcifications. Electronically Signed   By: Claudie Revering M.D.   On: 07/09/2018 21:47   Ct Cervical Spine Wo Contrast  Result Date: 07/09/2018 CLINICAL DATA:  Hand pain following a fall. EXAM: CT HEAD WITHOUT CONTRAST CT CERVICAL SPINE WITHOUT CONTRAST TECHNIQUE: Multidetector CT imaging of the head and cervical spine was performed following the standard protocol without intravenous  contrast. Multiplanar CT image reconstructions of the cervical spine were also generated. COMPARISON:  Cervical spine radiographs dated 06/11/2018. Head CT dated 11/05/2017. FINDINGS: CT HEAD FINDINGS Brain: Mild-to-moderate diffuse enlargement of the ventricles and subarachnoid spaces without significant change. Stable moderate patchy white matter low density in both cerebral hemispheres. No intracranial hemorrhage, mass lesion or CT evidence of acute infarction. Vascular: No hyperdense vessel or unexpected calcification. Skull: Normal. Negative for fracture or focal lesion. Sinuses/Orbits: Status post bilateral cataract extraction. Moderate bilateral ethmoid and sphenoid sinus mucosal thickening. Mild-to-moderate bilateral inferior frontal sinus mucosal thickening. Other: None. CT CERVICAL SPINE FINDINGS Alignment: Stable grade 1 anterolisthesis at the C3-4, C4-5 and C7-T1 levels. Skull base and vertebrae: No acute fracture. No primary bone lesion or focal pathologic process. Soft tissues and spinal canal: No prevertebral fluid or swelling. No visible canal hematoma. Disc levels: Stable multilevel degenerative changes, including facet degenerative changes throughout the cervical spine. Stable solid interbody bone and screw and plate fusion at the C5 through C7 levels. Upper chest: Clear lung apices. Other: Mild bilateral carotid artery calcifications. IMPRESSION: 1. No skull fracture or intracranial hemorrhage. 2. No cervical spine fracture or traumatic subluxation. 3. Stable mild  to moderate diffuse cerebral and cerebellar atrophy. 4. Stable moderate chronic small vessel white matter ischemic changes in both cerebral hemispheres. 5. Stable multilevel cervical spine degenerative changes and C5 through C7 fusion. 6. Mild bilateral carotid artery atheromatous calcifications. Electronically Signed   By: Claudie Revering M.D.   On: 07/09/2018 21:47   Dg Hand Complete Right  Result Date: 07/09/2018 CLINICAL DATA:   Fall EXAM: RIGHT HAND - COMPLETE 3+ VIEW COMPARISON:  None. FINDINGS: No acute fracture or malalignment within the bones of the right hand. Moderate arthritis at the first [facility] joint. DIP and PIP arthritis. Possible foreign body projecting over the soft tissues between the first and second digits. IMPRESSION: No acute osseous abnormality within the bones of the right hand. See separately dictated wrist report. Possible small foreign body within the soft tissues between the first and second digits. Electronically Signed   By: Donavan Foil M.D.   On: 07/09/2018 20:20    Orson Eva, DO  [facility] Pager [phone]  If 7PM-7AM, please contact night-coverage [email or web address] Password [email or web address] 07/10/2018, 11:26 AM   LOS: 1 day

## 2018-07-10 NOTE — Evaluation (Signed)
Physical Therapy Evaluation Patient Details Name: Cristian Yang MRN: [id number] DOB: 17-Jan-1935 Today's Date: 07/10/2018   History of Present Illness  Cristian Yang is a 83 y.o. male with medical history significant of chronic back pain, hypertension, COPD, comes in after suffering a fall earlier this morning that was mechanical injuring his right wrist.  When EMS was called they noted that his blood pressure was 64/44 and was given a 500 cc IV fluid bolus this is likely inaccurate as all of his blood pressures have been normal here however he has been orthostatic.  Patient denies any dizziness or weakness upon standing.  He has been coughing and wheezing more for the last 24 hours.  He denies any fevers.  His back has been acting up so he took an extra pain meds prior to his trip and fall which he says may have contributed to him falling.  He denies any chest pain.  He is received a neb treatment in the ED and is feeling better.  He was also noted to be hypoxic 87% on ambulation and so was therefore referred for admission for COPD exacerbation.    Clinical Impression  Patient functioning near baseline for functional mobility and gait other than limited use of RUE due to possible fracture and presents with right wrist/hand in splint, other than that patient can complete functional activities and ambulate with no problem.  Plan:  Patient discharged from physical therapy to care of nursing for ambulation daily as tolerated for length of stay.     Follow Up Recommendations Outpatient PT(for RUE when OK'd by ortho MD)    Equipment Recommendations       Recommendations for Other Services       Precautions / Restrictions Precautions Precautions: None Restrictions Weight Bearing Restrictions: No      Mobility  Bed Mobility Overal bed mobility: Modified Independent             General bed mobility comments: increased time  Transfers Overall transfer level: Modified independent               General transfer comment: increased time  Ambulation/Gait Ambulation/Gait assistance: Modified independent (Device/Increase time) Gait Distance (Feet): 150 Feet Assistive device: None Gait Pattern/deviations: WFL(Within Functional Limits) Gait velocity: near normal   General Gait Details: demonstrates good return without loss of balance on level surfaces, up/down ramps  Stairs            Wheelchair Mobility    Modified Rankin (Stroke Patients Only)       Balance Overall balance assessment: No apparent balance deficits (not formally assessed)                                           Pertinent Vitals/Pain Pain Assessment: No/denies pain    Home Living Family/patient expects to be discharged to:: Private residence Living Arrangements: Spouse/significant other Available Help at Discharge: Neighbor;Available PRN/intermittently Type of Home: Mobile home Home Access: Ramped entrance     Home Layout: One level Home Equipment: [place] - 2 wheels;Shower seat;Bedside commode;Wheelchair - power      Prior Function Level of Independence: Independent               Hand Dominance   Dominant Hand: Right    Extremity/Trunk Assessment   Upper Extremity Assessment Upper Extremity Assessment: Overall WFL for tasks assessed;RUE deficits/detail RUE Deficits /  Details: hand, wrist in splint, 4+/5 from elbow to shoulder RUE: Unable to fully assess due to immobilization    Lower Extremity Assessment Lower Extremity Assessment: Overall WFL for tasks assessed    Cervical / Trunk Assessment Cervical / Trunk Assessment: Normal  Communication   Communication: No difficulties  Cognition Arousal/Alertness: Awake/alert Behavior During Therapy: WFL for tasks assessed/performed Overall Cognitive Status: Within Functional Limits for tasks assessed                                        General Comments      Exercises      Assessment/Plan    PT Assessment All further PT needs can be met in the next venue of care  PT Problem List Decreased range of motion;Decreased strength(RUE due to fracture)       PT Treatment Interventions      PT Goals (Current goals can be found in the Care Plan section)  Acute Rehab PT Goals Patient Stated Goal: return home PT Goal Formulation: With patient Time For Goal Achievement: 07/10/18 Potential to Achieve Goals: Good    Frequency     Barriers to discharge        Co-evaluation               AM-PAC PT "6 Clicks" Mobility  Outcome Measure                  End of Session   Activity Tolerance: Patient tolerated treatment well Patient left: in chair;with call bell/phone within reach Nurse Communication: Mobility status      Time: 1010-1038 PT Time Calculation (min) (ACUTE ONLY): 28 min   Charges:   PT Evaluation $PT Eval Moderate Complexity: 1 Mod PT Treatments $Therapeutic Activity: 23-37 mins        2:42 PM, 07/10/18 Lonell Grandchild, MPT Physical Therapist with [facility] [phone] office [phone] mobile phone

## 2018-07-10 NOTE — Progress Notes (Signed)
Pt arrived to room 302. Pt alert and oriented with no complications noted. Oriented to room and call bell at this time. Will complete admission. Call bell within reach. Urinal given and instructed on use. Bed alarm activated. Will monitor.

## 2018-07-10 NOTE — Progress Notes (Signed)
Patient has Hx of sleep apnea and wears CPAP. He is already asleep at this note. Will get him CPAP if he awakens and needs it.

## 2018-07-11 LAB — URINE CULTURE: Culture: NO GROWTH

## 2018-07-11 LAB — MAGNESIUM: Magnesium: 2.1 mg/dL (ref 1.7–2.4)

## 2018-07-11 LAB — BASIC METABOLIC PANEL
Anion gap: 8 (ref 5–15)
BUN: 14 mg/dL (ref 8–23)
CO2: 24 mmol/L (ref 22–32)
Calcium: 8.6 mg/dL — ABNORMAL LOW (ref 8.9–10.3)
Chloride: 101 mmol/L (ref 98–111)
Creatinine, Ser: 0.63 mg/dL (ref 0.61–1.24)
GFR calc Af Amer: >60 mL/min (ref >60)
GFR calc non Af Amer: >60 mL/min (ref >60)
Glucose, Bld: 136 mg/dL — ABNORMAL HIGH (ref 70–99)
Potassium: 4 mmol/L (ref 3.5–5.1)
SODIUM: 133 mmol/L — AB (ref 135–145)

## 2018-07-11 MED ORDER — HYDROCODONE-ACETAMINOPHEN 5-325 MG PO TABS: 1.0000 | ORAL_TABLET | Freq: Four times a day (QID) | ORAL | 0 refills | Status: DC | PRN

## 2018-07-11 MED ORDER — PREDNISONE 20 MG PO TABS: 60.0000 mg | ORAL_TABLET | Freq: Every day | ORAL | Status: DC

## 2018-07-11 MED ORDER — AZITHROMYCIN 500 MG PO TABS: 500.0000 mg | ORAL_TABLET | Freq: Every day | ORAL | 0 refills | Status: DC

## 2018-07-11 MED ORDER — AMLODIPINE BESYLATE 2.5 MG PO TABS: 2.5000 mg | ORAL_TABLET | Freq: Every day | ORAL | 1 refills | Status: DC

## 2018-07-11 MED ORDER — PREDNISONE 10 MG PO TABS: 60.0000 mg | ORAL_TABLET | Freq: Every day | ORAL | 0 refills | Status: DC

## 2018-07-11 NOTE — Discharge Summary (Signed)
Physician Discharge Summary  Cristian Yang VQQ:[id number] DOB: 1935/03/23 DOA: 07/09/2018  PCP: Vonore Bing, DO  Admit date: 07/09/2018 Discharge date: 07/11/2018  Admitted From: Home Disposition:  Home   Recommendations for Outpatient Follow-up:  1. Follow up with PCP in 1-2 weeks 2. Please obtain BMP/CBC in one week   Home Health: YES Equipment/Devices: HHPT/OT  Discharge Condition: Stable CODE STATUS: FULL Diet recommendation: Heart Healthy   Brief/Interim Summary: 83 year old male with history of chronic back pain, COPD, hypertension, hyperlipidemia, hypothyroidism presenting after mechanical fall after tripping over a ladder falling onto his right wrist.  Apparently, when EMS was called the patient was noted to have a blood pressure of 64/44.  He stated that because of his back pain he had taken an extra hydrocodone earlier in the day which he had the insight to discern that this may have resulted in his falling.  The patient denied syncope but said that he had had some dizziness.  He denies hitting his head or any other injuries.  Further evaluation revealed that the patient has been short of breath for the past 3 days with increasing cough.  Here complaining of a "chest cold" for the past 3 to 4 days causing increasing cough with yellow sputum.  In the emergency department, the patient was noted to have oxygen saturation of 87% with ambulation and wheezing.  The patient received nebulizer treatment and steroids.  Because of COPD exacerbation, the patient was admitted for further evaluation and treatment  Discharge Diagnoses:  Acute respiratory failure with hypoxia -Initially on nasal cannula oxygen -Has been weaned off oxygen -Ambulatory pulse oximetry on room air did not show oxygen desaturation less than 88%  COPD exacerbation -Started Pulmicort -Continue duo nebs -Continue Solu-Medrol>> discharge home with prednisone taper  Orthostatic hypotension -Patient  was noted to have positive orthostatics -Continue IV fluids -Repeat orthostatics--negative  Hyponatremia -Likely due to volume depletion as well was HCTZ -Holding HCTZ--will not restart after discharge -Continue IV fluids -Presented with sodium 126 -Sodium 133 at the time of discharge  Closed right radial fracture -X-ray of the right wrist revealed a distal radius fracture that is nondisplaced -Splint was placed in the emergency department -Patient will follow-up with outpatient orthopedics -Judicious pain control  Essential hypertension -Holding HCTZ and monitor blood pressure -Will not restart HCTZ as discussed above -Discharge home with amlodipine  Hypothyroidism -Continue Synthroid  Depression -Continue Celexa     Discharge Instructions   Allergies as of 07/11/2018      Reactions   Ciprofloxacin Nausea And Vomiting   Codeine Nausea And Vomiting      Medication List    STOP taking these medications   hydrochlorothiazide 25 MG tablet Commonly known as:  HYDRODIURIL     TAKE these medications   albuterol (2.5 MG/3ML) 0.083% nebulizer solution Commonly known as:  PROVENTIL USE 1 VIAL IN NEBULIZER ONCE DAILY. What changed:    how much to take  how to take this  when to take this  additional instructions   amLODipine 2.5 MG tablet Commonly known as:  NORVASC Take 1 tablet (2.5 mg total) by mouth daily.   azithromycin 500 MG tablet Commonly known as:  ZITHROMAX Take 1 tablet (500 mg total) by mouth daily.   citalopram 20 MG tablet Commonly known as:  CELEXA TAKE 1 TABLET BY MOUTH ONCE DAILY. What changed:  when to take this   docusate sodium 100 MG capsule Commonly known as:  COLACE TAKE 1 CAPSULE BY MOUTH  IN THE MORNING What changed:  See the new instructions.   Fluticasone-Umeclidin-Vilant 100-62.5-25 MCG/INH Aepb Commonly known as:  TRELEGY ELLIPTA Inhale 1 puff into the lungs daily.   gabapentin 300 MG capsule Commonly known  as:  NEURONTIN TAKE 1 CAPSULE BY MOUTH IN THE MORNING TAKE 2 CAPSULE IN THE EVENING What changed:    how much to take  how to take this  when to take this  additional instructions   GNP ASPIRIN LOW DOSE 81 MG EC tablet Generic drug:  aspirin TAKE 1 TABLET BY MOUTH ONCE DAILY. What changed:    how much to take  when to take this   GNP MELATONIN 3 MG Tabs Generic drug:  Melatonin TAKE 1 TABLET BY MOUTH ONE HOUR BEFORE BED What changed:  See the new instructions.   HYDROcodone-acetaminophen 5-325 MG tablet Commonly known as:  NORCO/VICODIN Take 1 tablet by mouth every 6 (six) hours as needed for moderate pain. What changed:  when to take this   levothyroxine 75 MCG tablet Commonly known as:  SYNTHROID, LEVOTHROID TAKE 1 TABLET IN THE MORNING BEFORE BREAKFAST What changed:  See the new instructions.   lidocaine 5 % ointment Commonly known as:  XYLOCAINE Apply 1 application topically 3 (three) times daily as needed. Apply to affected area   losartan 100 MG tablet Commonly known as:  COZAAR TAKE 1 TABLET BY MOUTH ONCE DAILY. What changed:  when to take this   MILK OF MAGNESIA PO Take 30 mLs by mouth daily as needed (constipation).   mometasone 50 MCG/ACT nasal spray Commonly known as:  NASONEX SPRAY 2 SPRAYS INTO EACH NOSTRIL ONCE DAILY What changed:  See the new instructions.   montelukast 10 MG tablet Commonly known as:  SINGULAIR TAKE ONE TABLET BY MOUTH AT BEDTIME.   multivitamin Tabs tablet TAKE 1 TABLET BY MOUTH ONCE DAILY. What changed:  when to take this   omeprazole 20 MG capsule Commonly known as:  PRILOSEC TAKE (1) CAPSULE BY MOUTH ONCE DAILY. What changed:  See the new instructions.   predniSONE 10 MG tablet Commonly known as:  DELTASONE Take 6 tablets (60 mg total) by mouth daily with breakfast. And decrease by one tablet daily Start taking on:  July 12, 2018   PRESCRIPTION MEDICATION Patient uses CPAP at night   TUMS ULTRA 1000 400  MG chewable tablet Generic drug:  calcium elemental as carbonate Chew 1,000 mg by mouth 3 (three) times daily as needed for heartburn.      Follow-up Information    Carole Civil, MD Follow up in 1 week(s).   Specialties:  Orthopedic Surgery, Radiology Contact information: [address] [place] [address] [phone]          Allergies  Allergen Reactions  . Ciprofloxacin Nausea And Vomiting  . Codeine Nausea And Vomiting    Consultations:  none   Procedures/Studies: Dg Chest 2 View  Result Date: 07/09/2018 CLINICAL DATA:  Fall EXAM: CHEST - 2 VIEW COMPARISON:  04/10/2018 FINDINGS: Mild cardiomegaly. No pleural effusion. Aortic atherosclerosis. No pneumothorax. IMPRESSION: No active cardiopulmonary disease.  Mild cardiomegaly. Electronically Signed   By: Donavan Foil M.D.   On: 07/09/2018 20:22   Dg Wrist Complete Right  Result Date: 07/09/2018 CLINICAL DATA:  Fall with wrist pain EXAM: RIGHT WRIST - COMPLETE 3+ VIEW COMPARISON:  None. FINDINGS: Widened scapholunate interval up to 6 mm. Arthritis at the first [facility] joint. No dislocation. Lucency at the articular surface of the distal radius  on the scaphoid view. Soft tissue swelling at the wrist. IMPRESSION: 1. Findings suspicious for nondisplaced intra-articular distal radius fracture 2. Widened scapholunate interval, consistent with ligamentous injury. Electronically Signed   By: Donavan Foil M.D.   On: 07/09/2018 20:21   Ct Head Wo Contrast  Result Date: 07/09/2018 CLINICAL DATA:  Hand pain following a fall. EXAM: CT HEAD WITHOUT CONTRAST CT CERVICAL SPINE WITHOUT CONTRAST TECHNIQUE: Multidetector CT imaging of the head and cervical spine was performed following the standard protocol without intravenous contrast. Multiplanar CT image reconstructions of the cervical spine were also generated. COMPARISON:  Cervical spine radiographs dated 06/11/2018. Head CT dated 11/05/2017. FINDINGS: CT HEAD FINDINGS  Brain: Mild-to-moderate diffuse enlargement of the ventricles and subarachnoid spaces without significant change. Stable moderate patchy white matter low density in both cerebral hemispheres. No intracranial hemorrhage, mass lesion or CT evidence of acute infarction. Vascular: No hyperdense vessel or unexpected calcification. Skull: Normal. Negative for fracture or focal lesion. Sinuses/Orbits: Status post bilateral cataract extraction. Moderate bilateral ethmoid and sphenoid sinus mucosal thickening. Mild-to-moderate bilateral inferior frontal sinus mucosal thickening. Other: None. CT CERVICAL SPINE FINDINGS Alignment: Stable grade 1 anterolisthesis at the C3-4, C4-5 and C7-T1 levels. Skull base and vertebrae: No acute fracture. No primary bone lesion or focal pathologic process. Soft tissues and spinal canal: No prevertebral fluid or swelling. No visible canal hematoma. Disc levels: Stable multilevel degenerative changes, including facet degenerative changes throughout the cervical spine. Stable solid interbody bone and screw and plate fusion at the C5 through C7 levels. Upper chest: Clear lung apices. Other: Mild bilateral carotid artery calcifications. IMPRESSION: 1. No skull fracture or intracranial hemorrhage. 2. No cervical spine fracture or traumatic subluxation. 3. Stable mild to moderate diffuse cerebral and cerebellar atrophy. 4. Stable moderate chronic small vessel white matter ischemic changes in both cerebral hemispheres. 5. Stable multilevel cervical spine degenerative changes and C5 through C7 fusion. 6. Mild bilateral carotid artery atheromatous calcifications. Electronically Signed   By: Claudie Revering M.D.   On: 07/09/2018 21:47   Ct Cervical Spine Wo Contrast  Result Date: 07/09/2018 CLINICAL DATA:  Hand pain following a fall. EXAM: CT HEAD WITHOUT CONTRAST CT CERVICAL SPINE WITHOUT CONTRAST TECHNIQUE: Multidetector CT imaging of the head and cervical spine was performed following the standard  protocol without intravenous contrast. Multiplanar CT image reconstructions of the cervical spine were also generated. COMPARISON:  Cervical spine radiographs dated 06/11/2018. Head CT dated 11/05/2017. FINDINGS: CT HEAD FINDINGS Brain: Mild-to-moderate diffuse enlargement of the ventricles and subarachnoid spaces without significant change. Stable moderate patchy white matter low density in both cerebral hemispheres. No intracranial hemorrhage, mass lesion or CT evidence of acute infarction. Vascular: No hyperdense vessel or unexpected calcification. Skull: Normal. Negative for fracture or focal lesion. Sinuses/Orbits: Status post bilateral cataract extraction. Moderate bilateral ethmoid and sphenoid sinus mucosal thickening. Mild-to-moderate bilateral inferior frontal sinus mucosal thickening. Other: None. CT CERVICAL SPINE FINDINGS Alignment: Stable grade 1 anterolisthesis at the C3-4, C4-5 and C7-T1 levels. Skull base and vertebrae: No acute fracture. No primary bone lesion or focal pathologic process. Soft tissues and spinal canal: No prevertebral fluid or swelling. No visible canal hematoma. Disc levels: Stable multilevel degenerative changes, including facet degenerative changes throughout the cervical spine. Stable solid interbody bone and screw and plate fusion at the C5 through C7 levels. Upper chest: Clear lung apices. Other: Mild bilateral carotid artery calcifications. IMPRESSION: 1. No skull fracture or intracranial hemorrhage. 2. No cervical spine fracture or traumatic subluxation. 3. Stable mild to  moderate diffuse cerebral and cerebellar atrophy. 4. Stable moderate chronic small vessel white matter ischemic changes in both cerebral hemispheres. 5. Stable multilevel cervical spine degenerative changes and C5 through C7 fusion. 6. Mild bilateral carotid artery atheromatous calcifications. Electronically Signed   By: Claudie Revering M.D.   On: 07/09/2018 21:47   Dg Hand Complete Right  Result Date:  07/09/2018 CLINICAL DATA:  Fall EXAM: RIGHT HAND - COMPLETE 3+ VIEW COMPARISON:  None. FINDINGS: No acute fracture or malalignment within the bones of the right hand. Moderate arthritis at the first [facility] joint. DIP and PIP arthritis. Possible foreign body projecting over the soft tissues between the first and second digits. IMPRESSION: No acute osseous abnormality within the bones of the right hand. See separately dictated wrist report. Possible small foreign body within the soft tissues between the first and second digits. Electronically Signed   By: Donavan Foil M.D.   On: 07/09/2018 20:20         Discharge Exam: Vitals:   07/11/18 0719 07/11/18 0745  BP: (!) 164/70   Pulse: 70   Resp: 18   Temp: 97.7 F (36.5 C)   SpO2: 96% 95%   Vitals:   07/10/18 1927 07/10/18 2103 07/11/18 0719 07/11/18 0745  BP:  133/68 (!) 164/70   Pulse:  71 70   Resp:  18 18   Temp:  97.7 F (36.5 C) 97.7 F (36.5 C)   TempSrc:  Oral Oral   SpO2: 98% 95% 96% 95%  Weight:      Height:        General: Pt is alert, awake, not in acute distress Cardiovascular: RRR, S1/S2 +, no rubs, no gallops Respiratory: CTA bilaterally, no wheezing, no rhonchi Abdominal: Soft, NT, ND, bowel sounds + Extremities: no edema, no cyanosis   The results of significant diagnostics from this hospitalization (including imaging, microbiology, ancillary and laboratory) are listed below for reference.    Significant Diagnostic Studies: Dg Chest 2 View  Result Date: 07/09/2018 CLINICAL DATA:  Fall EXAM: CHEST - 2 VIEW COMPARISON:  04/10/2018 FINDINGS: Mild cardiomegaly. No pleural effusion. Aortic atherosclerosis. No pneumothorax. IMPRESSION: No active cardiopulmonary disease.  Mild cardiomegaly. Electronically Signed   By: Donavan Foil M.D.   On: 07/09/2018 20:22   Dg Wrist Complete Right  Result Date: 07/09/2018 CLINICAL DATA:  Fall with wrist pain EXAM: RIGHT WRIST - COMPLETE 3+ VIEW COMPARISON:  None. FINDINGS:  Widened scapholunate interval up to 6 mm. Arthritis at the first [facility] joint. No dislocation. Lucency at the articular surface of the distal radius on the scaphoid view. Soft tissue swelling at the wrist. IMPRESSION: 1. Findings suspicious for nondisplaced intra-articular distal radius fracture 2. Widened scapholunate interval, consistent with ligamentous injury. Electronically Signed   By: Donavan Foil M.D.   On: 07/09/2018 20:21   Ct Head Wo Contrast  Result Date: 07/09/2018 CLINICAL DATA:  Hand pain following a fall. EXAM: CT HEAD WITHOUT CONTRAST CT CERVICAL SPINE WITHOUT CONTRAST TECHNIQUE: Multidetector CT imaging of the head and cervical spine was performed following the standard protocol without intravenous contrast. Multiplanar CT image reconstructions of the cervical spine were also generated. COMPARISON:  Cervical spine radiographs dated 06/11/2018. Head CT dated 11/05/2017. FINDINGS: CT HEAD FINDINGS Brain: Mild-to-moderate diffuse enlargement of the ventricles and subarachnoid spaces without significant change. Stable moderate patchy white matter low density in both cerebral hemispheres. No intracranial hemorrhage, mass lesion or CT evidence of acute infarction. Vascular: No hyperdense vessel or unexpected calcification. Skull: Normal.  Negative for fracture or focal lesion. Sinuses/Orbits: Status post bilateral cataract extraction. Moderate bilateral ethmoid and sphenoid sinus mucosal thickening. Mild-to-moderate bilateral inferior frontal sinus mucosal thickening. Other: None. CT CERVICAL SPINE FINDINGS Alignment: Stable grade 1 anterolisthesis at the C3-4, C4-5 and C7-T1 levels. Skull base and vertebrae: No acute fracture. No primary bone lesion or focal pathologic process. Soft tissues and spinal canal: No prevertebral fluid or swelling. No visible canal hematoma. Disc levels: Stable multilevel degenerative changes, including facet degenerative changes throughout the cervical spine. Stable solid  interbody bone and screw and plate fusion at the C5 through C7 levels. Upper chest: Clear lung apices. Other: Mild bilateral carotid artery calcifications. IMPRESSION: 1. No skull fracture or intracranial hemorrhage. 2. No cervical spine fracture or traumatic subluxation. 3. Stable mild to moderate diffuse cerebral and cerebellar atrophy. 4. Stable moderate chronic small vessel white matter ischemic changes in both cerebral hemispheres. 5. Stable multilevel cervical spine degenerative changes and C5 through C7 fusion. 6. Mild bilateral carotid artery atheromatous calcifications. Electronically Signed   By: Claudie Revering M.D.   On: 07/09/2018 21:47   Ct Cervical Spine Wo Contrast  Result Date: 07/09/2018 CLINICAL DATA:  Hand pain following a fall. EXAM: CT HEAD WITHOUT CONTRAST CT CERVICAL SPINE WITHOUT CONTRAST TECHNIQUE: Multidetector CT imaging of the head and cervical spine was performed following the standard protocol without intravenous contrast. Multiplanar CT image reconstructions of the cervical spine were also generated. COMPARISON:  Cervical spine radiographs dated 06/11/2018. Head CT dated 11/05/2017. FINDINGS: CT HEAD FINDINGS Brain: Mild-to-moderate diffuse enlargement of the ventricles and subarachnoid spaces without significant change. Stable moderate patchy white matter low density in both cerebral hemispheres. No intracranial hemorrhage, mass lesion or CT evidence of acute infarction. Vascular: No hyperdense vessel or unexpected calcification. Skull: Normal. Negative for fracture or focal lesion. Sinuses/Orbits: Status post bilateral cataract extraction. Moderate bilateral ethmoid and sphenoid sinus mucosal thickening. Mild-to-moderate bilateral inferior frontal sinus mucosal thickening. Other: None. CT CERVICAL SPINE FINDINGS Alignment: Stable grade 1 anterolisthesis at the C3-4, C4-5 and C7-T1 levels. Skull base and vertebrae: No acute fracture. No primary bone lesion or focal pathologic  process. Soft tissues and spinal canal: No prevertebral fluid or swelling. No visible canal hematoma. Disc levels: Stable multilevel degenerative changes, including facet degenerative changes throughout the cervical spine. Stable solid interbody bone and screw and plate fusion at the C5 through C7 levels. Upper chest: Clear lung apices. Other: Mild bilateral carotid artery calcifications. IMPRESSION: 1. No skull fracture or intracranial hemorrhage. 2. No cervical spine fracture or traumatic subluxation. 3. Stable mild to moderate diffuse cerebral and cerebellar atrophy. 4. Stable moderate chronic small vessel white matter ischemic changes in both cerebral hemispheres. 5. Stable multilevel cervical spine degenerative changes and C5 through C7 fusion. 6. Mild bilateral carotid artery atheromatous calcifications. Electronically Signed   By: Claudie Revering M.D.   On: 07/09/2018 21:47   Dg Hand Complete Right  Result Date: 07/09/2018 CLINICAL DATA:  Fall EXAM: RIGHT HAND - COMPLETE 3+ VIEW COMPARISON:  None. FINDINGS: No acute fracture or malalignment within the bones of the right hand. Moderate arthritis at the first [facility] joint. DIP and PIP arthritis. Possible foreign body projecting over the soft tissues between the first and second digits. IMPRESSION: No acute osseous abnormality within the bones of the right hand. See separately dictated wrist report. Possible small foreign body within the soft tissues between the first and second digits. Electronically Signed   By: Donavan Foil M.D.   On: 07/09/2018  20:20     Microbiology: Recent Results (from the past 240 hour(s))  Urine culture     Status: None   Collection Time: 07/09/18  9:14 PM  Result Value Ref Range Status   Specimen Description   Final    URINE, CLEAN CATCH Performed at [facility], [address] [place], [place] [address]    Special Requests   Final    NONE Performed at [facility], [address], [place], [place] [address]    Culture    Final    NO GROWTH Performed at [facility], [place] [address], [place], [place] [address]    Report Status 07/11/2018 FINAL  Final     Labs: Basic Metabolic Panel: Recent Labs  Lab 07/09/18 2002 07/10/18 0442 07/11/18 0612  NA 126* 130* 133*  K 3.7 3.7 4.0  CL 91* 97* 101  CO2 27 25 24   GLUCOSE 134* 156* 136*  BUN 9 11 14   CREATININE 0.87 0.82 0.63  CALCIUM 8.7* 8.9 8.6*  MG  --   --  2.1   Liver Function Tests: Recent Labs  Lab 07/09/18 2002  AST 21  ALT 16  ALKPHOS 76  BILITOT 0.5  PROT 6.9  ALBUMIN 4.1   No results for input(s): LIPASE, AMYLASE in the last 168 hours. No results for input(s): AMMONIA in the last 168 hours. CBC: Recent Labs  Lab 07/09/18 2002 07/10/18 0442  WBC 16.2* 12.2*  NEUTROABS 12.9* 11.5*  HGB 12.4* 11.7*  HCT 37.4* 34.5*  MCV 94.0 92.7  PLT 314 299   Cardiac Enzymes: Recent Labs  Lab 07/09/18 2002  TROPONINI <0.03   BNP: Invalid input(s): POCBNP CBG: No results for input(s): GLUCAP in the last 168 hours.  Time coordinating discharge:  36 minutes  Signed:  Orson Eva, DO [facility] Pager: [phone] 07/11/2018, 5:55 PM

## 2018-07-11 NOTE — Progress Notes (Signed)
Pt's IV catheter removed and intact. Pt's IV site clean dry and intact. Discharge instructions including medications and follow up appointments were reviewed and discussed with patient. All questions were answered and no further questions at this time. Pt in stable condition and in no acute distress at time of discharge. Pt will be escorted by nurse tech.  

## 2018-07-11 NOTE — Care Management Note (Signed)
Case Management Note  Patient Details  Name: Cristian Yang MRN: [id number] Date of Birth: 22-Apr-1935  Subjective/Objective:  Patient to be discharged per MD order. Orders in place for home health services. PT rec outpatient therapy but per patient he is unable to do that. He prefers home health. Has used [facility] before. Referral placed with jermaine who agrees to accept. There are no DME needs.                    Action/Plan:   Expected Discharge Date:                  Expected Discharge Plan:  [place]  In-House Referral:     Discharge planning Services  CM Consult  Post Acute Care Choice:  Home Health Choice offered to:  Patient  DME Arranged:    DME Agency:     HH Arranged:  PT, OT HH Agency:  [place]  Status of Service:  Completed, signed off  If discussed at [place] of Stay Meetings, dates discussed:    Additional Comments:  Latanya Maudlin, RN 07/11/2018, 12:33 PM

## 2018-07-13 ENCOUNTER — Telehealth: Payer: Self-pay | Admitting: Orthopedic Surgery

## 2018-07-13 NOTE — Telephone Encounter (Signed)
He doesn't have a severe fracture  Jan 3 ok

## 2018-07-13 NOTE — Telephone Encounter (Signed)
Dr Aline Brochure would you review this patient's Physician Discharge Summary and imaging results.  He needs to schedule an appointment with you.  Would you review your schedule and advise as to where to schedule this patient.  Is it okay to wait until Friday 07/24/18 or Monday 07/27/18?  Please advise  Thanks

## 2018-07-13 NOTE — Telephone Encounter (Signed)
I called and spoke to Mr and Mrs. Dobkins.   Appointment was given for Friday, 07/24/18 at 10:00 per Dr, Ruthe Mannan instruction.  They understood date and time.  If they have any problem, they will let us know before that time.

## 2018-07-16 DIAGNOSIS — W010XXD Fall on same level from slipping, tripping and stumbling without subsequent striking against object, subsequent encounter: Secondary | ICD-10-CM | POA: Diagnosis not present

## 2018-07-16 DIAGNOSIS — I10 Essential (primary) hypertension: Secondary | ICD-10-CM | POA: Diagnosis not present

## 2018-07-16 DIAGNOSIS — Z85828 Personal history of other malignant neoplasm of skin: Secondary | ICD-10-CM | POA: Diagnosis not present

## 2018-07-16 DIAGNOSIS — Z87891 Personal history of nicotine dependence: Secondary | ICD-10-CM | POA: Diagnosis not present

## 2018-07-16 DIAGNOSIS — G8929 Other chronic pain: Secondary | ICD-10-CM | POA: Diagnosis not present

## 2018-07-16 DIAGNOSIS — G4733 Obstructive sleep apnea (adult) (pediatric): Secondary | ICD-10-CM | POA: Diagnosis not present

## 2018-07-16 DIAGNOSIS — J441 Chronic obstructive pulmonary disease with (acute) exacerbation: Secondary | ICD-10-CM | POA: Diagnosis not present

## 2018-07-16 DIAGNOSIS — S52571D Other intraarticular fracture of lower end of right radius, subsequent encounter for closed fracture with routine healing: Secondary | ICD-10-CM | POA: Diagnosis not present

## 2018-07-16 DIAGNOSIS — M549 Dorsalgia, unspecified: Secondary | ICD-10-CM | POA: Diagnosis not present

## 2018-07-16 DIAGNOSIS — Z79891 Long term (current) use of opiate analgesic: Secondary | ICD-10-CM | POA: Diagnosis not present

## 2018-07-21 DIAGNOSIS — J441 Chronic obstructive pulmonary disease with (acute) exacerbation: Secondary | ICD-10-CM | POA: Diagnosis not present

## 2018-07-21 DIAGNOSIS — M549 Dorsalgia, unspecified: Secondary | ICD-10-CM | POA: Diagnosis not present

## 2018-07-21 DIAGNOSIS — G8929 Other chronic pain: Secondary | ICD-10-CM | POA: Diagnosis not present

## 2018-07-21 DIAGNOSIS — I10 Essential (primary) hypertension: Secondary | ICD-10-CM | POA: Diagnosis not present

## 2018-07-21 DIAGNOSIS — S52571D Other intraarticular fracture of lower end of right radius, subsequent encounter for closed fracture with routine healing: Secondary | ICD-10-CM | POA: Diagnosis not present

## 2018-07-21 DIAGNOSIS — G4733 Obstructive sleep apnea (adult) (pediatric): Secondary | ICD-10-CM | POA: Diagnosis not present

## 2018-07-23 DIAGNOSIS — I10 Essential (primary) hypertension: Secondary | ICD-10-CM | POA: Diagnosis not present

## 2018-07-23 DIAGNOSIS — J441 Chronic obstructive pulmonary disease with (acute) exacerbation: Secondary | ICD-10-CM | POA: Diagnosis not present

## 2018-07-23 DIAGNOSIS — G8929 Other chronic pain: Secondary | ICD-10-CM | POA: Diagnosis not present

## 2018-07-23 DIAGNOSIS — S52571D Other intraarticular fracture of lower end of right radius, subsequent encounter for closed fracture with routine healing: Secondary | ICD-10-CM | POA: Diagnosis not present

## 2018-07-23 DIAGNOSIS — M549 Dorsalgia, unspecified: Secondary | ICD-10-CM | POA: Diagnosis not present

## 2018-07-23 DIAGNOSIS — G4733 Obstructive sleep apnea (adult) (pediatric): Secondary | ICD-10-CM | POA: Diagnosis not present

## 2018-07-24 ENCOUNTER — Ambulatory Visit (INDEPENDENT_AMBULATORY_CARE_PROVIDER_SITE_OTHER): Payer: Medicare Other | Admitting: Orthopedic Surgery

## 2018-07-24 VITALS — BP 144/80 | HR 64 | Ht 68.0 in | Wt 220.0 lb

## 2018-07-24 DIAGNOSIS — S52571A Other intraarticular fracture of lower end of right radius, initial encounter for closed fracture: Secondary | ICD-10-CM

## 2018-07-24 NOTE — Patient Instructions (Addendum)
Wear brace to bed remove for bathing otherwise wear the rest of the day  Wrist Fracture Treated With Immobilization  A wrist fracture is a break or crack in one of the bones of the wrist. The wrist is made up of eight small bones at the palm of the hand (carpal bones) and two long bones that make up the forearm (radius and ulna). If the joint is stable and the bones are still in their normal position (nondisplaced), the injury may be treated with immobilization. This involves the use of a cast, splint, or sling to hold the wrist in place. Immobilization ensures that the bones continue to stay in the correct position while the wrist is healing. What are the causes? This condition may be caused by:  A direct force to the wrist.  Falling on an outstretched hand.  Trauma, such as a car accident or a fall. What increases the risk? The following factors may make you more likely to develop this condition:  Doing contact sports or high-risk sports such as skiing, biking, and ice skating.  Taking steroid medicines.  Smoking.  Being male.  Being Caucasian.  Drinking more than three alcoholic beverages per day.  Having a condition that weakens the bones (osteoporosis).  Being older.  Having a history of previous fractures. What are the signs or symptoms? Symptoms of this condition include:  Pain.  Swelling.  Bruising.  Not being able to move the wrist normally. Additionally, the wrist may hang in an odd position or appear deformed. How is this diagnosed? This condition may be diagnosed based on a physical exam and X-rays. You may also have a CT scan or MRI. How is this treated? Treatment for this condition involves wearing a cast, splint, or sling until the injured area is stable enough for you to begin range-of-motion exercises. You may also be prescribed pain medicine. Follow these instructions at home: If you have a cast:  Do not stick anything inside the cast to scratch  your skin. Doing that increases your risk of infection.  Check the skin around the cast every day. Tell your health care provider about any concerns.  You may put lotion on dry skin around the edges of the cast. Do not put lotion on the skin underneath the cast.  Keep the cast clean and dry. If you have a splint or sling:  Wear the splint or sling as told by your health care provider. Remove it only as told by your health care provider.  Loosen the splint or sling if your fingers tingle, become numb, or turn cold and blue.  Keep the splint or sling clean and dry. Bathing  Do not take baths, swim, or use a hot tub until your health care provider approves. Ask your health care provider if you may take showers. You may only be allowed to take sponge baths.  If your cast, splint, or sling is not waterproof: ? Do not let it get wet. ? Cover it with a watertight covering when you take a bath or a shower.  If you have a sling, remove it for bathing only if your health care provider tells you it is safe to do that. Managing pain, stiffness, and swelling   If directed, put ice on the injured area. ? If you have a removable splint or sling, remove it as told by your health care provider. ? Put ice in a plastic bag. ? Place a towel between your skin and the bag or  between your cast and the bag. ? Leave the ice on for 20 minutes, 2-3 times a day.  Move your fingers often to avoid stiffness and to lessen swelling.  Raise (elevate) the injured area above the level of your heart while you are sitting or lying down. Driving  Do not drive or use heavy machinery while taking prescription pain medicine.  Ask your health care provider when it is safe to drive if you have a cast, splint, or sling on your wrist. Activity  Return to your normal activities as told by your health care provider. Ask your health care provider what activities are safe for you.  Do not lift with your injured wrist  until your health care provider approves.  Do range-of-motion exercises only as told by your health care provider or physical therapist. General instructions  Do not put pressure on any part of the cast or splint until it is fully hardened. This may take several hours.  Take over-the-counter and prescription medicines only as told by your health care provider.  Do not use any products that contain nicotine or tobacco, such as cigarettes, e-cigarettes, and chewing tobacco. These can delay bone healing. If you need help quitting, ask your health care provider.  If you were prescribed pain medicine, take steps to prevent or treat constipation. Your health care provider may recommend that you: ? Drink enough fluid to keep your urine pale yellow. ? Take over-the-counter or prescription medicines. ? Eat foods that are high in fiber, such as beans, whole grains, and fresh fruits and vegetables. ? Limit foods that are high in fat and processed sugars, such as fried or sweet foods.  Keep all follow-up visits as told by your health care provider. This is important. Contact a health care provider if:  Your cast, splint, or sling is damaged or loose.  You have any new pain, swelling, or bruising.  Your pain, swelling, and bruising do not improve.  You have a fever.  You have chills. Get help right away if:  Your skin or fingers on your injured arm turn blue or gray.  Your arm feels cold or numb.  You have severe pain in your injured wrist. Summary  A wrist fracture is a break or crack in one of the bones of the wrist.  If the joint is stable and the bones are still in their normal position, the injury may be treated by wearing a cast, splint, or sling.  If you have a cast, check the skin around the cast every day. Tell your health care provider about any concerns.  If you have a splint or sling, wear it as told by your health care provider. Remove it only as told by your health care  provider. This information is not intended to replace advice given to you by your health care provider. Make sure you discuss any questions you have with your health care provider. Document Released: 04/17/2005 Document Revised: 12/09/2017 Document Reviewed: 12/09/2017 Elsevier Interactive Patient Education  2019 [employer].

## 2018-07-24 NOTE — Progress Notes (Signed)
Patient ID: Cristian Yang, male   DOB: 1935/03/30, 84 y.o.   MRN: [id number]  Chief Complaint  Patient presents with  . Wrist Injury    ER follow up on right wrist fracture, DOI 07-09-18.    HPI Cristian Yang is a 84 y.o. male.   He presents for evaluation of a fall resulting in a right wrist fracture  Pain is located over the right wrist the quality is dull the severity is mild the duration is since December 19 the timing is constant the context is improving  Initial evaluation was done in the emergency room the patient was placed in appropriate splint   Review of Systems Review of Systems  Skin: Negative.   Neurological: Negative for weakness and numbness.     Past Medical History:  Diagnosis Date  . Arthritis   . Asthma   . Chronic back pain   . COPD (chronic obstructive pulmonary disease) ([place])   . Depression   . GERD (gastroesophageal reflux disease)   . Hyperlipidemia   . Hypertension   . Hypothyroidism   . OSA on CPAP   . Skin cancer   . Sleep apnea    uses CPAP nightly    Past Surgical History:  Procedure Laterality Date  . CATARACT EXTRACTION W/PHACO Left 02/13/2016   Procedure: CATARACT EXTRACTION PHACO AND INTRAOCULAR LENS PLACEMENT (IOC);  Surgeon: Rutherford Guys, MD;  Location: [facility];  Service: Ophthalmology;  Laterality: Left;  CDE: 9.23  . NECK SURGERY     cervical disc  . PROSTATE SURGERY     TURP, laser  . SKIN CANCER EXCISION  2013  . skin cancer removed     x3      Physical Exam 1 Blood pressure (!) 144/80, pulse 64, height 5\' 8"  (1.727 m), weight 220 lb (99.8 kg). Physical Exam 2 The patient is well developed well nourished and well groomed. 3 Orientation to person place and time is normal  4 Mood is pleasant.  5 Ambulatory status normal  6 Inspection of the right wrist reveals mild tenderness   of the wrist without swelling and no deformity 7 Range of motion assessment: The range of motion is diminished primarily secondary to  pain 8 Stability tests are deferred because of pain but the x-ray shows no subluxation of the joint 9 Strength assessment muscle tone is normal resistance testing is deferred because of pain and swelling  10 Nerve function normal 11 Vascular function excellent 12 Local lymphatic system negative  Opposite extremity left wrist hand there is no alignment abnormality, no contracture, no subluxation, no atrophy and neurovascular exam is intact   MEDICAL DECISION SECTION  xrays ordered?  No  My independent reading of xrays: X-rays from the hospital show a chronic scapholunate interval widening with small nondisplaced intra-articular fracture distal radius without malalignment   Encounter Diagnosis  Name Primary?  . Other closed intra-articular fracture of distal end of right radius, initial encounter Yes     PLAN: I recommend he be placed in a removable splint x-ray in 4 weeks  No orders of the defined types were placed in this encounter.  Injection?  No MRI/CT/?  No

## 2018-07-27 ENCOUNTER — Telehealth: Payer: Self-pay | Admitting: Orthopedic Surgery

## 2018-07-27 DIAGNOSIS — G8929 Other chronic pain: Secondary | ICD-10-CM | POA: Diagnosis not present

## 2018-07-27 DIAGNOSIS — G4733 Obstructive sleep apnea (adult) (pediatric): Secondary | ICD-10-CM | POA: Diagnosis not present

## 2018-07-27 DIAGNOSIS — S52571D Other intraarticular fracture of lower end of right radius, subsequent encounter for closed fracture with routine healing: Secondary | ICD-10-CM | POA: Diagnosis not present

## 2018-07-27 DIAGNOSIS — I10 Essential (primary) hypertension: Secondary | ICD-10-CM | POA: Diagnosis not present

## 2018-07-27 DIAGNOSIS — M549 Dorsalgia, unspecified: Secondary | ICD-10-CM | POA: Diagnosis not present

## 2018-07-27 DIAGNOSIS — J441 Chronic obstructive pulmonary disease with (acute) exacerbation: Secondary | ICD-10-CM | POA: Diagnosis not present

## 2018-07-27 NOTE — Telephone Encounter (Signed)
I called to advise OT not indicated for his wrist at this time

## 2018-07-27 NOTE — Telephone Encounter (Signed)
Not needed

## 2018-07-27 NOTE — Telephone Encounter (Signed)
Right wrist fracture 07/09/18  [facility] wanting OT orders/ restrictions  OK for OT? What restrictions?

## 2018-07-27 NOTE — Telephone Encounter (Signed)
Kat with [facility] called needing to get authorization for [facility] OT. Stated she needs to know of any restrictions for him on his R Wrist. She would like to see him 2 times a week for 3 weeks.  Please call and advise  [phone]

## 2018-07-29 DIAGNOSIS — S52571D Other intraarticular fracture of lower end of right radius, subsequent encounter for closed fracture with routine healing: Secondary | ICD-10-CM | POA: Diagnosis not present

## 2018-07-29 DIAGNOSIS — G8929 Other chronic pain: Secondary | ICD-10-CM | POA: Diagnosis not present

## 2018-07-29 DIAGNOSIS — J441 Chronic obstructive pulmonary disease with (acute) exacerbation: Secondary | ICD-10-CM | POA: Diagnosis not present

## 2018-07-29 DIAGNOSIS — I10 Essential (primary) hypertension: Secondary | ICD-10-CM | POA: Diagnosis not present

## 2018-07-29 DIAGNOSIS — G4733 Obstructive sleep apnea (adult) (pediatric): Secondary | ICD-10-CM | POA: Diagnosis not present

## 2018-07-29 DIAGNOSIS — M549 Dorsalgia, unspecified: Secondary | ICD-10-CM | POA: Diagnosis not present

## 2018-08-03 DIAGNOSIS — Z9181 History of falling: Secondary | ICD-10-CM | POA: Diagnosis not present

## 2018-08-03 DIAGNOSIS — Z6835 Body mass index (BMI) 35.0-35.9, adult: Secondary | ICD-10-CM | POA: Diagnosis not present

## 2018-08-03 DIAGNOSIS — G4733 Obstructive sleep apnea (adult) (pediatric): Secondary | ICD-10-CM | POA: Diagnosis not present

## 2018-08-03 DIAGNOSIS — J302 Other seasonal allergic rhinitis: Secondary | ICD-10-CM | POA: Diagnosis not present

## 2018-08-03 DIAGNOSIS — E039 Hypothyroidism, unspecified: Secondary | ICD-10-CM | POA: Diagnosis not present

## 2018-08-03 DIAGNOSIS — K5900 Constipation, unspecified: Secondary | ICD-10-CM | POA: Diagnosis not present

## 2018-08-03 DIAGNOSIS — I10 Essential (primary) hypertension: Secondary | ICD-10-CM | POA: Diagnosis not present

## 2018-08-03 DIAGNOSIS — E871 Hypo-osmolality and hyponatremia: Secondary | ICD-10-CM | POA: Diagnosis not present

## 2018-08-03 DIAGNOSIS — M545 Low back pain: Secondary | ICD-10-CM | POA: Diagnosis not present

## 2018-08-03 DIAGNOSIS — R55 Syncope and collapse: Secondary | ICD-10-CM | POA: Diagnosis not present

## 2018-08-03 DIAGNOSIS — K219 Gastro-esophageal reflux disease without esophagitis: Secondary | ICD-10-CM | POA: Diagnosis not present

## 2018-08-03 DIAGNOSIS — S52514A Nondisplaced fracture of right radial styloid process, initial encounter for closed fracture: Secondary | ICD-10-CM | POA: Diagnosis not present

## 2018-08-03 DIAGNOSIS — J441 Chronic obstructive pulmonary disease with (acute) exacerbation: Secondary | ICD-10-CM | POA: Diagnosis not present

## 2018-08-03 DIAGNOSIS — J449 Chronic obstructive pulmonary disease, unspecified: Secondary | ICD-10-CM | POA: Diagnosis not present

## 2018-08-14 ENCOUNTER — Ambulatory Visit: Payer: Medicare Other | Admitting: Pulmonary Disease

## 2018-08-21 ENCOUNTER — Ambulatory Visit (INDEPENDENT_AMBULATORY_CARE_PROVIDER_SITE_OTHER): Payer: Medicare Other

## 2018-08-21 ENCOUNTER — Ambulatory Visit (INDEPENDENT_AMBULATORY_CARE_PROVIDER_SITE_OTHER): Payer: Medicare Other | Admitting: Orthopedic Surgery

## 2018-08-21 VITALS — BP 135/76 | HR 65 | Ht 68.0 in | Wt 220.0 lb

## 2018-08-21 DIAGNOSIS — S52571D Other intraarticular fracture of lower end of right radius, subsequent encounter for closed fracture with routine healing: Secondary | ICD-10-CM

## 2018-08-21 NOTE — Progress Notes (Signed)
Chief Complaint  Patient presents with  . Follow-up    Recheck on right wrist fracture, DOI 07-09-18.    Fu fracture distal rad 43 days post injury  Treatment day global period jan 3 day 28   Wrist looks good feels good I told him he can take his brace off he can use it week or 10 days if needed but should be fine follow-up as needed  Encounter Diagnosis  Name Primary?  . Other closed intra-articular fracture of distal end of right radius with routine healing, subsequent encounter 07/09/18 Yes   [facility] report  Dictated by Dr. Aline Brochure   Chief complaint right wrist injury  History of chronic scapholunate separation new onset nondisplaced intra-articular distal radius fracture  This fracture is healed alignment is normal scapholunate interval widening persist  Impression healed fracture distal radius chronic scapholunate ligament separation

## 2018-08-25 ENCOUNTER — Encounter: Payer: Self-pay | Admitting: Pulmonary Disease

## 2018-08-25 ENCOUNTER — Ambulatory Visit (INDEPENDENT_AMBULATORY_CARE_PROVIDER_SITE_OTHER): Payer: Medicare Other | Admitting: Pulmonary Disease

## 2018-08-25 VITALS — BP 124/70 | HR 65 | Ht 68.0 in | Wt 228.6 lb

## 2018-08-25 DIAGNOSIS — Z9989 Dependence on other enabling machines and devices: Secondary | ICD-10-CM

## 2018-08-25 DIAGNOSIS — J449 Chronic obstructive pulmonary disease, unspecified: Secondary | ICD-10-CM | POA: Diagnosis not present

## 2018-08-25 DIAGNOSIS — G4733 Obstructive sleep apnea (adult) (pediatric): Secondary | ICD-10-CM | POA: Diagnosis not present

## 2018-08-25 NOTE — Progress Notes (Signed)
[facility]  Chief Complaint  Patient presents with  . Follow-up    Pt doing well overall with cpap machine.    Constitutional:  BP 124/70 (BP Location: Left Arm, Cuff Size: Normal)   Pulse 65   Ht 5\' 8"  (1.727 m)   Wt 228 lb 9.6 oz (103.7 kg)   SpO2 94%   BMI 34.76 kg/m   Past Medical History:  Chronic back pain, Depression, GERD, HLD, HTN, Hypothyroidism  Brief Summary:  Cristian Yang is a 84 y.o. male COPD with asthma and emphysema, OSA, and rhinitis.  He fell in December.  Had wrist fracture.  CXR from then was okay.  Not having cough, wheeze, sputum, chest pain.  Uses CPAP w/o difficulty.   Physical Exam:   Appearance - well kempt   ENMT - no sinus tenderness, no nasal discharge, no oral exudate  Neck - no masses, trachea midline, no thyromegaly, no elevation in JVP  Respiratory - normal appearance of chest wall, normal respiratory effort w/o accessory muscle use, no dullness on percussion, no wheezing or rales  CV - s1s2 regular rate and rhythm, no murmurs, no peripheral edema, radial pulses symmetric  GI - soft, non tender  Lymph - no adenopathy noted in neck and axillary areas  MSK - normal gait  Ext - no cyanosis, clubbing, or joint inflammation noted  Skin - no rashes, lesions, or ulcers  Neuro - normal strength, oriented x 3  Psych - normal mood and affect    Assessment/Plan:   COPD with emphysema and asthma. - continue trelegy, and singulair - prn albuterol - he would like to get a nebulizer; order placed  Obstructive sleep apnea. - he is compliant with CPAP and reports benefit - will try to get copy of his download from [facility]  Upper airway cough with post-nasal drip. - continue singulair, zyrtec - advised him that he could change to OTC nasacort or flonase in place of nasonex if one of these is less expensive   Patient Instructions  Will arrange for home nebulizer machine  Follow up in 6  months    Chesley Mires, MD [place] Pager: [phone] 08/25/2018, 11:45 AM  Flow Sheet     Pulmonary tests:  PFT 12/12/14 >> FEV1 1.61 (60%), FEV1% 66, TLC 5.48 (82%), DLCO 47%, no BD  Sleep tests:  PSG 09/29/02 >> AHI 12, SaO2 low 85%  Cardiac tests:  Echo 11/09/17 >> mild LVH, EF 55 to 60%, mild AS   Medications:   Allergies as of 08/25/2018      Reactions   Ciprofloxacin Nausea And Vomiting   Codeine Nausea And Vomiting      Medication List       Accurate as of August 25, 2018 11:45 AM. Always use your most recent med list.        albuterol (2.5 MG/3ML) 0.083% nebulizer solution Commonly known as:  PROVENTIL USE 1 VIAL IN NEBULIZER ONCE DAILY.   amLODipine 2.5 MG tablet Commonly known as:  NORVASC Take 1 tablet (2.5 mg total) by mouth daily.   azithromycin 500 MG tablet Commonly known as:  ZITHROMAX Take 1 tablet (500 mg total) by mouth daily.   citalopram 20 MG tablet Commonly known as:  CELEXA TAKE 1 TABLET BY MOUTH ONCE DAILY.   docusate sodium 100 MG capsule Commonly known as:  COLACE TAKE 1 CAPSULE BY MOUTH IN THE MORNING   Fluticasone-Umeclidin-Vilant 100-62.5-25 MCG/INH Aepb Commonly known as:  TRELEGY ELLIPTA Inhale 1 puff into the lungs daily.   gabapentin 300 MG capsule Commonly known as:  NEURONTIN TAKE 1 CAPSULE BY MOUTH IN THE MORNING TAKE 2 CAPSULE IN THE EVENING   GNP ASPIRIN LOW DOSE 81 MG EC tablet Generic drug:  aspirin TAKE 1 TABLET BY MOUTH ONCE DAILY.   GNP MELATONIN 3 MG Tabs Generic drug:  Melatonin TAKE 1 TABLET BY MOUTH ONE HOUR BEFORE BED   HYDROcodone-acetaminophen 5-325 MG tablet Commonly known as:  NORCO/VICODIN Take 1 tablet by mouth every 6 (six) hours as needed for moderate pain.   levothyroxine 75 MCG tablet Commonly known as:  SYNTHROID, LEVOTHROID TAKE 1 TABLET IN THE MORNING BEFORE BREAKFAST   lidocaine 5 % ointment Commonly known as:  XYLOCAINE Apply 1 application topically 3  (three) times daily as needed. Apply to affected area   losartan 100 MG tablet Commonly known as:  COZAAR TAKE 1 TABLET BY MOUTH ONCE DAILY.   MILK OF MAGNESIA PO Take 30 mLs by mouth daily as needed (constipation).   mometasone 50 MCG/ACT nasal spray Commonly known as:  NASONEX SPRAY 2 SPRAYS INTO EACH NOSTRIL ONCE DAILY   montelukast 10 MG tablet Commonly known as:  SINGULAIR TAKE ONE TABLET BY MOUTH AT BEDTIME.   multivitamin Tabs tablet TAKE 1 TABLET BY MOUTH ONCE DAILY.   omeprazole 20 MG capsule Commonly known as:  PRILOSEC TAKE (1) CAPSULE BY MOUTH ONCE DAILY.   predniSONE 10 MG tablet Commonly known as:  DELTASONE Take 6 tablets (60 mg total) by mouth daily with breakfast. And decrease by one tablet daily   PRESCRIPTION MEDICATION Patient uses CPAP at night   TUMS ULTRA 1000 400 MG chewable tablet Generic drug:  calcium elemental as carbonate Chew 1,000 mg by mouth 3 (three) times daily as needed for heartburn.       Past Surgical History:  He  has a past surgical history that includes Skin cancer excision (2013); Prostate surgery; Neck surgery; skin cancer removed; and Cataract extraction w/PHACO (Left, 02/13/2016).  Family History:  His family history includes Asthma in his father; Bone cancer in his sister; Diabetes in his mother; Heart disease in his mother; Liver cancer in his brother.  Social History:  He  reports that he quit smoking about 15 years ago. His smoking use included cigarettes. He has a 60.00 pack-year smoking history. He has never used smokeless tobacco. He reports that he does not drink alcohol or use drugs.

## 2018-08-25 NOTE — Patient Instructions (Signed)
Will arrange for home nebulizer machine ° °Follow up in 6 months °

## 2018-08-31 ENCOUNTER — Telehealth: Payer: Self-pay | Admitting: Orthopedic Surgery

## 2018-08-31 DIAGNOSIS — S52571D Other intraarticular fracture of lower end of right radius, subsequent encounter for closed fracture with routine healing: Secondary | ICD-10-CM

## 2018-08-31 NOTE — Telephone Encounter (Signed)
Put in order sent message to [facility]

## 2018-08-31 NOTE — Telephone Encounter (Signed)
Patient and wife called, inquiring about orders for [facility] for [facility]. States had not heard anything, and had been here on 08/21/2018.  Phone [phone].

## 2018-08-31 NOTE — Telephone Encounter (Signed)
OT 2 x a week for 4 weeks

## 2018-09-01 NOTE — Telephone Encounter (Signed)
I have been advised he can not have OT only, this order must go with another order I have called to see if he thinks he can have outpatient therapy now.

## 2018-09-01 NOTE — Telephone Encounter (Signed)
They have declined the outpatient therapy, I explained can not do order for just OT / and there are no other needs at this time we can order.   WIfe states she will have him work on ROM of fingers and have him squeeze a ball or sponge to strengthen.

## 2018-09-07 DIAGNOSIS — I10 Essential (primary) hypertension: Secondary | ICD-10-CM | POA: Diagnosis not present

## 2018-09-07 DIAGNOSIS — J060 Acute laryngopharyngitis: Secondary | ICD-10-CM | POA: Diagnosis not present

## 2018-09-07 DIAGNOSIS — G4733 Obstructive sleep apnea (adult) (pediatric): Secondary | ICD-10-CM | POA: Diagnosis not present

## 2018-09-14 DIAGNOSIS — Z23 Encounter for immunization: Secondary | ICD-10-CM | POA: Diagnosis not present

## 2018-09-14 DIAGNOSIS — D485 Neoplasm of uncertain behavior of skin: Secondary | ICD-10-CM | POA: Diagnosis not present

## 2018-09-14 DIAGNOSIS — L309 Dermatitis, unspecified: Secondary | ICD-10-CM | POA: Diagnosis not present

## 2018-09-14 DIAGNOSIS — L720 Epidermal cyst: Secondary | ICD-10-CM | POA: Diagnosis not present

## 2018-09-14 DIAGNOSIS — C44612 Basal cell carcinoma of skin of right upper limb, including shoulder: Secondary | ICD-10-CM | POA: Diagnosis not present

## 2018-09-14 DIAGNOSIS — L821 Other seborrheic keratosis: Secondary | ICD-10-CM | POA: Diagnosis not present

## 2018-10-15 ENCOUNTER — Other Ambulatory Visit: Payer: Self-pay | Admitting: Family Medicine

## 2018-10-15 DIAGNOSIS — E039 Hypothyroidism, unspecified: Secondary | ICD-10-CM

## 2018-10-15 DIAGNOSIS — F329 Major depressive disorder, single episode, unspecified: Secondary | ICD-10-CM

## 2018-10-15 DIAGNOSIS — F32A Depression, unspecified: Secondary | ICD-10-CM

## 2018-10-15 NOTE — Telephone Encounter (Signed)
Overdue for TSH recheck. Will defer to PCP

## 2018-10-21 ENCOUNTER — Other Ambulatory Visit: Payer: Self-pay | Admitting: Family Medicine

## 2018-10-21 DIAGNOSIS — E039 Hypothyroidism, unspecified: Secondary | ICD-10-CM

## 2018-10-21 NOTE — Telephone Encounter (Signed)
Pt needs TSH rechecked. Okay with waiting until June. Will continue levothyroxine as prescribed. Future order placed for TSH on June 1st. Please advise.   Harriet Butte, [place], PGY-3

## 2018-10-21 NOTE — Telephone Encounter (Signed)
Attempted to call pt. LVM stating that Rx were sent to his pharmacy and ready for pick up. Also advised that Dr. Yisroel Ramming wanted him to set up an appt around June 1st to have lab work done. Will try again later. Salvatore Marvel, CMA

## 2018-11-12 ENCOUNTER — Telehealth: Payer: Self-pay

## 2018-11-12 NOTE — Telephone Encounter (Signed)
Patient scheduled for 5/20 at 940am with Dr. Debara Pickett

## 2018-11-12 NOTE — Telephone Encounter (Signed)
Called pt to change appointment to virtual visit and rescheduled to May 20th. Left message to call back.

## 2018-11-17 ENCOUNTER — Ambulatory Visit: Payer: Medicare Other | Admitting: Internal Medicine

## 2018-11-28 ENCOUNTER — Other Ambulatory Visit: Payer: Self-pay | Admitting: Pulmonary Disease

## 2018-11-28 DIAGNOSIS — J449 Chronic obstructive pulmonary disease, unspecified: Secondary | ICD-10-CM

## 2018-11-30 DIAGNOSIS — E039 Hypothyroidism, unspecified: Secondary | ICD-10-CM | POA: Diagnosis not present

## 2018-11-30 DIAGNOSIS — I10 Essential (primary) hypertension: Secondary | ICD-10-CM | POA: Diagnosis not present

## 2018-12-04 DIAGNOSIS — J060 Acute laryngopharyngitis: Secondary | ICD-10-CM | POA: Diagnosis not present

## 2018-12-04 DIAGNOSIS — E039 Hypothyroidism, unspecified: Secondary | ICD-10-CM | POA: Diagnosis not present

## 2018-12-04 DIAGNOSIS — G894 Chronic pain syndrome: Secondary | ICD-10-CM | POA: Diagnosis not present

## 2018-12-04 DIAGNOSIS — G4733 Obstructive sleep apnea (adult) (pediatric): Secondary | ICD-10-CM | POA: Diagnosis not present

## 2018-12-04 DIAGNOSIS — I10 Essential (primary) hypertension: Secondary | ICD-10-CM | POA: Diagnosis not present

## 2018-12-04 DIAGNOSIS — J302 Other seasonal allergic rhinitis: Secondary | ICD-10-CM | POA: Diagnosis not present

## 2018-12-04 DIAGNOSIS — F331 Major depressive disorder, recurrent, moderate: Secondary | ICD-10-CM | POA: Diagnosis not present

## 2018-12-04 DIAGNOSIS — J449 Chronic obstructive pulmonary disease, unspecified: Secondary | ICD-10-CM | POA: Diagnosis not present

## 2018-12-04 DIAGNOSIS — K219 Gastro-esophageal reflux disease without esophagitis: Secondary | ICD-10-CM | POA: Diagnosis not present

## 2018-12-04 DIAGNOSIS — M545 Low back pain: Secondary | ICD-10-CM | POA: Diagnosis not present

## 2018-12-08 ENCOUNTER — Telehealth: Payer: Self-pay | Admitting: Internal Medicine

## 2018-12-08 NOTE — Telephone Encounter (Signed)
Home phone/ consent/ declined my chart/ pre reg completed

## 2018-12-09 ENCOUNTER — Encounter: Payer: Self-pay | Admitting: Internal Medicine

## 2018-12-09 ENCOUNTER — Telehealth (INDEPENDENT_AMBULATORY_CARE_PROVIDER_SITE_OTHER): Payer: Medicare Other | Admitting: Internal Medicine

## 2018-12-09 VITALS — BP 140/75 | HR 98 | Ht 68.0 in | Wt 223.0 lb

## 2018-12-09 DIAGNOSIS — Z7189 Other specified counseling: Secondary | ICD-10-CM | POA: Diagnosis not present

## 2018-12-09 DIAGNOSIS — I350 Nonrheumatic aortic (valve) stenosis: Secondary | ICD-10-CM | POA: Diagnosis not present

## 2018-12-09 DIAGNOSIS — E782 Mixed hyperlipidemia: Secondary | ICD-10-CM

## 2018-12-09 DIAGNOSIS — I10 Essential (primary) hypertension: Secondary | ICD-10-CM

## 2018-12-09 NOTE — Patient Instructions (Addendum)
Medication Instructions:  Dr Debara Pickett recommends that you continue on your current medications as directed. Please refer to the Current Medication list given to you today.  If you need a refill on your cardiac medications before your next appointment, please call your pharmacy.   Testing/Procedures: Your physician has requested that you have an echocardiogram in 1 year. Echocardiography is a painless test that uses sound waves to create images of your heart. It provides your doctor with information about the size and shape of your heart and how well your heart's chambers and valves are working. This procedure takes approximately one hour. There are no restrictions for this procedure.  >> This will be performed at [place]: At [facility], you and your health needs are our priority.  As part of our continuing mission to provide you with exceptional heart care, we have created designated Provider Care Teams.  These Care Teams include your primary Cardiologist (physician) and Advanced Practice Providers (APPs -  Physician Assistants and Nurse Practitioners) who all work together to provide you with the care you need, when you need it. You will need a follow up appointment in 12 months.  Please call our office 2 months in advance to schedule this appointment.  You may see Pixie Casino, MD or one of the following Advanced Practice Providers on your designated Care Team: [place], [place] . Fabian Sharp, PA-C . You will receive a reminder letter in the mail two months in advance. If you don't receive a letter, please call our office to schedule the follow-up appointment.

## 2018-12-09 NOTE — Addendum Note (Signed)
Addended by: Diana Eves on: 12/09/2018 10:29 AM   Modules accepted: Orders

## 2018-12-09 NOTE — Progress Notes (Signed)
Virtual Visit via Telephone Note   This visit type was conducted due to national recommendations for restrictions regarding the COVID-19 Pandemic (e.g. social distancing) in an effort to limit this patient's exposure and mitigate transmission in our community.  Due to his co-morbid illnesses, this patient is at least at moderate risk for complications without adequate follow up.  This format is felt to be most appropriate for this patient at this time.  The patient did not have access to video technology/had technical difficulties with video requiring transitioning to audio format only (telephone).  All issues noted in this document were discussed and addressed.  No physical exam could be performed with this format.  Please refer to the patient's chart for his  consent to telehealth for [facility].   Evaluation Performed:  Telephone follow-up  Date:  12/09/2018   ID:  Cristian Yang, DOB Jul 22, 1935, MRN [id number]  Patient Location:  [place] [address]  Provider location:   [address], [place], [place] [address]  PCP:  [place] Bing, DO  Cardiologist:  No primary care provider on file. Electrophysiologist:  None   Chief Complaint:  No complaints  History of Present Illness:    Cristian Yang is a 84 y.o. male who presents via audio/video conferencing for a telehealth visit today.  Mr. Fricker is a pleasant 84 yo male with a history of mild non-obstructive CAD by cath in [place], [place] in 2014, mild aortic stenosis (EF 55-60% - 10/2017), HTN, HLD, OSA on CPAP, COPD and arthritis. Over the past year he was quite ill and in the ICU with septic shock in 10/2017. He did recover. He was also admitted for a fall and had a fracture in 06/2018. Currently he reports stable shortness of breath, no chest pain or change in exercise tolerance, palpitations, presyncope or syncopal symptoms.  The patient does not have symptoms concerning for COVID-19 infection (fever,  chills, cough, or new SHORTNESS OF BREATH).    Prior CV studies:   The following studies were reviewed today:  Chart review  PMHx:  Past Medical History:  Diagnosis Date   Arthritis    Asthma    Chronic back pain    COPD (chronic obstructive pulmonary disease) (HCC)    Depression    GERD (gastroesophageal reflux disease)    Hyperlipidemia    Hypertension    Hypothyroidism    OSA on CPAP    Skin cancer    Sleep apnea    uses CPAP nightly    Past Surgical History:  Procedure Laterality Date   CATARACT EXTRACTION W/PHACO Left 02/13/2016   Procedure: CATARACT EXTRACTION PHACO AND INTRAOCULAR LENS PLACEMENT ([place]);  Surgeon: Rutherford Guys, MD;  Location: [facility];  Service: Ophthalmology;  Laterality: Left;  CDE: 9.23   NECK SURGERY     cervical disc   PROSTATE SURGERY     TURP, laser   SKIN CANCER EXCISION  2013   skin cancer removed     x3    FAMHx:  Family History  Problem Relation Age of Onset   Diabetes Mother    Heart disease Mother    Liver cancer Brother    Bone cancer Sister    Asthma Father     SOCHx:   reports that he quit smoking about 15 years ago. His smoking use included cigarettes. He has a 60.00 pack-year smoking history. He has never used smokeless tobacco. He reports that he does not drink alcohol or  use drugs.  ALLERGIES:  Allergies  Allergen Reactions   Ciprofloxacin Nausea And Vomiting   Codeine Nausea And Vomiting    MEDS:  Current Meds  Medication Sig   albuterol (PROVENTIL) (2.5 MG/3ML) 0.083% nebulizer solution USE 1 VIAL IN NEBULIZER ONCE DAILY AS DIRECTED.   calcium elemental as carbonate (TUMS ULTRA 1000) 400 MG chewable tablet Chew 1,000 mg by mouth 3 (three) times daily as needed for heartburn.   citalopram (CELEXA) 20 MG tablet Take 1 tablet (20 mg total) by mouth at bedtime.   docusate sodium (COLACE) 100 MG capsule Take 1 capsule (100 mg total) by mouth every morning.   Fluticasone-Umeclidin-Vilant  (TRELEGY ELLIPTA) 100-62.5-25 MCG/INH AEPB Inhale 1 puff into the lungs daily.   gabapentin (NEURONTIN) 300 MG capsule TAKE 1 CAPSULE BY MOUTH IN THE MORNING TAKE 2 CAPSULE IN THE EVENING   GNP ASPIRIN LOW DOSE 81 MG EC tablet TAKE 1 TABLET BY MOUTH ONCE DAILY.   hydrochlorothiazide (HYDRODIURIL) 25 MG tablet Take 1 tablet by mouth daily.   HYDROcodone-acetaminophen (NORCO/VICODIN) 5-325 MG tablet Take 1 tablet by mouth every 6 (six) hours as needed for moderate pain.   levothyroxine (SYNTHROID, LEVOTHROID) 75 MCG tablet Take 1 tablet (75 mcg total) by mouth daily before breakfast.   lidocaine (XYLOCAINE) 5 % ointment Apply 1 application topically 3 (three) times daily as needed. Apply to affected area   losartan (COZAAR) 100 MG tablet Take 1 tablet (100 mg total) by mouth at bedtime.   mometasone (NASONEX) 50 MCG/ACT nasal spray SPRAY 2 SPRAYS INTO EACH NOSTRIL ONCE DAILY   montelukast (SINGULAIR) 10 MG tablet Take 1 tablet (10 mg total) by mouth at bedtime.   multivitamin (ONE-A-DAY MEN'S) TABS tablet Take 1 tablet by mouth every morning.   omeprazole (PRILOSEC) 20 MG capsule Take 1 capsule (20 mg total) by mouth every morning.   PRESCRIPTION MEDICATION Patient uses CPAP at night     ROS: Pertinent items noted in HPI and remainder of comprehensive ROS otherwise negative.  Labs/Other Tests and Data Reviewed:    Recent Labs: 07/09/2018: ALT 16 07/10/2018: Hemoglobin 11.7; Platelets 299 07/11/2018: BUN 14; Creatinine, Ser 0.63; Magnesium 2.1; Potassium 4.0; Sodium 133   Recent Lipid Panel Lab Results  Component Value Date/Time   CHOL 174 10/21/2017 02:48 PM   TRIG 172 (H) 10/21/2017 02:48 PM   HDL 80 10/21/2017 02:48 PM   CHOLHDL 2.2 10/21/2017 02:48 PM   LDLCALC 60 10/21/2017 02:48 PM    Wt Readings from Last 3 Encounters:  12/09/18 223 lb (101.2 kg)  08/25/18 228 lb 9.6 oz (103.7 kg)  08/21/18 220 lb (99.8 kg)     Exam:    Vital Signs:  BP 140/75    Pulse 98     Ht 5\' 8"  (1.727 m)    Wt 223 lb (101.2 kg)    BMI 33.91 kg/m    Exam not performed due to telephone visit  ASSESSMENT & PLAN:    1. Mild aortic stenosis (10/2017, LVEF 55-60%) 2. COPD 3. Hypertension-controlled 4. Dyslipidemia on pravastatin 5. Recent heart catheterization which did not demonstrate obstructive coronary disease  Mr. Mcglothlin is doing better after admission for septic shock about 1 year ago. He was found to have mild aortic stenosis which will require a repeat echo in 1 year. BP is generally well-controlled. Recent labs, including LDL are at goal <70. No changes in his medications today.  COVID-19 Education: The signs and symptoms of COVID-19 were discussed with the patient  and how to seek care for testing (follow up with PCP or arrange E-visit).  The importance of social distancing was discussed today.  Patient Risk:   After full review of this patients clinical status, I feel that they are at least moderate risk at this time.  Time:   Today, I have spent 25 minutes with the patient with telehealth technology discussing COPD, HTN, HLD, aortic stenosis.     Medication Adjustments/Labs and Tests Ordered: Current medicines are reviewed at length with the patient today.  Concerns regarding medicines are outlined above.   Tests Ordered: No orders of the defined types were placed in this encounter.   Medication Changes: No orders of the defined types were placed in this encounter.   Disposition:  in 1 year(s)  Pixie Casino, MD, [facility], [place] Director of the Advanced Lipid Disorders &  Cardiovascular Risk Reduction Clinic Diplomate of the American Board of Clinical Lipidology Attending Cardiologist  Direct Dial: [phone]   Fax: [phone]  Website:  [email or web address]  Pixie Casino, MD  12/09/2018 10:11 AM

## 2018-12-10 ENCOUNTER — Other Ambulatory Visit: Payer: Self-pay | Admitting: Family Medicine

## 2019-01-07 ENCOUNTER — Other Ambulatory Visit: Payer: Self-pay | Admitting: Family Medicine

## 2019-01-08 ENCOUNTER — Other Ambulatory Visit: Payer: Self-pay | Admitting: Pulmonary Disease

## 2019-01-14 DIAGNOSIS — D485 Neoplasm of uncertain behavior of skin: Secondary | ICD-10-CM | POA: Diagnosis not present

## 2019-01-14 DIAGNOSIS — C44622 Squamous cell carcinoma of skin of right upper limb, including shoulder: Secondary | ICD-10-CM | POA: Diagnosis not present

## 2019-01-14 DIAGNOSIS — C44612 Basal cell carcinoma of skin of right upper limb, including shoulder: Secondary | ICD-10-CM | POA: Diagnosis not present

## 2019-01-27 DIAGNOSIS — G894 Chronic pain syndrome: Secondary | ICD-10-CM | POA: Diagnosis not present

## 2019-01-27 DIAGNOSIS — M25511 Pain in right shoulder: Secondary | ICD-10-CM | POA: Diagnosis not present

## 2019-01-27 DIAGNOSIS — M545 Low back pain: Secondary | ICD-10-CM | POA: Diagnosis not present

## 2019-01-30 IMAGING — DX DG CHEST 2V
2 series · 2 of 2 positions shown · non-contrast
Comparison: December 10, 2015

CLINICAL DATA: Dyspnea starting today

EXAM:
CHEST - 2 VIEW

[chest pa]
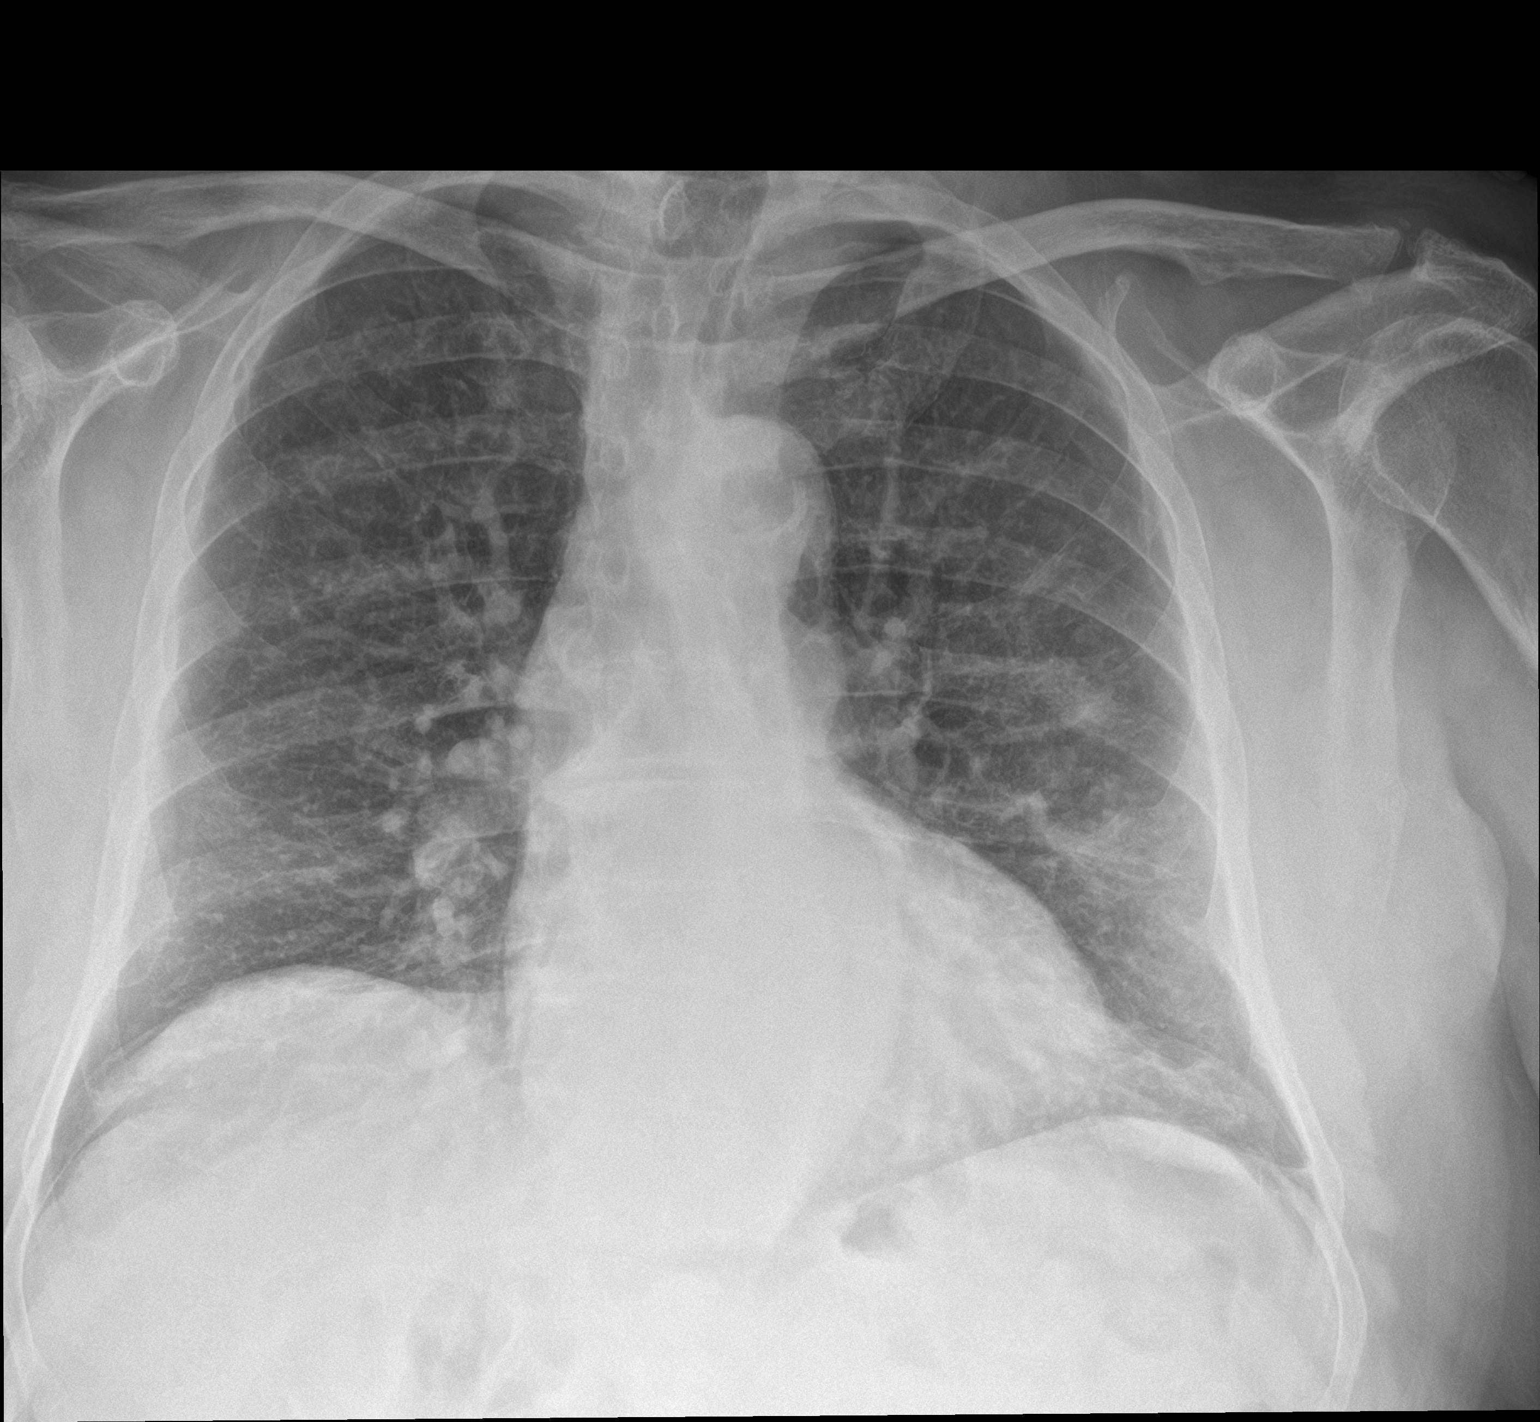

[chest lat]
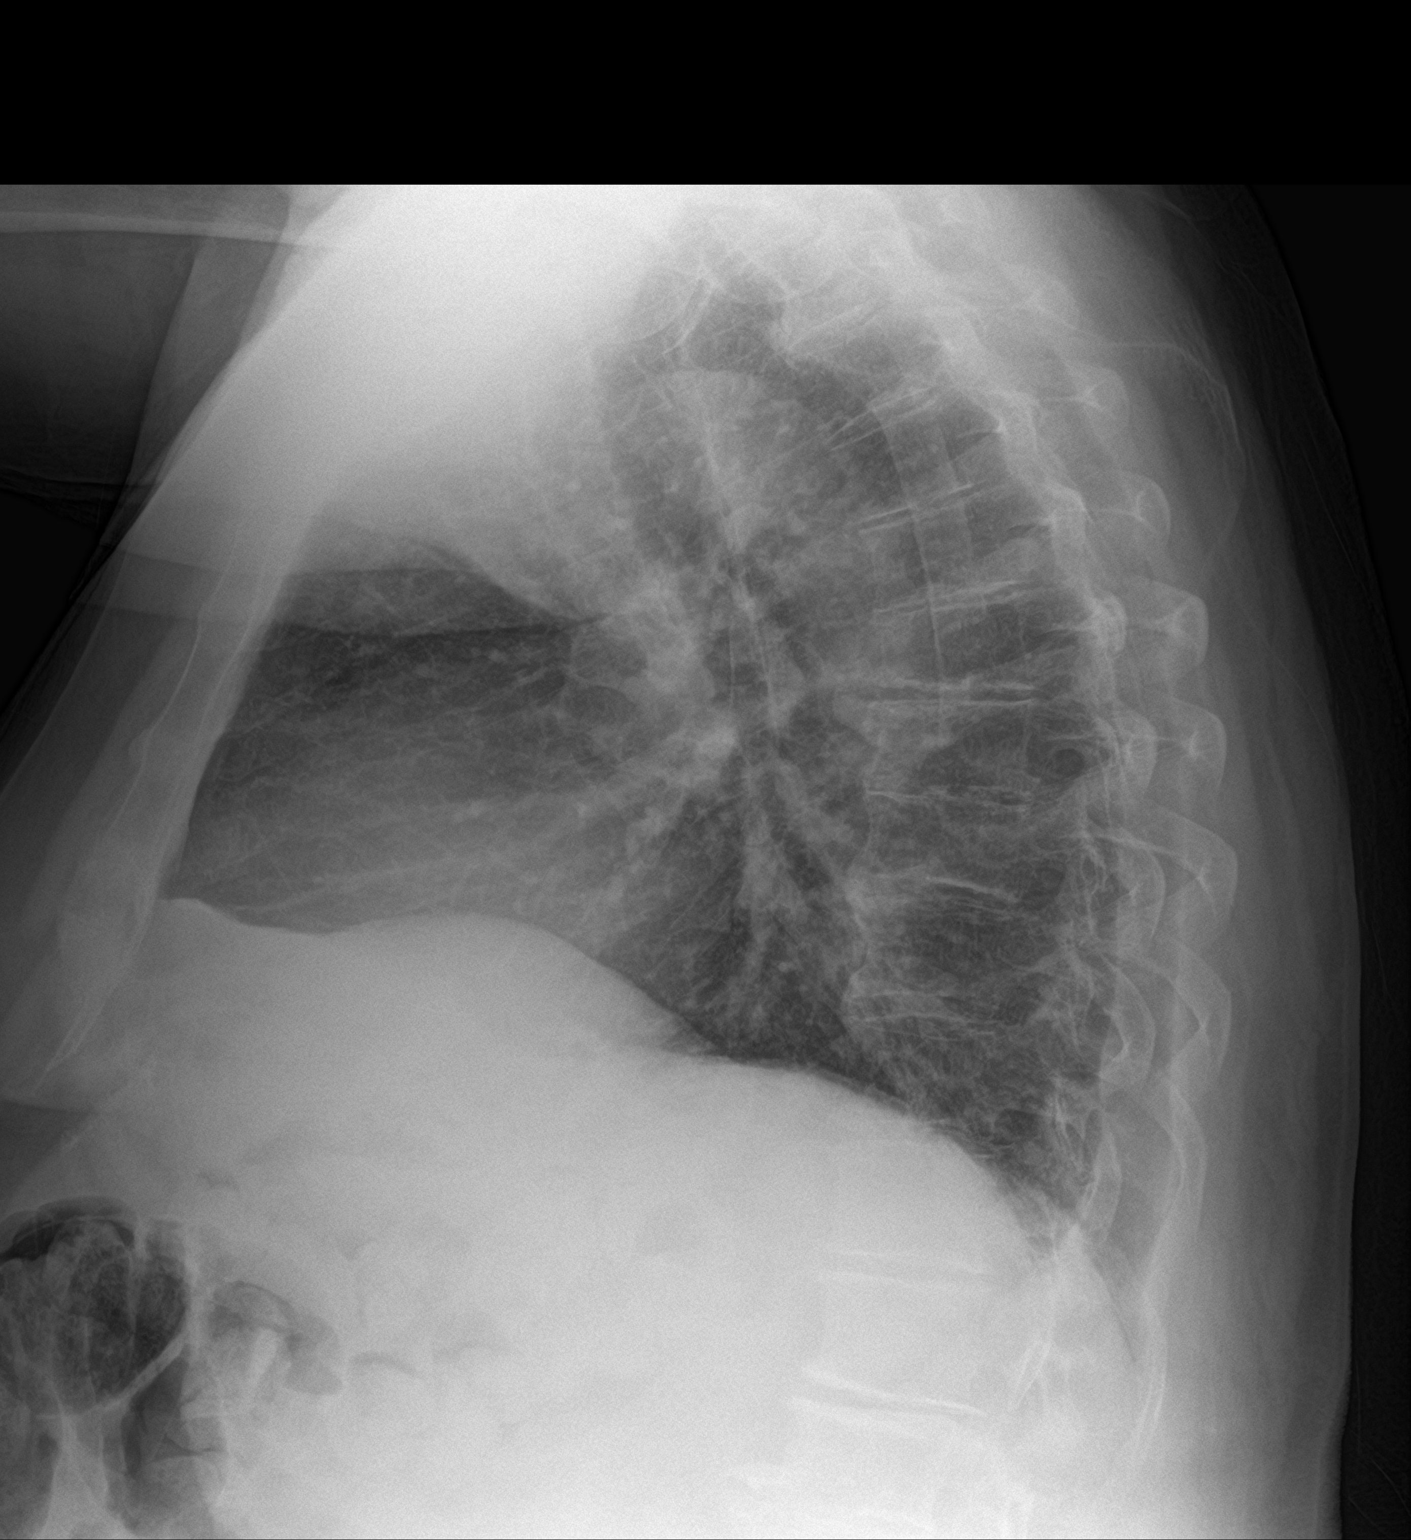

[2 of 2 positions shown; findings below may reference images not displayed]

FINDINGS: The heart size and mediastinal contours are stable. The aorta is
tortuous. There is chronic central pulmonary vascular congestion
unchanged compared to prior x-ray. There is chronic scar of the left
mid lung unchanged compared prior exam. There is no focal pneumonia
frank pulmonary edema or pleural effusion.. The visualized skeletal
structures are stable.
IMPRESSION: No active cardiopulmonary disease. Chronic central pulmonary
vascular congestion unchanged compared to prior chest x-ray [DATE],

## 2019-02-04 DIAGNOSIS — C44612 Basal cell carcinoma of skin of right upper limb, including shoulder: Secondary | ICD-10-CM | POA: Diagnosis not present

## 2019-02-04 DIAGNOSIS — C44622 Squamous cell carcinoma of skin of right upper limb, including shoulder: Secondary | ICD-10-CM | POA: Diagnosis not present

## 2019-02-04 DIAGNOSIS — L905 Scar conditions and fibrosis of skin: Secondary | ICD-10-CM | POA: Diagnosis not present

## 2019-02-16 IMAGING — DX DG CHEST 1V PORT
1 series · 1 of 1 positions shown · non-contrast
Comparison: Radiograph November 09, 2017.

CLINICAL DATA: Shortness of breath.

EXAM:
PORTABLE CHEST 1 VIEW

[chest ap]
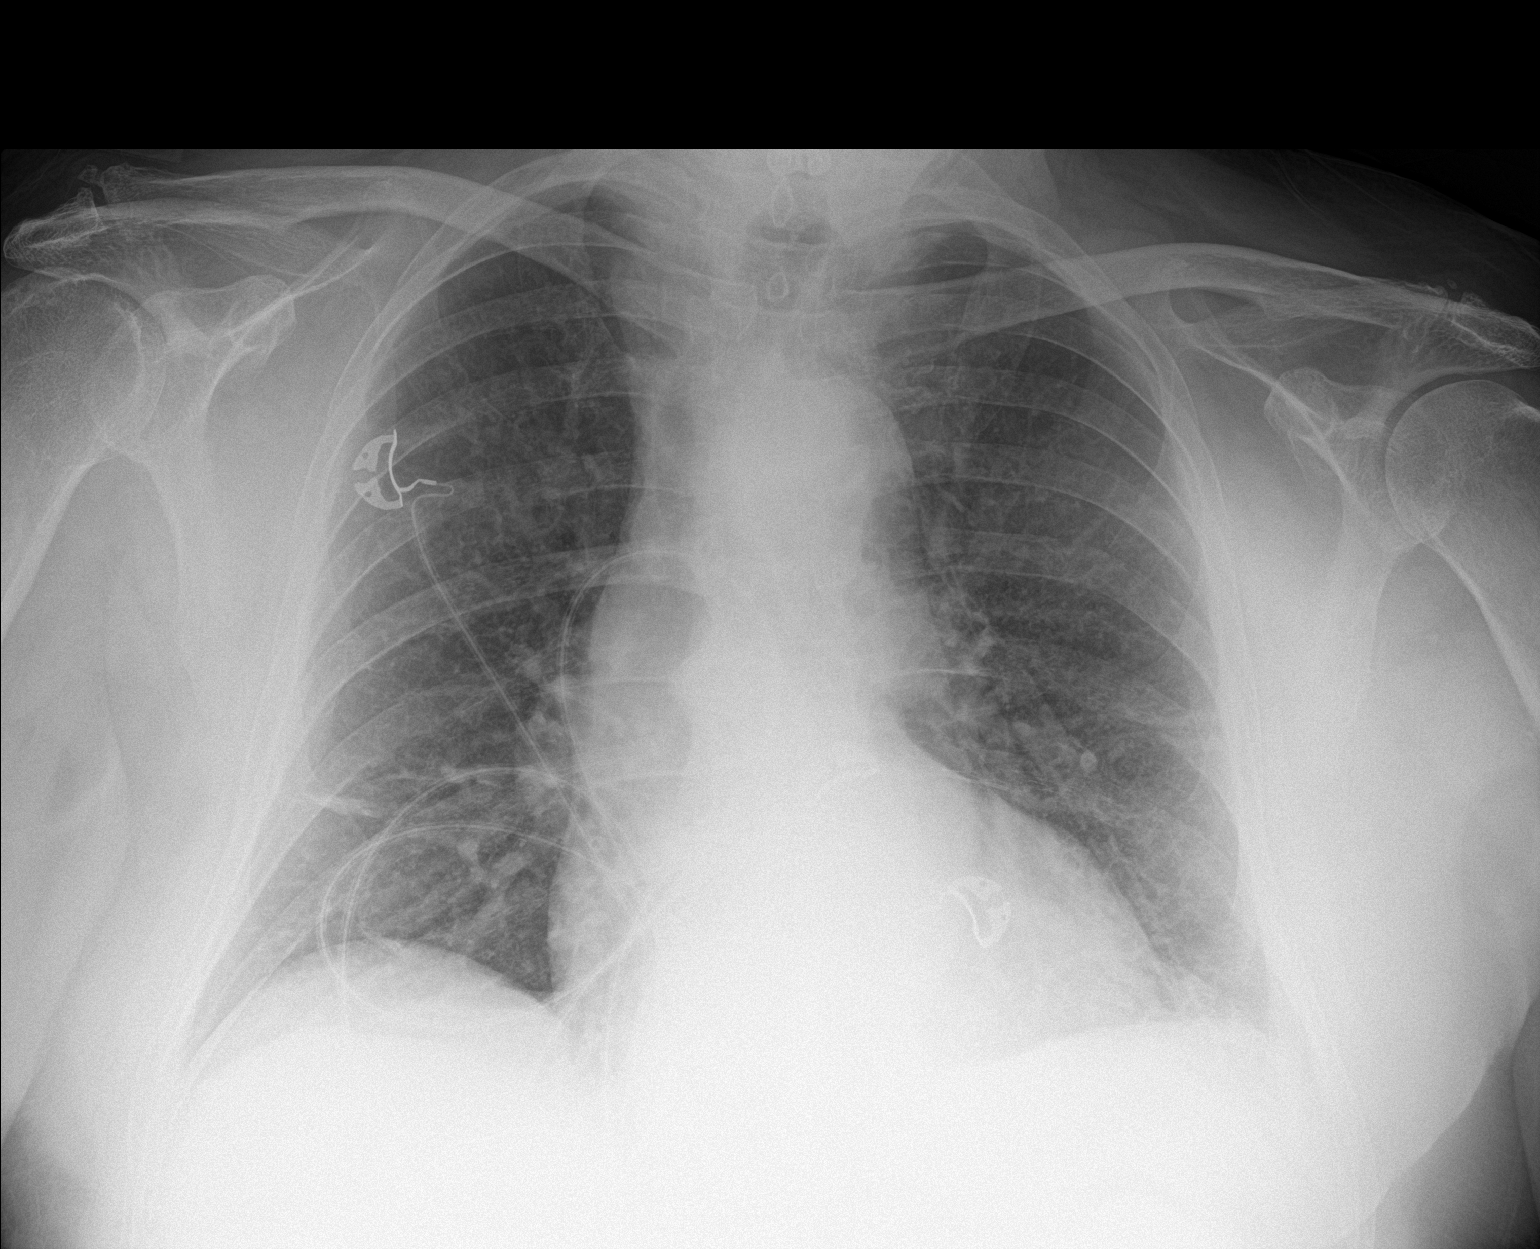

[1 of 1 positions shown; findings below may reference images not displayed]

FINDINGS: Stable cardiomediastinal silhouette. No pneumothorax or pleural
effusion is noted. Mild scarring is noted in right lung base.
Left-sided pneumonia noted on prior exam is significantly improved.
Bony thorax is unremarkable.
IMPRESSION: Significantly improved left-sided pneumonia compared to prior exam.

## 2019-03-23 ENCOUNTER — Telehealth: Payer: Self-pay | Admitting: Pulmonary Disease

## 2019-03-23 MED ORDER — TRELEGY ELLIPTA 100-62.5-25 MCG/INH IN AEPB: 1.0000 | INHALATION_SPRAY | Freq: Every day | RESPIRATORY_TRACT | 1 refills | Status: DC

## 2019-03-23 NOTE — Telephone Encounter (Signed)
Left message patient is due for 6 mos f/u with Dr.Sood. Refill submitted for Trelegy to [facility] per refill request. Nothing further needed.

## 2019-03-24 DIAGNOSIS — H25819 Combined forms of age-related cataract, unspecified eye: Secondary | ICD-10-CM | POA: Diagnosis not present

## 2019-04-09 DIAGNOSIS — I10 Essential (primary) hypertension: Secondary | ICD-10-CM | POA: Diagnosis not present

## 2019-04-09 DIAGNOSIS — E039 Hypothyroidism, unspecified: Secondary | ICD-10-CM | POA: Diagnosis not present

## 2019-04-16 DIAGNOSIS — M150 Primary generalized (osteo)arthritis: Secondary | ICD-10-CM | POA: Diagnosis not present

## 2019-04-16 DIAGNOSIS — F331 Major depressive disorder, recurrent, moderate: Secondary | ICD-10-CM | POA: Diagnosis not present

## 2019-04-16 DIAGNOSIS — K219 Gastro-esophageal reflux disease without esophagitis: Secondary | ICD-10-CM | POA: Diagnosis not present

## 2019-04-16 DIAGNOSIS — J302 Other seasonal allergic rhinitis: Secondary | ICD-10-CM | POA: Diagnosis not present

## 2019-04-16 DIAGNOSIS — G4733 Obstructive sleep apnea (adult) (pediatric): Secondary | ICD-10-CM | POA: Diagnosis not present

## 2019-04-16 DIAGNOSIS — G894 Chronic pain syndrome: Secondary | ICD-10-CM | POA: Diagnosis not present

## 2019-04-16 DIAGNOSIS — I10 Essential (primary) hypertension: Secondary | ICD-10-CM | POA: Diagnosis not present

## 2019-04-16 DIAGNOSIS — E039 Hypothyroidism, unspecified: Secondary | ICD-10-CM | POA: Diagnosis not present

## 2019-04-16 DIAGNOSIS — J449 Chronic obstructive pulmonary disease, unspecified: Secondary | ICD-10-CM | POA: Diagnosis not present

## 2019-04-16 DIAGNOSIS — M545 Low back pain: Secondary | ICD-10-CM | POA: Diagnosis not present

## 2019-04-21 DIAGNOSIS — Z23 Encounter for immunization: Secondary | ICD-10-CM | POA: Diagnosis not present

## 2019-05-04 ENCOUNTER — Other Ambulatory Visit: Payer: Self-pay | Admitting: Pulmonary Disease

## 2019-05-05 ENCOUNTER — Telehealth: Payer: Self-pay | Admitting: Pulmonary Disease

## 2019-05-05 MED ORDER — MOMETASONE FUROATE 50 MCG/ACT NA SUSP: NASAL | 0 refills | Status: DC

## 2019-05-05 NOTE — Telephone Encounter (Signed)
Spoke with patient and patient's wife.  According to Dr. Juanetta Gosling last patient instructions "Upper airway cough with post-nasal drip. - continue singulair, zyrtec - advised him that he could change to OTC nasacort or flonase in place of nasonex if one of these is less expensive"  They state they have priced the different medications and this Rx is the cheapest.   Refill sent in for patient.  Nothing further needed at this time

## 2019-05-10 ENCOUNTER — Other Ambulatory Visit: Payer: Self-pay | Admitting: Pulmonary Disease

## 2019-05-10 DIAGNOSIS — J449 Chronic obstructive pulmonary disease, unspecified: Secondary | ICD-10-CM

## 2019-05-13 DIAGNOSIS — Z961 Presence of intraocular lens: Secondary | ICD-10-CM | POA: Diagnosis not present

## 2019-05-13 DIAGNOSIS — H25813 Combined forms of age-related cataract, bilateral: Secondary | ICD-10-CM | POA: Diagnosis not present

## 2019-05-13 DIAGNOSIS — H26492 Other secondary cataract, left eye: Secondary | ICD-10-CM | POA: Diagnosis not present

## 2019-05-18 NOTE — H&P (Signed)
Surgical History & Physical  Patient Name: Cristian Yang DOB: 11/02/1934  Surgery: Cataract extraction with intraocular lens implant phacoemulsification; Right Eye  Surgeon: Baruch Goldmann MD Surgery Date:  05/28/2019 Pre-Op Date:  05/13/2019  HPI: A 73 Yr. old male patient 1. 1. The patient complains of difficulty when viewing TV, reading closed caption, news scrolls on TV, which began 6 months ago. The right eye is affected. The condition's severity increased since last visit. Symptoms occur when the patient is inside and outside. This is negatively affecting the patient's quality of life. Patient had cataract surgery OS several years ago. Vision cloudy in that eye as well. HPI Completed by Dr. Baruch Goldmann  Medical History: Cataracts Arthritis High Blood Pressure Lung Problems Thyroid Problems  Review of Systems Negative Allergic/Immunologic Negative Cardiovascular Negative Constitutional Negative Ear, Nose, Mouth & Throat Negative Endocrine Negative Eyes Negative Gastrointestinal Negative Genitourinary Negative Hemotologic/Lymphatic Negative Integumentary Negative Musculoskeletal Negative Neurological Negative Psychiatry Negative Respiratory  Social   Former smoker    Medication Aspirin, Montelukast, Omeprazole, Hydrocodone, Docusate, Losartan-Hydrochlorothiazide, Albuterol, Trelegy ellipta, Citalopram, Amlogitine, Gabapentin, Lovothyroine, One a day vitamin,    Sx/Procedures Cataract Surgery,  Skin melanomas removed, Finger sx,   Drug Allergies  Cipro, Codeine,   History & Physical: Heent:  Cataract, Right eye NECK: supple without bruits LUNGS: lungs clear to auscultation CV: regular rate and rhythm Abdomen: soft and non-tender  Impression & Plan: Assessment: 1.  COMBINED FORMS AGE RELATED CATARACT; Right Eye (H25.811) 2.  PCO; Left Eye (H26.492) 3.  INTRAOCULAR LENS IOL (Z96.1)  Plan: 1.  Cataract accounts for the patient's decreased vision. This  visual impairment is not correctable with a tolerable change in glasses or contact lenses. Cataract surgery with an implantation of a new lens should significantly improve the visual and functional status of the patient. Discussed all risks, benefits, alternatives, and potential complications. Discussed the procedures and recovery. Patient desires to have surgery. A-scan ordered and performed today for intra-ocular lens calculations. The surgery will be performed in order to improve vision for driving, reading, and for eye examinations. Recommend phacoemulsification with intra-ocular lens. Right Eye. Surgery required to correct imbalance of vision. Dilates moderately - shugacaine by protocol. Omidira. 2.  Symptomatic and visually significant. Accounts for patient's symptoms and is negatively affecting the patient's quality of life. Reviewed risks of the Yag laser including IOP spike, retinal detachment, and chronic inflammation. Patient wishes to proceed. Schedule patient for YAG capsulotomy at 1 week post-op visit for OD. 3.  PCO as above.

## 2019-05-24 DIAGNOSIS — H25811 Combined forms of age-related cataract, right eye: Secondary | ICD-10-CM | POA: Diagnosis not present

## 2019-05-26 ENCOUNTER — Encounter (HOSPITAL_COMMUNITY)
Admission: RE | Admit: 2019-05-26 | Discharge: 2019-05-26 | Disposition: A | Discharge status: Home / Self Care | Payer: Medicare Other | Source: Ambulatory Visit | Attending: Ophthalmology | Admitting: Ophthalmology

## 2019-05-26 ENCOUNTER — Other Ambulatory Visit (HOSPITAL_COMMUNITY)
Admission: RE | Admit: 2019-05-26 | Discharge: 2019-05-26 | Disposition: A | Discharge status: Home / Self Care | Payer: Medicare Other | Source: Ambulatory Visit | Attending: Ophthalmology | Admitting: Ophthalmology

## 2019-05-26 ENCOUNTER — Other Ambulatory Visit: Payer: Self-pay

## 2019-05-26 ENCOUNTER — Encounter (HOSPITAL_COMMUNITY): Payer: Self-pay

## 2019-05-26 DIAGNOSIS — Z20828 Contact with and (suspected) exposure to other viral communicable diseases: Secondary | ICD-10-CM | POA: Insufficient documentation

## 2019-05-26 DIAGNOSIS — Z01812 Encounter for preprocedural laboratory examination: Secondary | ICD-10-CM | POA: Diagnosis not present

## 2019-05-26 LAB — SARS CORONAVIRUS 2 (TAT 6-24 HRS): SARS Coronavirus 2: NEGATIVE

## 2019-05-28 ENCOUNTER — Encounter (HOSPITAL_COMMUNITY): Payer: Self-pay

## 2019-05-28 ENCOUNTER — Ambulatory Visit (HOSPITAL_COMMUNITY): Payer: Medicare Other | Admitting: Anesthesiology

## 2019-05-28 ENCOUNTER — Ambulatory Visit (HOSPITAL_COMMUNITY)
Admission: RE | Admit: 2019-05-28 | Discharge: 2019-05-28 | Disposition: A | Discharge status: Home / Self Care | Payer: Medicare Other | Attending: Ophthalmology | Admitting: Ophthalmology

## 2019-05-28 ENCOUNTER — Other Ambulatory Visit: Payer: Self-pay

## 2019-05-28 ENCOUNTER — Encounter (HOSPITAL_COMMUNITY)
Admission: RE | Disposition: A | Discharge status: Home / Self Care | Payer: Self-pay | Source: Home / Self Care | Attending: Ophthalmology

## 2019-05-28 DIAGNOSIS — I10 Essential (primary) hypertension: Secondary | ICD-10-CM | POA: Diagnosis not present

## 2019-05-28 DIAGNOSIS — Z881 Allergy status to other antibiotic agents status: Secondary | ICD-10-CM | POA: Diagnosis not present

## 2019-05-28 DIAGNOSIS — Z87891 Personal history of nicotine dependence: Secondary | ICD-10-CM | POA: Diagnosis not present

## 2019-05-28 DIAGNOSIS — K219 Gastro-esophageal reflux disease without esophagitis: Secondary | ICD-10-CM | POA: Insufficient documentation

## 2019-05-28 DIAGNOSIS — J449 Chronic obstructive pulmonary disease, unspecified: Secondary | ICD-10-CM | POA: Insufficient documentation

## 2019-05-28 DIAGNOSIS — M1990 Unspecified osteoarthritis, unspecified site: Secondary | ICD-10-CM | POA: Insufficient documentation

## 2019-05-28 DIAGNOSIS — H25811 Combined forms of age-related cataract, right eye: Secondary | ICD-10-CM | POA: Insufficient documentation

## 2019-05-28 DIAGNOSIS — Z79899 Other long term (current) drug therapy: Secondary | ICD-10-CM | POA: Diagnosis not present

## 2019-05-28 DIAGNOSIS — Z7982 Long term (current) use of aspirin: Secondary | ICD-10-CM | POA: Insufficient documentation

## 2019-05-28 DIAGNOSIS — G4730 Sleep apnea, unspecified: Secondary | ICD-10-CM | POA: Insufficient documentation

## 2019-05-28 DIAGNOSIS — Z9842 Cataract extraction status, left eye: Secondary | ICD-10-CM | POA: Insufficient documentation

## 2019-05-28 DIAGNOSIS — Z85820 Personal history of malignant melanoma of skin: Secondary | ICD-10-CM | POA: Diagnosis not present

## 2019-05-28 DIAGNOSIS — Z885 Allergy status to narcotic agent status: Secondary | ICD-10-CM | POA: Diagnosis not present

## 2019-05-28 DIAGNOSIS — Z79890 Hormone replacement therapy (postmenopausal): Secondary | ICD-10-CM | POA: Diagnosis not present

## 2019-05-28 DIAGNOSIS — Z7951 Long term (current) use of inhaled steroids: Secondary | ICD-10-CM | POA: Diagnosis not present

## 2019-05-28 DIAGNOSIS — F329 Major depressive disorder, single episode, unspecified: Secondary | ICD-10-CM | POA: Diagnosis not present

## 2019-05-28 DIAGNOSIS — E039 Hypothyroidism, unspecified: Secondary | ICD-10-CM | POA: Diagnosis not present

## 2019-05-28 DIAGNOSIS — G4733 Obstructive sleep apnea (adult) (pediatric): Secondary | ICD-10-CM | POA: Diagnosis not present

## 2019-05-28 HISTORY — PX: CATARACT EXTRACTION W/PHACO: SHX586

## 2019-05-28 SURGERY — PHACOEMULSIFICATION, CATARACT, WITH IOL INSERTION
Anesthesia: Monitor Anesthesia Care | Site: Eye | Laterality: Right

## 2019-05-28 MED ORDER — TETRACAINE HCL 0.5 % OP SOLN
1.0000 [drp] | OPHTHALMIC | Status: AC | PRN
  Administered 2019-05-28 (×3): 1 [drp] via OPHTHALMIC

## 2019-05-28 MED ORDER — PHENYLEPHRINE-KETOROLAC 1-0.3 % IO SOLN
INTRAOCULAR | Status: AC
  Filled 2019-05-28: qty 4

## 2019-05-28 MED ORDER — POVIDONE-IODINE 5 % OP SOLN
OPHTHALMIC | Status: DC | PRN
  Administered 2019-05-28: 1 via OPHTHALMIC

## 2019-05-28 MED ORDER — PROVISC 10 MG/ML IO SOLN
INTRAOCULAR | Status: DC | PRN
  Administered 2019-05-28: 0.85 mL via INTRAOCULAR

## 2019-05-28 MED ORDER — NEOMYCIN-POLYMYXIN-DEXAMETH 3.5-10000-0.1 OP SUSP
OPHTHALMIC | Status: DC | PRN
  Administered 2019-05-28: 2 [drp] via OPHTHALMIC

## 2019-05-28 MED ORDER — BSS IO SOLN
INTRAOCULAR | Status: DC | PRN
  Administered 2019-05-28: 15 mL via INTRAOCULAR

## 2019-05-28 MED ORDER — LIDOCAINE HCL (PF) 1 % IJ SOLN
INTRAOCULAR | Status: DC | PRN
  Administered 2019-05-28: 1 mL via OPHTHALMIC

## 2019-05-28 MED ORDER — EPINEPHRINE PF 1 MG/ML IJ SOLN
INTRAMUSCULAR | Status: AC
  Filled 2019-05-28: qty 1

## 2019-05-28 MED ORDER — CYCLOPENTOLATE-PHENYLEPHRINE 0.2-1 % OP SOLN
1.0000 [drp] | OPHTHALMIC | Status: AC | PRN
  Administered 2019-05-28 (×3): 1 [drp] via OPHTHALMIC

## 2019-05-28 MED ORDER — PHENYLEPHRINE HCL 2.5 % OP SOLN
1.0000 [drp] | OPHTHALMIC | Status: AC | PRN
  Administered 2019-05-28 (×3): 1 [drp] via OPHTHALMIC

## 2019-05-28 MED ORDER — PHENYLEPHRINE-KETOROLAC 1-0.3 % IO SOLN
INTRAOCULAR | Status: DC | PRN
  Administered 2019-05-28: 500 mL via OPHTHALMIC

## 2019-05-28 MED ORDER — LIDOCAINE HCL 3.5 % OP GEL
1.0000 "application " | Freq: Once | OPHTHALMIC | Status: AC
  Administered 2019-05-28: 1 via OPHTHALMIC

## 2019-05-28 MED ORDER — SODIUM HYALURONATE 23 MG/ML IO SOLN
INTRAOCULAR | Status: DC | PRN
  Administered 2019-05-28: 0.6 mL via INTRAOCULAR

## 2019-05-28 SURGICAL SUPPLY — 12 items

## 2019-05-28 NOTE — Anesthesia Postprocedure Evaluation (Signed)
Anesthesia Post Note  Patient: Cristian Yang  Procedure(s) Performed: CATARACT EXTRACTION PHACO AND INTRAOCULAR LENS PLACEMENT ([facility]) (Right Eye)  Patient location during evaluation: Short Stay Anesthesia Type: MAC Level of consciousness: awake and alert Pain management: pain level controlled Vital Signs Assessment: post-procedure vital signs reviewed and stable Respiratory status: spontaneous breathing Cardiovascular status: stable Anesthetic complications: no     Last Vitals:  Vitals:   05/28/19 0922  BP: (!) 144/83  Pulse: (!) 58  Resp: 16  Temp: 36.5 C  SpO2: 92%    Last Pain:  Vitals:   05/28/19 0922  TempSrc: Oral  PainSc: 0-No pain                 Everette Rank

## 2019-05-28 NOTE — Anesthesia Preprocedure Evaluation (Signed)
Anesthesia Evaluation  Patient identified by MRN, date of birth, ID band Patient awake    Reviewed: Allergy & Precautions, NPO status , Patient's Chart, lab work & pertinent test results  Airway Mallampati: II  TM Distance: >3 FB Neck ROM: Full    Dental no notable dental hx. (+) Teeth Intact   Pulmonary shortness of breath and with exertion, asthma , sleep apnea and Continuous Positive Airway Pressure Ventilation , pneumonia, resolved, COPD,  COPD inhaler, former smoker,    Pulmonary exam normal breath sounds clear to auscultation       Cardiovascular Exercise Tolerance: Good hypertension, Pt. on medications and Pt. on home beta blockers + DOE  Normal cardiovascular examI+ Valvular Problems/Murmurs AS  Rhythm:Regular Rate:Normal  Mild AS in 2019  Denies o2 use  Reports DOE But active  EF in 2019 55-60   Neuro/Psych Depression negative neurological ROS  negative psych ROS   GI/Hepatic Neg liver ROS, GERD  Medicated and Controlled,  Endo/Other  Hypothyroidism   Renal/GU negative Renal ROS  negative genitourinary   Musculoskeletal  (+) Arthritis , Osteoarthritis,    Abdominal   Peds negative pediatric ROS (+)  Hematology negative hematology ROS (+)   Anesthesia Other Findings   Reproductive/Obstetrics negative OB ROS                             Anesthesia Physical Anesthesia Plan  ASA: III  Anesthesia Plan: MAC   Post-op Pain Management:    Induction: Intravenous  PONV Risk Score and Plan: 1 and Treatment may vary due to age or medical condition and TIVA  Airway Management Planned: Nasal Cannula and Simple Face Mask  Additional Equipment:   Intra-op Plan:   Post-operative Plan:   Informed Consent: I have reviewed the patients History and Physical, chart, labs and discussed the procedure including the risks, benefits and alternatives for the proposed anesthesia with the  patient or authorized representative who has indicated his/her understanding and acceptance.     Dental advisory given  Plan Discussed with: CRNA  Anesthesia Plan Comments: (Plan Full PPE use  Plan MAC-WTP with same after Q&A)        Anesthesia Quick Evaluation

## 2019-05-28 NOTE — Interval H&P Note (Signed)
History and Physical Interval Note: The H and P was reviewed and updated. The patient was examined.  No changes were found after exam.  The surgical eye was marked.  05/28/2019 9:50 AM  Cristian Yang  has presented today for surgery, with the diagnosis of Nuclear sclerotic cataract - Right eye.  The various methods of treatment have been discussed with the patient and family. After consideration of risks, benefits and other options for treatment, the patient has consented to  Procedure(s) with comments: CATARACT EXTRACTION PHACO AND INTRAOCULAR LENS PLACEMENT ([facility]) (Right) - right as a surgical intervention.  [name] history has been reviewed, patient examined, no change in status, stable for surgery.  I have reviewed the patient's chart and labs.  Questions were answered to the patient's satisfaction.     Baruch Goldmann

## 2019-05-28 NOTE — Transfer of Care (Signed)
Immediate Anesthesia Transfer of Care Note  Patient: Cristian Yang  Procedure(s) Performed: CATARACT EXTRACTION PHACO AND INTRAOCULAR LENS PLACEMENT ([facility]) (Right Eye)  Patient Location: Short Stay  Anesthesia Type:MAC  Level of Consciousness: awake, alert , oriented and patient cooperative  Airway & Oxygen Therapy: Patient Spontanous Breathing  Post-op Assessment: Report given to RN and Post -op Vital signs reviewed and stable  Post vital signs: Reviewed and stable  Last Vitals:  Vitals Value Taken Time  BP    Temp    Pulse    Resp    SpO2      Last Pain:  Vitals:   05/28/19 0922  TempSrc: Oral  PainSc: 0-No pain      Patients Stated Pain Goal: 6 (92/01/00 7121)  Complications: No apparent anesthesia complications

## 2019-05-28 NOTE — Op Note (Signed)
Date of procedure: 05/28/19  Pre-operative diagnosis:  Visually significant combined form age-related cataract, Right Eye (H25.811)  Post-operative diagnosis:  Visually significant combined form age-related cataract, Right Eye (H25.811)  Procedure: Removal of cataract via phacoemulsification and insertion of intra-ocular lens [employer] PCB00  +21.0D into the capsular bag of the Right Eye  Attending surgeon: Gerda Diss. Peighton Edgin, MD, MA  Anesthesia: MAC, Topical [place]  Complications: None  Estimated Blood Loss: <62m (minimal)  Specimens: None  Implants: As above  Indications:  Visually significant age-related cataract, Right Eye  Procedure:  The patient was seen and identified in the pre-operative area. The operative eye was identified and dilated.  The operative eye was marked.  Topical anesthesia was administered to the operative eye.     The patient was then to the operative suite and placed in the supine position.  A timeout was performed confirming the patient, procedure to be performed, and all other relevant information.   [name] face was prepped and draped in the usual fashion for intra-ocular surgery.  A lid speculum was placed into the operative eye and the surgical microscope moved into place and focused.  A superotemporal paracentesis was created using a 20 gauge paracentesis blade.  Shugarcaine was injected into the anterior chamber.  Viscoelastic was injected into the anterior chamber.  A temporal clear-corneal main wound incision was created using a 2.441mmicrokeratome.  A continuous curvilinear capsulorrhexis was initiated using an irrigating cystitome and completed using capsulorrhexis forceps.  Hydrodissection and hydrodeliniation were performed.  Viscoelastic was injected into the anterior chamber.  A phacoemulsification handpiece and a chopper as a second instrument were used to remove the nucleus and epinucleus. The irrigation/aspiration handpiece was  used to remove any remaining cortical material.   The capsular bag was reinflated with viscoelastic, checked, and found to be intact.  The intraocular lens was inserted into the capsular bag.  The irrigation/aspiration handpiece was used to remove any remaining viscoelastic.  The clear corneal wound and paracentesis wounds were then hydrated and checked with Weck-Cels to be watertight.  The lid-speculum and drape was removed, and the patient's face was cleaned with a wet and dry 4x4.  Maxitrol was instilled in the eye before a clear shield was taped over the eye. The patient was taken to the post-operative care unit in good condition, having tolerated the procedure well.  Post-Op Instructions: [facility] and will receive all other orders and instructions.

## 2019-05-31 ENCOUNTER — Encounter (HOSPITAL_COMMUNITY): Payer: Self-pay | Admitting: Ophthalmology

## 2019-06-09 NOTE — Addendum Note (Signed)
Addendum  created 06/09/19 1124 by Ollen Bowl, CRNA   Charge Capture section accepted

## 2019-08-29 ENCOUNTER — Encounter (HOSPITAL_COMMUNITY): Payer: Self-pay | Admitting: Emergency Medicine

## 2019-08-29 ENCOUNTER — Other Ambulatory Visit: Payer: Self-pay

## 2019-08-29 ENCOUNTER — Inpatient Hospital Stay (HOSPITAL_COMMUNITY)
Admission: EM | Admit: 2019-08-29 | Discharge: 2019-09-01 | DRG: 177 | Disposition: A | Discharge status: Home Health | Payer: Medicare Other | Attending: Family Medicine | Admitting: Family Medicine

## 2019-08-29 ENCOUNTER — Emergency Department (HOSPITAL_COMMUNITY): Payer: Medicare Other

## 2019-08-29 DIAGNOSIS — Z800 Family history of malignant neoplasm of digestive organs: Secondary | ICD-10-CM

## 2019-08-29 DIAGNOSIS — J449 Chronic obstructive pulmonary disease, unspecified: Secondary | ICD-10-CM | POA: Diagnosis present

## 2019-08-29 DIAGNOSIS — R52 Pain, unspecified: Secondary | ICD-10-CM | POA: Diagnosis not present

## 2019-08-29 DIAGNOSIS — J9621 Acute and chronic respiratory failure with hypoxia: Secondary | ICD-10-CM | POA: Diagnosis present

## 2019-08-29 DIAGNOSIS — J9601 Acute respiratory failure with hypoxia: Secondary | ICD-10-CM | POA: Diagnosis not present

## 2019-08-29 DIAGNOSIS — F329 Major depressive disorder, single episode, unspecified: Secondary | ICD-10-CM | POA: Diagnosis present

## 2019-08-29 DIAGNOSIS — S82401A Unspecified fracture of shaft of right fibula, initial encounter for closed fracture: Secondary | ICD-10-CM | POA: Diagnosis present

## 2019-08-29 DIAGNOSIS — S82891A Other fracture of right lower leg, initial encounter for closed fracture: Secondary | ICD-10-CM

## 2019-08-29 DIAGNOSIS — J690 Pneumonitis due to inhalation of food and vomit: Secondary | ICD-10-CM | POA: Diagnosis not present

## 2019-08-29 DIAGNOSIS — Z79899 Other long term (current) drug therapy: Secondary | ICD-10-CM

## 2019-08-29 DIAGNOSIS — R55 Syncope and collapse: Secondary | ICD-10-CM | POA: Diagnosis present

## 2019-08-29 DIAGNOSIS — I959 Hypotension, unspecified: Secondary | ICD-10-CM | POA: Diagnosis not present

## 2019-08-29 DIAGNOSIS — Z20822 Contact with and (suspected) exposure to covid-19: Secondary | ICD-10-CM | POA: Diagnosis not present

## 2019-08-29 DIAGNOSIS — Z881 Allergy status to other antibiotic agents status: Secondary | ICD-10-CM

## 2019-08-29 DIAGNOSIS — E039 Hypothyroidism, unspecified: Secondary | ICD-10-CM | POA: Diagnosis present

## 2019-08-29 DIAGNOSIS — S82831A Other fracture of upper and lower end of right fibula, initial encounter for closed fracture: Secondary | ICD-10-CM | POA: Diagnosis present

## 2019-08-29 DIAGNOSIS — R61 Generalized hyperhidrosis: Secondary | ICD-10-CM | POA: Diagnosis not present

## 2019-08-29 DIAGNOSIS — R918 Other nonspecific abnormal finding of lung field: Secondary | ICD-10-CM | POA: Diagnosis not present

## 2019-08-29 DIAGNOSIS — J440 Chronic obstructive pulmonary disease with acute lower respiratory infection: Secondary | ICD-10-CM | POA: Diagnosis present

## 2019-08-29 DIAGNOSIS — R0902 Hypoxemia: Secondary | ICD-10-CM

## 2019-08-29 DIAGNOSIS — R4189 Other symptoms and signs involving cognitive functions and awareness: Secondary | ICD-10-CM

## 2019-08-29 DIAGNOSIS — K219 Gastro-esophageal reflux disease without esophagitis: Secondary | ICD-10-CM | POA: Diagnosis present

## 2019-08-29 DIAGNOSIS — Z85828 Personal history of other malignant neoplasm of skin: Secondary | ICD-10-CM

## 2019-08-29 DIAGNOSIS — J40 Bronchitis, not specified as acute or chronic: Secondary | ICD-10-CM | POA: Diagnosis not present

## 2019-08-29 DIAGNOSIS — W000XXA Fall on same level due to ice and snow, initial encounter: Secondary | ICD-10-CM | POA: Diagnosis present

## 2019-08-29 DIAGNOSIS — E785 Hyperlipidemia, unspecified: Secondary | ICD-10-CM | POA: Diagnosis present

## 2019-08-29 DIAGNOSIS — I10 Essential (primary) hypertension: Secondary | ICD-10-CM | POA: Diagnosis present

## 2019-08-29 DIAGNOSIS — Z833 Family history of diabetes mellitus: Secondary | ICD-10-CM

## 2019-08-29 DIAGNOSIS — R4182 Altered mental status, unspecified: Secondary | ICD-10-CM | POA: Diagnosis not present

## 2019-08-29 DIAGNOSIS — Z825 Family history of asthma and other chronic lower respiratory diseases: Secondary | ICD-10-CM

## 2019-08-29 DIAGNOSIS — S82431A Displaced oblique fracture of shaft of right fibula, initial encounter for closed fracture: Secondary | ICD-10-CM | POA: Diagnosis not present

## 2019-08-29 DIAGNOSIS — S8261XA Displaced fracture of lateral malleolus of right fibula, initial encounter for closed fracture: Secondary | ICD-10-CM | POA: Diagnosis not present

## 2019-08-29 DIAGNOSIS — R05 Cough: Secondary | ICD-10-CM | POA: Diagnosis not present

## 2019-08-29 DIAGNOSIS — G4733 Obstructive sleep apnea (adult) (pediatric): Secondary | ICD-10-CM | POA: Diagnosis present

## 2019-08-29 DIAGNOSIS — Z87891 Personal history of nicotine dependence: Secondary | ICD-10-CM

## 2019-08-29 DIAGNOSIS — R609 Edema, unspecified: Secondary | ICD-10-CM | POA: Diagnosis not present

## 2019-08-29 DIAGNOSIS — Z8249 Family history of ischemic heart disease and other diseases of the circulatory system: Secondary | ICD-10-CM

## 2019-08-29 DIAGNOSIS — R000 Tachycardia, unspecified: Secondary | ICD-10-CM | POA: Diagnosis present

## 2019-08-29 DIAGNOSIS — Z79890 Hormone replacement therapy (postmenopausal): Secondary | ICD-10-CM

## 2019-08-29 NOTE — ED Provider Notes (Addendum)
[facility] EMERGENCY DEPARTMENT Provider Note   CSN: [id number] Arrival date & time: 08/29/19  2331   Time seen 11:33 PM  History Chief Complaint  Patient presents with  . Ankle Injury    Cristian Yang is a 85 y.o. male.  HPI Patient states about noon today he was outside in his flip-flops walking in the snow taking his trash can out to the road and the flip-flop broke and he slipped in the snow and injured his right ankle.  He complains of a lot of pain in his ankle however he has been walking on it all day.  Prior to arrival his wife gave him a pain pill which may be gabapentin after which he got diaphoretic dizzy.  EMS reports his initial blood pressure was 85/50.  His pulse ox was 88% on room air.  Patient is not on oxygen at home.  He states he last ate about 1015 when he ate some cookies so he could take his pain pills.  He denies having a orthopedist in the past.  He denies hitting his head when he fell.  He denies any other injury.  Patient states he has a heart murmur.  PCP Celene Squibb, MD     Past Medical History:  Diagnosis Date  . Arthritis   . Asthma   . Chronic back pain   . COPD (chronic obstructive pulmonary disease) ([place])   . Depression   . GERD (gastroesophageal reflux disease)   . Hyperlipidemia   . Hypertension   . Hypothyroidism   . OSA on CPAP   . Skin cancer   . Sleep apnea    uses CPAP nightly    Patient Active Problem List   Diagnosis Date Noted  . Aortic stenosis 12/09/2018  . Acute respiratory failure with hypoxia ([place]) 07/10/2018  . Right wrist fracture, closed, initial encounter   . COPD exacerbation ([place]) 07/09/2018  . Orthostatic hypotension 07/09/2018  . Closed distal radius fracture 07/09/18 07/09/2018  . Chronic back pain   . Pneumonia 11/27/2017  . Cough 11/26/2017  . Acute respiratory failure ([place]) 11/05/2017  . Medication refill 12/24/2016  . Right ankle pain 12/09/2016  . Ganglion cyst 09/21/2016  . Restless leg  09/21/2016  . Chronic obstructive pulmonary disease ([place]) 04/12/2015  . OSA on CPAP 06/02/2014  . GERD (gastroesophageal reflux disease) 06/02/2014  . Hyperlipidemia 06/02/2014  . Arthritis 06/02/2014  . Hypertension 06/02/2014    Past Surgical History:  Procedure Laterality Date  . CATARACT EXTRACTION W/PHACO Left 02/13/2016   Procedure: CATARACT EXTRACTION PHACO AND INTRAOCULAR LENS PLACEMENT (IOC);  Surgeon: Rutherford Guys, MD;  Location: [facility];  Service: Ophthalmology;  Laterality: Left;  CDE: 9.23  . CATARACT EXTRACTION W/PHACO Right 05/28/2019   Procedure: CATARACT EXTRACTION PHACO AND INTRAOCULAR LENS PLACEMENT (IOC);  Surgeon: Baruch Goldmann, MD;  Location: [facility];  Service: Ophthalmology;  Laterality: Right;  CDE: 9.51  . NECK SURGERY     cervical disc  . PROSTATE SURGERY     TURP, laser  . SKIN CANCER EXCISION  2013  . skin cancer removed     x3       Family History  Problem Relation Age of Onset  . Diabetes Mother   . Heart disease Mother   . Liver cancer Brother   . Bone cancer Sister   . Asthma Father     Social History   Tobacco Use  . Smoking status: Former Smoker    Packs/day: 2.00  Years: 30.00    Pack years: 60.00    Types: Cigarettes    Quit date: 07/23/2003    Years since quitting: 16.1  . Smokeless tobacco: Never Used  Substance Use Topics  . Alcohol use: No    Alcohol/week: 0.0 standard drinks    Comment: quit 2005  . Drug use: No  lives at home Lives with spouse  Home Medications Prior to Admission medications   Medication Sig Start Date End Date Taking? Authorizing Provider  albuterol (PROVENTIL) (2.5 MG/3ML) 0.083% nebulizer solution USE 1 VIAL IN NEBULIZER ONCE DAILY AS DIRECTED. Patient taking differently: Take 2.5 mg by nebulization daily as needed for wheezing or shortness of breath.  05/10/19   Chesley Mires, MD  amLODipine (NORVASC) 2.5 MG tablet Take 2.5 mg by mouth daily.    [provider]  amoxicillin (AMOXIL) 500 MG  capsule Take 1 capsule (500 mg total) by mouth 3 (three) times daily. 08/30/19   Rolland Porter, MD  calcium elemental as carbonate (TUMS ULTRA 1000) 400 MG chewable tablet Chew 3,000 mg by mouth at bedtime.     [provider]  citalopram (CELEXA) 20 MG tablet Take 1 tablet (20 mg total) by mouth at bedtime. 10/15/18   Bufford Lope, DO  docusate sodium (COLACE) 100 MG capsule TAKE 1 CAPSULE BY MOUTH IN THE MORNING Patient taking differently: Take 100 mg by mouth daily.  01/08/19   [place] Bing, DO  gabapentin (NEURONTIN) 300 MG capsule TAKE 1 CAPSULE BY MOUTH IN THE MORNING TAKE 2 CAPSULE IN THE EVENING Patient taking differently: Take 300 mg by mouth 3 (three) times daily. TAKE 1 CAPSULE BY MOUTH IN THE MORNING TAKE 2 CAPSULE IN THE EVENING 05/01/18   [place] Bing, DO  GNP ASPIRIN LOW DOSE 81 MG EC tablet TAKE 1 TABLET BY MOUTH ONCE DAILY. Patient taking differently: Take 81 mg by mouth daily.  06/30/18   [place] Bing, DO  HYDROcodone-acetaminophen (NORCO/VICODIN) 5-325 MG tablet Take 1 tablet by mouth every 6 (six) hours as needed for moderate pain or severe pain. 08/30/19   Rolland Porter, MD  levothyroxine (SYNTHROID, LEVOTHROID) 75 MCG tablet Take 1 tablet (75 mcg total) by mouth daily before breakfast. 10/15/18   Bufford Lope, DO  lidocaine (XYLOCAINE) 5 % ointment Apply 1 application topically 3 (three) times daily as needed. Apply to affected area Patient not taking: Reported on 05/19/2019 11/21/17   [place] Alpha, DO  losartan (COZAAR) 100 MG tablet Take 1 tablet (100 mg total) by mouth at bedtime. 10/15/18   Bufford Lope, DO  mometasone (NASONEX) 50 MCG/ACT nasal spray SPRAY 2 SPRAYS INTO EACH NOSTRIL ONCE DAILY Patient taking differently: Place 2 sprays into the nose daily.  05/05/19   Chesley Mires, MD  montelukast (SINGULAIR) 10 MG tablet Take 1 tablet (10 mg total) by mouth at bedtime. 10/15/18   Bufford Lope, DO  multivitamin (ONE-A-DAY MEN'S) TABS tablet Take 1 tablet by  mouth every morning. 10/15/18   Bufford Lope, DO  omeprazole (PRILOSEC) 20 MG capsule Take 1 capsule (20 mg total) by mouth every morning. 10/15/18   Bufford Lope, DO  PRESCRIPTION MEDICATION Patient uses CPAP at night    [provider]  TRELEGY ELLIPTA 100-62.5-25 MCG/INH AEPB INHALE 1 PUFF INTO THE LUNGS ONCE DAILY. Patient taking differently: Inhale 1 puff into the lungs daily.  05/05/19   Chesley Mires, MD    Allergies    Ciprofloxacin and Codeine  Review  of Systems   Review of Systems  All other systems reviewed and are negative.   Physical Exam Updated Vital Signs BP 108/61   Pulse 87   Temp 98.9 F (37.2 C) (Oral)   Resp (!) 24   Ht 5\' 8"  (1.727 m)   Wt 100.7 kg   SpO2 94%   BMI 33.75 kg/m   Physical Exam Vitals and nursing note reviewed.  Constitutional:      Appearance: Normal appearance.  HENT:     Head: Normocephalic and atraumatic.     Right Ear: External ear normal.     Left Ear: External ear normal.  Eyes:     Extraocular Movements: Extraocular movements intact.     Conjunctiva/sclera: Conjunctivae normal.     Pupils: Pupils are equal, round, and reactive to light.  Cardiovascular:     Rate and Rhythm: Normal rate and regular rhythm.     Heart sounds: Murmur present.  Pulmonary:     Effort: Prolonged expiration present. No respiratory distress.     Comments: Patient appears to be a little short of breath but he denies feeling short of breath. Musculoskeletal:        General: Swelling and tenderness present.     Cervical back: Normal range of motion.     Comments: Patient has swelling and bruising over the lateral aspect of his right ankle and foot.  He is nontender to palpation in the right knee.  There is no swelling around the right knee.  He has good distal pulses.  Skin:    General: Skin is warm and dry.     Findings: Bruising present.  Neurological:     General: No focal deficit present.     Mental Status: He is alert and oriented to  person, place, and time.     Cranial Nerves: No cranial nerve deficit.  Psychiatric:        Mood and Affect: Mood normal.        Behavior: Behavior normal.        Thought Content: Thought content normal.       ED Results / Procedures / Treatments   Labs (all labs ordered are listed, but only abnormal results are displayed) Results for orders placed or performed during the hospital encounter of 08/29/19  Respiratory Panel by RT PCR (Flu A&B, Covid) - Nasopharyngeal Swab   Specimen: Nasopharyngeal Swab  Result Value Ref Range   SARS Coronavirus 2 by RT PCR NEGATIVE NEGATIVE   Influenza A by PCR NEGATIVE NEGATIVE   Influenza B by PCR NEGATIVE NEGATIVE  Comprehensive metabolic panel  Result Value Ref Range   Sodium 132 (L) 135 - 145 mmol/L   Potassium 4.3 3.5 - 5.1 mmol/L   Chloride 98 98 - 111 mmol/L   CO2 29 22 - 32 mmol/L   Glucose, Bld 157 (H) 70 - 99 mg/dL   BUN 14 8 - 23 mg/dL   Creatinine, Ser 1.07 0.61 - 1.24 mg/dL   Calcium 8.6 (L) 8.9 - 10.3 mg/dL   Total Protein 7.1 6.5 - 8.1 g/dL   Albumin 4.4 3.5 - 5.0 g/dL   AST 19 15 - 41 U/L   ALT 15 0 - 44 U/L   Alkaline Phosphatase 66 38 - 126 U/L   Total Bilirubin 0.8 0.3 - 1.2 mg/dL   GFR calc non Af Amer >60 >60 mL/min   GFR calc Af Amer >60 >60 mL/min   Anion gap 5 5 - 15  CBC  with Differential  Result Value Ref Range   WBC 12.0 (H) 4.0 - 10.5 K/uL   RBC 4.03 (L) 4.22 - 5.81 MIL/uL   Hemoglobin 13.1 13.0 - 17.0 g/dL   HCT 39.6 39.0 - 52.0 %   MCV 98.3 80.0 - 100.0 fL   MCH 32.5 26.0 - 34.0 pg   MCHC 33.1 30.0 - 36.0 g/dL   RDW 12.7 11.5 - 15.5 %   Platelets 280 150 - 400 K/uL   nRBC 0.0 0.0 - 0.2 %   Neutrophils Relative % 70 %   Neutro Abs 8.3 (H) 1.7 - 7.7 K/uL   Lymphocytes Relative 12 %   Lymphs Abs 1.5 0.7 - 4.0 K/uL   Monocytes Relative 18 %   Monocytes Absolute 2.2 (H) 0.1 - 1.0 K/uL   Eosinophils Relative 0 %   Eosinophils Absolute 0.0 0.0 - 0.5 K/uL   Basophils Relative 0 %   Basophils Absolute  0.0 0.0 - 0.1 K/uL   Immature Granulocytes 0 %   Abs Immature Granulocytes 0.02 0.00 - 0.07 K/uL  Brain natriuretic peptide  Result Value Ref Range   B Natriuretic Peptide 45.0 0.0 - 100.0 pg/mL  D-dimer, quantitative  Result Value Ref Range   D-Dimer, Quant 0.99 (H) 0.00 - 0.50 ug/mL-FEU  Troponin I (High Sensitivity)  Result Value Ref Range   Troponin I (High Sensitivity) <2.0 <18 ng/L   Laboratory interpretation all normal except leukocytosis, elevated D-dimer even when corrected for age    EKG EKG Interpretation  Date/Time:  Sunday August 29 2019 23:46:09 EST Ventricular Rate:  54 PR Interval:    QRS Duration: 171 QT Interval:  482 QTC Calculation: 457 R Axis:   -77 Text Interpretation: Sinus rhythm Prolonged PR interval RBBB and LAFB No significant change since last tracing 09 Jul 2018 Confirmed by Rolland Porter [phone]) on 08/29/2019 11:49:27 PM   #2 EKG after unresponsive episode  EKG Interpretation  Date/Time:  Monday August 30 2019 02:26:24 EST Ventricular Rate:  55 PR Interval:    QRS Duration: 172 QT Interval:  480 QTC Calculation: 460 R Axis:   -77 Text Interpretation: Sinus rhythm RBBB and LAFB No significant change since last tracing about 3 hours before Confirmed by Rolland Porter [phone]) on 08/30/2019 2:55:11 AM        Radiology DG Ankle Complete Right  Result Date: 08/30/2019 CLINICAL DATA:  [place] EXAM: RIGHT ANKLE - COMPLETE 3+ VIEW COMPARISON:  None. FINDINGS: Obliquely oriented distal fibular fracture with trans syndesmotic extension. No visualized medial or posterior malleolar fractures are identified though suspect syndesmotic disruption given the presence of lateral talar shift and angulation. Circumferential soft tissue swelling and large ankle joint effusion is present. No other acute fracture is seen. Background of mild degenerative spurring of the ankle, midfoot and hindfoot. IMPRESSION: Obliquely oriented distal fibular fracture and lateral  talar shift with angulation. Pattern is suggestive of a Weber B stage III/IV supination external rotation injury, raising concern for possible disruption of the posterior syndesmosis and medial collateral bands. Associated swelling and ankle effusion. Electronically Signed   By: Lovena Le M.D.   On: 08/30/2019 00:19   DG Chest Port 1 View  Result Date: 08/30/2019 CLINICAL DATA:  Cough and hypoxia. EXAM: PORTABLE CHEST 1 VIEW COMPARISON:  07/09/2018 FINDINGS: Artifact overlies the chest. Chronically prominent interstitial lung markings. No sign of active consolidation, collapse or effusion. No acute bone finding. IMPRESSION: No active disease detected.  Chronic interstitial lung markings. Electronically Signed  By: Nelson Chimes M.D.   On: 08/30/2019 01:13     CT Head Wo Contrast  Result Date: 08/30/2019 CLINICAL DATA:  85 year old male with abrupt altered mental status, coughed and then became pale and unresponsive. EXAM: CT HEAD WITHOUT CONTRAST TECHNIQUE: Contiguous axial images were obtained from the base of the skull through the vertex without intravenous contrast. COMPARISON:  Head CT 11/05/2017. FINDINGS: Brain: Stable cerebral volume. No midline shift, mass effect, or evidence of intracranial mass lesion. No ventriculomegaly. Chronic confluent bilateral cerebral white matter hypodensity. Chronic deep white matter capsule involvement. Relative sparing of the basal ganglia. Stable gray-white matter differentiation throughout the brain. No acute intracranial hemorrhage identified. No cortically based acute infarct identified. Vascular: Calcified atherosclerosis at the skull base. No suspicious intracranial vascular hyperdensity. Skull: No acute osseous abnormality identified. Sinuses/Orbits: Scattered bilateral ethmoid sinus mucosal thickening appears chronic and mildly improved. Other Visualized paranasal sinuses and mastoids are stable and well pneumatized. Other: Interval postoperative changes to  the right globe. No acute orbit or scalp soft tissue finding. IMPRESSION: No acute intracranial abnormality. Stable non contrast CT appearance of the brain with chronic small vessel disease. Electronically Signed   By: Genevie [name] M.D.   On: 08/30/2019 04:59   CT Angio Chest PE W/Cm &/Or Wo Cm  Result Date: 08/30/2019 CLINICAL DATA:  85 year old male with abrupt altered mental status, coughed and then became pale and unresponsive. EXAM: CT ANGIOGRAPHY CHEST WITH CONTRAST TECHNIQUE: Multidetector CT imaging of the chest was performed using the standard protocol during bolus administration of intravenous contrast. Multiplanar CT image reconstructions and MIPs were obtained to evaluate the vascular anatomy. CONTRAST:  67mL OMNIPAQUE IOHEXOL 350 MG/ML SOLN COMPARISON:  Portable chest earlier tonight. CT Abdomen and Pelvis 11/05/2017. FINDINGS: Cardiovascular: Adequate contrast bolus timing in the pulmonary arterial tree. However there is significant respiratory motion. There is no central or hilar pulmonary artery filling defect identified. Upper lobe branch detail is superior to lower lobe detail. No convincing upper lobe pulmonary artery filling defect. Lower lobe branches are significantly obscured and not well evaluated. Cardiomegaly. No pericardial effusion. Negative visible aorta aside from mild atherosclerosis. No definite calcified coronary artery atherosclerosis. Mediastinum/Nodes: Chronic hiatal hernia contained mostly fat in 2019, now contains a small portion of the stomach also. No mediastinal or hilar lymphadenopathy. Lungs/Pleura: Respiratory motion which also involves the major airways. Grossly patent major airways. The right upper lobe is clear. There is patchy and tree-in-bud nodularity in the posterior left upper lobe on series 6, image 54. There is confluent bilateral lower lobe peribronchial opacity with areas of early consolidation, primarily involving medial and posterior basal segments and greater  on the left. Less pronounced involvement of the costophrenic angles. Minimal middle lobe peribronchial involvement. No pleural effusion. Upper Abdomen: Negative visible liver, gallbladder, spleen, adrenal glands and kidneys. Partially visible pancreatic atrophy. Negative visible bowel aside from the small gastric hiatal hernia. Musculoskeletal: Osteopenia. Partially visible lower cervical ACDF. Thoracic spine degeneration. No acute or suspicious osseous lesion. Review of the MIP images confirms the above findings. IMPRESSION: 1. Limited evaluation for pulmonary embolus due to respiratory motion. No central or hilar pulmonary embolus identified. 2. Bilateral Pneumonia with confluent bilateral lower lobe peribronchial opacity, involvement of the posterior left upper lobe. Aspiration should be considered in this setting. No pleural effusion. 3. Cardiomegaly. Aortic Atherosclerosis (ICD10-I70.0). 4. Small to moderate hiatal hernia. Electronically Signed   By: Genevie [name] M.D.   On: 08/30/2019 04:57      Procedures .Critical  Care Performed by: Rolland Porter, MD Authorized by: Rolland Porter, MD   Critical care provider statement:    Critical care time (minutes):  39   Critical care was necessary to treat or prevent imminent or life-threatening deterioration of the following conditions:  Respiratory failure and circulatory failure   Critical care was time spent personally by me on the following activities:  Discussions with consultants, examination of patient, obtaining history from patient or surrogate, ordering and review of laboratory studies, ordering and review of radiographic studies, pulse oximetry and re-evaluation of patient's condition   (including critical care time)  Medications Ordered in ED Medications  ipratropium (ATROVENT) 0.02 % nebulizer solution (  Not Given 08/30/19 0307)  albuterol (VENTOLIN) (5 MG/ML) 0.5% continuous inhalation solution (  Not Given 08/30/19 0306)  Ampicillin-Sulbactam  (UNASYN) 3 g in sodium chloride 0.9 % 100 mL IVPB (has no administration in time range)  albuterol (PROVENTIL,VENTOLIN) solution continuous neb (10 mg/hr Nebulization Given 08/30/19 0303)  ipratropium (ATROVENT) nebulizer solution 0.5 mg (0.5 mg Nebulization Given 08/30/19 0304)  sodium chloride 0.9 % bolus 500 mL (0 mLs Intravenous Stopped 08/30/19 0445)  methylPREDNISolone sodium succinate (SOLU-MEDROL) 125 mg/2 mL injection 125 mg (125 mg Intravenous Given 08/30/19 0257)  iohexol (OMNIPAQUE) 350 MG/ML injection 75 mL (75 mLs Intravenous Contrast Given 08/30/19 0424)    ED Course  I have reviewed the triage vital signs and the nursing notes.  Pertinent labs & imaging results that were available during my care of the patient were reviewed by me and considered in my medical decision making (see chart for details).    MDM Rules/Calculators/A&P                     X-rays were obtained of his right ankle for suspected fracture.  EMS reports he was initially hypotensive and his pulse ox was low.  I think patient had a near vasovagal episode due to pain because he was walking on his fractured ankle.  I was going to do a CT of his ankle however we CT tech pointed out he would not show the tendon injury suspected on his x-rays, patient will need to have an MRI done as an outpatient.  He will be referred to the orthopedist and they can decide if they need it or not.   12:50 AM I went in to see the patient.  His blood pressure is 122/53 pulse ox is 99% on 2 L nasal cannula.  I turned off his oxygen.  I noticed patient was coughing a lot and he states that just started.  He denies have any fever.  He states he used to smoke but he does not currently.  Cam walker was ordered for the patient, and a portable chest x-ray to look for pneumonia.  Patient states he has not taken the Covid vaccine.  He denies being around anybody else who is sick.  1:15 AM patient's pulse ox is 93 to 94% on room air.  Review of the  [place] shows patient gets #90 hydrocodone 5/325 every 1 to 2 months by Kari Baars at his PCP office.  Last filled July 12, 2019.  2:20 AM when nurse put patient in a wheelchair to go home he had a unresponsive episode with no pulse for about 30 seconds.  He was placed back on the stretcher.  He awakened, his color was pale.  He states he feels bad but cannot tell me how.  He denies nausea or  vomiting.  He still having a lot of coughing.  His initial pulse ox was in the 70s, he was placed back on his nasal cannula oxygen and it went up to 93%.  Initial blood pressure 73/59, he was given 500 cc bolus of fluid.  His initial heart rate was in the 40s but at 2:30 AM it was 62.  Recheck at 3:30 AM patient seems restless.  I asked him what is wrong and he states "I do not feel good".  When asking what does that mean he states he started having a headache.  We will do head CT and CTA to look for PE.  Patient's CTA shows bilateral pneumonia with confluent bilateral lower lobe peribronchial opacity and involvement of the posterior left upper lobe, radiologist felt aspiration should be considered.  We will wait to talk to the hospitalist before starting antibiotics.  His head CT does not show anything acute.  Will talk to the hospitalist about admission.  5:25 AM Dr. Darrick Meigs, hospitalist will admit.  We discussed his aspiration pneumonia and he would like to start him on Unasyn, the first dose was ordered by me.  Final Clinical Impression(s) / ED Diagnoses Final diagnoses:  Closed fracture of right ankle, initial encounter  Bronchitis  Unresponsive episode  Aspiration pneumonia of both lungs, unspecified aspiration pneumonia type, unspecified part of lung ([place])  Hypoxia  Vasovagal syncope    Rx / DC Orders ED Discharge Orders         Ordered    HYDROcodone-acetaminophen (NORCO/VICODIN) 5-325 MG tablet  Every 6 hours PRN     08/30/19 0137    amoxicillin (AMOXIL) 500 MG capsule  3 times  daily,   Status:  Discontinued     08/30/19 0137    amoxicillin (AMOXIL) 500 MG capsule  3 times daily     08/30/19 0139        Pt was going to be discharged, but was admitted.    Plan admission   Rolland Porter, MD, Barbette Or, MD 08/30/19 [id number]    Rolland Porter, MD 08/30/19 [phone]

## 2019-08-29 NOTE — ED Triage Notes (Signed)
Pt arrives via [facility] w/complaints of R ankle pain. Pt fell walking outside in flip flops @ 12pm. Pt been walking on ankle during the day. Pt has bruising & swelling to RLE.

## 2019-08-30 ENCOUNTER — Emergency Department (HOSPITAL_COMMUNITY): Payer: Medicare Other

## 2019-08-30 DIAGNOSIS — Z79890 Hormone replacement therapy (postmenopausal): Secondary | ICD-10-CM | POA: Diagnosis not present

## 2019-08-30 DIAGNOSIS — I959 Hypotension, unspecified: Secondary | ICD-10-CM | POA: Diagnosis present

## 2019-08-30 DIAGNOSIS — Z825 Family history of asthma and other chronic lower respiratory diseases: Secondary | ICD-10-CM | POA: Diagnosis not present

## 2019-08-30 DIAGNOSIS — Z20822 Contact with and (suspected) exposure to covid-19: Secondary | ICD-10-CM | POA: Diagnosis present

## 2019-08-30 DIAGNOSIS — R55 Syncope and collapse: Secondary | ICD-10-CM | POA: Diagnosis present

## 2019-08-30 DIAGNOSIS — J440 Chronic obstructive pulmonary disease with acute lower respiratory infection: Secondary | ICD-10-CM | POA: Diagnosis present

## 2019-08-30 DIAGNOSIS — Z79899 Other long term (current) drug therapy: Secondary | ICD-10-CM | POA: Diagnosis not present

## 2019-08-30 DIAGNOSIS — R918 Other nonspecific abnormal finding of lung field: Secondary | ICD-10-CM | POA: Diagnosis not present

## 2019-08-30 DIAGNOSIS — W000XXA Fall on same level due to ice and snow, initial encounter: Secondary | ICD-10-CM | POA: Diagnosis present

## 2019-08-30 DIAGNOSIS — J40 Bronchitis, not specified as acute or chronic: Secondary | ICD-10-CM | POA: Diagnosis present

## 2019-08-30 DIAGNOSIS — R05 Cough: Secondary | ICD-10-CM | POA: Diagnosis not present

## 2019-08-30 DIAGNOSIS — E785 Hyperlipidemia, unspecified: Secondary | ICD-10-CM | POA: Diagnosis present

## 2019-08-30 DIAGNOSIS — Z8249 Family history of ischemic heart disease and other diseases of the circulatory system: Secondary | ICD-10-CM | POA: Diagnosis not present

## 2019-08-30 DIAGNOSIS — Z881 Allergy status to other antibiotic agents status: Secondary | ICD-10-CM | POA: Diagnosis not present

## 2019-08-30 DIAGNOSIS — Z833 Family history of diabetes mellitus: Secondary | ICD-10-CM | POA: Diagnosis not present

## 2019-08-30 DIAGNOSIS — I10 Essential (primary) hypertension: Secondary | ICD-10-CM | POA: Diagnosis present

## 2019-08-30 DIAGNOSIS — S82431A Displaced oblique fracture of shaft of right fibula, initial encounter for closed fracture: Secondary | ICD-10-CM | POA: Diagnosis not present

## 2019-08-30 DIAGNOSIS — R0902 Hypoxemia: Secondary | ICD-10-CM | POA: Diagnosis not present

## 2019-08-30 DIAGNOSIS — J9601 Acute respiratory failure with hypoxia: Secondary | ICD-10-CM | POA: Diagnosis present

## 2019-08-30 DIAGNOSIS — R4189 Other symptoms and signs involving cognitive functions and awareness: Secondary | ICD-10-CM | POA: Diagnosis not present

## 2019-08-30 DIAGNOSIS — R000 Tachycardia, unspecified: Secondary | ICD-10-CM | POA: Diagnosis present

## 2019-08-30 DIAGNOSIS — E039 Hypothyroidism, unspecified: Secondary | ICD-10-CM | POA: Diagnosis present

## 2019-08-30 DIAGNOSIS — K219 Gastro-esophageal reflux disease without esophagitis: Secondary | ICD-10-CM | POA: Diagnosis present

## 2019-08-30 DIAGNOSIS — S82831A Other fracture of upper and lower end of right fibula, initial encounter for closed fracture: Secondary | ICD-10-CM | POA: Diagnosis present

## 2019-08-30 DIAGNOSIS — R4182 Altered mental status, unspecified: Secondary | ICD-10-CM | POA: Diagnosis not present

## 2019-08-30 DIAGNOSIS — S82891A Other fracture of right lower leg, initial encounter for closed fracture: Secondary | ICD-10-CM

## 2019-08-30 DIAGNOSIS — Z87891 Personal history of nicotine dependence: Secondary | ICD-10-CM | POA: Diagnosis not present

## 2019-08-30 DIAGNOSIS — Z9989 Dependence on other enabling machines and devices: Secondary | ICD-10-CM | POA: Diagnosis not present

## 2019-08-30 DIAGNOSIS — J690 Pneumonitis due to inhalation of food and vomit: Secondary | ICD-10-CM | POA: Diagnosis present

## 2019-08-30 DIAGNOSIS — G4733 Obstructive sleep apnea (adult) (pediatric): Secondary | ICD-10-CM | POA: Diagnosis present

## 2019-08-30 DIAGNOSIS — F329 Major depressive disorder, single episode, unspecified: Secondary | ICD-10-CM | POA: Diagnosis present

## 2019-08-30 DIAGNOSIS — J439 Emphysema, unspecified: Secondary | ICD-10-CM | POA: Diagnosis not present

## 2019-08-30 DIAGNOSIS — Z85828 Personal history of other malignant neoplasm of skin: Secondary | ICD-10-CM | POA: Diagnosis not present

## 2019-08-30 DIAGNOSIS — Z800 Family history of malignant neoplasm of digestive organs: Secondary | ICD-10-CM | POA: Diagnosis not present

## 2019-08-30 LAB — CBC WITH DIFFERENTIAL/PLATELET
Abs Immature Granulocytes: 0.02 10*3/uL (ref 0.00–0.07)
Basophils Absolute: 0 10*3/uL (ref 0.0–0.1)
Basophils Relative: 0 %
Eosinophils Absolute: 0 10*3/uL (ref 0.0–0.5)
Eosinophils Relative: 0 %
HCT: 39.6 % (ref 39.0–52.0)
Hemoglobin: 13.1 g/dL (ref 13.0–17.0)
Immature Granulocytes: 0 %
Lymphocytes Relative: 12 %
Lymphs Abs: 1.5 10*3/uL (ref 0.7–4.0)
MCH: 32.5 pg (ref 26.0–34.0)
MCHC: 33.1 g/dL (ref 30.0–36.0)
MCV: 98.3 fL (ref 80.0–100.0)
Monocytes Absolute: 2.2 10*3/uL — ABNORMAL HIGH (ref 0.1–1.0)
Monocytes Relative: 18 %
Neutro Abs: 8.3 10*3/uL — ABNORMAL HIGH (ref 1.7–7.7)
Neutrophils Relative %: 70 %
Platelets: 280 10*3/uL (ref 150–400)
RBC: 4.03 MIL/uL — ABNORMAL LOW (ref 4.22–5.81)
RDW: 12.7 % (ref 11.5–15.5)
WBC: 12 10*3/uL — ABNORMAL HIGH (ref 4.0–10.5)
nRBC: 0 % (ref 0.0–0.2)

## 2019-08-30 LAB — BRAIN NATRIURETIC PEPTIDE: B Natriuretic Peptide: 45 pg/mL (ref 0.0–100.0)

## 2019-08-30 LAB — TROPONIN I (HIGH SENSITIVITY)
Troponin I (High Sensitivity): <2 ng/L (ref <18)
Troponin I (High Sensitivity): <2 ng/L (ref <18)
Troponin I (High Sensitivity): 2 ng/L (ref <18)
Troponin I (High Sensitivity): <2 ng/L (ref <18)

## 2019-08-30 LAB — COMPREHENSIVE METABOLIC PANEL
ALT: 15 U/L (ref 0–44)
AST: 19 U/L (ref 15–41)
Albumin: 4.4 g/dL (ref 3.5–5.0)
Alkaline Phosphatase: 66 U/L (ref 38–126)
Anion gap: 5 (ref 5–15)
BUN: 14 mg/dL (ref 8–23)
CO2: 29 mmol/L (ref 22–32)
Calcium: 8.6 mg/dL — ABNORMAL LOW (ref 8.9–10.3)
Chloride: 98 mmol/L (ref 98–111)
Creatinine, Ser: 1.07 mg/dL (ref 0.61–1.24)
GFR calc Af Amer: >60 mL/min (ref >60)
GFR calc non Af Amer: >60 mL/min (ref >60)
Glucose, Bld: 157 mg/dL — ABNORMAL HIGH (ref 70–99)
Potassium: 4.3 mmol/L (ref 3.5–5.1)
Sodium: 132 mmol/L — ABNORMAL LOW (ref 135–145)
Total Bilirubin: 0.8 mg/dL (ref 0.3–1.2)
Total Protein: 7.1 g/dL (ref 6.5–8.1)

## 2019-08-30 LAB — RESPIRATORY PANEL BY RT PCR (FLU A&B, COVID)
Influenza A by PCR: NEGATIVE
Influenza B by PCR: NEGATIVE
SARS Coronavirus 2 by RT PCR: NEGATIVE

## 2019-08-30 LAB — D-DIMER, QUANTITATIVE: D-Dimer, Quant: 0.99 ug/mL-FEU — ABNORMAL HIGH (ref 0.00–0.50)

## 2019-08-30 MED ORDER — GABAPENTIN 300 MG PO CAPS
300.0000 mg | ORAL_CAPSULE | Freq: Three times a day (TID) | ORAL | Status: DC
  Administered 2019-08-30 – 2019-09-01 (×7): 300 mg via ORAL
  Filled 2019-08-30 (×7): qty 1

## 2019-08-30 MED ORDER — AMLODIPINE BESYLATE 5 MG PO TABS: 2.5000 mg | ORAL_TABLET | Freq: Every day | ORAL | Status: DC

## 2019-08-30 MED ORDER — ENOXAPARIN SODIUM 60 MG/0.6ML ~~LOC~~ SOLN
0.5000 mg/kg | SUBCUTANEOUS | Status: DC
  Administered 2019-08-31 – 2019-09-01 (×2): 50 mg via SUBCUTANEOUS
  Filled 2019-08-30 (×2): qty 0.6

## 2019-08-30 MED ORDER — LOSARTAN POTASSIUM 25 MG PO TABS: 100.0000 mg | ORAL_TABLET | Freq: Every day | ORAL | Status: DC

## 2019-08-30 MED ORDER — HYDROCODONE-ACETAMINOPHEN 5-325 MG PO TABS: 1.0000 | ORAL_TABLET | Freq: Four times a day (QID) | ORAL | 0 refills | Status: DC | PRN

## 2019-08-30 MED ORDER — IOHEXOL 350 MG/ML SOLN
75.0000 mL | Freq: Once | INTRAVENOUS | Status: AC | PRN
  Administered 2019-08-30: 04:00:00 75 mL via INTRAVENOUS

## 2019-08-30 MED ORDER — SODIUM CHLORIDE 0.9 % IV SOLN
3.0000 g | Freq: Three times a day (TID) | INTRAVENOUS | Status: DC
  Administered 2019-08-30 – 2019-09-01 (×7): 3 g via INTRAVENOUS
  Filled 2019-08-30: qty 8
  Filled 2019-08-30 (×2): qty 3
  Filled 2019-08-30: qty 8
  Filled 2019-08-30: qty 3
  Filled 2019-08-30: qty 8
  Filled 2019-08-30: qty 3
  Filled 2019-08-30 (×3): qty 8

## 2019-08-30 MED ORDER — IPRATROPIUM-ALBUTEROL 0.5-2.5 (3) MG/3ML IN SOLN
3.0000 mL | RESPIRATORY_TRACT | Status: DC
  Administered 2019-08-30 – 2019-08-31 (×3): 3 mL via RESPIRATORY_TRACT
  Filled 2019-08-30 (×3): qty 3

## 2019-08-30 MED ORDER — METHYLPREDNISOLONE SODIUM SUCC 125 MG IJ SOLR
125.0000 mg | Freq: Once | INTRAMUSCULAR | Status: AC
  Administered 2019-08-30: 03:00:00 125 mg via INTRAVENOUS
  Filled 2019-08-30: qty 2

## 2019-08-30 MED ORDER — HYDROCODONE-ACETAMINOPHEN 5-325 MG PO TABS
1.0000 | ORAL_TABLET | Freq: Four times a day (QID) | ORAL | Status: DC | PRN
  Administered 2019-08-31: 11:00:00 1 via ORAL
  Filled 2019-08-30 (×2): qty 1

## 2019-08-30 MED ORDER — AMOXICILLIN 500 MG PO CAPS: 500.0000 mg | ORAL_CAPSULE | Freq: Three times a day (TID) | ORAL | 0 refills | Status: DC

## 2019-08-30 MED ORDER — SODIUM CHLORIDE 0.9 % IV SOLN: INTRAVENOUS | Status: DC

## 2019-08-30 MED ORDER — ALBUTEROL (5 MG/ML) CONTINUOUS INHALATION SOLN
INHALATION_SOLUTION | RESPIRATORY_TRACT | Status: AC
  Filled 2019-08-30: qty 20

## 2019-08-30 MED ORDER — ALBUTEROL (5 MG/ML) CONTINUOUS INHALATION SOLN
10.0000 mg/h | INHALATION_SOLUTION | Freq: Once | RESPIRATORY_TRACT | Status: AC
  Administered 2019-08-30: 03:00:00 10 mg/h via RESPIRATORY_TRACT

## 2019-08-30 MED ORDER — MONTELUKAST SODIUM 10 MG PO TABS
10.0000 mg | ORAL_TABLET | Freq: Every day | ORAL | Status: DC
  Administered 2019-08-30 – 2019-08-31 (×2): 10 mg via ORAL
  Filled 2019-08-30 (×2): qty 1

## 2019-08-30 MED ORDER — ASPIRIN EC 81 MG PO TBEC
81.0000 mg | DELAYED_RELEASE_TABLET | Freq: Every day | ORAL | Status: DC
  Administered 2019-08-31 – 2019-09-01 (×2): 81 mg via ORAL
  Filled 2019-08-30 (×2): qty 1

## 2019-08-30 MED ORDER — IPRATROPIUM BROMIDE 0.02 % IN SOLN
0.5000 mg | Freq: Once | RESPIRATORY_TRACT | Status: AC
  Administered 2019-08-30: 03:00:00 0.5 mg via RESPIRATORY_TRACT

## 2019-08-30 MED ORDER — IPRATROPIUM BROMIDE 0.02 % IN SOLN
RESPIRATORY_TRACT | Status: AC
  Filled 2019-08-30: qty 2.5

## 2019-08-30 MED ORDER — GUAIFENESIN ER 600 MG PO TB12
1200.0000 mg | ORAL_TABLET | Freq: Two times a day (BID) | ORAL | Status: DC
  Administered 2019-08-30 – 2019-09-01 (×4): 1200 mg via ORAL
  Filled 2019-08-30 (×4): qty 2

## 2019-08-30 MED ORDER — SODIUM CHLORIDE 0.9 % IV SOLN: 3.0000 g | Freq: Once | INTRAVENOUS | Status: DC

## 2019-08-30 MED ORDER — SODIUM CHLORIDE 0.9 % IV BOLUS
500.0000 mL | Freq: Once | INTRAVENOUS | Status: AC
  Administered 2019-08-30: 03:00:00 500 mL via INTRAVENOUS

## 2019-08-30 MED ORDER — ALBUTEROL SULFATE (2.5 MG/3ML) 0.083% IN NEBU: 2.5000 mg | INHALATION_SOLUTION | Freq: Four times a day (QID) | RESPIRATORY_TRACT | Status: DC | PRN

## 2019-08-30 MED ORDER — PANTOPRAZOLE SODIUM 40 MG PO TBEC
40.0000 mg | DELAYED_RELEASE_TABLET | Freq: Every day | ORAL | Status: DC
  Administered 2019-08-31 – 2019-09-01 (×2): 40 mg via ORAL
  Filled 2019-08-30 (×2): qty 1

## 2019-08-30 MED ORDER — CITALOPRAM HYDROBROMIDE 20 MG PO TABS
20.0000 mg | ORAL_TABLET | Freq: Every day | ORAL | Status: DC
  Administered 2019-08-30 – 2019-08-31 (×2): 20 mg via ORAL
  Filled 2019-08-30 (×2): qty 1

## 2019-08-30 MED ORDER — LEVOTHYROXINE SODIUM 75 MCG PO TABS
75.0000 ug | ORAL_TABLET | Freq: Every day | ORAL | Status: DC
  Administered 2019-08-30 – 2019-09-01 (×3): 75 ug via ORAL
  Filled 2019-08-30 (×2): qty 1
  Filled 2019-08-30: qty 2

## 2019-08-30 NOTE — Progress Notes (Signed)
RN has been reassigned to another pod. Reports given to [facility], Therapist, sports.Pt receiving IVABX now and shows no visible signs of distress.

## 2019-08-30 NOTE — Progress Notes (Signed)
Pt placed on full face CPAP.  Tolerating well at this time.  RT will continue to monitor.

## 2019-08-30 NOTE — Progress Notes (Signed)
Still requiring a great deal of oxygen. Patient does have Sleep apnea. Wears CPAP of 4 , this is off of record from Dr Gwenette Greet. Not sure if he has recovered from cardiac event.

## 2019-08-30 NOTE — ED Notes (Signed)
While this RN was discharging pt, pt ambulated w/out assistance to wheelchair, pt coughed & then slumped over, pt became pale, no respirations noted, no palpable pulse, this RN called for help, 4 staff assist to get pt back in bed, MD at bedside, pt began to moan & color came back, pt placed back on cardiac monitor & NRB. Pt initial O2 sat 79%, after placed on NRB pts O2 95%. Respiratory @ bedside for breathing tx.

## 2019-08-30 NOTE — Progress Notes (Signed)
Patient has some upper airway wheezes, lungs appear decreased. BP is increased 140's/111 since passing out f 28 to 30 . Saturation is 100 percent on NRB . Will titrate down.

## 2019-08-30 NOTE — Evaluation (Signed)
Clinical/Bedside Swallow Evaluation Patient Details  Name: Cristian Yang MRN: [id number] Date of Birth: 1935-01-24  Today's Date: 08/30/2019 Time: SLP Start Time (ACUTE ONLY): 1332 SLP Stop Time (ACUTE ONLY): 1356 SLP Time Calculation (min) (ACUTE ONLY): 24 min  Past Medical History:  Past Medical History:  Diagnosis Date  . Arthritis   . Asthma   . Chronic back pain   . COPD (chronic obstructive pulmonary disease) ([place])   . Depression   . GERD (gastroesophageal reflux disease)   . Hyperlipidemia   . Hypertension   . Hypothyroidism   . OSA on CPAP   . Skin cancer   . Sleep apnea    uses CPAP nightly   Past Surgical History:  Past Surgical History:  Procedure Laterality Date  . CATARACT EXTRACTION W/PHACO Left 02/13/2016   Procedure: CATARACT EXTRACTION PHACO AND INTRAOCULAR LENS PLACEMENT (IOC);  Surgeon: Rutherford Guys, MD;  Location: [facility];  Service: Ophthalmology;  Laterality: Left;  CDE: 9.23  . CATARACT EXTRACTION W/PHACO Right 05/28/2019   Procedure: CATARACT EXTRACTION PHACO AND INTRAOCULAR LENS PLACEMENT (IOC);  Surgeon: Baruch Goldmann, MD;  Location: [facility];  Service: Ophthalmology;  Laterality: Right;  CDE: 9.51  . NECK SURGERY     cervical disc  . PROSTATE SURGERY     TURP, laser  . SKIN CANCER EXCISION  2013  . skin cancer removed     x3   HPI:  Cristian Yang  is a 85 y.o. male, with history of COPD, OSA on CPAP, depression, hypertension, hyperlipidemia, hypothyroidism who initially came to hospital after patient slipped in the snow and sustained injury to right ankle.  As per patient he was walking outside in the flip-flops walking in the snow taking his trash can out when the flip-flop broke and he slipped in the snow injuring his right ankle.  EMS reported patient's initial blood pressure was 85/50, pulse ox was 88% on room air.  Patient is not on oxygen at home. X-ray of ankle showed obliquely oriented distal fibular fracture and lateral talar shift and angulation.  In the ED patient had an episode of syncope, after he had a bout of coughing.  His pulse ox dropped to 70s, improved after putting on oxygen via nasal cannula, blood pressure was 73/59, improved to 93% off putting on oxygen via nasal cannula, he received 500 cc bolus of fluid.  CTA chest was done which was negative for PE but showed bilateral pneumonia with confluent bilateral lower lobe peribronchial opacity with also involvement of posterior left upper lobe,?  Aspiration. BSE ordered. He had BSE 11/07/17 at [facility] with recommendation for D3/thin and suspected esophageal component.    Assessment / Plan / Recommendation Clinical Impression  Clinical swallow evaluation completed at bedside. Pt with congested cough prior to po administration and he has been expectorating thick, stringy secretions. Pt able to reposition himself upright in the bed. He wears U/L dentures and typically cuts his meats thoroughly to accommodate for dentures. Pt assessed with ice chips, thin water via cup/straw, puree, and regular textures. Pt without overt signs of aspiration, however he did have occasional coughing throughout the visit as stated above (difficult to ascertain due to h/o COPD and already with PNA). Recommend regular textures and thin liquids and Pt to cut up his meat thoroughly and implement aspiration and reflux precautions (avoid talking during po intake, sit upright for all po, eat slowly, swallow 2x for each sip, and remain upright during and after meals). SLP will follow  up tomorrow and may consider MBSS if needed.    SLP Visit Diagnosis: Dysphagia, unspecified (R13.10)    Aspiration Risk  Mild aspiration risk    Diet Recommendation Regular;Thin liquid   Liquid Administration via: Cup;Straw Medication Administration: Whole meds with liquid Supervision: Patient able to self feed;Intermittent supervision to cue for compensatory strategies Compensations: Slow rate;Small sips/bites Postural Changes:  Seated upright at 90 degrees;Remain upright for at least 30 minutes after po intake    Other  Recommendations Oral Care Recommendations: Oral care BID Other Recommendations: Clarify dietary restrictions   Follow up Recommendations (pending)      Frequency and Duration min 2x/week  1 week       Prognosis Prognosis for Safe Diet Advancement: Good      Swallow Study   General Date of Onset: 08/29/19 HPI: Cristian Yang  is a 85 y.o. male, with history of COPD, OSA on CPAP, depression, hypertension, hyperlipidemia, hypothyroidism who initially came to hospital after patient slipped in the snow and sustained injury to right ankle.  As per patient he was walking outside in the flip-flops walking in the snow taking his trash can out when the flip-flop broke and he slipped in the snow injuring his right ankle.  EMS reported patient's initial blood pressure was 85/50, pulse ox was 88% on room air.  Patient is not on oxygen at home. X-ray of ankle showed obliquely oriented distal fibular fracture and lateral talar shift and angulation. In the ED patient had an episode of syncope, after he had a bout of coughing.  His pulse ox dropped to 70s, improved after putting on oxygen via nasal cannula, blood pressure was 73/59, improved to 93% off putting on oxygen via nasal cannula, he received 500 cc bolus of fluid.  CTA chest was done which was negative for PE but showed bilateral pneumonia with confluent bilateral lower lobe peribronchial opacity with also involvement of posterior left upper lobe,?  Aspiration. BSE ordered. He had BSE 11/07/17 at [facility] with recommendation for D3/thin and suspected esophageal component.  Type of Study: Bedside Swallow Evaluation Previous Swallow Assessment: BSE 10/2017 D3/thin Diet Prior to this Study: NPO Temperature Spikes Noted: No Respiratory Status: Nasal cannula History of Recent Intubation: No Behavior/Cognition: Alert;Cooperative;Pleasant mood Oral Cavity  Assessment: Within Functional Limits Oral Care Completed by SLP: Recent completion by staff Oral Cavity - Dentition: Dentures, top;Dentures, bottom Vision: Functional for self-feeding Self-Feeding Abilities: Able to feed self Patient Positioning: Upright in bed Baseline Vocal Quality: Normal Volitional Cough: Congested;Strong Volitional Swallow: Able to elicit    Oral/Motor/Sensory Function Overall Oral Motor/Sensory Function: Within functional limits   Ice Chips Ice chips: Within functional limits Presentation: Spoon   Thin Liquid Thin Liquid: Within functional limits Presentation: Cup;Self Fed;Straw Other Comments: (Pt with occasional cough before, during, and after trials)    Nectar Thick Nectar Thick Liquid: Not tested   Honey Thick Honey Thick Liquid: Not tested   Puree Puree: Within functional limits Presentation: Self Fed;Spoon   Solid     Solid: Within functional limits Presentation: Self Fed     Thank you,  Genene Churn, Erma  [place] 08/30/2019,2:23 PM

## 2019-08-30 NOTE — Progress Notes (Signed)
Pharmacy Antibiotic Note  Cristian Yang is a 85 y.o. male admitted on 08/29/2019 with aspiration pneumonia.  Pharmacy has been consulted for Unasyn dosing.  Plan: Unasyn 3 gram IV every 8 hours Monitor clinical progress, cultures/sensitivities, renal function, abx plan  Height: 5\' 8"  (172.7 cm) Weight: 222 lb (100.7 kg) IBW/kg (Calculated) : 68.4  Temp (24hrs), Avg:98.4 F (36.9 C), Min:97.8 F (36.6 C), Max:98.9 F (37.2 C)  Recent Labs  Lab 08/30/19 0233  WBC 12.0*  CREATININE 1.07    Estimated Creatinine Clearance: 60.2 mL/min (by C-G formula based on SCr of 1.07 mg/dL).    Allergies  Allergen Reactions  . Ciprofloxacin Nausea And Vomiting  . Codeine Nausea And Vomiting    Antimicrobials this admission: 2/8 Unasyn >>   Dose adjustments this admission:  Microbiology results: 2/8 RP Flu/COVID: negative    Thank you for allowing pharmacy to be a part of this patient's care.  Jens Som, PharmD 08/30/2019 6:01 AM

## 2019-08-30 NOTE — Progress Notes (Signed)
Pt is sleeping in bed and shows no visible signs of distress. He remains NPO for bedside swallow test. IVF are running. Call bell within reach.

## 2019-08-30 NOTE — H&P (Addendum)
TRH H&P    Patient Demographics:    Cristian Yang, is a 85 y.o. male  MRN: [id number]  DOB - May 23, 1935  Admit Date - 08/29/2019  Referring MD/NP/PA: Rolland Porter  Outpatient Primary MD for the patient is Celene Squibb, MD  Patient coming from: Home  Chief complaint-fall   HPI:    Cristian Yang  is a 85 y.o. male, with history of COPD, OSA on CPAP, depression, hypertension, hyperlipidemia, hypothyroidism who initially came to hospital after patient slipped in the snow and sustained injury to right ankle.  As per patient he was walking outside in the flip-flops walking in the snow taking his trash can out when the flip-flop broke and he slipped in the snow injuring his right ankle.  EMS reported patient's initial blood pressure was 85/50, pulse ox was 88% on room air.  Patient is not on oxygen at home.  X-ray of ankle showed obliquely oriented distal fibular fracture and lateral talar shift and angulation.  In the ED patient had an episode of syncope, after he had a bout of coughing.  His pulse ox dropped to 70s, improved after putting on oxygen via nasal cannula, blood pressure was 73/59, improved to 93% off putting on oxygen via nasal cannula, he received 500 cc bolus of fluid.  CTA chest was done which was negative for PE but showed bilateral pneumonia with confluent bilateral lower lobe peribronchial opacity with also involvement of posterior left upper lobe,?  Aspiration.   Now patient is requiring 9 L/min of oxygen via nasal cannula.  He denies nausea vomiting or diarrhea Denies chest pain Denies fever or chills Denies dysuria    Review of systems:    In addition to the HPI above,    All other systems reviewed and are negative.    Past History of the following :    Past Medical History:  Diagnosis Date  . Arthritis   . Asthma   . Chronic back pain   . COPD (chronic obstructive pulmonary  disease) ([place])   . Depression   . GERD (gastroesophageal reflux disease)   . Hyperlipidemia   . Hypertension   . Hypothyroidism   . OSA on CPAP   . Skin cancer   . Sleep apnea    uses CPAP nightly      Past Surgical History:  Procedure Laterality Date  . CATARACT EXTRACTION W/PHACO Left 02/13/2016   Procedure: CATARACT EXTRACTION PHACO AND INTRAOCULAR LENS PLACEMENT (IOC);  Surgeon: Rutherford Guys, MD;  Location: [facility];  Service: Ophthalmology;  Laterality: Left;  CDE: 9.23  . CATARACT EXTRACTION W/PHACO Right 05/28/2019   Procedure: CATARACT EXTRACTION PHACO AND INTRAOCULAR LENS PLACEMENT (IOC);  Surgeon: Baruch Goldmann, MD;  Location: [facility];  Service: Ophthalmology;  Laterality: Right;  CDE: 9.51  . NECK SURGERY     cervical disc  . PROSTATE SURGERY     TURP, laser  . SKIN CANCER EXCISION  2013  . skin cancer removed     x3      Social History:  Social History   Tobacco Use  . Smoking status: Former Smoker    Packs/day: 2.00    Years: 30.00    Pack years: 60.00    Types: Cigarettes    Quit date: 07/23/2003    Years since quitting: 16.1  . Smokeless tobacco: Never Used  Substance Use Topics  . Alcohol use: No    Alcohol/week: 0.0 standard drinks    Comment: quit 2005       Family History :     Family History  Problem Relation Age of Onset  . Diabetes Mother   . Heart disease Mother   . Liver cancer Brother   . Bone cancer Sister   . Asthma Father       Home Medications:   Prior to Admission medications   Medication Sig Start Date End Date Taking? Authorizing Provider  albuterol (PROVENTIL) (2.5 MG/3ML) 0.083% nebulizer solution USE 1 VIAL IN NEBULIZER ONCE DAILY AS DIRECTED. Patient taking differently: Take 2.5 mg by nebulization daily as needed for wheezing or shortness of breath.  05/10/19   Chesley Mires, MD  amLODipine (NORVASC) 2.5 MG tablet Take 2.5 mg by mouth daily.    [provider]  amoxicillin (AMOXIL) 500 MG capsule Take 1  capsule (500 mg total) by mouth 3 (three) times daily. 08/30/19   Rolland Porter, MD  calcium elemental as carbonate (TUMS ULTRA 1000) 400 MG chewable tablet Chew 3,000 mg by mouth at bedtime.     [provider]  citalopram (CELEXA) 20 MG tablet Take 1 tablet (20 mg total) by mouth at bedtime. 10/15/18   Bufford Lope, DO  docusate sodium (COLACE) 100 MG capsule TAKE 1 CAPSULE BY MOUTH IN THE MORNING Patient taking differently: Take 100 mg by mouth daily.  01/08/19   [place] Bing, DO  gabapentin (NEURONTIN) 300 MG capsule TAKE 1 CAPSULE BY MOUTH IN THE MORNING TAKE 2 CAPSULE IN THE EVENING Patient taking differently: Take 300 mg by mouth 3 (three) times daily. TAKE 1 CAPSULE BY MOUTH IN THE MORNING TAKE 2 CAPSULE IN THE EVENING 05/01/18   [place] Bing, DO  GNP ASPIRIN LOW DOSE 81 MG EC tablet TAKE 1 TABLET BY MOUTH ONCE DAILY. Patient taking differently: Take 81 mg by mouth daily.  06/30/18   [place] Bing, DO  HYDROcodone-acetaminophen (NORCO/VICODIN) 5-325 MG tablet Take 1 tablet by mouth every 6 (six) hours as needed for moderate pain or severe pain. 08/30/19   Rolland Porter, MD  levothyroxine (SYNTHROID, LEVOTHROID) 75 MCG tablet Take 1 tablet (75 mcg total) by mouth daily before breakfast. 10/15/18   Bufford Lope, DO  lidocaine (XYLOCAINE) 5 % ointment Apply 1 application topically 3 (three) times daily as needed. Apply to affected area Patient not taking: Reported on 05/19/2019 11/21/17   [place] Alpha, DO  losartan (COZAAR) 100 MG tablet Take 1 tablet (100 mg total) by mouth at bedtime. 10/15/18   Bufford Lope, DO  mometasone (NASONEX) 50 MCG/ACT nasal spray SPRAY 2 SPRAYS INTO EACH NOSTRIL ONCE DAILY Patient taking differently: Place 2 sprays into the nose daily.  05/05/19   Chesley Mires, MD  montelukast (SINGULAIR) 10 MG tablet Take 1 tablet (10 mg total) by mouth at bedtime. 10/15/18   Bufford Lope, DO  multivitamin (ONE-A-DAY MEN'S) TABS tablet Take 1 tablet by mouth every  morning. 10/15/18   Bufford Lope, DO  omeprazole (PRILOSEC) 20 MG capsule Take 1 capsule (20 mg total) by mouth every morning. 10/15/18  Bufford Lope, DO  PRESCRIPTION MEDICATION Patient uses CPAP at night    [provider]  TRELEGY ELLIPTA 100-62.5-25 MCG/INH AEPB INHALE 1 PUFF INTO THE LUNGS ONCE DAILY. Patient taking differently: Inhale 1 puff into the lungs daily.  05/05/19   Chesley Mires, MD     Allergies:     Allergies  Allergen Reactions  . Ciprofloxacin Nausea And Vomiting  . Codeine Nausea And Vomiting     Physical Exam:   Vitals  Blood pressure 108/61, pulse 87, temperature 98.9 F (37.2 C), temperature source Oral, resp. rate (!) 24, height 5\' 8"  (1.727 m), weight 100.7 kg, SpO2 94 %.  1.  General: Appears in no acute distress  2. Psychiatric: Alert, oriented x3, intact insight and judgment  3. Neurologic: Cranial nerves II through XII grossly intact, no focal deficit.  4. HEENMT:  Atraumatic normocephalic, extraocular muscle intact  5. Respiratory : Decreased breath sounds bilaterally  6. Cardiovascular : S1-S2, regular, no murmur auscultated  7. Gastrointestinal:  Abdominal soft, nontender, no organomegaly  8. Skin:  No rashes noted  9.Musculoskeletal:  Left foot in Cam boot    Data Review:    CBC Recent Labs  Lab 08/30/19 0233  WBC 12.0*  HGB 13.1  HCT 39.6  PLT 280  MCV 98.3  MCH 32.5  MCHC 33.1  RDW 12.7  LYMPHSABS 1.5  MONOABS 2.2*  EOSABS 0.0  BASOSABS 0.0   ------------------------------------------------------------------------------------------------------------------  Results for orders placed or performed during the hospital encounter of 08/29/19 (from the past 48 hour(s))  Comprehensive metabolic panel     Status: Abnormal   Collection Time: 08/30/19  2:33 AM  Result Value Ref Range   Sodium 132 (L) 135 - 145 mmol/L   Potassium 4.3 3.5 - 5.1 mmol/L   Chloride 98 98 - 111 mmol/L   CO2 29 22 - 32 mmol/L    Glucose, Bld 157 (H) 70 - 99 mg/dL   BUN 14 8 - 23 mg/dL   Creatinine, Ser 1.07 0.61 - 1.24 mg/dL   Calcium 8.6 (L) 8.9 - 10.3 mg/dL   Total Protein 7.1 6.5 - 8.1 g/dL   Albumin 4.4 3.5 - 5.0 g/dL   AST 19 15 - 41 U/L   ALT 15 0 - 44 U/L   Alkaline Phosphatase 66 38 - 126 U/L   Total Bilirubin 0.8 0.3 - 1.2 mg/dL   GFR calc non Af Amer >60 >60 mL/min   GFR calc Af Amer >60 >60 mL/min   Anion gap 5 5 - 15    Comment: Performed at [facility], [address], [place], [place] [address]  CBC with Differential     Status: Abnormal   Collection Time: 08/30/19  2:33 AM  Result Value Ref Range   WBC 12.0 (H) 4.0 - 10.5 K/uL   RBC 4.03 (L) 4.22 - 5.81 MIL/uL   Hemoglobin 13.1 13.0 - 17.0 g/dL   HCT 39.6 39.0 - 52.0 %   MCV 98.3 80.0 - 100.0 fL   MCH 32.5 26.0 - 34.0 pg   MCHC 33.1 30.0 - 36.0 g/dL   RDW 12.7 11.5 - 15.5 %   Platelets 280 150 - 400 K/uL   nRBC 0.0 0.0 - 0.2 %   Neutrophils Relative % 70 %   Neutro Abs 8.3 (H) 1.7 - 7.7 K/uL   Lymphocytes Relative 12 %   Lymphs Abs 1.5 0.7 - 4.0 K/uL   Monocytes Relative 18 %   Monocytes Absolute 2.2 (  H) 0.1 - 1.0 K/uL   Eosinophils Relative 0 %   Eosinophils Absolute 0.0 0.0 - 0.5 K/uL   Basophils Relative 0 %   Basophils Absolute 0.0 0.0 - 0.1 K/uL   Immature Granulocytes 0 %   Abs Immature Granulocytes 0.02 0.00 - 0.07 K/uL    Comment: Performed at [facility], [address], [place], [place] [address]  Brain natriuretic peptide     Status: None   Collection Time: 08/30/19  2:33 AM  Result Value Ref Range   B Natriuretic Peptide 45.0 0.0 - 100.0 pg/mL    Comment: Performed at [facility], [address], [place], [place] [address]  Troponin I (High Sensitivity)     Status: None   Collection Time: 08/30/19  2:33 AM  Result Value Ref Range   Troponin I (High Sensitivity) <2.0 <18 ng/L    Comment: Performed at [facility], [address], [place], [place] [address]  D-dimer, quantitative     Status: Abnormal   Collection  Time: 08/30/19  2:33 AM  Result Value Ref Range   D-Dimer, Quant 0.99 (H) 0.00 - 0.50 ug/mL-FEU    Comment: (NOTE) At the manufacturer cut-off of 0.50 ug/mL FEU, this assay has been documented to exclude PE with a sensitivity and negative predictive value of 97 to 99%.  At this time, this assay has not been approved by the FDA to exclude DVT/VTE. Results should be correlated with clinical presentation. Performed at [facility], [address] [place], [place] [address]   Respiratory Panel by RT PCR (Flu A&B, Covid) - Nasopharyngeal Swab     Status: None   Collection Time: 08/30/19  3:18 AM   Specimen: Nasopharyngeal Swab  Result Value Ref Range   SARS Coronavirus 2 by RT PCR NEGATIVE NEGATIVE    Comment: (NOTE) SARS-CoV-2 target nucleic acids are NOT DETECTED. The SARS-CoV-2 RNA is generally detectable in upper respiratoy specimens during the acute phase of infection. The lowest concentration of SARS-CoV-2 viral copies this assay can detect is 131 copies/mL. A negative result does not preclude SARS-Cov-2 infection and should not be used as the sole basis for treatment or other patient management decisions. A negative result may occur with  improper specimen collection/handling, submission of specimen other than nasopharyngeal swab, presence of viral mutation(s) within the areas targeted by this assay, and inadequate number of viral copies (<131 copies/mL). A negative result must be combined with clinical observations, patient history, and epidemiological information. The expected result is Negative. Fact Sheet for Patients:  PinkCheek.be Fact Sheet for Healthcare Providers:  GravelBags.it This test is not yet ap proved or cleared by the [place] FDA and  has been authorized for detection and/or diagnosis of SARS-CoV-2 by FDA under an Emergency Use Authorization (EUA). This EUA will remain  in effect (meaning this test  can be used) for the duration of the COVID-19 declaration under Section 564(b)(1) of the Act, 21 U.S.C. section 360bbb-3(b)(1), unless the authorization is terminated or revoked sooner.    Influenza A by PCR NEGATIVE NEGATIVE   Influenza B by PCR NEGATIVE NEGATIVE    Comment: (NOTE) The Xpert Xpress SARS-CoV-2/FLU/RSV assay is intended as an aid in  the diagnosis of influenza from Nasopharyngeal swab specimens and  should not be used as a sole basis for treatment. Nasal washings and  aspirates are unacceptable for Xpert Xpress SARS-CoV-2/FLU/RSV  testing. Fact Sheet for Patients: PinkCheek.be Fact Sheet for Healthcare Providers: GravelBags.it This test is not yet approved or cleared  by the [place] and  has been authorized for detection and/or diagnosis of SARS-CoV-2 by  FDA under an Emergency Use Authorization (EUA). This EUA will remain  in effect (meaning this test can be used) for the duration of the  Covid-19 declaration under Section 564(b)(1) of the Act, 21  U.S.C. section 360bbb-3(b)(1), unless the authorization is  terminated or revoked. Performed at [facility], [address] [place], [place] [address]   Troponin I (High Sensitivity)     Status: None   Collection Time: 08/30/19  4:39 AM  Result Value Ref Range   Troponin I (High Sensitivity) <2.0 <18 ng/L    Comment: Performed at [facility], [address], [place], [place] [address]    Chemistries  Recent Labs  Lab 08/30/19 0233  NA 132*  K 4.3  CL 98  CO2 29  GLUCOSE 157*  BUN 14  CREATININE 1.07  CALCIUM 8.6*  AST 19  ALT 15  ALKPHOS 66  BILITOT 0.8   ------------------------------------------------------------------------------------------------------------------  ------------------------------------------------------------------------------------------------------------------ GFR: Estimated Creatinine Clearance: 60.2 mL/min (by  C-G formula based on SCr of 1.07 mg/dL). Liver Function Tests: Recent Labs  Lab 08/30/19 0233  AST 19  ALT 15  ALKPHOS 66  BILITOT 0.8  PROT 7.1  ALBUMIN 4.4    --------------------------------------------------------------------------------------------------------------- Urine analysis:    Component Value Date/Time   COLORURINE YELLOW 07/09/2018 2114   APPEARANCEUR CLEAR 07/09/2018 2114   LABSPEC 1.018 07/09/2018 2114   PHURINE 6.0 07/09/2018 2114   GLUCOSEU NEGATIVE 07/09/2018 2114   [place] NEGATIVE 07/09/2018 2114   [place] NEGATIVE 07/09/2018 2114   [place] NEGATIVE 07/09/2018 2114   PROTEINUR 30 (A) 07/09/2018 2114   NITRITE NEGATIVE 07/09/2018 2114   LEUKOCYTESUR NEGATIVE 07/09/2018 2114      Imaging Results:    DG Ankle Complete Right  Result Date: 08/30/2019 CLINICAL DATA:  [place] in [place] EXAM: RIGHT ANKLE - COMPLETE 3+ VIEW COMPARISON:  None. FINDINGS: Obliquely oriented distal fibular fracture with trans syndesmotic extension. No visualized medial or posterior malleolar fractures are identified though suspect syndesmotic disruption given the presence of lateral talar shift and angulation. Circumferential soft tissue swelling and large ankle joint effusion is present. No other acute fracture is seen. Background of mild degenerative spurring of the ankle, midfoot and hindfoot. IMPRESSION: Obliquely oriented distal fibular fracture and lateral talar shift with angulation. Pattern is suggestive of a Weber B stage III/IV supination external rotation injury, raising concern for possible disruption of the posterior syndesmosis and medial collateral bands. Associated swelling and ankle effusion. Electronically Signed   By: Lovena Le M.D.   On: 08/30/2019 00:19   CT Head Wo Contrast  Result Date: 08/30/2019 CLINICAL DATA:  85 year old male with abrupt altered mental status, coughed and then became pale and unresponsive. EXAM: CT HEAD WITHOUT CONTRAST TECHNIQUE: Contiguous  axial images were obtained from the base of the skull through the vertex without intravenous contrast. COMPARISON:  Head CT 11/05/2017. FINDINGS: Brain: Stable cerebral volume. No midline shift, mass effect, or evidence of intracranial mass lesion. No ventriculomegaly. Chronic confluent bilateral cerebral white matter hypodensity. Chronic deep white matter capsule involvement. Relative sparing of the basal ganglia. Stable gray-white matter differentiation throughout the brain. No acute intracranial hemorrhage identified. No cortically based acute infarct identified. Vascular: Calcified atherosclerosis at the skull base. No suspicious intracranial vascular hyperdensity. Skull: No acute osseous abnormality identified. Sinuses/Orbits: Scattered bilateral ethmoid sinus mucosal thickening appears chronic and mildly improved. Other Visualized paranasal sinuses and mastoids are stable and  well pneumatized. Other: Interval postoperative changes to the right globe. No acute orbit or scalp soft tissue finding. IMPRESSION: No acute intracranial abnormality. Stable non contrast CT appearance of the brain with chronic small vessel disease. Electronically Signed   By: Genevie [name] M.D.   On: 08/30/2019 04:59   CT Angio Chest PE W/Cm &/Or Wo Cm  Result Date: 08/30/2019 CLINICAL DATA:  85 year old male with abrupt altered mental status, coughed and then became pale and unresponsive. EXAM: CT ANGIOGRAPHY CHEST WITH CONTRAST TECHNIQUE: Multidetector CT imaging of the chest was performed using the standard protocol during bolus administration of intravenous contrast. Multiplanar CT image reconstructions and MIPs were obtained to evaluate the vascular anatomy. CONTRAST:  16mL OMNIPAQUE IOHEXOL 350 MG/ML SOLN COMPARISON:  Portable chest earlier tonight. CT Abdomen and Pelvis 11/05/2017. FINDINGS: Cardiovascular: Adequate contrast bolus timing in the pulmonary arterial tree. However there is significant respiratory motion. There is no  central or hilar pulmonary artery filling defect identified. Upper lobe branch detail is superior to lower lobe detail. No convincing upper lobe pulmonary artery filling defect. Lower lobe branches are significantly obscured and not well evaluated. Cardiomegaly. No pericardial effusion. Negative visible aorta aside from mild atherosclerosis. No definite calcified coronary artery atherosclerosis. Mediastinum/Nodes: Chronic hiatal hernia contained mostly fat in 2019, now contains a small portion of the stomach also. No mediastinal or hilar lymphadenopathy. Lungs/Pleura: Respiratory motion which also involves the major airways. Grossly patent major airways. The right upper lobe is clear. There is patchy and tree-in-bud nodularity in the posterior left upper lobe on series 6, image 54. There is confluent bilateral lower lobe peribronchial opacity with areas of early consolidation, primarily involving medial and posterior basal segments and greater on the left. Less pronounced involvement of the costophrenic angles. Minimal middle lobe peribronchial involvement. No pleural effusion. Upper Abdomen: Negative visible liver, gallbladder, spleen, adrenal glands and kidneys. Partially visible pancreatic atrophy. Negative visible bowel aside from the small gastric hiatal hernia. Musculoskeletal: Osteopenia. Partially visible lower cervical ACDF. Thoracic spine degeneration. No acute or suspicious osseous lesion. Review of the MIP images confirms the above findings. IMPRESSION: 1. Limited evaluation for pulmonary embolus due to respiratory motion. No central or hilar pulmonary embolus identified. 2. Bilateral Pneumonia with confluent bilateral lower lobe peribronchial opacity, involvement of the posterior left upper lobe. Aspiration should be considered in this setting. No pleural effusion. 3. Cardiomegaly. Aortic Atherosclerosis (ICD10-I70.0). 4. Small to moderate hiatal hernia. Electronically Signed   By: Genevie [name] M.D.   On:  08/30/2019 04:57   DG Chest Port 1 View  Result Date: 08/30/2019 CLINICAL DATA:  Cough and hypoxia. EXAM: PORTABLE CHEST 1 VIEW COMPARISON:  07/09/2018 FINDINGS: Artifact overlies the chest. Chronically prominent interstitial lung markings. No sign of active consolidation, collapse or effusion. No acute bone finding. IMPRESSION: No active disease detected.  Chronic interstitial lung markings. Electronically Signed   By: Nelson Chimes M.D.   On: 08/30/2019 01:13    My personal review of EKG: Rhythm NSR, no ST-T changes   Assessment & Plan:    Active Problems:   Aspiration pneumonia ([place])   1. Acute hypoxic respiratory failure-patient initially presented with fall, developed hypoxia in the ED, CT chest shows bilateral pneumonia.  Concern for aspiration.  Will start patient on Unasyn per pharmacy consultation for aspiration pneumonia.  Follow blood culture results.  We will keep him n.p.o. and obtain swallow evaluation in a.m. 2. Syncope-patient had likely vasovagal syncopal attack in the ED after he had a bout  of coughing.  Will closely monitor on telemetry.  Obtain serial troponin. 3. Left distal fibula fracture-we will obtain orthopedics consult for further recommendations. 4. Hypothyroidism-continue Synthroid 5. COPD/OSA-continue CPAP nightly.  Continue albuterol 2.5 mg nebulizer every 6 hours as needed. 6. Hypertension-patient had episode of hypotension in the ED, will hold antihypertensive medications for now.  If patient blood pressure continues to remain stable, consider restarting amlodipine and losartan.    DVT Prophylaxis-   Lovenox   AM Labs Ordered, also please review Full Orders  Family Communication: Admission, patients condition and plan of care including tests being ordered have been discussed with the patient who indicate understanding and agree with the plan and Code Status.  Code Status: Full code  Admission status: Inpatient: Based on patients clinical presentation and  evaluation of above clinical data, I have made determination that patient meets Inpatient criteria at this time.  Time spent in minutes : 60 minutes   Leetta Hendriks S Fabrice Dyal M.D

## 2019-08-30 NOTE — Progress Notes (Signed)
Patient admitted to the hospital earlier this morning by Dr. Darrick Meigs  Patient seen and examined.  Denies any shortness of breath.  He does report having cough productive of yellowish colored sputum.  He has been afebrile.  He has rhonchi bilaterally on exam.  He denies any significant pain in his right ankle at this time.  Right lower extremity is in a boot.  Assessment/plan:  1. Acute respiratory failure with hypoxia.  Secondary to pneumonia.  Continue to wean down oxygen as tolerated. 2. Aspiration pneumonia.  Patient noted to have bilateral pneumonia on CT chest.  He is currently on Unasyn.  Continue bronchodilators and pulmonary hygiene. 3. Syncope, likely vasovagal.  Patient noted to be hypotensive and tachycardic.  His hemodynamics have otherwise stabilized.  Troponins are negative. 4. Right distal fibula fracture. Discussed with Dr. Veverly Fells (ortho) and he recommended that patient have an L and U ankle splint, be nonweight bearing on the right leg and follow up with ortho later this week. 5. Hypothyroidism.  Continue Synthroid 6. COPD.  Continue on bronchodilators. 7. Obstructive sleep apnea.  Continue on CPAP 8. Hypertension.  Holding antihypertensive medications for now since he did have an episode of hypotension in the emergency room.  Can resume amlodipine and losartan as blood pressure will tolerate.  [employer]

## 2019-08-31 DIAGNOSIS — R55 Syncope and collapse: Secondary | ICD-10-CM | POA: Diagnosis present

## 2019-08-31 DIAGNOSIS — S82831A Other fracture of upper and lower end of right fibula, initial encounter for closed fracture: Secondary | ICD-10-CM

## 2019-08-31 DIAGNOSIS — J439 Emphysema, unspecified: Secondary | ICD-10-CM

## 2019-08-31 DIAGNOSIS — E039 Hypothyroidism, unspecified: Secondary | ICD-10-CM

## 2019-08-31 DIAGNOSIS — S82401A Unspecified fracture of shaft of right fibula, initial encounter for closed fracture: Secondary | ICD-10-CM | POA: Diagnosis present

## 2019-08-31 DIAGNOSIS — Z9989 Dependence on other enabling machines and devices: Secondary | ICD-10-CM

## 2019-08-31 DIAGNOSIS — J9601 Acute respiratory failure with hypoxia: Secondary | ICD-10-CM

## 2019-08-31 DIAGNOSIS — G4733 Obstructive sleep apnea (adult) (pediatric): Secondary | ICD-10-CM

## 2019-08-31 LAB — COMPREHENSIVE METABOLIC PANEL
ALT: 14 U/L (ref 0–44)
AST: 20 U/L (ref 15–41)
Albumin: 3.5 g/dL (ref 3.5–5.0)
Alkaline Phosphatase: 40 U/L (ref 38–126)
Anion gap: 7 (ref 5–15)
BUN: 22 mg/dL (ref 8–23)
CO2: 26 mmol/L (ref 22–32)
Calcium: 8.4 mg/dL — ABNORMAL LOW (ref 8.9–10.3)
Chloride: 99 mmol/L (ref 98–111)
Creatinine, Ser: 0.77 mg/dL (ref 0.61–1.24)
GFR calc Af Amer: >60 mL/min (ref >60)
GFR calc non Af Amer: >60 mL/min (ref >60)
Glucose, Bld: 112 mg/dL — ABNORMAL HIGH (ref 70–99)
Potassium: 3.8 mmol/L (ref 3.5–5.1)
Sodium: 132 mmol/L — ABNORMAL LOW (ref 135–145)
Total Bilirubin: 0.7 mg/dL (ref 0.3–1.2)
Total Protein: 6 g/dL — ABNORMAL LOW (ref 6.5–8.1)

## 2019-08-31 LAB — CBC
HCT: 33.4 % — ABNORMAL LOW (ref 39.0–52.0)
Hemoglobin: 11.1 g/dL — ABNORMAL LOW (ref 13.0–17.0)
MCH: 32.3 pg (ref 26.0–34.0)
MCHC: 33.2 g/dL (ref 30.0–36.0)
MCV: 97.1 fL (ref 80.0–100.0)
Platelets: 220 10*3/uL (ref 150–400)
RBC: 3.44 MIL/uL — ABNORMAL LOW (ref 4.22–5.81)
RDW: 13 % (ref 11.5–15.5)
WBC: 19.7 10*3/uL — ABNORMAL HIGH (ref 4.0–10.5)
nRBC: 0 % (ref 0.0–0.2)

## 2019-08-31 MED ORDER — ORAL CARE MOUTH RINSE
15.0000 mL | Freq: Two times a day (BID) | OROMUCOSAL | Status: DC
  Administered 2019-08-31 – 2019-09-01 (×3): 15 mL via OROMUCOSAL

## 2019-08-31 MED ORDER — METHYLPREDNISOLONE SODIUM SUCC 125 MG IJ SOLR
60.0000 mg | Freq: Two times a day (BID) | INTRAMUSCULAR | Status: DC
  Administered 2019-08-31 – 2019-09-01 (×2): 60 mg via INTRAVENOUS
  Filled 2019-08-31 (×2): qty 2

## 2019-08-31 MED ORDER — IPRATROPIUM-ALBUTEROL 0.5-2.5 (3) MG/3ML IN SOLN
3.0000 mL | Freq: Three times a day (TID) | RESPIRATORY_TRACT | Status: DC
  Administered 2019-08-31 – 2019-09-01 (×4): 3 mL via RESPIRATORY_TRACT
  Filled 2019-08-31 (×4): qty 3

## 2019-08-31 MED ORDER — SODIUM CHLORIDE 0.9 % IV SOLN
500.0000 mg | INTRAVENOUS | Status: DC
  Administered 2019-08-31 – 2019-09-01 (×2): 500 mg via INTRAVENOUS
  Filled 2019-08-31 (×2): qty 500

## 2019-08-31 NOTE — Progress Notes (Addendum)
PROGRESS NOTE    Cristian Yang  [id number] DOB: 02-06-1935 DOA: 08/29/2019 PCP: Celene Squibb, MD    Brief Narrative:  85 y/o male with a history of copd, osa on cpap, was brought to the hospital after he had a fall with a right ankle fracture. He had slipped on the ice. He was noted to be hypotensive and hypoxic on room air. CT chest showed bilateral pneumonia. He was started on intravenous antibiotics and admitted for further management.   Assessment & Plan:   Active Problems:   OSA on CPAP   Hypertension   Chronic obstructive pulmonary disease (HCC)   Acute respiratory failure with hypoxia (HCC)   Aspiration pneumonia (HCC)   Right fibular fracture   Hypothyroidism   Syncope, vasovagal  1. Acute respiratory failure with hypoxia.  Secondary to pneumonia.  Continue to wean down oxygen as tolerated. 2. Aspiration pneumonia.  Patient noted to have bilateral pneumonia on CT chest.  He is currently on Unasyn.  Continue bronchodilators and pulmonary hygiene. He had a mild fever overnight and wbc count is higher today. Will add azithromycin to antibiotic coverage and check urinary antigens for legionella 3. Syncope, likely vasovagal.  Patient noted to be hypotensive and tachycardic.  His hemodynamics have otherwise stabilized.  Troponins are negative. 4. Right distal fibula fracture. Discussed with Dr. Veverly Fells (ortho) and he recommended that patient have an L and U ankle splint, be nonweight bearing on the right leg and follow up with ortho later this week. 5. Hypothyroidism.  Continue Synthroid 6. COPD.  Continue on bronchodilators. Since the patient is wheezing, will add steroids 7. Obstructive sleep apnea.  Continue on CPAP 8. Hypertension.  Holding antihypertensive medications for now since he did have an episode of hypotension in the emergency room.  Can resume amlodipine and losartan as blood pressure will tolerate.    DVT prophylaxis: lovenox Code Status: full code Family  Communication: discussed with patient Disposition Plan: pending physical therapy evaluation, discharge once respiratory status has stabilized   Consultants:     Procedures:     Antimicrobials:   unasyn 2/8>    Subjective: Patient continues to feel short of breath and has productive cough  Objective: Vitals:   08/31/19 1434 08/31/19 1508 08/31/19 1941 08/31/19 2138  BP:  (!) 128/51  (!) 139/91  Pulse:  67  (!) 110  Resp:  18  20  Temp:  97.7 F (36.5 C)  98.2 F (36.8 C)  TempSrc:  Oral  Oral  SpO2: 91% 96% 96% 92%  Weight:      Height:        Intake/Output Summary (Last 24 hours) at 08/31/2019 2231 Last data filed at 08/31/2019 1449 Gross per 24 hour  Intake 240 ml  Output 400 ml  Net -160 ml   Filed Weights   08/29/19 2340 08/30/19 0615  Weight: 100.7 kg 102.8 kg    Examination:  General exam: Alert, awake, oriented x 3 Respiratory system: bilateral wheezing. Respiratory effort normal. Cardiovascular system:RRR. No murmurs, rubs, gallops. Gastrointestinal system: Abdomen is nondistended, soft and nontender. No organomegaly or masses felt. Normal bowel sounds heard. Central nervous system: Alert and oriented. No focal neurological deficits. Extremities: splint on right ankle Skin: No rashes, lesions or ulcers Psychiatry: Judgement and insight appear normal. Mood & affect appropriate.    Data Reviewed: I have personally reviewed following labs and imaging studies  CBC: Recent Labs  Lab 08/30/19 0233 08/31/19 0442  WBC 12.0* 19.7*  NEUTROABS  8.3*  --   HGB 13.1 11.1*  HCT 39.6 33.4*  MCV 98.3 97.1  PLT 280 [id number]   Basic Metabolic Panel: Recent Labs  Lab 08/30/19 0233 08/31/19 0442  NA 132* 132*  K 4.3 3.8  CL 98 99  CO2 29 26  GLUCOSE 157* 112*  BUN 14 22  CREATININE 1.07 0.77  CALCIUM 8.6* 8.4*   GFR: Estimated Creatinine Clearance: 81.3 mL/min (by C-G formula based on SCr of 0.77 mg/dL). Liver Function Tests: Recent Labs  Lab  08/30/19 0233 08/31/19 0442  AST 19 20  ALT 15 14  ALKPHOS 66 40  BILITOT 0.8 0.7  PROT 7.1 6.0*  ALBUMIN 4.4 3.5   No results for input(s): LIPASE, AMYLASE in the last 168 hours. No results for input(s): AMMONIA in the last 168 hours. Coagulation Profile: No results for input(s): INR, PROTIME in the last 168 hours. Cardiac Enzymes: No results for input(s): CKTOTAL, CKMB, CKMBINDEX, TROPONINI in the last 168 hours. BNP (last 3 results) No results for input(s): PROBNP in the last 8760 hours. HbA1C: No results for input(s): HGBA1C in the last 72 hours. CBG: No results for input(s): GLUCAP in the last 168 hours. Lipid Profile: No results for input(s): CHOL, HDL, LDLCALC, TRIG, CHOLHDL, LDLDIRECT in the last 72 hours. Thyroid Function Tests: No results for input(s): TSH, T4TOTAL, FREET4, T3FREE, THYROIDAB in the last 72 hours. Anemia Panel: No results for input(s): VITAMINB12, FOLATE, FERRITIN, TIBC, IRON, RETICCTPCT in the last 72 hours. Sepsis Labs: No results for input(s): PROCALCITON, LATICACIDVEN in the last 168 hours.  Recent Results (from the past 240 hour(s))  Respiratory Panel by RT PCR (Flu A&B, Covid) - Nasopharyngeal Swab     Status: None   Collection Time: 08/30/19  3:18 AM   Specimen: Nasopharyngeal Swab  Result Value Ref Range Status   SARS Coronavirus 2 by RT PCR NEGATIVE NEGATIVE Final    Comment: (NOTE) SARS-CoV-2 target nucleic acids are NOT DETECTED. The SARS-CoV-2 RNA is generally detectable in upper respiratoy specimens during the acute phase of infection. The lowest concentration of SARS-CoV-2 viral copies this assay can detect is 131 copies/mL. A negative result does not preclude SARS-Cov-2 infection and should not be used as the sole basis for treatment or other patient management decisions. A negative result may occur with  improper specimen collection/handling, submission of specimen other than nasopharyngeal swab, presence of viral mutation(s)  within the areas targeted by this assay, and inadequate number of viral copies (<131 copies/mL). A negative result must be combined with clinical observations, patient history, and epidemiological information. The expected result is Negative. Fact Sheet for Patients:  PinkCheek.be Fact Sheet for Healthcare Providers:  GravelBags.it This test is not yet ap proved or cleared by the [place] FDA and  has been authorized for detection and/or diagnosis of SARS-CoV-2 by FDA under an Emergency Use Authorization (EUA). This EUA will remain  in effect (meaning this test can be used) for the duration of the COVID-19 declaration under Section 564(b)(1) of the Act, 21 U.S.C. section 360bbb-3(b)(1), unless the authorization is terminated or revoked sooner.    Influenza A by PCR NEGATIVE NEGATIVE Final   Influenza B by PCR NEGATIVE NEGATIVE Final    Comment: (NOTE) The Xpert Xpress SARS-CoV-2/FLU/RSV assay is intended as an aid in  the diagnosis of influenza from Nasopharyngeal swab specimens and  should not be used as a sole basis for treatment. Nasal washings and  aspirates are unacceptable for Xpert Xpress SARS-CoV-2/FLU/RSV  testing.  Fact Sheet for Patients: PinkCheek.be Fact Sheet for Healthcare Providers: GravelBags.it This test is not yet approved or cleared by the [place] FDA and  has been authorized for detection and/or diagnosis of SARS-CoV-2 by  FDA under an Emergency Use Authorization (EUA). This EUA will remain  in effect (meaning this test can be used) for the duration of the  Covid-19 declaration under Section 564(b)(1) of the Act, 21  U.S.C. section 360bbb-3(b)(1), unless the authorization is  terminated or revoked. Performed at [facility], [address], [place], [place] [address]          Radiology Studies: DG Ankle Complete Right  Result  Date: 08/30/2019 CLINICAL DATA:  [place] in [place] EXAM: RIGHT ANKLE - COMPLETE 3+ VIEW COMPARISON:  None. FINDINGS: Obliquely oriented distal fibular fracture with trans syndesmotic extension. No visualized medial or posterior malleolar fractures are identified though suspect syndesmotic disruption given the presence of lateral talar shift and angulation. Circumferential soft tissue swelling and large ankle joint effusion is present. No other acute fracture is seen. Background of mild degenerative spurring of the ankle, midfoot and hindfoot. IMPRESSION: Obliquely oriented distal fibular fracture and lateral talar shift with angulation. Pattern is suggestive of a Weber B stage III/IV supination external rotation injury, raising concern for possible disruption of the posterior syndesmosis and medial collateral bands. Associated swelling and ankle effusion. Electronically Signed   By: Lovena Le M.D.   On: 08/30/2019 00:19   CT Head Wo Contrast  Result Date: 08/30/2019 CLINICAL DATA:  85 year old male with abrupt altered mental status, coughed and then became pale and unresponsive. EXAM: CT HEAD WITHOUT CONTRAST TECHNIQUE: Contiguous axial images were obtained from the base of the skull through the vertex without intravenous contrast. COMPARISON:  Head CT 11/05/2017. FINDINGS: Brain: Stable cerebral volume. No midline shift, mass effect, or evidence of intracranial mass lesion. No ventriculomegaly. Chronic confluent bilateral cerebral white matter hypodensity. Chronic deep white matter capsule involvement. Relative sparing of the basal ganglia. Stable gray-white matter differentiation throughout the brain. No acute intracranial hemorrhage identified. No cortically based acute infarct identified. Vascular: Calcified atherosclerosis at the skull base. No suspicious intracranial vascular hyperdensity. Skull: No acute osseous abnormality identified. Sinuses/Orbits: Scattered bilateral ethmoid sinus mucosal thickening  appears chronic and mildly improved. Other Visualized paranasal sinuses and mastoids are stable and well pneumatized. Other: Interval postoperative changes to the right globe. No acute orbit or scalp soft tissue finding. IMPRESSION: No acute intracranial abnormality. Stable non contrast CT appearance of the brain with chronic small vessel disease. Electronically Signed   By: Genevie [name] M.D.   On: 08/30/2019 04:59   CT Angio Chest PE W/Cm &/Or Wo Cm  Result Date: 08/30/2019 CLINICAL DATA:  85 year old male with abrupt altered mental status, coughed and then became pale and unresponsive. EXAM: CT ANGIOGRAPHY CHEST WITH CONTRAST TECHNIQUE: Multidetector CT imaging of the chest was performed using the standard protocol during bolus administration of intravenous contrast. Multiplanar CT image reconstructions and MIPs were obtained to evaluate the vascular anatomy. CONTRAST:  46mL OMNIPAQUE IOHEXOL 350 MG/ML SOLN COMPARISON:  Portable chest earlier tonight. CT Abdomen and Pelvis 11/05/2017. FINDINGS: Cardiovascular: Adequate contrast bolus timing in the pulmonary arterial tree. However there is significant respiratory motion. There is no central or hilar pulmonary artery filling defect identified. Upper lobe branch detail is superior to lower lobe detail. No convincing upper lobe pulmonary artery filling defect. Lower lobe branches are significantly obscured and not well evaluated. Cardiomegaly. No pericardial effusion. Negative visible aorta aside  from mild atherosclerosis. No definite calcified coronary artery atherosclerosis. Mediastinum/Nodes: Chronic hiatal hernia contained mostly fat in 2019, now contains a small portion of the stomach also. No mediastinal or hilar lymphadenopathy. Lungs/Pleura: Respiratory motion which also involves the major airways. Grossly patent major airways. The right upper lobe is clear. There is patchy and tree-in-bud nodularity in the posterior left upper lobe on series 6, image 54. There  is confluent bilateral lower lobe peribronchial opacity with areas of early consolidation, primarily involving medial and posterior basal segments and greater on the left. Less pronounced involvement of the costophrenic angles. Minimal middle lobe peribronchial involvement. No pleural effusion. Upper Abdomen: Negative visible liver, gallbladder, spleen, adrenal glands and kidneys. Partially visible pancreatic atrophy. Negative visible bowel aside from the small gastric hiatal hernia. Musculoskeletal: Osteopenia. Partially visible lower cervical ACDF. Thoracic spine degeneration. No acute or suspicious osseous lesion. Review of the MIP images confirms the above findings. IMPRESSION: 1. Limited evaluation for pulmonary embolus due to respiratory motion. No central or hilar pulmonary embolus identified. 2. Bilateral Pneumonia with confluent bilateral lower lobe peribronchial opacity, involvement of the posterior left upper lobe. Aspiration should be considered in this setting. No pleural effusion. 3. Cardiomegaly. Aortic Atherosclerosis (ICD10-I70.0). 4. Small to moderate hiatal hernia. Electronically Signed   By: Genevie [name] M.D.   On: 08/30/2019 04:57   DG Chest Port 1 View  Result Date: 08/30/2019 CLINICAL DATA:  Cough and hypoxia. EXAM: PORTABLE CHEST 1 VIEW COMPARISON:  07/09/2018 FINDINGS: Artifact overlies the chest. Chronically prominent interstitial lung markings. No sign of active consolidation, collapse or effusion. No acute bone finding. IMPRESSION: No active disease detected.  Chronic interstitial lung markings. Electronically Signed   By: Nelson Chimes M.D.   On: 08/30/2019 01:13        Scheduled Meds: . aspirin EC  81 mg Oral Daily  . citalopram  20 mg Oral QHS  . enoxaparin (LOVENOX) injection  0.5 mg/kg Subcutaneous Q24H  . gabapentin  300 mg Oral TID  . guaiFENesin  1,200 mg Oral BID  . ipratropium-albuterol  3 mL Nebulization TID  . levothyroxine  75 mcg Oral QAC breakfast  . mouth rinse   15 mL Mouth Rinse BID  . montelukast  10 mg Oral QHS  . pantoprazole  40 mg Oral Daily   Continuous Infusions: . sodium chloride 10 mL/hr at 08/30/19 0657  . ampicillin-sulbactam (UNASYN) IV 3 g (08/31/19 2120)  . azithromycin 500 mg (08/31/19 1053)     LOS: 1 day    Time spent: 110mins    Kathie Dike, MD [facility]   If 7PM-7AM, please contact night-coverage [email or web address]  08/31/2019, 10:31 PM

## 2019-08-31 NOTE — Progress Notes (Signed)
Pt placed on 4CMH20 CPAP with 4 lpm oxygen bled in.  Patient tolerating well.

## 2019-09-01 DIAGNOSIS — J690 Pneumonitis due to inhalation of food and vomit: Principal | ICD-10-CM

## 2019-09-01 LAB — CBC
HCT: 35.6 % — ABNORMAL LOW (ref 39.0–52.0)
Hemoglobin: 11.8 g/dL — ABNORMAL LOW (ref 13.0–17.0)
MCH: 32 pg (ref 26.0–34.0)
MCHC: 33.1 g/dL (ref 30.0–36.0)
MCV: 96.5 fL (ref 80.0–100.0)
Platelets: 229 10*3/uL (ref 150–400)
RBC: 3.69 MIL/uL — ABNORMAL LOW (ref 4.22–5.81)
RDW: 12.9 % (ref 11.5–15.5)
WBC: 9.6 10*3/uL (ref 4.0–10.5)
nRBC: 0 % (ref 0.0–0.2)

## 2019-09-01 LAB — BASIC METABOLIC PANEL
Anion gap: 8 (ref 5–15)
BUN: 16 mg/dL (ref 8–23)
CO2: 26 mmol/L (ref 22–32)
Calcium: 8.4 mg/dL — ABNORMAL LOW (ref 8.9–10.3)
Chloride: 99 mmol/L (ref 98–111)
Creatinine, Ser: 0.69 mg/dL (ref 0.61–1.24)
GFR calc Af Amer: >60 mL/min (ref >60)
GFR calc non Af Amer: >60 mL/min (ref >60)
Glucose, Bld: 151 mg/dL — ABNORMAL HIGH (ref 70–99)
Potassium: 4.2 mmol/L (ref 3.5–5.1)
Sodium: 133 mmol/L — ABNORMAL LOW (ref 135–145)

## 2019-09-01 LAB — STREP PNEUMONIAE URINARY ANTIGEN: Strep Pneumo Urinary Antigen: NEGATIVE

## 2019-09-01 MED ORDER — AMOXICILLIN-POT CLAVULANATE 875-125 MG PO TABS: 1.0000 | ORAL_TABLET | Freq: Two times a day (BID) | ORAL | 0 refills | Status: AC

## 2019-09-01 MED ORDER — LOSARTAN POTASSIUM 50 MG PO TABS: 50.0000 mg | ORAL_TABLET | Freq: Every day | ORAL | 2 refills | Status: DC

## 2019-09-01 MED ORDER — GUAIFENESIN ER 600 MG PO TB12: 600.0000 mg | ORAL_TABLET | Freq: Two times a day (BID) | ORAL | 0 refills | Status: DC

## 2019-09-01 MED ORDER — ASPIRIN 81 MG PO TBEC: 81.0000 mg | DELAYED_RELEASE_TABLET | Freq: Every day | ORAL | 1 refills | Status: DC

## 2019-09-01 MED ORDER — PREDNISONE 20 MG PO TABS: 40.0000 mg | ORAL_TABLET | Freq: Every day | ORAL | 0 refills | Status: DC

## 2019-09-01 MED ORDER — OMEPRAZOLE 20 MG PO CPDR: 20.0000 mg | DELAYED_RELEASE_CAPSULE | Freq: Every morning | ORAL | 3 refills | Status: DC

## 2019-09-01 NOTE — Care Management Important Message (Signed)
Important Message  Patient Details  Name: Cristian Yang MRN: [id number] Date of Birth: 1935-05-24   Medicare Important Message Given:  Yes     [name] 09/01/2019, 3:28 PM

## 2019-09-01 NOTE — Progress Notes (Signed)
  Speech Language Pathology Treatment: Dysphagia  Patient Details Name: Cristian Yang MRN: [id number] DOB: [birth year] Today's Date: 09/01/2019 Time: [id number] SLP Time Calculation (min) (ACUTE ONLY): 10 min  Assessment / Plan / Recommendation Clinical Impression  Pt seen in room while sitting up in chair with lunch meal of regular textures and thin liquids. Pt reports no difficulty swallowing and he shows no overt signs or symptoms of aspiration. Pt was encouraged to continue diet as ordered and continue with small sips/bites and slow rate. Pt in agreement with plan. No further SLP services indicated at this time. SLP will sign off.    HPI HPI: Cristian Yang  is a 85 y.o. male, with history of COPD, OSA on CPAP, depression, hypertension, hyperlipidemia, hypothyroidism who initially came to hospital after patient slipped in the snow and sustained injury to right ankle.  As per patient he was walking outside in the flip-flops walking in the snow taking his trash can out when the flip-flop broke and he slipped in the snow injuring his right ankle.  EMS reported patient's initial blood pressure was 85/50, pulse ox was 88% on room air.  Patient is not on oxygen at home. X-ray of ankle showed obliquely oriented distal fibular fracture and lateral talar shift and angulation. In the ED patient had an episode of syncope, after he had a bout of coughing.  His pulse ox dropped to 70s, improved after putting on oxygen via nasal cannula, blood pressure was 73/59, improved to 93% off putting on oxygen via nasal cannula, he received 500 cc bolus of fluid.  CTA chest was done which was negative for PE but showed bilateral pneumonia with confluent bilateral lower lobe peribronchial opacity with also involvement of posterior left upper lobe,?  Aspiration. BSE ordered. He had BSE 11/07/17 at [facility] with recommendation for D3/thin and suspected esophageal component.       SLP Plan  Discharge SLP treatment due to  (comment);All goals met       Recommendations  Diet recommendations: Regular;Thin liquid Liquids provided via: Cup;Straw Medication Administration: Whole meds with liquid Supervision: Patient able to self feed Compensations: Slow rate;Small sips/bites Postural Changes and/or Swallow Maneuvers: Seated upright 90 degrees;Upright 30-60 min after meal;Out of bed for meals                Oral Care Recommendations: Oral care BID Follow up Recommendations: None SLP Visit Diagnosis: Dysphagia, unspecified (R13.10) Plan: Discharge SLP treatment due to (comment);All goals met       Thank you,  Cristian Yang, [place]                 [place] 09/01/2019, 2:06 PM

## 2019-09-01 NOTE — Discharge Summary (Signed)
Cristian Yang, is a 85 y.o. male  DOB 03/17/1935  MRN [id number].  Admission date:  08/29/2019  Admitting Physician  Oswald Hillock, MD  Discharge Date:  09/01/2019   Primary MD  Celene Squibb, MD  Recommendations for primary care physician for things to follow:   1)Stop Amlodipine/Norvasc as your blood pressure tends to run low lately 2)Decrease Losartan to 50 mg daily from 100 mg daily as your blood pressure tends to run low lately 3)STop Meloxicam and Avoid ibuprofen/Advil/Aleve/Motrin/Goody Powders/Naproxen/BC powders/Meloxicam/Diclofenac/Indomethacin and other Nonsteroidal anti-inflammatory medications as these will make you more likely to bleed and can cause stomach ulcers, can also cause Kidney problems.  4) use oxygen as prescribed--avoid open fires while using oxygen 5) follow-up with orthopedic surgeon as previously advised, Dr. Veverly Fells (ortho)  recommended L and U ankle splint, be nonweight bearing on the right leg . 6)  Wear the L and U ankle Splint  for comfort.  You may also need to use a walker with it (no weight bearing allowed on Rt Foot)., Please Elevate your foot.  Admission Diagnosis  Vasovagal syncope [R55] Aspiration pneumonia ([place]) [J69.0] Bronchitis [J40] Hypoxia [R09.02] Unresponsive episode [R41.89] Closed fracture of right ankle, initial encounter [S82.891A] Aspiration pneumonia of both lungs, unspecified aspiration pneumonia type, unspecified part of lung ([place]) [J69.0]   Discharge Diagnosis  Vasovagal syncope [R55] Aspiration pneumonia ([place]) [J69.0] Bronchitis [J40] Hypoxia [R09.02] Unresponsive episode [R41.89] Closed fracture of right ankle, initial encounter [S82.891A] Aspiration pneumonia of both lungs, unspecified aspiration pneumonia type, unspecified part of lung ([place]) [J69.0]    Principal Problem:   Aspiration pneumonia (HCC) Active Problems:   Chronic obstructive  pulmonary disease (HCC)   Acute respiratory failure with hypoxia ([place])   Hypertension   Right fibular fracture   OSA on CPAP   Hypothyroidism   Syncope, vasovagal      Past Medical History:  Diagnosis Date  . Arthritis   . Asthma   . Chronic back pain   . COPD (chronic obstructive pulmonary disease) ([place])   . Depression   . GERD (gastroesophageal reflux disease)   . Hyperlipidemia   . Hypertension   . Hypothyroidism   . OSA on CPAP   . Skin cancer   . Sleep apnea    uses CPAP nightly    Past Surgical History:  Procedure Laterality Date  . CATARACT EXTRACTION W/PHACO Left 02/13/2016   Procedure: CATARACT EXTRACTION PHACO AND INTRAOCULAR LENS PLACEMENT (IOC);  Surgeon: Rutherford Guys, MD;  Location: [facility];  Service: Ophthalmology;  Laterality: Left;  CDE: 9.23  . CATARACT EXTRACTION W/PHACO Right 05/28/2019   Procedure: CATARACT EXTRACTION PHACO AND INTRAOCULAR LENS PLACEMENT (IOC);  Surgeon: Baruch Goldmann, MD;  Location: [facility];  Service: Ophthalmology;  Laterality: Right;  CDE: 9.51  . NECK SURGERY     cervical disc  . PROSTATE SURGERY     TURP, laser  . SKIN CANCER EXCISION  2013  . skin cancer removed     x3  HPI  from the history and physical done on the day of admission:     Cristian Yang  is a 85 y.o. male, with history of COPD, OSA on CPAP, depression, hypertension, hyperlipidemia, hypothyroidism who initially came to hospital after patient slipped in the snow and sustained injury to right ankle.  As per patient he was walking outside in the flip-flops walking in the snow taking his trash can out when the flip-flop broke and he slipped in the snow injuring his right ankle.  EMS reported patient's initial blood pressure was 85/50, pulse ox was 88% on room air.  Patient is not on oxygen at home.  X-ray of ankle showed obliquely oriented distal fibular fracture and lateral talar shift and angulation.  In the ED patient had an episode of syncope, after he had a  bout of coughing.  His pulse ox dropped to 70s, improved after putting on oxygen via nasal cannula, blood pressure was 73/59, improved to 93% off putting on oxygen via nasal cannula, he received 500 cc bolus of fluid.  CTA chest was done which was negative for PE but showed bilateral pneumonia with confluent bilateral lower lobe peribronchial opacity with also involvement of posterior left upper lobe,?  Aspiration.   Now patient is requiring 9 L/min of oxygen via nasal cannula.  He denies nausea vomiting or diarrhea Denies chest pain Denies fever or chills Denies dysuria    Hospital Course:     1. Acute respiratory failure with hypoxia. Secondary to pneumonia superimposed on underlying COPD. -Hypoxia persist, unable to wean off O2, okay to discharge home on home oxygen   2. aspiration pneumonia--clinically improved on Unasyn, okay to discharge home on p.o. Augmentin.  Speech evaluation appreciated   3. syncope, likely vasovagal Vs orthostatic--patient noted to be hypotensive and tachycardic. His hemodynamics have otherwise stabilized. Troponins are negative. -Suspect that patient's low blood pressures contributory, stop amlodipine, decrease losartan to 50 mg daily from 100 mg   4. Rightdistal fibula fracture. Discussed with Dr. Veverly Fells (ortho) and he recommended that patient have an L and U ankle splint, be nonweight bearing on the right leg and follow up with ortho later this week.   5. Hypothyroidism. Continue Synthroid   6)COPD.Continue on bronchodilators.  Hypoxia most likely related to underlying COPD, discharged home on home O2, bronchodilators and mucolytics  7)Obstructive sleep apnea. Continue on CPAP  8)Hypertension. --- See BP medication adjustments in #3 above  DVT prophylaxis: lovenox Code Status: full code Family Communication: discussed with patient Disposition Plan:  Discharge home with home oxygen, outpatient follow-up with orthopedic surgeon as  advised  Discharge Condition: Stable  Follow UP  Follow-up Information    Celene Squibb, MD.   Specialty: Internal Medicine Contact information: [place] [address] [phone]        Haddix, Thomasene Lot, MD.   Specialty: Orthopedic Surgery Contact information: [place] [address] [phone]        Carole Civil, MD.   Specialties: Orthopedic Surgery, Radiology Contact information: [place] [address] [phone]            Consults obtained - speech eval  Diet and Activity recommendation:  As advised  Discharge Instructions     Discharge Instructions    Call MD for:  difficulty breathing, headache or visual disturbances   Complete by: As directed    Call MD for:  extreme fatigue   Complete by: As directed  Call MD for:  persistant dizziness or light-headedness   Complete by: As directed    Call MD for:  persistant nausea and vomiting   Complete by: As directed    Call MD for:  severe uncontrolled pain   Complete by: As directed    Call MD for:  temperature >100.4   Complete by: As directed    Diet - low sodium heart healthy   Complete by: As directed    Discharge instructions   Complete by: As directed    1)Stop Amlodipine/Norvasc as your blood pressure tends to run low lately 2)Decrease Losartan to 50 mg daily from 100 mg daily as your blood pressure tends to run low lately 3)STop Meloxicam and Avoid ibuprofen/Advil/Aleve/Motrin/Goody Powders/Naproxen/BC powders/Meloxicam/Diclofenac/Indomethacin and other Nonsteroidal anti-inflammatory medications as these will make you more likely to bleed and can cause stomach ulcers, can also cause Kidney problems.  4) use oxygen as prescribed--avoid open fires while using oxygen 5) follow-up with orthopedic surgeon as previously advised, Dr. Veverly Fells (ortho)  recommended L and U ankle splint, be nonweight bearing on the right leg . 6)  Wear  the L and U ankle Splint  for comfort.  You may also need to use a walker with it (no weight bearing allowed on Rt Foot)., Please Elevate your foot.   Increase activity slowly   Complete by: As directed         Discharge Medications     Allergies as of 09/01/2019      Reactions   Ciprofloxacin Nausea And Vomiting   Codeine Nausea And Vomiting      Medication List    STOP taking these medications   albuterol (2.5 MG/3ML) 0.083% nebulizer solution Commonly known as: PROVENTIL   amLODipine 2.5 MG tablet Commonly known as: NORVASC   meloxicam 15 MG tablet Commonly known as: MOBIC     TAKE these medications   amoxicillin-clavulanate 875-125 MG tablet Commonly known as: Augmentin Take 1 tablet by mouth 2 (two) times daily for 5 days.   aspirin 81 MG EC tablet Take 1 tablet (81 mg total) by mouth daily with breakfast. What changed:   how much to take  when to take this   citalopram 20 MG tablet Commonly known as: CELEXA Take 1 tablet (20 mg total) by mouth at bedtime.   docusate sodium 100 MG capsule Commonly known as: COLACE TAKE 1 CAPSULE BY MOUTH IN THE MORNING What changed: See the new instructions.   gabapentin 300 MG capsule Commonly known as: NEURONTIN TAKE 1 CAPSULE BY MOUTH IN THE MORNING TAKE 2 CAPSULE IN THE EVENING What changed:   how much to take  how to take this  when to take this   GNP Melatonin 3 MG Tabs Generic drug: Melatonin Take 1 tablet by mouth at bedtime.   guaiFENesin 600 MG 12 hr tablet Commonly known as: MUCINEX Take 1 tablet (600 mg total) by mouth 2 (two) times daily.   HYDROcodone-acetaminophen 5-325 MG tablet Commonly known as: NORCO/VICODIN Take 1 tablet by mouth every 6 (six) hours as needed for moderate pain or severe pain. What changed: reasons to take this   levothyroxine 75 MCG tablet Commonly known as: SYNTHROID Take 1 tablet (75 mcg total) by mouth daily before breakfast.   lidocaine 5 % ointment Commonly  known as: XYLOCAINE Apply 1 application topically 3 (three) times daily as needed. Apply to affected area   losartan 50 MG tablet Commonly known as: COZAAR Take 1 tablet (50 mg total)  by mouth at bedtime. What changed:   medication strength  how much to take   mometasone 50 MCG/ACT nasal spray Commonly known as: NASONEX SPRAY 2 SPRAYS INTO EACH NOSTRIL ONCE DAILY What changed:   how much to take  how to take this  when to take this  additional instructions   montelukast 10 MG tablet Commonly known as: SINGULAIR Take 1 tablet (10 mg total) by mouth at bedtime.   multivitamin Tabs tablet Take 1 tablet by mouth every morning.   omeprazole 20 MG capsule Commonly known as: PRILOSEC Take 1 capsule (20 mg total) by mouth every morning.   predniSONE 20 MG tablet Commonly known as: Deltasone Take 2 tablets (40 mg total) by mouth daily with breakfast.   Trelegy Ellipta 100-62.5-25 MCG/INH Aepb Generic drug: Fluticasone-Umeclidin-Vilant INHALE 1 PUFF INTO THE LUNGS ONCE DAILY. What changed: See the new instructions.   Tums Ultra 1000 400 MG chewable tablet Generic drug: calcium elemental as carbonate Chew 3,000 mg by mouth at bedtime.            Durable Medical Equipment  (From admission, onward)         Start     Ordered   09/01/19 1553  For home use only DME Walker  Once    Comments: Generalized Weakness/Deconditioning and acute hypoxic respiratory failure secondary to underlying COPD-  Question:  Patient needs a walker to treat with the following condition  Answer:  COPD (chronic obstructive pulmonary disease) ([place])   09/01/19 1552   09/01/19 1551  For home use only DME standard manual wheelchair with seat cushion  Once    Comments: Patient suffers from Generalized Weakness/Deconditioning and acute hypoxic respiratory failure secondary to underlying COPD- which impairs their ability to perform daily activities like bathing, dressing, feeding, grooming and  toileting in the home.  A walker will not resolve issue with performing activities of daily living. A wheelchair will allow patient to safely perform daily activities. Patient can safely propel the wheelchair in the home or has a caregiver who can provide assistance. Length of need Lifetime. Accessories: elevating leg rests (ELRs), wheel locks, extensions and anti-tippers.   09/01/19 1551   09/01/19 1536  For home use only DME 3 n 1  Once    Comments: Generalized Weakness/Deconditioning and acute hypoxic respiratory failure secondary to underlying COPD-   09/01/19 1550   09/01/19 1519  For home use only DME oxygen  Once    Comments: SATURATION QUALIFICATIONS: (Thisnote is usedto comply with regulatory documentation for home oxygen)  Patient Saturations on Room Air at Rest =92 %  Patient Saturations on Room Air while Ambulating =87 %  Patient Saturations on2Liters of oxygen while Ambulating = 93 %   Dx --COPD  Patient needs continuous O2 at 2 L/min continuously via nasal cannula with humidifier, with gaseous portability and conserving device  Question Answer Comment  Length of Need Lifetime   Frequency Continuous (stationary and portable oxygen unit needed)   Oxygen conserving device Yes   Oxygen delivery system Gas      09/01/19 1523          Major procedures and Radiology Reports - PLEASE review detailed and final reports for all details, in brief -    DG Ankle Complete Right  Result Date: 08/30/2019 CLINICAL DATA:  [place] EXAM: RIGHT ANKLE - COMPLETE 3+ VIEW COMPARISON:  None. FINDINGS: Obliquely oriented distal fibular fracture with trans syndesmotic extension. No visualized medial or posterior malleolar fractures  are identified though suspect syndesmotic disruption given the presence of lateral talar shift and angulation. Circumferential soft tissue swelling and large ankle joint effusion is present. No other acute fracture is seen. Background of mild  degenerative spurring of the ankle, midfoot and hindfoot. IMPRESSION: Obliquely oriented distal fibular fracture and lateral talar shift with angulation. Pattern is suggestive of a Weber B stage III/IV supination external rotation injury, raising concern for possible disruption of the posterior syndesmosis and medial collateral bands. Associated swelling and ankle effusion. Electronically Signed   By: Lovena Le M.D.   On: 08/30/2019 00:19   CT Head Wo Contrast  Result Date: 08/30/2019 CLINICAL DATA:  85 year old male with abrupt altered mental status, coughed and then became pale and unresponsive. EXAM: CT HEAD WITHOUT CONTRAST TECHNIQUE: Contiguous axial images were obtained from the base of the skull through the vertex without intravenous contrast. COMPARISON:  Head CT 11/05/2017. FINDINGS: Brain: Stable cerebral volume. No midline shift, mass effect, or evidence of intracranial mass lesion. No ventriculomegaly. Chronic confluent bilateral cerebral white matter hypodensity. Chronic deep white matter capsule involvement. Relative sparing of the basal ganglia. Stable gray-white matter differentiation throughout the brain. No acute intracranial hemorrhage identified. No cortically based acute infarct identified. Vascular: Calcified atherosclerosis at the skull base. No suspicious intracranial vascular hyperdensity. Skull: No acute osseous abnormality identified. Sinuses/Orbits: Scattered bilateral ethmoid sinus mucosal thickening appears chronic and mildly improved. Other Visualized paranasal sinuses and mastoids are stable and well pneumatized. Other: Interval postoperative changes to the right globe. No acute orbit or scalp soft tissue finding. IMPRESSION: No acute intracranial abnormality. Stable non contrast CT appearance of the brain with chronic small vessel disease. Electronically Signed   By: Genevie [name] M.D.   On: 08/30/2019 04:59   CT Angio Chest PE W/Cm &/Or Wo Cm  Result Date: 08/30/2019 CLINICAL  DATA:  85 year old male with abrupt altered mental status, coughed and then became pale and unresponsive. EXAM: CT ANGIOGRAPHY CHEST WITH CONTRAST TECHNIQUE: Multidetector CT imaging of the chest was performed using the standard protocol during bolus administration of intravenous contrast. Multiplanar CT image reconstructions and MIPs were obtained to evaluate the vascular anatomy. CONTRAST:  48mL OMNIPAQUE IOHEXOL 350 MG/ML SOLN COMPARISON:  Portable chest earlier tonight. CT Abdomen and Pelvis 11/05/2017. FINDINGS: Cardiovascular: Adequate contrast bolus timing in the pulmonary arterial tree. However there is significant respiratory motion. There is no central or hilar pulmonary artery filling defect identified. Upper lobe branch detail is superior to lower lobe detail. No convincing upper lobe pulmonary artery filling defect. Lower lobe branches are significantly obscured and not well evaluated. Cardiomegaly. No pericardial effusion. Negative visible aorta aside from mild atherosclerosis. No definite calcified coronary artery atherosclerosis. Mediastinum/Nodes: Chronic hiatal hernia contained mostly fat in 2019, now contains a small portion of the stomach also. No mediastinal or hilar lymphadenopathy. Lungs/Pleura: Respiratory motion which also involves the major airways. Grossly patent major airways. The right upper lobe is clear. There is patchy and tree-in-bud nodularity in the posterior left upper lobe on series 6, image 54. There is confluent bilateral lower lobe peribronchial opacity with areas of early consolidation, primarily involving medial and posterior basal segments and greater on the left. Less pronounced involvement of the costophrenic angles. Minimal middle lobe peribronchial involvement. No pleural effusion. Upper Abdomen: Negative visible liver, gallbladder, spleen, adrenal glands and kidneys. Partially visible pancreatic atrophy. Negative visible bowel aside from the small gastric hiatal  hernia. Musculoskeletal: Osteopenia. Partially visible lower cervical ACDF. Thoracic spine degeneration. No acute or  suspicious osseous lesion. Review of the MIP images confirms the above findings. IMPRESSION: 1. Limited evaluation for pulmonary embolus due to respiratory motion. No central or hilar pulmonary embolus identified. 2. Bilateral Pneumonia with confluent bilateral lower lobe peribronchial opacity, involvement of the posterior left upper lobe. Aspiration should be considered in this setting. No pleural effusion. 3. Cardiomegaly. Aortic Atherosclerosis (ICD10-I70.0). 4. Small to moderate hiatal hernia. Electronically Signed   By: Genevie [name] M.D.   On: 08/30/2019 04:57   DG Chest Port 1 View  Result Date: 08/30/2019 CLINICAL DATA:  Cough and hypoxia. EXAM: PORTABLE CHEST 1 VIEW COMPARISON:  07/09/2018 FINDINGS: Artifact overlies the chest. Chronically prominent interstitial lung markings. No sign of active consolidation, collapse or effusion. No acute bone finding. IMPRESSION: No active disease detected.  Chronic interstitial lung markings. Electronically Signed   By: Nelson Chimes M.D.   On: 08/30/2019 01:13    Micro Results   Recent Results (from the past 240 hour(s))  Respiratory Panel by RT PCR (Flu A&B, Covid) - Nasopharyngeal Swab     Status: None   Collection Time: 08/30/19  3:18 AM   Specimen: Nasopharyngeal Swab  Result Value Ref Range Status   SARS Coronavirus 2 by RT PCR NEGATIVE NEGATIVE Final    Comment: (NOTE) SARS-CoV-2 target nucleic acids are NOT DETECTED. The SARS-CoV-2 RNA is generally detectable in upper respiratoy specimens during the acute phase of infection. The lowest concentration of SARS-CoV-2 viral copies this assay can detect is 131 copies/mL. A negative result does not preclude SARS-Cov-2 infection and should not be used as the sole basis for treatment or other patient management decisions. A negative result may occur with  improper specimen  collection/handling, submission of specimen other than nasopharyngeal swab, presence of viral mutation(s) within the areas targeted by this assay, and inadequate number of viral copies (<131 copies/mL). A negative result must be combined with clinical observations, patient history, and epidemiological information. The expected result is Negative. Fact Sheet for Patients:  PinkCheek.be Fact Sheet for Healthcare Providers:  GravelBags.it This test is not yet ap proved or cleared by the [place] FDA and  has been authorized for detection and/or diagnosis of SARS-CoV-2 by FDA under an Emergency Use Authorization (EUA). This EUA will remain  in effect (meaning this test can be used) for the duration of the COVID-19 declaration under Section 564(b)(1) of the Act, 21 U.S.C. section 360bbb-3(b)(1), unless the authorization is terminated or revoked sooner.    Influenza A by PCR NEGATIVE NEGATIVE Final   Influenza B by PCR NEGATIVE NEGATIVE Final    Comment: (NOTE) The Xpert Xpress SARS-CoV-2/FLU/RSV assay is intended as an aid in  the diagnosis of influenza from Nasopharyngeal swab specimens and  should not be used as a sole basis for treatment. Nasal washings and  aspirates are unacceptable for Xpert Xpress SARS-CoV-2/FLU/RSV  testing. Fact Sheet for Patients: PinkCheek.be Fact Sheet for Healthcare Providers: GravelBags.it This test is not yet approved or cleared by the [place] FDA and  has been authorized for detection and/or diagnosis of SARS-CoV-2 by  FDA under an Emergency Use Authorization (EUA). This EUA will remain  in effect (meaning this test can be used) for the duration of the  Covid-19 declaration under Section 564(b)(1) of the Act, 21  U.S.C. section 360bbb-3(b)(1), unless the authorization is  terminated or revoked. Performed at [facility],  [address], [place], [place] [address]        Today   Subjective  Cristian Yang today has no new complaints -Cough and shortness of breath improving, hypoxia persist no chest pains no palpitations dizziness improved          Patient has been seen and examined prior to discharge   Objective   Blood pressure (!) 139/91, pulse 70, temperature 98.2 F (36.8 C), temperature source Oral, resp. rate 19, height 5\' 8"  (1.727 m), weight 102.8 kg, SpO2 93 %.   Intake/Output Summary (Last 24 hours) at 09/01/2019 1652 Last data filed at 09/01/2019 0745 Gross per 24 hour  Intake 961.78 ml  Output 1725 ml  Net -763.22 ml    Exam Gen:- Awake Alert, no acute distress  HEENT:- [place].AT, No sclera icterus Nose- [place] 2L/min Neck-Supple Neck,No JVD,.  Lungs-improving air movement, no wheezing  CV- S1, S2 normal, regular Abd-  +ve B.Sounds, Abd Soft, No tenderness,    Extremity/Skin:-Right ankle with brace/splint, good pulses Psych-affect is appropriate, oriented x3 Neuro-no new focal deficits, no tremors    Data Review   CBC w Diff:  Lab Results  Component Value Date   WBC 9.6 09/01/2019   HGB 11.8 (L) 09/01/2019   HCT 35.6 (L) 09/01/2019   PLT 229 09/01/2019   LYMPHOPCT 12 08/30/2019   BANDSPCT 18 11/06/2017   MONOPCT 18 08/30/2019   EOSPCT 0 08/30/2019   BASOPCT 0 08/30/2019    CMP:  Lab Results  Component Value Date   NA 133 (L) 09/01/2019   NA 141 10/21/2017   K 4.2 09/01/2019   CL 99 09/01/2019   CO2 26 09/01/2019   BUN 16 09/01/2019   BUN 7 (L) 10/21/2017   CREATININE 0.69 09/01/2019   PROT 6.0 (L) 08/31/2019   PROT 6.3 10/21/2017   ALBUMIN 3.5 08/31/2019   ALBUMIN 4.1 10/21/2017   BILITOT 0.7 08/31/2019   BILITOT 0.3 10/21/2017   ALKPHOS 40 08/31/2019   AST 20 08/31/2019   ALT 14 08/31/2019  .   Total Discharge time is about 33 minutes  Roxan Hockey M.D on 09/01/2019 at 4:52 PM  Go to [email or web address] -  for contact info  [facility] - Office   [phone]

## 2019-09-01 NOTE — Progress Notes (Signed)
SATURATION QUALIFICATIONS: (Thisnote is usedto comply with regulatory documentation for home oxygen)  Patient Saturations on Room Air at Rest =92 %  Patient Saturations on Room Air while Ambulating =87 %  Patient Saturations on2Liters of oxygen while Ambulating = 93 %   Dx --COPD  Patient needs continuous O2 at 2 L/min continuously via nasal cannula with humidifier, with gaseous portability and conserving device

## 2019-09-01 NOTE — Discharge Instructions (Signed)
1)Stop Amlodipine/Norvasc as your blood pressure tends to run low lately 2)Decrease Losartan to 50 mg daily from 100 mg daily as your blood pressure tends to run low lately 3)STop Meloxicam and Avoid ibuprofen/Advil/Aleve/Motrin/Goody Powders/Naproxen/BC powders/Meloxicam/Diclofenac/Indomethacin and other Nonsteroidal anti-inflammatory medications as these will make you more likely to bleed and can cause stomach ulcers, can also cause Kidney problems.  4) use oxygen as prescribed--avoid open fires while using oxygen 5) follow-up with orthopedic surgeon as previously advised, Cristian Yang (ortho)  recommended L and U ankle splint, be nonweight bearing on the right leg . 6)  Wear the L and U ankle Splint  for comfort.  You may also need to use a walker with it (no weight bearing allowed on Rt Foot)., Please Elevate your foot.

## 2019-09-01 NOTE — Evaluation (Addendum)
Physical Therapy Evaluation Patient Details Name: Cristian Yang MRN: [id number] DOB: Mar 11, 1935 Today's Date: 09/01/2019   History of Present Illness  Cristian Yang  is a 85 y.o. male, with history of COPD, OSA on CPAP, depression, hypertension, hyperlipidemia, hypothyroidism who initially came to hospital after patient slipped in the snow and sustained injury to right ankle.  As per patient he X-ray of ankle showed obliquely oriented distal fibular fracture and lateral talar shift and angulationwas walking outside in the flip-flops walking in the snow taking his trash can out when the flip-flop broke and he slipped in the snow injuring his right ankle.  EMS reported patient's initial blood pressure was 85/50, pulse ox was 88% on room air.  Patient is not on oxygen at home.    Clinical Impression  Patient demonstrates good return for bed mobility, has difficulty completing sit to stands due to weakness and maintaining NWB on right ankle, limited to taking steps in room mostly due to fatigue and can keep RLE off floor most of time when taking steps, but has to occasionally rest right foot on floor due to fatigue.  Patient tolerated sitting up in chair after therapy - nursing staff notified.  Patient will benefit from continued physical therapy in hospital and recommended venue below to increase strength, balance, endurance for safe ADLs and gait.  Patient suffers from acute distal ankle fracture which limits his ability to perform daily activities like walking, prolonged standing and completing household ADLs in the home.  A walker alone will not resolve the issues with performing activities of daily living. A wheelchair will allow patient to safely perform daily activities.  The patient can self propel in the home or has a caregiver who can provide assistance.        Follow Up Recommendations SNF;Supervision for mobility/OOB;Supervision - Intermittent    Equipment Recommendations  Wheelchair  (measurements PT);Wheelchair cushion (measurements PT)    Recommendations for Other Services       Precautions / Restrictions Precautions Precautions: Fall Restrictions Weight Bearing Restrictions: Yes RLE Weight Bearing: Non weight bearing      Mobility  Bed Mobility Overal bed mobility: Modified Independent                Transfers Overall transfer level: Needs assistance Equipment used: Rolling walker (2 wheeled) Transfers: Sit to/from [facility] Sit to Stand: Min assist Stand pivot transfers: Min assist       General transfer comment: slow labored movement with fair/good return for keeping right foot off floor  Ambulation/Gait Ambulation/Gait assistance: Min assist;Mod assist Gait Distance (Feet): 16 Feet Assistive device: Rolling walker (2 wheeled) Gait Pattern/deviations: Decreased step length - left;Step-to pattern;Decreased stride length Gait velocity: slow   General Gait Details: slow labored cadence with fair return for keeping RLE off floor, occasionally touches right foot down due to weakness and fatigue  Stairs            Wheelchair Mobility    Modified Rankin (Stroke Patients Only)       Balance Overall balance assessment: Needs assistance Sitting-balance support: Feet supported;No upper extremity supported Sitting balance-Leahy Scale: Good Sitting balance - Comments: seated at EOB   Standing balance support: During functional activity;Bilateral upper extremity supported Standing balance-Leahy Scale: Fair Standing balance comment: using RW with fair carryover for NWB on RLE  Pertinent Vitals/Pain Pain Assessment: No/denies pain    Home Living Family/patient expects to be discharged to:: Private residence Living Arrangements: Spouse/significant other Available Help at Discharge: Family;Neighbor;Available PRN/intermittently Type of Home: Mobile home Home Access: Ramped  entrance     Home Layout: One level Home Equipment: Shower seat;Bedside commode;Walker - 4 wheels      Prior Function Level of Independence: Independent with assistive device(s)         Comments: household and short distanced community ambulator with Rollator     Hand Dominance   Dominant Hand: Right    Extremity/Trunk Assessment   Upper Extremity Assessment Upper Extremity Assessment: Generalized weakness    Lower Extremity Assessment Lower Extremity Assessment: Generalized weakness;RLE deficits/detail;LLE deficits/detail RLE Deficits / Details: grossly -4/5 except right foot/ankle not tested RLE: Unable to fully assess due to immobilization RLE Sensation: WNL RLE Coordination: WNL LLE Deficits / Details: grossly 4+/5 LLE Sensation: WNL LLE Coordination: WNL    Cervical / Trunk Assessment Cervical / Trunk Assessment: Normal  Communication   Communication: No difficulties  Cognition Arousal/Alertness: Awake/alert Behavior During Therapy: WFL for tasks assessed/performed Overall Cognitive Status: Within Functional Limits for tasks assessed                                        General Comments      Exercises     Assessment/Plan    PT Assessment Patient needs continued PT services  PT Problem List Decreased strength;Decreased activity tolerance;Decreased balance;Decreased mobility       PT Treatment Interventions Balance training;Gait training;Functional mobility training;Therapeutic activities;Therapeutic exercise;Patient/family education;Stair training    PT Goals (Current goals can be found in the Care Plan section)  Acute Rehab PT Goals Patient Stated Goal: return home with family to assist PT Goal Formulation: With patient Time For Goal Achievement: 09/15/19 Potential to Achieve Goals: Good    Frequency Min 3X/week   Barriers to discharge        Co-evaluation               AM-PAC PT "6 Clicks" Mobility  Outcome  Measure Help needed turning from your back to your side while in a flat bed without using bedrails?: None Help needed moving from lying on your back to sitting on the side of a flat bed without using bedrails?: None Help needed moving to and from a bed to a chair (including a wheelchair)?: A Lot Help needed standing up from a chair using your arms (e.g., wheelchair or bedside chair)?: A Little Help needed to walk in hospital room?: A Lot Help needed climbing 3-5 steps with a railing? : Total 6 Click Score: 16    End of Session   Activity Tolerance: Patient tolerated treatment well;Patient limited by fatigue Patient left: in chair;with call bell/phone within reach Nurse Communication: Mobility status PT Visit Diagnosis: Unsteadiness on feet (R26.81);Other abnormalities of gait and mobility (R26.89);Muscle weakness (generalized) (M62.81)    Time: [id number] PT Time Calculation (min) (ACUTE ONLY): 22 min   Charges:   PT Evaluation $PT Eval Moderate Complexity: 1 Mod PT Treatments $Therapeutic Activity: 8-22 mins        2:20 PM, 09/01/19 Cristian Yang, MPT Physical Therapist with [facility] [phone] office [phone] mobile phone

## 2019-09-01 NOTE — Plan of Care (Signed)
  Problem: Acute Rehab PT Goals(only PT should resolve) Goal: Pt Will Go Supine/Side To Sit Outcome: Progressing Flowsheets (Taken 09/01/2019 1422) Pt will go Supine/Side to Sit: Independently Goal: Patient Will Transfer Sit To/From Stand Outcome: Progressing Flowsheets (Taken 09/01/2019 1422) Patient will transfer sit to/from stand:  with min guard assist  with minimal assist Goal: Pt Will Transfer Bed To Chair/Chair To Bed Outcome: Progressing Flowsheets (Taken 09/01/2019 1422) Pt will Transfer Bed to Chair/Chair to Bed:  min guard assist  with min assist Goal: Pt Will Ambulate Outcome: Progressing Flowsheets (Taken 09/01/2019 1422) Pt will Ambulate:  25 feet  with minimal assist  with min guard assist  with rolling walker   2:23 PM, 09/01/19 Lonell Grandchild, MPT Physical Therapist with [facility] [phone] office [phone] mobile phone

## 2019-09-01 NOTE — TOC Transition Note (Signed)
Transition of Care [facility]) - CM/SW Discharge Note   Patient Details  Name: Cristian Yang MRN: [id number] Date of Birth: 09/14/1934  [facility] CM/SW Contact:  Shade Flood, LCSW Phone Number: 09/01/2019, 4:08 PM   Clinical Narrative:     Pt admitted from home. PT recommending SNF unless family can assist. Met with pt to discuss. He states that he does not want SNF and family will assist. Arranged HH with [facility] at pt request. Arranged all DME with [facility] and they will deliver to pt's home today yet. Updated pt's wife. She states that her brother will be picking pt up at the hospital.  Updated pt's RN. There are no other TOC needs for dc.  Expected Discharge Plan: [place] Barriers to Discharge: Barriers Resolved   Patient Goals and CMS Choice Patient states their goals for this hospitalization and ongoing recovery are:: return home CMS Medicare.gov Compare Post Acute Care list provided to:: Patient Choice offered to / list presented to : Patient  Expected Discharge Plan and Services Expected Discharge Plan: [place] In-house Referral: Clinical Social Work   Post Acute Care Choice: Durable Medical Equipment, Home Health Living arrangements for the past 2 months: [place] Expected Discharge Date: 09/01/19               DME Arranged: 3-N-1, Walker rolling, Oxygen, Wheelchair manual DME Agency: [employer] Date DME Agency Contacted: 09/01/19       [place] Agency: [place] ([place]) Date [place]: 09/01/19   Representative spoke with at [place]: Vaughan Basta  Prior Living Arrangements/Services Living arrangements for the past 2 months: [place] Lives with:: Spouse, Relatives Patient language and need for interpreter reviewed:: Yes Do you feel safe going back to the place where you live?: Yes      Need for Family Participation in Patient Care: Yes (Comment) Care  giver support system in place?: Yes (comment) Current home services: DME Criminal Activity/Legal Involvement Pertinent to Current Situation/Hospitalization: No - Comment as needed  Activities of Daily Living Home Assistive Devices/Equipment: None ADL Screening (condition at time of admission) Patient's cognitive ability adequate to safely complete daily activities?: Yes Is the patient deaf or have difficulty hearing?: Yes Does the patient have difficulty seeing, even when wearing glasses/contacts?: No Does the patient have difficulty concentrating, remembering, or making decisions?: No Patient able to express need for assistance with ADLs?: Yes Does the patient have difficulty dressing or bathing?: No Independently performs ADLs?: Yes (appropriate for developmental age) Does the patient have difficulty walking or climbing stairs?: Yes Weakness of Legs: Both Weakness of Arms/Hands: None  Permission Sought/Granted Permission sought to share information with : Chartered certified accountant granted to share information with : Yes, Verbal Permission Granted     Permission granted to share info w AGENCY: [employer], [facility]        Emotional Assessment Appearance:: Appears stated age Attitude/Demeanor/Rapport: Engaged Affect (typically observed): Pleasant Orientation: : Oriented to Self, Oriented to Place, Oriented to  Time, Oriented to Situation Alcohol / Substance Use: Not Applicable Psych Involvement: No (comment)  Admission diagnosis:  Vasovagal syncope [R55] Aspiration pneumonia ([place]) [J69.0] Bronchitis [J40] Hypoxia [R09.02] Unresponsive episode [R41.89] Closed fracture of right ankle, initial encounter [S82.891A] Aspiration pneumonia of both lungs, unspecified aspiration pneumonia type, unspecified part of lung ([place]) [J69.0] Patient Active Problem List   Diagnosis Date Noted  . Right fibular fracture 08/31/2019  .  Hypothyroidism 08/31/2019  .  Syncope, vasovagal 08/31/2019  . Aspiration pneumonia ([place]) 08/30/2019  . Aortic stenosis 12/09/2018  . Acute respiratory failure with hypoxia ([place]) 07/10/2018  . Right wrist fracture, closed, initial encounter   . COPD exacerbation ([place]) 07/09/2018  . Orthostatic hypotension 07/09/2018  . Closed distal radius fracture 07/09/18 07/09/2018  . Chronic back pain   . Pneumonia 11/27/2017  . Cough 11/26/2017  . Acute respiratory failure ([place]) 11/05/2017  . Medication refill 12/24/2016  . Right ankle pain 12/09/2016  . Ganglion cyst 09/21/2016  . Restless leg 09/21/2016  . Chronic obstructive pulmonary disease ([place]) 04/12/2015  . OSA on CPAP 06/02/2014  . GERD (gastroesophageal reflux disease) 06/02/2014  . Hyperlipidemia 06/02/2014  . Arthritis 06/02/2014  . Hypertension 06/02/2014   PCP:  Celene Squibb, MD Pharmacy:   [place], [place] - [address] [place] [address] Phone: [phone] Fax: [phone]     Social Determinants of Health (SDOH) Interventions    Readmission Risk Interventions Readmission Risk Prevention Plan 09/01/2019 07/11/2018  Medication Screening Complete -  Transportation Screening Complete Complete  PCP or Specialist Appt within 5-7 Days - Complete  Home Care Screening - Complete  Medication Review (RN CM) - Complete  Some recent data might be hidden    Final next level of care: Home w Home Health Services Barriers to Discharge: Barriers Resolved   Patient Goals and CMS Choice Patient states their goals for this hospitalization and ongoing recovery are:: return home CMS Medicare.gov Compare Post Acute Care list provided to:: Patient Choice offered to / list presented to : Patient  Discharge Placement                       Discharge Plan and Services In-house Referral: Clinical Social Work   Post Acute Care Choice: Durable Medical Equipment, Home Health          DME Arranged:  3-N-1, Walker rolling, Oxygen, Wheelchair manual DME Agency: [employer] Date DME Agency Contacted: 09/01/19       [place] Agency: [place] ([place]) Date [place]: 09/01/19   Representative spoke with at [place]: [place] ([place]) Interventions     Readmission Risk Interventions Readmission Risk Prevention Plan 09/01/2019 07/11/2018  Medication Screening Complete -  Transportation Screening Complete Complete  PCP or Specialist Appt within 5-7 Days - Complete  Home Care Screening - Complete  Medication Review (RN CM) - Complete  Some recent data might be hidden

## 2019-09-02 ENCOUNTER — Telehealth: Payer: Self-pay | Admitting: Orthopedic Surgery

## 2019-09-02 NOTE — Telephone Encounter (Signed)
Call received from patient and spouse; relays that patient has just come home from [facility] where he was in-patient from 08/29/19 to 09/01/19. Patient gave verbal okay to speak with his wife, who states he would like to schedule appointment with Dr Aline Brochure, said whose name appears "second" on the discharge summary.  Per review of notes, ortho on call at time of hospital care was Dr Veverly Fells. The orthopaedic surgeon's name who appears first is Dr Katha Hamming, orthopaedic trauma specialist.  Relayed to patient/wife that per discharge instructions and type of injury, Dr Doreatha Martin is the provider to contact. Upon calling back to patient to relay, reached voice mail; left message to return call.

## 2019-09-03 DIAGNOSIS — M1990 Unspecified osteoarthritis, unspecified site: Secondary | ICD-10-CM | POA: Diagnosis not present

## 2019-09-03 DIAGNOSIS — G8929 Other chronic pain: Secondary | ICD-10-CM | POA: Diagnosis not present

## 2019-09-03 DIAGNOSIS — E785 Hyperlipidemia, unspecified: Secondary | ICD-10-CM | POA: Diagnosis not present

## 2019-09-03 DIAGNOSIS — I10 Essential (primary) hypertension: Secondary | ICD-10-CM | POA: Diagnosis not present

## 2019-09-03 DIAGNOSIS — K219 Gastro-esophageal reflux disease without esophagitis: Secondary | ICD-10-CM | POA: Diagnosis not present

## 2019-09-03 DIAGNOSIS — J449 Chronic obstructive pulmonary disease, unspecified: Secondary | ICD-10-CM | POA: Diagnosis not present

## 2019-09-03 DIAGNOSIS — S82831D Other fracture of upper and lower end of right fibula, subsequent encounter for closed fracture with routine healing: Secondary | ICD-10-CM | POA: Diagnosis not present

## 2019-09-03 DIAGNOSIS — E039 Hypothyroidism, unspecified: Secondary | ICD-10-CM | POA: Diagnosis not present

## 2019-09-03 DIAGNOSIS — Z87891 Personal history of nicotine dependence: Secondary | ICD-10-CM | POA: Diagnosis not present

## 2019-09-03 DIAGNOSIS — F329 Major depressive disorder, single episode, unspecified: Secondary | ICD-10-CM | POA: Diagnosis not present

## 2019-09-03 DIAGNOSIS — Z993 Dependence on wheelchair: Secondary | ICD-10-CM | POA: Diagnosis not present

## 2019-09-03 DIAGNOSIS — G4733 Obstructive sleep apnea (adult) (pediatric): Secondary | ICD-10-CM | POA: Diagnosis not present

## 2019-09-03 DIAGNOSIS — J690 Pneumonitis due to inhalation of food and vomit: Secondary | ICD-10-CM | POA: Diagnosis not present

## 2019-09-03 LAB — LEGIONELLA PNEUMOPHILA SEROGP 1 UR AG: L. pneumophila Serogp 1 Ur Ag: NEGATIVE

## 2019-09-03 NOTE — Telephone Encounter (Signed)
I called, LM advising we wanted to make sure patient gets scheduled with an orthopedic surgeon, and to call us if we can help in any way.

## 2019-09-06 DIAGNOSIS — M1990 Unspecified osteoarthritis, unspecified site: Secondary | ICD-10-CM | POA: Diagnosis not present

## 2019-09-06 DIAGNOSIS — J690 Pneumonitis due to inhalation of food and vomit: Secondary | ICD-10-CM | POA: Diagnosis not present

## 2019-09-06 DIAGNOSIS — I10 Essential (primary) hypertension: Secondary | ICD-10-CM | POA: Diagnosis not present

## 2019-09-06 DIAGNOSIS — S82831D Other fracture of upper and lower end of right fibula, subsequent encounter for closed fracture with routine healing: Secondary | ICD-10-CM | POA: Diagnosis not present

## 2019-09-06 DIAGNOSIS — J449 Chronic obstructive pulmonary disease, unspecified: Secondary | ICD-10-CM | POA: Diagnosis not present

## 2019-09-06 DIAGNOSIS — G8929 Other chronic pain: Secondary | ICD-10-CM | POA: Diagnosis not present

## 2019-09-06 NOTE — Telephone Encounter (Signed)
I called back to patient's home phone#, approximately 8:35am; again left message.  Due to no response, called patient's daughter, left message.

## 2019-09-07 DIAGNOSIS — I10 Essential (primary) hypertension: Secondary | ICD-10-CM | POA: Diagnosis not present

## 2019-09-07 DIAGNOSIS — G8929 Other chronic pain: Secondary | ICD-10-CM | POA: Diagnosis not present

## 2019-09-07 DIAGNOSIS — S82831D Other fracture of upper and lower end of right fibula, subsequent encounter for closed fracture with routine healing: Secondary | ICD-10-CM | POA: Diagnosis not present

## 2019-09-07 DIAGNOSIS — M1990 Unspecified osteoarthritis, unspecified site: Secondary | ICD-10-CM | POA: Diagnosis not present

## 2019-09-07 DIAGNOSIS — J449 Chronic obstructive pulmonary disease, unspecified: Secondary | ICD-10-CM | POA: Diagnosis not present

## 2019-09-07 DIAGNOSIS — J690 Pneumonitis due to inhalation of food and vomit: Secondary | ICD-10-CM | POA: Diagnosis not present

## 2019-09-07 NOTE — Telephone Encounter (Signed)
Patient/spouse called back this afternoon; spoke with Abigail Butts. Requests to be seen in [place].  Scheduled appointment with Dr Aline Brochure as requested.

## 2019-09-10 ENCOUNTER — Ambulatory Visit: Payer: Medicare Other | Admitting: Orthopedic Surgery

## 2019-09-10 DIAGNOSIS — J449 Chronic obstructive pulmonary disease, unspecified: Secondary | ICD-10-CM | POA: Diagnosis not present

## 2019-09-10 DIAGNOSIS — I10 Essential (primary) hypertension: Secondary | ICD-10-CM | POA: Diagnosis not present

## 2019-09-10 DIAGNOSIS — S82831D Other fracture of upper and lower end of right fibula, subsequent encounter for closed fracture with routine healing: Secondary | ICD-10-CM | POA: Diagnosis not present

## 2019-09-10 DIAGNOSIS — G8929 Other chronic pain: Secondary | ICD-10-CM | POA: Diagnosis not present

## 2019-09-10 DIAGNOSIS — J690 Pneumonitis due to inhalation of food and vomit: Secondary | ICD-10-CM | POA: Diagnosis not present

## 2019-09-10 DIAGNOSIS — M1990 Unspecified osteoarthritis, unspecified site: Secondary | ICD-10-CM | POA: Diagnosis not present

## 2019-09-13 ENCOUNTER — Encounter: Payer: Self-pay | Admitting: Orthopedic Surgery

## 2019-09-14 DIAGNOSIS — J690 Pneumonitis due to inhalation of food and vomit: Secondary | ICD-10-CM | POA: Diagnosis not present

## 2019-09-14 DIAGNOSIS — S82831D Other fracture of upper and lower end of right fibula, subsequent encounter for closed fracture with routine healing: Secondary | ICD-10-CM | POA: Diagnosis not present

## 2019-09-14 DIAGNOSIS — G8929 Other chronic pain: Secondary | ICD-10-CM | POA: Diagnosis not present

## 2019-09-14 DIAGNOSIS — J449 Chronic obstructive pulmonary disease, unspecified: Secondary | ICD-10-CM | POA: Diagnosis not present

## 2019-09-14 DIAGNOSIS — I10 Essential (primary) hypertension: Secondary | ICD-10-CM | POA: Diagnosis not present

## 2019-09-14 DIAGNOSIS — M1990 Unspecified osteoarthritis, unspecified site: Secondary | ICD-10-CM | POA: Diagnosis not present

## 2019-09-16 DIAGNOSIS — M1990 Unspecified osteoarthritis, unspecified site: Secondary | ICD-10-CM | POA: Diagnosis not present

## 2019-09-16 DIAGNOSIS — I10 Essential (primary) hypertension: Secondary | ICD-10-CM | POA: Diagnosis not present

## 2019-09-16 DIAGNOSIS — J449 Chronic obstructive pulmonary disease, unspecified: Secondary | ICD-10-CM | POA: Diagnosis not present

## 2019-09-16 DIAGNOSIS — G8929 Other chronic pain: Secondary | ICD-10-CM | POA: Diagnosis not present

## 2019-09-16 DIAGNOSIS — J690 Pneumonitis due to inhalation of food and vomit: Secondary | ICD-10-CM | POA: Diagnosis not present

## 2019-09-16 DIAGNOSIS — S82831D Other fracture of upper and lower end of right fibula, subsequent encounter for closed fracture with routine healing: Secondary | ICD-10-CM | POA: Diagnosis not present

## 2019-09-20 DIAGNOSIS — G8929 Other chronic pain: Secondary | ICD-10-CM | POA: Diagnosis not present

## 2019-09-20 DIAGNOSIS — J690 Pneumonitis due to inhalation of food and vomit: Secondary | ICD-10-CM | POA: Diagnosis not present

## 2019-09-20 DIAGNOSIS — M1990 Unspecified osteoarthritis, unspecified site: Secondary | ICD-10-CM | POA: Diagnosis not present

## 2019-09-20 DIAGNOSIS — J449 Chronic obstructive pulmonary disease, unspecified: Secondary | ICD-10-CM | POA: Diagnosis not present

## 2019-09-20 DIAGNOSIS — I10 Essential (primary) hypertension: Secondary | ICD-10-CM | POA: Diagnosis not present

## 2019-09-20 DIAGNOSIS — S82831D Other fracture of upper and lower end of right fibula, subsequent encounter for closed fracture with routine healing: Secondary | ICD-10-CM | POA: Diagnosis not present

## 2019-09-21 ENCOUNTER — Ambulatory Visit: Payer: Medicare Other

## 2019-09-21 ENCOUNTER — Ambulatory Visit (INDEPENDENT_AMBULATORY_CARE_PROVIDER_SITE_OTHER): Payer: Medicare Other | Admitting: Orthopedic Surgery

## 2019-09-21 ENCOUNTER — Other Ambulatory Visit: Payer: Self-pay

## 2019-09-21 ENCOUNTER — Encounter: Payer: Self-pay | Admitting: Orthopedic Surgery

## 2019-09-21 VITALS — BP 139/72 | HR 60 | Ht 68.0 in | Wt 225.0 lb

## 2019-09-21 DIAGNOSIS — W000XXA Fall on same level due to ice and snow, initial encounter: Secondary | ICD-10-CM

## 2019-09-21 DIAGNOSIS — S82891A Other fracture of right lower leg, initial encounter for closed fracture: Secondary | ICD-10-CM | POA: Diagnosis not present

## 2019-09-21 DIAGNOSIS — I2699 Other pulmonary embolism without acute cor pulmonale: Secondary | ICD-10-CM | POA: Insufficient documentation
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
## 2019-09-21 NOTE — Patient Instructions (Signed)
Weight bearing as tolerated   Ok to remove the brace at night

## 2019-09-22 DIAGNOSIS — M1990 Unspecified osteoarthritis, unspecified site: Secondary | ICD-10-CM | POA: Diagnosis not present

## 2019-09-22 DIAGNOSIS — G8929 Other chronic pain: Secondary | ICD-10-CM | POA: Diagnosis not present

## 2019-09-22 DIAGNOSIS — J449 Chronic obstructive pulmonary disease, unspecified: Secondary | ICD-10-CM | POA: Diagnosis not present

## 2019-09-22 DIAGNOSIS — J690 Pneumonitis due to inhalation of food and vomit: Secondary | ICD-10-CM | POA: Diagnosis not present

## 2019-09-22 DIAGNOSIS — I10 Essential (primary) hypertension: Secondary | ICD-10-CM | POA: Diagnosis not present

## 2019-09-22 DIAGNOSIS — S82831D Other fracture of upper and lower end of right fibula, subsequent encounter for closed fracture with routine healing: Secondary | ICD-10-CM | POA: Diagnosis not present

## 2019-09-27 DIAGNOSIS — I10 Essential (primary) hypertension: Secondary | ICD-10-CM | POA: Diagnosis not present

## 2019-09-27 DIAGNOSIS — G8929 Other chronic pain: Secondary | ICD-10-CM | POA: Diagnosis not present

## 2019-09-27 DIAGNOSIS — M1990 Unspecified osteoarthritis, unspecified site: Secondary | ICD-10-CM | POA: Diagnosis not present

## 2019-09-27 DIAGNOSIS — J449 Chronic obstructive pulmonary disease, unspecified: Secondary | ICD-10-CM | POA: Diagnosis not present

## 2019-09-27 DIAGNOSIS — J690 Pneumonitis due to inhalation of food and vomit: Secondary | ICD-10-CM | POA: Diagnosis not present

## 2019-09-27 DIAGNOSIS — S82831D Other fracture of upper and lower end of right fibula, subsequent encounter for closed fracture with routine healing: Secondary | ICD-10-CM | POA: Diagnosis not present

## 2019-09-30 ENCOUNTER — Telehealth: Payer: Self-pay | Admitting: Pulmonary Disease

## 2019-09-30 NOTE — Telephone Encounter (Signed)
LMTC x 1 to discuss Trelegy refill ( Needs f/u appt with Dr Halford Chessman or App)

## 2019-10-01 NOTE — Telephone Encounter (Signed)
ATC Retha, no answer. Left message for her to call back.

## 2019-10-03 DIAGNOSIS — I10 Essential (primary) hypertension: Secondary | ICD-10-CM | POA: Diagnosis not present

## 2019-10-03 DIAGNOSIS — M1990 Unspecified osteoarthritis, unspecified site: Secondary | ICD-10-CM | POA: Diagnosis not present

## 2019-10-03 DIAGNOSIS — G4733 Obstructive sleep apnea (adult) (pediatric): Secondary | ICD-10-CM | POA: Diagnosis not present

## 2019-10-03 DIAGNOSIS — F329 Major depressive disorder, single episode, unspecified: Secondary | ICD-10-CM | POA: Diagnosis not present

## 2019-10-03 DIAGNOSIS — Z87891 Personal history of nicotine dependence: Secondary | ICD-10-CM | POA: Diagnosis not present

## 2019-10-03 DIAGNOSIS — J449 Chronic obstructive pulmonary disease, unspecified: Secondary | ICD-10-CM | POA: Diagnosis not present

## 2019-10-03 DIAGNOSIS — K219 Gastro-esophageal reflux disease without esophagitis: Secondary | ICD-10-CM | POA: Diagnosis not present

## 2019-10-03 DIAGNOSIS — S82831D Other fracture of upper and lower end of right fibula, subsequent encounter for closed fracture with routine healing: Secondary | ICD-10-CM | POA: Diagnosis not present

## 2019-10-03 DIAGNOSIS — J690 Pneumonitis due to inhalation of food and vomit: Secondary | ICD-10-CM | POA: Diagnosis not present

## 2019-10-03 DIAGNOSIS — E785 Hyperlipidemia, unspecified: Secondary | ICD-10-CM | POA: Diagnosis not present

## 2019-10-03 DIAGNOSIS — E039 Hypothyroidism, unspecified: Secondary | ICD-10-CM | POA: Diagnosis not present

## 2019-10-03 DIAGNOSIS — G8929 Other chronic pain: Secondary | ICD-10-CM | POA: Diagnosis not present

## 2019-10-03 DIAGNOSIS — Z993 Dependence on wheelchair: Secondary | ICD-10-CM | POA: Diagnosis not present

## 2019-10-04 DIAGNOSIS — M1990 Unspecified osteoarthritis, unspecified site: Secondary | ICD-10-CM | POA: Diagnosis not present

## 2019-10-04 DIAGNOSIS — I10 Essential (primary) hypertension: Secondary | ICD-10-CM | POA: Diagnosis not present

## 2019-10-04 DIAGNOSIS — G8929 Other chronic pain: Secondary | ICD-10-CM | POA: Diagnosis not present

## 2019-10-04 DIAGNOSIS — J449 Chronic obstructive pulmonary disease, unspecified: Secondary | ICD-10-CM | POA: Diagnosis not present

## 2019-10-04 DIAGNOSIS — S82831D Other fracture of upper and lower end of right fibula, subsequent encounter for closed fracture with routine healing: Secondary | ICD-10-CM | POA: Diagnosis not present

## 2019-10-04 DIAGNOSIS — J690 Pneumonitis due to inhalation of food and vomit: Secondary | ICD-10-CM | POA: Diagnosis not present

## 2019-10-04 NOTE — Telephone Encounter (Signed)
LMTCB x3 for pt. We have attempted to contact pt several times with no success or call back from pt. Per triage protocol, message will be closed.   

## 2019-10-06 DIAGNOSIS — I10 Essential (primary) hypertension: Secondary | ICD-10-CM | POA: Diagnosis not present

## 2019-10-06 DIAGNOSIS — S82831D Other fracture of upper and lower end of right fibula, subsequent encounter for closed fracture with routine healing: Secondary | ICD-10-CM | POA: Diagnosis not present

## 2019-10-06 DIAGNOSIS — G8929 Other chronic pain: Secondary | ICD-10-CM | POA: Diagnosis not present

## 2019-10-06 DIAGNOSIS — J449 Chronic obstructive pulmonary disease, unspecified: Secondary | ICD-10-CM | POA: Diagnosis not present

## 2019-10-06 DIAGNOSIS — J690 Pneumonitis due to inhalation of food and vomit: Secondary | ICD-10-CM | POA: Diagnosis not present

## 2019-10-06 DIAGNOSIS — M1990 Unspecified osteoarthritis, unspecified site: Secondary | ICD-10-CM | POA: Diagnosis not present

## 2019-10-08 ENCOUNTER — Telehealth: Payer: Self-pay | Admitting: Pulmonary Disease

## 2019-10-08 MED ORDER — TRELEGY ELLIPTA 100-62.5-25 MCG/INH IN AEPB: 1.0000 | INHALATION_SPRAY | Freq: Every day | RESPIRATORY_TRACT | 3 refills | Status: DC

## 2019-10-08 NOTE — Telephone Encounter (Signed)
Refill of trelegy has been sent to pt's preferred pharmacy. Called and spoke with pt's wife Emerson Monte letting her know this had been done and she verbalized understanding. Nothing further needed.

## 2019-10-10 NOTE — Progress Notes (Signed)
Virtual Visit via Telephone Note  I connected with Cristian Yang on 10/11/19 at  3:00 PM EDT by telephone and verified that I am speaking with the correct person using two identifiers.  Location: Patient: Home Provider: [facility] - [id number], [place], [place] [address]   I discussed the limitations, risks, security and privacy concerns of performing an evaluation and management service by telephone and the availability of in person appointments. I also discussed with the patient that there may be a patient responsible charge related to this service. The patient expressed understanding and agreed to proceed.  Patient consented to consult via telephone: Yes People present and their role in pt care: Pt    History of Present Illness:  85 year old male former smoker followed in our office for COPD, obstructive sleep apnea and respiratory failure  Past medical history: GERD, hyperlipidemia, hypertension, hypothyroidism, history of PE Smoking history: Former smoker.  Quit 2005.  60-pack-year smoking history. Maintenance: Trelegy Ellipta Patient of Dr. Halford Chessman  Chief complaint: Med refill - Trelegy Ellipta   85 year old male former smoker followed in our office for COPD and respiratory failure.  Patient completing televisit with our office today needing a refill of Trelegy Ellipta.  He has no active complaints or issues with his breathing.  He does need a refill of Trelegy Ellipta.  Using CPAP no issues   Patient recently broke his ankle.  He is working with orthopedics on this.  Recently hospitalized with pneumonia.  Observations/Objective:  Pulmonary tests:  PFT 12/12/14 >> FEV1 1.61 (60%), FEV1% 66, TLC 5.48 (82%), DLCO 47%, no BD  Sleep tests:  PSG 09/29/02 >> AHI 12, SaO2 low 85%  Cardiac tests:  Echo 11/09/17 >> mild LVH, EF 55 to 60%, mild AS   Social History   Tobacco Use  Smoking Status Former Smoker  . Packs/day: 2.00  . Years: 30.00  .  Pack years: 60.00  . Types: Cigarettes  . Quit date: 07/23/2003  . Years since quitting: 16.2  Smokeless Tobacco Never Used   Immunization History  Administered Date(s) Administered  . Influenza Split 04/21/2014  . Influenza, High Dose Seasonal PF 04/08/2018  . Influenza,inj,Quad PF,6+ Mos 04/21/2016  . Influenza-Unspecified 03/27/2015  . Pneumococcal Conjugate-13 12/23/2016  . Tdap 10/05/2015      Assessment and Plan:  OSA on CPAP Plan:  Continue CPAP   Chronic obstructive pulmonary disease (HCC) Plan:  Continue Trelegy Ellipta    Follow Up Instructions:  Return in about 3 months (around 01/11/2020), or if symptoms worsen or fail to improve, for Follow up with Dr. Halford Chessman.   I discussed the assessment and treatment plan with the patient. The patient was provided an opportunity to ask questions and all were answered. The patient agreed with the plan and demonstrated an understanding of the instructions.   The patient was advised to call back or seek an in-person evaluation if the symptoms worsen or if the condition fails to improve as anticipated.  I provided 15 minutes of non-face-to-face time during this encounter.   Lauraine Rinne, NP

## 2019-10-11 ENCOUNTER — Ambulatory Visit (INDEPENDENT_AMBULATORY_CARE_PROVIDER_SITE_OTHER): Payer: Medicare Other | Admitting: Pulmonary Disease

## 2019-10-11 ENCOUNTER — Encounter: Payer: Self-pay | Admitting: Pulmonary Disease

## 2019-10-11 ENCOUNTER — Other Ambulatory Visit: Payer: Self-pay

## 2019-10-11 DIAGNOSIS — J449 Chronic obstructive pulmonary disease, unspecified: Secondary | ICD-10-CM | POA: Diagnosis not present

## 2019-10-11 DIAGNOSIS — J439 Emphysema, unspecified: Secondary | ICD-10-CM | POA: Diagnosis not present

## 2019-10-11 DIAGNOSIS — Z9989 Dependence on other enabling machines and devices: Secondary | ICD-10-CM | POA: Diagnosis not present

## 2019-10-11 DIAGNOSIS — G4733 Obstructive sleep apnea (adult) (pediatric): Secondary | ICD-10-CM | POA: Diagnosis not present

## 2019-10-11 MED ORDER — TRELEGY ELLIPTA 100-62.5-25 MCG/INH IN AEPB: 1.0000 | INHALATION_SPRAY | Freq: Every day | RESPIRATORY_TRACT | 3 refills | Status: DC

## 2019-10-11 NOTE — Assessment & Plan Note (Signed)
Plan: Continue Trelegy Ellipta 

## 2019-10-11 NOTE — Patient Instructions (Signed)
You were seen today by Lauraine Rinne, NP  for:   Nice talking with you today. I have refilled your inhaler. We will see you back in 3 months.   Take care and stay safe,   Cristian Yang   1. COPD with chronic bronchitis and emphysema ([place])  - Fluticasone-Umeclidin-Vilant (TRELEGY ELLIPTA) 100-62.5-25 MCG/INH AEPB; Inhale 1 puff into the lungs daily.  Dispense: 60 each; Refill: 3  Trelegy Ellipta  >>> 1 puff daily in the morning >>>rinse mouth out after use  >>> This inhaler contains 3 medications that help manage her respiratory status, contact our office if you cannot afford this medication or unable to remain on this medication  Only use your albuterol as a rescue medication to be used if you can't catch your breath by resting or doing a relaxed purse lip breathing pattern.  - The less you use it, the better it will work when you need it. - Ok to use up to 2 puffs  every 4 hours if you must but call for immediate appointment if use goes up over your usual need - Don't leave home without it !!  (think of it like the spare tire for your car)   Note your daily symptoms > remember "red flags" for COPD:   >>>Increase in cough >>>increase in sputum production >>>increase in shortness of breath or activity  intolerance.   If you notice these symptoms, please call the office to be seen.    2. OSA on CPAP  We recommend that you continue using your CPAP daily >>>Keep up the hard work using your device >>> Goal should be wearing this for the entire night that you are sleeping, at least 4 to 6 hours  Remember:  . Do not drive or operate heavy machinery if tired or drowsy.  . Please notify the supply company and office if you are unable to use your device regularly due to missing supplies or machine being broken.  . Work on maintaining a healthy weight and following your recommended nutrition plan  . Maintain proper daily exercise and movement  . Maintaining proper use of your device can also  help improve management of other chronic illnesses such as: Blood pressure, blood sugars, and weight management.   BiPAP/ CPAP Cleaning:  >>>Clean weekly, with Dawn soap, and bottle brush.  Set up to air dry. >>> Wipe mask out daily with wet wipe or towelette     We recommend today:  No orders of the defined types were placed in this encounter.  No orders of the defined types were placed in this encounter.  Meds ordered this encounter  Medications  . Fluticasone-Umeclidin-Vilant (TRELEGY ELLIPTA) 100-62.5-25 MCG/INH AEPB    Sig: Inhale 1 puff into the lungs daily.    Dispense:  60 each    Refill:  3    Must have an appointment for future refills.    Follow Up:    Return in about 3 months (around 01/11/2020), or if symptoms worsen or fail to improve, for Follow up with Dr. Halford Chessman.   Please do your part to reduce the spread of COVID-19:      Reduce your risk of any infection  and COVID19 by using the similar precautions used for avoiding the common cold or flu:  Marland Kitchen Wash your hands often with soap and warm water for at least 20 seconds.  If soap and water are not readily available, use an alcohol-based hand sanitizer with at least 60% alcohol.  Marland Kitchen  If coughing or sneezing, cover your mouth and nose by coughing or sneezing into the elbow areas of your shirt or coat, into a tissue or into your sleeve (not your hands). Langley Gauss A MASK when in public  . Avoid shaking hands with others and consider head nods or verbal greetings only. . Avoid touching your eyes, nose, or mouth with unwashed hands.  . Avoid close contact with people who are sick. . Avoid places or events with large numbers of people in one location, like concerts or sporting events. . If you have some symptoms but not all symptoms, continue to monitor at home and seek medical attention if your symptoms worsen. . If you are having a medical emergency, call 911.   [place] / e-Visit: [email or web address]         [facility]: [facility]: [id number]                   [facility]: [id number]     It is flu season:   >>> Best ways to protect herself from the flu: Receive the yearly flu vaccine, practice good hand hygiene washing with soap and also using hand sanitizer when available, eat a nutritious meals, get adequate rest, hydrate appropriately   Please contact the office if your symptoms worsen or you have concerns that you are not improving.   Thank you for choosing [facility] for your healthcare, and for allowing [place] to partner with you on your healthcare journey. I am thankful to be able to provide care to you today.   Wyn Quaker FNP-C

## 2019-10-11 NOTE — Assessment & Plan Note (Signed)
Plan: Continue CPAP 

## 2019-10-12 DIAGNOSIS — J449 Chronic obstructive pulmonary disease, unspecified: Secondary | ICD-10-CM | POA: Diagnosis not present

## 2019-10-12 DIAGNOSIS — M1990 Unspecified osteoarthritis, unspecified site: Secondary | ICD-10-CM | POA: Diagnosis not present

## 2019-10-12 DIAGNOSIS — J690 Pneumonitis due to inhalation of food and vomit: Secondary | ICD-10-CM | POA: Diagnosis not present

## 2019-10-12 DIAGNOSIS — I10 Essential (primary) hypertension: Secondary | ICD-10-CM | POA: Diagnosis not present

## 2019-10-12 DIAGNOSIS — S82831D Other fracture of upper and lower end of right fibula, subsequent encounter for closed fracture with routine healing: Secondary | ICD-10-CM | POA: Diagnosis not present

## 2019-10-12 DIAGNOSIS — G8929 Other chronic pain: Secondary | ICD-10-CM | POA: Diagnosis not present

## 2019-10-12 NOTE — Progress Notes (Signed)
Reviewed and agree with assessment/plan.   Cassidee Deats, MD [facility] 07/17/2016, 12:24 PM Pager:  [phone]  

## 2019-10-20 DIAGNOSIS — G8929 Other chronic pain: Secondary | ICD-10-CM | POA: Diagnosis not present

## 2019-10-20 DIAGNOSIS — S82831D Other fracture of upper and lower end of right fibula, subsequent encounter for closed fracture with routine healing: Secondary | ICD-10-CM | POA: Diagnosis not present

## 2019-10-20 DIAGNOSIS — M1990 Unspecified osteoarthritis, unspecified site: Secondary | ICD-10-CM | POA: Diagnosis not present

## 2019-10-20 DIAGNOSIS — J449 Chronic obstructive pulmonary disease, unspecified: Secondary | ICD-10-CM | POA: Diagnosis not present

## 2019-10-20 DIAGNOSIS — I10 Essential (primary) hypertension: Secondary | ICD-10-CM | POA: Diagnosis not present

## 2019-10-20 DIAGNOSIS — J690 Pneumonitis due to inhalation of food and vomit: Secondary | ICD-10-CM | POA: Diagnosis not present

## 2019-10-22 DIAGNOSIS — M1990 Unspecified osteoarthritis, unspecified site: Secondary | ICD-10-CM | POA: Diagnosis not present

## 2019-10-22 DIAGNOSIS — I10 Essential (primary) hypertension: Secondary | ICD-10-CM | POA: Diagnosis not present

## 2019-10-22 DIAGNOSIS — J449 Chronic obstructive pulmonary disease, unspecified: Secondary | ICD-10-CM | POA: Diagnosis not present

## 2019-10-22 DIAGNOSIS — S82831D Other fracture of upper and lower end of right fibula, subsequent encounter for closed fracture with routine healing: Secondary | ICD-10-CM | POA: Diagnosis not present

## 2019-10-22 DIAGNOSIS — G8929 Other chronic pain: Secondary | ICD-10-CM | POA: Diagnosis not present

## 2019-10-22 DIAGNOSIS — J690 Pneumonitis due to inhalation of food and vomit: Secondary | ICD-10-CM | POA: Diagnosis not present

## 2019-10-27 ENCOUNTER — Ambulatory Visit: Payer: Medicare Other

## 2019-10-27 ENCOUNTER — Other Ambulatory Visit: Payer: Self-pay

## 2019-10-27 ENCOUNTER — Ambulatory Visit (INDEPENDENT_AMBULATORY_CARE_PROVIDER_SITE_OTHER): Payer: Medicare Other | Admitting: Orthopedic Surgery

## 2019-10-27 VITALS — Temp 96.8°F

## 2019-10-27 DIAGNOSIS — S82891D Other fracture of right lower leg, subsequent encounter for closed fracture with routine healing: Secondary | ICD-10-CM | POA: Diagnosis not present

## 2019-10-27 DIAGNOSIS — S82831D Other fracture of upper and lower end of right fibula, subsequent encounter for closed fracture with routine healing: Secondary | ICD-10-CM | POA: Diagnosis not present

## 2019-10-27 DIAGNOSIS — J449 Chronic obstructive pulmonary disease, unspecified: Secondary | ICD-10-CM | POA: Diagnosis not present

## 2019-10-27 DIAGNOSIS — I10 Essential (primary) hypertension: Secondary | ICD-10-CM | POA: Diagnosis not present

## 2019-10-27 DIAGNOSIS — G8929 Other chronic pain: Secondary | ICD-10-CM | POA: Diagnosis not present

## 2019-10-27 DIAGNOSIS — J690 Pneumonitis due to inhalation of food and vomit: Secondary | ICD-10-CM | POA: Diagnosis not present

## 2019-10-27 DIAGNOSIS — M1990 Unspecified osteoarthritis, unspecified site: Secondary | ICD-10-CM | POA: Diagnosis not present
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
## 2019-10-28 DIAGNOSIS — M1990 Unspecified osteoarthritis, unspecified site: Secondary | ICD-10-CM | POA: Diagnosis not present

## 2019-10-28 DIAGNOSIS — J449 Chronic obstructive pulmonary disease, unspecified: Secondary | ICD-10-CM | POA: Diagnosis not present

## 2019-10-28 DIAGNOSIS — S82831D Other fracture of upper and lower end of right fibula, subsequent encounter for closed fracture with routine healing: Secondary | ICD-10-CM | POA: Diagnosis not present

## 2019-10-28 DIAGNOSIS — J690 Pneumonitis due to inhalation of food and vomit: Secondary | ICD-10-CM | POA: Diagnosis not present

## 2019-10-28 DIAGNOSIS — G8929 Other chronic pain: Secondary | ICD-10-CM | POA: Diagnosis not present

## 2019-10-28 DIAGNOSIS — I10 Essential (primary) hypertension: Secondary | ICD-10-CM | POA: Diagnosis not present

## 2019-11-01 DIAGNOSIS — J449 Chronic obstructive pulmonary disease, unspecified: Secondary | ICD-10-CM | POA: Diagnosis not present

## 2019-11-01 DIAGNOSIS — S82831D Other fracture of upper and lower end of right fibula, subsequent encounter for closed fracture with routine healing: Secondary | ICD-10-CM | POA: Diagnosis not present

## 2019-11-01 DIAGNOSIS — G8929 Other chronic pain: Secondary | ICD-10-CM | POA: Diagnosis not present

## 2019-11-01 DIAGNOSIS — J690 Pneumonitis due to inhalation of food and vomit: Secondary | ICD-10-CM | POA: Diagnosis not present

## 2019-11-01 DIAGNOSIS — M1990 Unspecified osteoarthritis, unspecified site: Secondary | ICD-10-CM | POA: Diagnosis not present

## 2019-11-01 DIAGNOSIS — I10 Essential (primary) hypertension: Secondary | ICD-10-CM | POA: Diagnosis not present

## 2019-11-23 ENCOUNTER — Other Ambulatory Visit: Payer: Self-pay | Admitting: Pulmonary Disease

## 2019-11-23 DIAGNOSIS — D485 Neoplasm of uncertain behavior of skin: Secondary | ICD-10-CM | POA: Diagnosis not present

## 2019-11-23 DIAGNOSIS — L820 Inflamed seborrheic keratosis: Secondary | ICD-10-CM | POA: Diagnosis not present

## 2019-11-23 DIAGNOSIS — B079 Viral wart, unspecified: Secondary | ICD-10-CM | POA: Diagnosis not present

## 2019-12-29 DIAGNOSIS — I10 Essential (primary) hypertension: Secondary | ICD-10-CM | POA: Diagnosis not present

## 2019-12-29 DIAGNOSIS — G894 Chronic pain syndrome: Secondary | ICD-10-CM | POA: Diagnosis not present

## 2019-12-29 DIAGNOSIS — G4733 Obstructive sleep apnea (adult) (pediatric): Secondary | ICD-10-CM | POA: Diagnosis not present

## 2019-12-29 DIAGNOSIS — E039 Hypothyroidism, unspecified: Secondary | ICD-10-CM | POA: Diagnosis not present

## 2019-12-29 DIAGNOSIS — J060 Acute laryngopharyngitis: Secondary | ICD-10-CM | POA: Diagnosis not present

## 2019-12-29 DIAGNOSIS — F331 Major depressive disorder, recurrent, moderate: Secondary | ICD-10-CM | POA: Diagnosis not present

## 2019-12-29 DIAGNOSIS — F329 Major depressive disorder, single episode, unspecified: Secondary | ICD-10-CM | POA: Diagnosis not present

## 2019-12-29 DIAGNOSIS — J449 Chronic obstructive pulmonary disease, unspecified: Secondary | ICD-10-CM | POA: Diagnosis not present

## 2019-12-29 DIAGNOSIS — E785 Hyperlipidemia, unspecified: Secondary | ICD-10-CM | POA: Diagnosis not present

## 2019-12-29 DIAGNOSIS — J441 Chronic obstructive pulmonary disease with (acute) exacerbation: Secondary | ICD-10-CM | POA: Diagnosis not present

## 2019-12-29 DIAGNOSIS — J302 Other seasonal allergic rhinitis: Secondary | ICD-10-CM | POA: Diagnosis not present

## 2019-12-29 DIAGNOSIS — G8929 Other chronic pain: Secondary | ICD-10-CM | POA: Diagnosis not present

## 2020-02-07 ENCOUNTER — Ambulatory Visit: Payer: Medicare Other | Admitting: Pulmonary Disease

## 2020-04-06 ENCOUNTER — Ambulatory Visit (INDEPENDENT_AMBULATORY_CARE_PROVIDER_SITE_OTHER): Payer: Medicare Other | Admitting: Pulmonary Disease

## 2020-04-06 ENCOUNTER — Encounter: Payer: Self-pay | Admitting: Pulmonary Disease

## 2020-04-06 ENCOUNTER — Other Ambulatory Visit: Payer: Self-pay

## 2020-04-06 VITALS — BP 130/70 | HR 55 | Temp 97.1°F | Ht 68.0 in | Wt 221.0 lb

## 2020-04-06 DIAGNOSIS — J439 Emphysema, unspecified: Secondary | ICD-10-CM

## 2020-04-06 DIAGNOSIS — G4733 Obstructive sleep apnea (adult) (pediatric): Secondary | ICD-10-CM | POA: Diagnosis not present

## 2020-04-06 DIAGNOSIS — J449 Chronic obstructive pulmonary disease, unspecified: Secondary | ICD-10-CM | POA: Diagnosis not present

## 2020-04-06 DIAGNOSIS — R05 Cough: Secondary | ICD-10-CM | POA: Diagnosis not present

## 2020-04-06 DIAGNOSIS — R058 Other specified cough: Secondary | ICD-10-CM

## 2020-04-06 MED ORDER — TRELEGY ELLIPTA 100-62.5-25 MCG/INH IN AEPB: 1.0000 | INHALATION_SPRAY | Freq: Every day | RESPIRATORY_TRACT | 3 refills | Status: DC

## 2020-04-06 MED ORDER — ALBUTEROL SULFATE (2.5 MG/3ML) 0.083% IN NEBU: 2.5000 mg | INHALATION_SOLUTION | Freq: Four times a day (QID) | RESPIRATORY_TRACT | 5 refills | Status: DC | PRN

## 2020-04-06 MED ORDER — MONTELUKAST SODIUM 10 MG PO TABS: 10.0000 mg | ORAL_TABLET | Freq: Every day | ORAL | 3 refills | Status: AC

## 2020-04-06 NOTE — Patient Instructions (Signed)
Will arrange for new CPAP set up  Follow up in 6 months 

## 2020-04-06 NOTE — Progress Notes (Signed)
[facility]  Chief Complaint  Patient presents with  . Follow-up    no complaints    Constitutional:  BP 130/70 (BP Location: Left Arm, Cuff Size: Normal)   Pulse (!) 55   Temp (!) 97.1 F (36.2 C) (Other (Comment)) Comment (Src): wrist  Ht 5\' 8"  (1.727 m)   Wt 221 lb (100.2 kg)   SpO2 95% Comment: room air  BMI 33.60 kg/m   Past Medical History:  Chronic back pain, Depression, GERD, HLD, HTN, Hypothyroidism, Hiatal hernia  Past Surgical History:  His  has a past surgical history that includes Skin cancer excision (2013); Prostate surgery; Neck surgery; skin cancer removed; Cataract extraction w/PHACO (Left, 02/13/2016); and Cataract extraction w/PHACO (Right, 05/28/2019).  Brief Summary:  Cristian Yang is a 85 y.o. 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      Subjective:   He was in hospital in February with aspiration pneumonia.  COVID was negative.  He feels he has recovered.  He isn't have much cough, wheeze, or sputum.  He uses CPAP nightly.  Has been using oxygen at night with CPAP since he was in hospital.  His machine is very old and he isn't sure if he is getting enough pressure from the machine.   Physical Exam:   Appearance - well kempt   ENMT - no sinus tenderness, no oral exudate, no LAN, Mallampati 3 airway, no stridor  Respiratory - equal breath sounds bilaterally, no wheezing or rales  CV - s1s2 regular rate and rhythm, no murmurs  Ext - no clubbing, no edema  Skin - no rashes  Psych - normal mood and affect   Pulmonary testing:   PFT 12/12/14 >> FEV1 1.61 (60%), FEV1% 66, TLC 5.48 (82%), DLCO 47%, no BD  Chest Imaging:   CT angio chest 08/30/19 >> hiatal hernia, patchy and tree in bud nodularity in LUL, b/l lower lobe consolidation  Sleep Tests:   PSG 09/29/02 >> AHI 12, SaO2 low 85%  Cardiac Tests:   Echo 11/09/17 >> mild LVH, EF 55 to 60%, mild AS  Social  History:  He  reports that he quit smoking about 16 years ago. His smoking use included cigarettes. He has a 60.00 pack-year smoking history. He has never used smokeless tobacco. He reports that he does not drink alcohol and does not use drugs.  Family History:  His family history includes Asthma in his father; Bone cancer in his sister; Diabetes in his mother; Heart disease in his mother; Liver cancer in his brother.     Assessment/Plan:   COPD with emphysemaand asthma. - continue trelegy and singulair - prn albuterol  Obstructive sleep apnea. - he is compliant with CPAP - he uses [facility] for his DME - his machine is more than 83 yrs old, not functioning appropriately and not amenable to repair - will arrange for new auto CPAP machine  Upper airway cough with post-nasal drip. - continue nasonex, singulair - prn zyrtec  COVID 19 advice. - reviewed benefits for COVID vaccination - he plans to get Pfizer vaccine  Time Spent Involved in Patient Care on Day of Examination:  34 minutes  Follow up:  Patient Instructions  Will arrange for new CPAP set up  Follow up in 6 months   Medication List:   Allergies as of 04/06/2020      Reactions   Ciprofloxacin Nausea And Vomiting   Codeine Nausea And  Vomiting      Medication List       Accurate as of April 06, 2020  1:55 PM. If you have any questions, ask your nurse or doctor.        STOP taking these medications   GNP Melatonin 3 MG Tabs tablet Generic drug: melatonin Stopped by: Chesley Mires, MD   predniSONE 20 MG tablet Commonly known as: Deltasone Stopped by: Chesley Mires, MD     TAKE these medications   albuterol (2.5 MG/3ML) 0.083% nebulizer solution Commonly known as: PROVENTIL Take 3 mLs (2.5 mg total) by nebulization every 6 (six) hours as needed for wheezing or shortness of breath. Started by: Chesley Mires, MD   aspirin 81 MG EC tablet Take 1 tablet (81 mg total) by mouth daily with breakfast.     citalopram 20 MG tablet Commonly known as: CELEXA Take 1 tablet (20 mg total) by mouth at bedtime.   docusate sodium 100 MG capsule Commonly known as: COLACE TAKE 1 CAPSULE BY MOUTH IN THE MORNING What changed: See the new instructions.   gabapentin 300 MG capsule Commonly known as: NEURONTIN TAKE 1 CAPSULE BY MOUTH IN THE MORNING TAKE 2 CAPSULE IN THE EVENING What changed:   how much to take  how to take this  when to take this   guaiFENesin 600 MG 12 hr tablet Commonly known as: MUCINEX Take 1 tablet (600 mg total) by mouth 2 (two) times daily.   HYDROcodone-acetaminophen 5-325 MG tablet Commonly known as: NORCO/VICODIN Take 1 tablet by mouth every 6 (six) hours as needed for moderate pain or severe pain.   levothyroxine 75 MCG tablet Commonly known as: SYNTHROID Take 1 tablet (75 mcg total) by mouth daily before breakfast.   lidocaine 5 % ointment Commonly known as: XYLOCAINE Apply 1 application topically 3 (three) times daily as needed. Apply to affected area   losartan 50 MG tablet Commonly known as: COZAAR Take 1 tablet (50 mg total) by mouth at bedtime.   mometasone 50 MCG/ACT nasal spray Commonly known as: NASONEX SPRAY 2 SPRAYS INTO EACH NOSTRIL ONCE DAILY   montelukast 10 MG tablet Commonly known as: SINGULAIR Take 1 tablet (10 mg total) by mouth at bedtime.   multivitamin Tabs tablet Take 1 tablet by mouth every morning.   omeprazole 20 MG capsule Commonly known as: PRILOSEC Take 1 capsule (20 mg total) by mouth every morning.   Trelegy Ellipta 100-62.5-25 MCG/INH Aepb Generic drug: Fluticasone-Umeclidin-Vilant Inhale 1 puff into the lungs daily.   Tums Ultra 1000 400 MG chewable tablet Generic drug: calcium elemental as carbonate Chew 3,000 mg by mouth at bedtime.       Signature:  Chesley Mires, MD [place] Pager - [phone] 04/06/2020, 1:55 PM

## 2020-04-21 DIAGNOSIS — Z23 Encounter for immunization: Secondary | ICD-10-CM | POA: Diagnosis not present

## 2020-05-12 ENCOUNTER — Other Ambulatory Visit (HOSPITAL_COMMUNITY): Payer: Self-pay | Admitting: Adult Health Nurse Practitioner

## 2020-05-12 ENCOUNTER — Ambulatory Visit (HOSPITAL_COMMUNITY)
Admission: RE | Admit: 2020-05-12 | Discharge: 2020-05-12 | Disposition: A | Discharge status: Home / Self Care | Payer: Medicare Other | Source: Ambulatory Visit | Attending: Adult Health Nurse Practitioner | Admitting: Adult Health Nurse Practitioner

## 2020-05-12 ENCOUNTER — Other Ambulatory Visit: Payer: Self-pay

## 2020-05-12 DIAGNOSIS — R52 Pain, unspecified: Secondary | ICD-10-CM | POA: Insufficient documentation

## 2020-05-12 DIAGNOSIS — M25511 Pain in right shoulder: Secondary | ICD-10-CM | POA: Diagnosis not present

## 2020-05-12 DIAGNOSIS — M542 Cervicalgia: Secondary | ICD-10-CM | POA: Insufficient documentation

## 2020-05-26 ENCOUNTER — Telehealth: Payer: Self-pay | Admitting: Pulmonary Disease

## 2020-05-26 NOTE — Telephone Encounter (Signed)
Sent community message to [facility] for they to check on what happen with order placed on 04/06/20

## 2020-05-31 DIAGNOSIS — Z23 Encounter for immunization: Secondary | ICD-10-CM | POA: Diagnosis not present

## 2020-05-31 DIAGNOSIS — M25511 Pain in right shoulder: Secondary | ICD-10-CM | POA: Diagnosis not present

## 2020-05-31 DIAGNOSIS — I10 Essential (primary) hypertension: Secondary | ICD-10-CM | POA: Diagnosis not present

## 2020-05-31 DIAGNOSIS — K219 Gastro-esophageal reflux disease without esophagitis: Secondary | ICD-10-CM | POA: Diagnosis not present

## 2020-05-31 DIAGNOSIS — S51812A Laceration without foreign body of left forearm, initial encounter: Secondary | ICD-10-CM | POA: Diagnosis not present

## 2020-05-31 DIAGNOSIS — J302 Other seasonal allergic rhinitis: Secondary | ICD-10-CM | POA: Diagnosis not present

## 2020-05-31 DIAGNOSIS — W19XXXA Unspecified fall, initial encounter: Secondary | ICD-10-CM | POA: Diagnosis not present

## 2020-05-31 DIAGNOSIS — J690 Pneumonitis due to inhalation of food and vomit: Secondary | ICD-10-CM | POA: Diagnosis not present

## 2020-05-31 DIAGNOSIS — F329 Major depressive disorder, single episode, unspecified: Secondary | ICD-10-CM | POA: Diagnosis not present

## 2020-05-31 DIAGNOSIS — J060 Acute laryngopharyngitis: Secondary | ICD-10-CM | POA: Diagnosis not present

## 2020-05-31 DIAGNOSIS — E039 Hypothyroidism, unspecified: Secondary | ICD-10-CM | POA: Diagnosis not present

## 2020-05-31 DIAGNOSIS — J441 Chronic obstructive pulmonary disease with (acute) exacerbation: Secondary | ICD-10-CM | POA: Diagnosis not present

## 2020-05-31 DIAGNOSIS — G894 Chronic pain syndrome: Secondary | ICD-10-CM | POA: Diagnosis not present

## 2020-05-31 DIAGNOSIS — K5900 Constipation, unspecified: Secondary | ICD-10-CM | POA: Diagnosis not present

## 2020-05-31 DIAGNOSIS — E785 Hyperlipidemia, unspecified: Secondary | ICD-10-CM | POA: Diagnosis not present

## 2020-06-01 NOTE — Telephone Encounter (Signed)
I just spoke with Cristian Yang because I sent a message through [place] on 11/5 and had not heard back from [facility] yet. Cristian Yang stated the order was received on 04/20/20 and was sent to the [facility] department.  The Rep[facility] department left message for patient on 05/12/20 and patient returned their call on 05/26/20. Jeannette sent back a message to the [facility] department on 11/5. Again today Cristian Yang was sending an urgent message back to the [place] to call the patient again.  I have also left a message for the patient to call the office about these facts

## 2020-06-05 DIAGNOSIS — S51812A Laceration without foreign body of left forearm, initial encounter: Secondary | ICD-10-CM | POA: Diagnosis not present

## 2020-06-05 NOTE — Telephone Encounter (Signed)
Called and spoke with pt letting him know the info stated by Rodena Piety and he verbalized understanding.

## 2020-06-05 NOTE — Telephone Encounter (Signed)
Patient is returning phone call. Patient phone number is [phone].

## 2020-06-06 ENCOUNTER — Telehealth: Payer: Self-pay | Admitting: Internal Medicine

## 2020-06-06 NOTE — Telephone Encounter (Signed)
That's fine, happy to see them closer to home   Zandra Abts MD

## 2020-06-06 NOTE — Telephone Encounter (Signed)
Fine with me.  Dr Debara Pickett

## 2020-06-06 NOTE — Telephone Encounter (Signed)
Per wife, patient needs to switch from Dr. Debara Pickett to Dr. Harl Bowie. She has recently done this switch as well.

## 2020-06-12 DIAGNOSIS — G4733 Obstructive sleep apnea (adult) (pediatric): Secondary | ICD-10-CM | POA: Diagnosis not present

## 2020-06-12 DIAGNOSIS — Z6834 Body mass index (BMI) 34.0-34.9, adult: Secondary | ICD-10-CM | POA: Diagnosis not present

## 2020-06-12 DIAGNOSIS — F331 Major depressive disorder, recurrent, moderate: Secondary | ICD-10-CM | POA: Diagnosis not present

## 2020-06-12 DIAGNOSIS — I10 Essential (primary) hypertension: Secondary | ICD-10-CM | POA: Diagnosis not present

## 2020-06-12 DIAGNOSIS — M150 Primary generalized (osteo)arthritis: Secondary | ICD-10-CM | POA: Diagnosis not present

## 2020-06-12 DIAGNOSIS — G894 Chronic pain syndrome: Secondary | ICD-10-CM | POA: Diagnosis not present

## 2020-06-12 DIAGNOSIS — J449 Chronic obstructive pulmonary disease, unspecified: Secondary | ICD-10-CM | POA: Diagnosis not present

## 2020-06-12 DIAGNOSIS — M545 Low back pain, unspecified: Secondary | ICD-10-CM | POA: Diagnosis not present

## 2020-06-12 DIAGNOSIS — E6609 Other obesity due to excess calories: Secondary | ICD-10-CM | POA: Diagnosis not present

## 2020-06-12 DIAGNOSIS — J302 Other seasonal allergic rhinitis: Secondary | ICD-10-CM | POA: Diagnosis not present

## 2020-06-12 DIAGNOSIS — E039 Hypothyroidism, unspecified: Secondary | ICD-10-CM | POA: Diagnosis not present

## 2020-06-12 DIAGNOSIS — K219 Gastro-esophageal reflux disease without esophagitis: Secondary | ICD-10-CM | POA: Diagnosis not present

## 2020-06-23 DIAGNOSIS — J069 Acute upper respiratory infection, unspecified: Secondary | ICD-10-CM | POA: Diagnosis not present

## 2020-07-17 ENCOUNTER — Telehealth: Payer: Self-pay | Admitting: Pulmonary Disease

## 2020-07-17 ENCOUNTER — Other Ambulatory Visit: Payer: Self-pay | Admitting: Pulmonary Disease

## 2020-07-17 NOTE — Telephone Encounter (Signed)
ATC pt, no answer and no VM set up.   rx pended in this encounter- need to verify pharmacy.  Thanks!

## 2020-07-24 NOTE — Telephone Encounter (Signed)
ATC X2, no answer and no vm set up.  Wcb.

## 2020-08-11 ENCOUNTER — Ambulatory Visit: Payer: Medicare Other | Admitting: Cardiology

## 2020-08-11 MED ORDER — MOMETASONE FUROATE 50 MCG/ACT NA SUSP: 2.0000 | Freq: Every day | NASAL | 0 refills | Status: DC

## 2020-08-13 NOTE — Progress Notes (Signed)
Cardiology Office Note  Date: 08/14/2020   ID: IMRAAN Yang, DOB [birth year], MRN [id number]  PCP:  Celene Squibb, MD  Cardiologist:  No primary care provider on file. Electrophysiologist:  None   Chief Complaint: follow up HTN, HLD, aortic stenosis  History of Present Illness: Cristian Yang is a 86 y.o. male with a history of HTN, COPD, GERD, HLD, OSA on CPAP, hypothyroidism, arthritis.  Hx of non obstructive CAD, [place] , [place] 2014. With mild AS with EF 55-60% 2019. Previously ill with septic shock 2019. Previous admission for fall and fracture 2019.  Saw Dr Debara Pickett via telemedicine 12/09/2018. No SOB/CP, exercise intolerance, palpitations, near syncopal episodes. Dyslipidemia was controlled on pravastatin. BP controlled on current meds. Cardiac Catheterization showed nonosbtructive disease.  He is here to follow-up today.  Apparently he wanted to switch providers to be closer to where he lives.  He denies any recent anginal symptoms.  He does have some mild dyspnea on exertion.  He has a history of mild aortic stenosis on echo back in 2019.  Denies any palpitations or arrhythmias, orthostatic symptoms, CVA or TIA-like symptoms, PND, orthopnea, bleeding issues.  Blood pressure is elevated today but he states he has not taken his antihypertensive medications this morning.  He denies any claudication-like symptoms, DVT or PE-like symptoms.  States he does have some occasional mild lower extremity edema which she attributes to arthritis in his ankles.  He uses CPAP at home and is compliant.  States he has an invalid wife at home who helps take care of along with a brother-in-law who is living with him.  He states all of his recent lab work at primary care office was within normal limits.   Past Medical History:  Diagnosis Date  . Arthritis   . Asthma   . Chronic back pain   . COPD (chronic obstructive pulmonary disease) ([place])   . Depression   . GERD (gastroesophageal reflux disease)    . Hyperlipidemia   . Hypertension   . Hypothyroidism   . OSA on CPAP   . Skin cancer   . Sleep apnea    uses CPAP nightly    Past Surgical History:  Procedure Laterality Date  . CATARACT EXTRACTION W/PHACO Left 02/13/2016   Procedure: CATARACT EXTRACTION PHACO AND INTRAOCULAR LENS PLACEMENT (IOC);  Surgeon: Rutherford Guys, MD;  Location: [facility];  Service: Ophthalmology;  Laterality: Left;  CDE: 9.23  . CATARACT EXTRACTION W/PHACO Right 05/28/2019   Procedure: CATARACT EXTRACTION PHACO AND INTRAOCULAR LENS PLACEMENT (IOC);  Surgeon: Baruch Goldmann, MD;  Location: [facility];  Service: Ophthalmology;  Laterality: Right;  CDE: 9.51  . NECK SURGERY     cervical disc  . PROSTATE SURGERY     TURP, laser  . SKIN CANCER EXCISION  2013  . skin cancer removed     x3    Current Outpatient Medications  Medication Sig Dispense Refill  . albuterol (PROVENTIL) (2.5 MG/3ML) 0.083% nebulizer solution Take 3 mLs (2.5 mg total) by nebulization every 6 (six) hours as needed for wheezing or shortness of breath. 75 mL 5  . amLODipine (NORVASC) 2.5 MG tablet Take 2.5 mg by mouth daily.    Marland Kitchen aspirin EC 81 MG EC tablet Take 1 tablet (81 mg total) by mouth daily with breakfast. 30 tablet 1  . calcium elemental as carbonate (BARIATRIC TUMS ULTRA) 400 MG chewable tablet Chew 3,000 mg by mouth at bedtime.    . citalopram (CELEXA) 20 MG tablet  Take 1 tablet (20 mg total) by mouth at bedtime. 30 tablet 3  . docusate sodium (COLACE) 100 MG capsule TAKE 1 CAPSULE BY MOUTH IN THE MORNING (Patient taking differently: Take 100 mg by mouth daily.) 28 capsule 0  . Fluticasone-Umeclidin-Vilant (TRELEGY ELLIPTA) 100-62.5-25 MCG/INH AEPB Inhale 1 puff into the lungs daily. 60 each 3  . gabapentin (NEURONTIN) 300 MG capsule TAKE 1 CAPSULE BY MOUTH IN THE MORNING TAKE 2 CAPSULE IN THE EVENING (Patient taking differently: Take 300 mg by mouth 3 (three) times daily. TAKE 1 CAPSULE BY MOUTH IN THE MORNING TAKE 2 CAPSULE IN THE  EVENING) 84 capsule 0  . guaiFENesin (MUCINEX) 600 MG 12 hr tablet Take 1 tablet (600 mg total) by mouth 2 (two) times daily. 20 tablet 0  . HYDROcodone-acetaminophen (NORCO/VICODIN) 5-325 MG tablet Take 1 tablet by mouth every 6 (six) hours as needed for moderate pain or severe pain. 15 tablet 0  . levothyroxine (SYNTHROID, LEVOTHROID) 75 MCG tablet Take 1 tablet (75 mcg total) by mouth daily before breakfast. 30 tablet 3  . lidocaine (XYLOCAINE) 5 % ointment Apply 1 application topically 3 (three) times daily as needed. Apply to affected area 35.44 g 1  . losartan (COZAAR) 50 MG tablet Take 1 tablet (50 mg total) by mouth at bedtime. 30 tablet 2  . meloxicam (MOBIC) 15 MG tablet Take 15 mg by mouth daily.    . methocarbamol (ROBAXIN) 750 MG tablet Take 750-1,500 mg by mouth 3 (three) times daily as needed for muscle spasms.    . mometasone (NASONEX) 50 MCG/ACT nasal spray SPRAY 2 SPRAYS INTO EACH NOSTRIL ONCE DAILY 51 g 0  . mometasone (NASONEX) 50 MCG/ACT nasal spray Place 2 sprays into the nose daily. 51 g 0  . montelukast (SINGULAIR) 10 MG tablet Take 1 tablet (10 mg total) by mouth at bedtime. 30 tablet 3  . multivitamin (ONE-A-DAY MEN'S) TABS tablet Take 1 tablet by mouth every morning. 30 tablet 11  . omeprazole (PRILOSEC) 20 MG capsule Take 1 capsule (20 mg total) by mouth every morning. 30 capsule 3   No current facility-administered medications for this visit.   Allergies:  Ciprofloxacin and Codeine   Social History: The patient  reports that he quit smoking about 17 years ago. His smoking use included cigarettes. He has a 60.00 pack-year smoking history. He has never used smokeless tobacco. He reports that he does not drink alcohol and does not use drugs.   Family History: [name] family history includes Asthma in his father; Bone cancer in his sister; Diabetes in his mother; Heart disease in his mother; Liver cancer in his brother.   ROS:  Please see the history of present  illness. Otherwise, complete review of systems is positive for none.  All other systems are reviewed and negative.   Physical Exam: VS:  BP (!) 150/82   Pulse (!) 56   Resp 16   Ht 5\' 8"  (1.727 m)   Wt 223 lb 12.8 oz (101.5 kg)   SpO2 98%   BMI 34.03 kg/m , BMI Body mass index is 34.03 kg/m.  Wt Readings from Last 3 Encounters:  08/14/20 223 lb 12.8 oz (101.5 kg)  04/06/20 221 lb (100.2 kg)  09/21/19 225 lb (102.1 kg)    General: Patient appears comfortable at rest. Neck: Supple, no elevated JVP or carotid bruits, no thyromegaly. Lungs: Clear to auscultation, nonlabored breathing at rest. Cardiac: Regular rate and rhythm, no S3 or significant systolic murmur, no  pericardial rub. Extremities: No pitting edema, distal pulses 2+. Skin: Warm and dry. Musculoskeletal: No kyphosis. Neuropsychiatric: Alert and oriented x3, affect grossly appropriate.  ECG:  An ECG dated 08/14/2020 was personally reviewed today and demonstrated:  Sinus bradycardia with first-degree AV block rate 56, right bundle branch block, left anterior fascicular block/bifascicular block  Recent Labwork: 08/30/2019: B Natriuretic Peptide 45.0 08/31/2019: ALT 14; AST 20 09/01/2019: BUN 16; Creatinine, Ser 0.69; Hemoglobin 11.8; Platelets 229; Potassium 4.2; Sodium 133     Component Value Date/Time   CHOL 174 10/21/2017 1448   TRIG 172 (H) 10/21/2017 1448   HDL 80 10/21/2017 1448   CHOLHDL 2.2 10/21/2017 1448   LDLCALC 60 10/21/2017 1448    Other Studies Reviewed Today:  Echocardiogram 11/09/2017 Study Conclusions   - Left ventricle: The cavity size was normal. Wall thickness was  increased in a pattern of mild LVH. Systolic function was normal.  The estimated ejection fraction was in the range of 55% to 60%.  Wall motion was normal; there were no regional wall motion  abnormalities. Left ventricular diastolic function parameters  were normal.  - Aortic valve: There was mild stenosis. Valve area  (VTI): 1.7  cm^2. Valve area (Vmax): 1.56 cm^2. Valve area (Vmean): 1.76  cm^2.  - Left atrium: The atrium was mildly dilated.  - Atrial septum: No defect or patent foramen ovale was identified.   Assessment and Plan:  1. Aortic valve stenosis, etiology of cardiac valve disease unspecified   2. Essential hypertension   3. Mixed hyperlipidemia    1. Aortic valve stenosis, etiology of cardiac valve disease unspecified Previous evidence of aortic valve stenosis on echo 2019.  He does have a mild systolic murmur best heard at right upper sternal border.  States he has some mild DOE.  He sees pulmonary Dr. Halford Chessman for his COPD/OSA.  Please repeat echocardiogram to reevaluate valvular function.  2. Essential hypertension Blood pressure elevated today at 150/82.  Patient states she has not taken any of his antihypertensive medications today.  Continue amlodipine 2.5 mg daily.  Continue losartan 50 mg daily.    Medication Adjustments/Labs and Tests Ordered: Current medicines are reviewed at length with the patient today.  Concerns regarding medicines are outlined above.   Disposition: Follow-up with [facility] 6 months  Signed, Levell July, NP 08/14/2020 9:31 AM    [facility], [place], [place] [address] Phone: [phone]; Fax: [phone]

## 2020-08-14 ENCOUNTER — Ambulatory Visit (INDEPENDENT_AMBULATORY_CARE_PROVIDER_SITE_OTHER): Payer: Medicare Other | Admitting: Family Medicine

## 2020-08-14 ENCOUNTER — Other Ambulatory Visit: Payer: Self-pay

## 2020-08-14 ENCOUNTER — Encounter: Payer: Self-pay | Admitting: Family Medicine

## 2020-08-14 VITALS — BP 150/82 | HR 56 | Resp 16 | Ht 68.0 in | Wt 223.8 lb

## 2020-08-14 DIAGNOSIS — I350 Nonrheumatic aortic (valve) stenosis: Secondary | ICD-10-CM | POA: Diagnosis not present

## 2020-08-14 DIAGNOSIS — I10 Essential (primary) hypertension: Secondary | ICD-10-CM | POA: Diagnosis not present

## 2020-08-14 DIAGNOSIS — E782 Mixed hyperlipidemia: Secondary | ICD-10-CM

## 2020-08-14 NOTE — Patient Instructions (Signed)

## 2020-08-29 ENCOUNTER — Ambulatory Visit (INDEPENDENT_AMBULATORY_CARE_PROVIDER_SITE_OTHER): Payer: Medicare Other

## 2020-08-29 DIAGNOSIS — I350 Nonrheumatic aortic (valve) stenosis: Secondary | ICD-10-CM | POA: Diagnosis not present

## 2020-08-29 LAB — ECHOCARDIOGRAM COMPLETE
AR max vel: 2.15 cm2
AV Area VTI: 2.27 cm2
AV Area mean vel: 2.14 cm2
AV Mean grad: 9.2 mmHg
AV Peak grad: 18.9 mmHg
Ao pk vel: 2.17 m/s
Area-P 1/2: 2.42 cm2
Calc EF: 62.6 %
MV M vel: 4.59 m/s
MV Peak grad: 84.3 mmHg
P 1/2 time: 3362 msec
S' Lateral: 3.09 cm
Single Plane A2C EF: 60.7 %
Single Plane A4C EF: 63.4 %

## 2020-09-07 ENCOUNTER — Telehealth: Payer: Self-pay | Admitting: *Deleted

## 2020-09-07 NOTE — Telephone Encounter (Signed)
-----   Message from [name], NP sent at 08/30/2020  7:48 PM EST ----- Please call the patient and let him know the pumping function of his heart looks good. The main pumping chamber is more muscular and stiff than normal. Best way to manage is to manage blood pressure to keep it at or below 130/80 and manage other risk factors. has two minor leaking valves. This is common with aging. He only has mild narrowing of the aortic valve

## 2020-09-07 NOTE — Telephone Encounter (Signed)
Laurine Blazer, LPN  6/72/5500 1:64 PM EST Back to Top     Full mailbox.

## 2020-09-14 NOTE — Telephone Encounter (Signed)
Laurine Blazer, LPN  12/13/9100 8:90 PM EST Back to Top     Notified, copy to pcp.

## 2020-09-15 DIAGNOSIS — G8929 Other chronic pain: Secondary | ICD-10-CM | POA: Diagnosis not present

## 2020-09-15 DIAGNOSIS — L853 Xerosis cutis: Secondary | ICD-10-CM | POA: Diagnosis not present

## 2020-10-25 ENCOUNTER — Telehealth: Payer: Self-pay | Admitting: Pulmonary Disease

## 2020-10-25 NOTE — Telephone Encounter (Signed)
Attempted to call patient's wife. Left message for her to give the office a call back.

## 2020-10-26 NOTE — Telephone Encounter (Signed)
Lmtcb for pt's wife.  

## 2020-10-31 NOTE — Telephone Encounter (Signed)
Left detailed message for pt's wife, regarding the national shortage of CPAPs and to call back if needed. Will close encounter due to several unsuccessful attempts to reach pt, per triage protocol.

## 2020-11-02 ENCOUNTER — Telehealth: Payer: Self-pay

## 2020-11-02 NOTE — Telephone Encounter (Signed)
Juliann Pulse from [facility] called needing patient last office note faxed to [facility]. Patient last office visit noted to be on 04/06/2020 and is outside of 6 months and patient will need office visit in order to get cpap. Noted in chart that original order was faxed for a new cpap back on 04/06/2020 and there are no new orders and this cpap order was from september. Juliann Pulse from [facility] is going to look in to this further and will call back if needed.

## 2020-11-08 DIAGNOSIS — J302 Other seasonal allergic rhinitis: Secondary | ICD-10-CM | POA: Insufficient documentation

## 2020-11-08 DIAGNOSIS — E039 Hypothyroidism, unspecified: Secondary | ICD-10-CM | POA: Insufficient documentation

## 2020-11-08 DIAGNOSIS — E782 Mixed hyperlipidemia: Secondary | ICD-10-CM | POA: Diagnosis not present

## 2020-11-08 DIAGNOSIS — K219 Gastro-esophageal reflux disease without esophagitis: Secondary | ICD-10-CM | POA: Insufficient documentation

## 2020-11-08 DIAGNOSIS — I10 Essential (primary) hypertension: Secondary | ICD-10-CM | POA: Diagnosis not present

## 2020-11-08 DIAGNOSIS — R21 Rash and other nonspecific skin eruption: Secondary | ICD-10-CM | POA: Insufficient documentation

## 2020-12-15 ENCOUNTER — Telehealth: Payer: Self-pay | Admitting: Pulmonary Disease

## 2020-12-15 NOTE — Telephone Encounter (Signed)
Called [facility] and spoke with Juliann Pulse.  Cpap order was placed 04/06/20 by Dr. Halford Chessman at [facility].  OV notes have been faxed and received by [facility]. Juliann Pulse stated she would reach out to repap department to see what the hold up is on Patient receiving cpap. Cpap recall and back order has out receiving cpap's behind schedule, but Patient's may have been missed or overlooked.  Juliann Pulse stated [facility] would call Patient with updates and process of cpap.   ATC Patient and left messages to call back

## 2020-12-27 NOTE — Telephone Encounter (Signed)
Called and left detailed message for pt and spouse to call back if they havent heard from [facility] or have any other questions or concerns. Will close encounter.

## 2021-02-11 NOTE — Progress Notes (Signed)
Cardiology Office Note  Date: 02/12/2021   ID: Cristian Yang, DOB 05/01/1935, MRN [id number]  PCP:  Celene Squibb, MD  Cardiologist:  None Electrophysiologist:  None   Chief Complaint: follow up HTN, HLD, aortic stenosis  History of Present Illness: Cristian Yang is a 86 y.o. male with a history of HTN, COPD, GERD, HLD, OSA on CPAP, hypothyroidism, arthritis.  Hx of non obstructive CAD, [place] , MD 2014. With mild AS with EF 55-60% 2019. Previously ill with septic shock 2019. Previous admission for fall and fracture 2019.  Saw Dr Debara Pickett via telemedicine 12/09/2018. No SOB/CP, exercise intolerance, palpitations, near syncopal episodes. Dyslipidemia was controlled on pravastatin. BP controlled on current meds. Cardiac Catheterization showed nonosbtructive disease.  He is here today for 32-monthfollow-up.  He denies any recent anginal symptoms, DOE, SOB, palpitations or arrhythmias, CVA or TIA-like symptoms.  Denies any PND, orthopnea.  Denies any bleeding issues.  No claudication-like symptoms, DVT or PE-like symptoms, or lower extremity edema.  Blood pressure is elevated today at 140/72.  He states recent visit with PCP his blood pressure was within normal limits.  He had an echocardiogram in February of this year with evidence of mild aortic stenosis.  He states his wife who was a patient here recently passed away from heart failure.  He states he had been taking care of her for quite some time until her passing.    Past Medical History:  Diagnosis Date   Arthritis    Asthma    Chronic back pain    COPD (chronic obstructive pulmonary disease) (HCC)    Depression    GERD (gastroesophageal reflux disease)    Hyperlipidemia    Hypertension    Hypothyroidism    OSA on CPAP    Skin cancer    Sleep apnea    uses CPAP nightly    Past Surgical History:  Procedure Laterality Date   CATARACT EXTRACTION W/PHACO Left 02/13/2016   Procedure: CATARACT EXTRACTION PHACO AND INTRAOCULAR  LENS PLACEMENT ([facility];  Surgeon: MRutherford Guys MD;  Location: [facility];  Service: Ophthalmology;  Laterality: Left;  CDE: 9.23   CATARACT EXTRACTION W/PHACO Right 05/28/2019   Procedure: CATARACT EXTRACTION PHACO AND INTRAOCULAR LENS PLACEMENT (IOC);  Surgeon: WBaruch Goldmann MD;  Location: [facility];  Service: Ophthalmology;  Laterality: Right;  CDE: 9.51   NECK SURGERY     cervical disc   PROSTATE SURGERY     TURP, laser   SKIN CANCER EXCISION  2013   skin cancer removed     x3    Current Outpatient Medications  Medication Sig Dispense Refill   albuterol (PROVENTIL) (2.5 MG/3ML) 0.083% nebulizer solution Take 3 mLs (2.5 mg total) by nebulization every 6 (six) hours as needed for wheezing or shortness of breath. 75 mL 5   amLODipine (NORVASC) 2.5 MG tablet Take 2.5 mg by mouth daily.     aspirin EC 81 MG EC tablet Take 1 tablet (81 mg total) by mouth daily with breakfast. 30 tablet 1   calcium elemental as carbonate (BARIATRIC TUMS ULTRA) 400 MG chewable tablet Chew 3,000 mg by mouth at bedtime.     citalopram (CELEXA) 20 MG tablet Take 1 tablet (20 mg total) by mouth at bedtime. 30 tablet 3   docusate sodium (COLACE) 100 MG capsule TAKE 1 CAPSULE BY MOUTH IN THE MORNING (Patient taking differently: Take 100 mg by mouth daily.) 28 capsule 0   Fluticasone-Umeclidin-Vilant (TRELEGY ELLIPTA) 100-62.5-25 MCG/INH AEPB Inhale  1 puff into the lungs daily. 60 each 3   gabapentin (NEURONTIN) 300 MG capsule TAKE 1 CAPSULE BY MOUTH IN THE MORNING TAKE 2 CAPSULE IN THE EVENING (Patient taking differently: Take 300 mg by mouth 3 (three) times daily. TAKE 1 CAPSULE BY MOUTH IN THE MORNING TAKE 2 CAPSULE IN THE EVENING) 84 capsule 0   guaiFENesin (MUCINEX) 600 MG 12 hr tablet Take 1 tablet (600 mg total) by mouth 2 (two) times daily. 20 tablet 0   HYDROcodone-acetaminophen (NORCO/VICODIN) 5-325 MG tablet Take 1 tablet by mouth every 6 (six) hours as needed for moderate pain or severe pain. 15 tablet 0    levothyroxine (SYNTHROID, LEVOTHROID) 75 MCG tablet Take 1 tablet (75 mcg total) by mouth daily before breakfast. 30 tablet 3   lidocaine (XYLOCAINE) 5 % ointment Apply 1 application topically 3 (three) times daily as needed. Apply to affected area 35.44 g 1   losartan (COZAAR) 50 MG tablet Take 1 tablet (50 mg total) by mouth at bedtime. 30 tablet 2   meloxicam (MOBIC) 15 MG tablet Take 15 mg by mouth daily.     methocarbamol (ROBAXIN) 750 MG tablet Take 750-1,500 mg by mouth 3 (three) times daily as needed for muscle spasms.     mometasone (NASONEX) 50 MCG/ACT nasal spray Place 2 sprays into the nose daily. 51 g 0   montelukast (SINGULAIR) 10 MG tablet Take 1 tablet (10 mg total) by mouth at bedtime. 30 tablet 3   multivitamin (ONE-A-DAY MEN'S) TABS tablet Take 1 tablet by mouth every morning. 30 tablet 11   omeprazole (PRILOSEC) 20 MG capsule Take 1 capsule (20 mg total) by mouth every morning. 30 capsule 3   No current facility-administered medications for this visit.   Allergies:  Ciprofloxacin and Codeine   Social History: The patient  reports that he quit smoking about 17 years ago. His smoking use included cigarettes. He has a 60.00 pack-year smoking history. He has never used smokeless tobacco. He reports that he does not drink alcohol and does not use drugs.   Family History: [name] family history includes Asthma in his father; Bone cancer in his sister; Diabetes in his mother; Heart disease in his mother; Liver cancer in his brother.   ROS:  Please see the history of present illness. Otherwise, complete review of systems is positive for none.  All other systems are reviewed and negative.   Physical Exam: VS:  BP 140/72   Pulse (!) 57   Ht '5\' 8"'$  (1.727 m)   Wt 215 lb 3.2 oz (97.6 kg)   SpO2 95%   BMI 32.72 kg/m , BMI Body mass index is 32.72 kg/m.  Wt Readings from Last 3 Encounters:  02/12/21 215 lb 3.2 oz (97.6 kg)  08/14/20 223 lb 12.8 oz (101.5 kg)  04/06/20 221  lb (100.2 kg)    General: Patient appears comfortable at rest. Neck: Supple, no elevated JVP or carotid bruits, no thyromegaly. Lungs: Clear to auscultation, nonlabored breathing at rest. Cardiac: Regular rate and rhythm, no S3 or significant systolic murmur, no pericardial rub. Extremities: No pitting edema, distal pulses 2+. Skin: Warm and dry. Musculoskeletal: No kyphosis. Neuropsychiatric: Alert and oriented x3, affect grossly appropriate.  ECG:  An ECG dated 08/14/2020 was personally reviewed today and demonstrated:  Sinus bradycardia with first-degree AV block rate 56, right bundle branch block, left anterior fascicular block/bifascicular block  Recent Labwork: No results found for requested labs within last 8760 hours.  Component Value Date/Time   CHOL 174 10/21/2017 1448   TRIG 172 (H) 10/21/2017 1448   HDL 80 10/21/2017 1448   CHOLHDL 2.2 10/21/2017 1448   LDLCALC 60 10/21/2017 1448    Other Studies Reviewed Today:  Echocardiogram 08/29/2020 1. Left ventricular ejection fraction, by estimation, is 60 to 65%. The left ventricle has normal function. The left ventricle has no regional wall motion abnormalities. There is moderate left ventricular hypertrophy. Left ventricular diastolic parameters are consistent with Grade I diastolic dysfunction (impaired relaxation). 2. Right ventricular systolic function is normal. The right ventricular size is normal. There is normal pulmonary artery systolic pressure. The estimated right ventricular systolic pressure is [id number] mmHg. 3. Left atrial size was mildly dilated. 4. There is a trivial pericardial effusion posterior to the left ventricle. 5. The mitral valve is grossly normal. Trivial mitral valve regurgitation. 6. The aortic valve is tricuspid. Aortic valve regurgitation is trivial. Moderate aortic valve sclerosis to mild stenosis is present. Aortic regurgitation PHT measures 3362 msec. Aortic valve area, by VTI measures 2.27  cm. Aortic valve mean gradient measures 9.2 mmHg. 7. The inferior vena cava is normal in size with greater than 50% respiratory variability, suggesting right atrial pressure of 3 mmHg. Comparison(s): Echocardiogram done 11/09/17 showed an EF of 55-60% with mild AS and an AV Peak Grad of 33 mmHg.   Echocardiogram 11/09/2017 Study Conclusions   - Left ventricle: The cavity size was normal. Wall thickness was    increased in a pattern of mild LVH. Systolic function was normal.    The estimated ejection fraction was in the range of 55% to 60%.    Wall motion was normal; there were no regional wall motion    abnormalities. Left ventricular diastolic function parameters    were normal.  - Aortic valve: There was mild stenosis. Valve area (VTI): 1.7    cm^2. Valve area (Vmax): 1.56 cm^2. Valve area (Vmean): 1.76    cm^2.  - Left atrium: The atrium was mildly dilated.  - Atrial septum: No defect or patent foramen ovale was identified.   Assessment and Plan:  1. Aortic valve stenosis, etiology of cardiac valve disease unspecified   2. Essential hypertension     1. Aortic valve stenosis, etiology of cardiac valve disease unspecified Previous evidence of aortic valve stenosis on echo 2019.  Repeat echocardiogram on 08/29/2020 demonstrated EF of 60 to 65%.  No WMA's.  Moderate LVH, G1 DD, LA mildly dilated, trivial pericardial effusion posterior to the left ventricle.  Trivial MR, trivial AR, moderate aortic valve sclerosis to mild stenosis present.  He denies any dyspnea, lightheadedness/syncopal or near syncopal symptoms.  Denies any anginal symptoms.    2. Essential hypertension Blood pressure elevated today at BP today 140/72.  Patient states at recent PCP visit his blood pressure was normal.  Continue amlodipine 2.5 mg daily.  Continue losartan 50 mg daily.    Medication Adjustments/Labs and Tests Ordered: Current medicines are reviewed at length with the patient today.  Concerns regarding  medicines are outlined above.   Disposition: Follow-up with Dr. Harl Bowie APP 6 months  Signed, Levell July, NP 02/12/2021 12:06 PM    [facility] [place], [place], [place] [address] Phone: [phone]; Fax: [phone]

## 2021-02-12 ENCOUNTER — Encounter: Payer: Self-pay | Admitting: Family Medicine

## 2021-02-12 ENCOUNTER — Ambulatory Visit (INDEPENDENT_AMBULATORY_CARE_PROVIDER_SITE_OTHER): Payer: Medicare Other | Admitting: Family Medicine

## 2021-02-12 ENCOUNTER — Other Ambulatory Visit: Payer: Self-pay

## 2021-02-12 VITALS — BP 140/72 | HR 57 | Ht 68.0 in | Wt 215.2 lb

## 2021-02-12 DIAGNOSIS — I10 Essential (primary) hypertension: Secondary | ICD-10-CM

## 2021-02-12 DIAGNOSIS — I350 Nonrheumatic aortic (valve) stenosis: Secondary | ICD-10-CM | POA: Diagnosis not present

## 2021-02-12 NOTE — Patient Instructions (Addendum)
Medication Instructions:  Continue all current medications.   Labwork: none  Testing/Procedures: none  Follow-Up: 6 months   Any Other Special Instructions Will Be Listed Below (If Applicable).   If you need a refill on your cardiac medications before your next appointment, please call your pharmacy.  

## 2021-02-21 ENCOUNTER — Other Ambulatory Visit: Payer: Self-pay

## 2021-02-21 ENCOUNTER — Ambulatory Visit (INDEPENDENT_AMBULATORY_CARE_PROVIDER_SITE_OTHER): Payer: Medicare Other | Admitting: Pulmonary Disease

## 2021-02-21 ENCOUNTER — Encounter: Payer: Self-pay | Admitting: Pulmonary Disease

## 2021-02-21 VITALS — BP 150/80 | HR 62 | Temp 96.7°F | Ht 68.0 in | Wt 215.0 lb

## 2021-02-21 DIAGNOSIS — G4733 Obstructive sleep apnea (adult) (pediatric): Secondary | ICD-10-CM

## 2021-02-21 DIAGNOSIS — J449 Chronic obstructive pulmonary disease, unspecified: Secondary | ICD-10-CM | POA: Diagnosis not present

## 2021-02-21 MED ORDER — TRELEGY ELLIPTA 100-62.5-25 MCG/INH IN AEPB
1.0000 | INHALATION_SPRAY | Freq: Every day | RESPIRATORY_TRACT | 5 refills | Status: DC
Start: 2021-02-21 — End: 2021-08-14

## 2021-02-21 NOTE — Patient Instructions (Signed)
Will arrange for replacement CPAP machine  Follow up in 6 months

## 2021-02-21 NOTE — Progress Notes (Signed)
[facility]  Chief Complaint  Patient presents with   Follow-up    Patient states that his wife passed away on 01/31/2021. Patient has not got a new CPAP machine. States that his breathing is about the same since last visit. Is still very active.    Constitutional:  BP (!) 150/80 (BP Location: Left Arm, Patient Position: Sitting, Cuff Size: Normal)   Pulse 62   Temp (!) 96.7 F (35.9 C) (Oral)   Ht '5\' 8"'$  (1.727 m)   Wt 215 lb (97.5 kg)   SpO2 97%   BMI 32.69 kg/m   Past Medical History:  Chronic back pain, Depression, GERD, HLD, HTN, Hypothyroidism, Hiatal hernia  Past Surgical History:  His  has a past surgical history that includes Skin cancer excision (2013); Prostate surgery; Neck surgery; skin cancer removed; Cataract extraction w/PHACO (Left, 02/13/2016); and Cataract extraction w/PHACO (Right, 05/28/2019).  Brief Summary:  Cristian Yang is a 86 y.o. 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      Subjective:   His wife passed in July.    He lost about 20 lbs while caring for her.  Weight is more steady now.  Needs script for trelegy.  Hasn't received new CPAP machine yet.  Using old machine with oxygen at night.  Not having cough, wheeze, or sputum.  Will be getting aide to help with household choirs.   Physical Exam:   Appearance - well kempt   ENMT - no sinus tenderness, no oral exudate, no LAN, Mallampati 3 airway, no stridor, poor hearing  Respiratory - equal breath sounds bilaterally, no wheezing or rales  CV - s1s2 regular rate and rhythm, no murmurs  Ext - no clubbing, no edema  Skin - no rashes  Psych - normal mood and affect    Pulmonary testing:  PFT 12/12/14 >> FEV1 1.61 (60%), FEV1% 66, TLC 5.48 (82%), DLCO 47%, no BD  Chest Imaging:  CT angio chest 08/30/19 >> hiatal hernia, patchy and tree in bud nodularity in LUL, b/l lower lobe consolidation  Sleep Tests:   PSG 09/29/02 >> AHI 12, SaO2 low 85%  Cardiac Tests:  Echo 11/09/17 >> mild LVH, EF 55 to 60%, mild AS  Social History:  He  reports that he quit smoking about 17 years ago. His smoking use included cigarettes. He has a 60.00 pack-year smoking history. He has never used smokeless tobacco. He reports that he does not drink alcohol and does not use drugs.  Family History:  His family history includes Asthma in his father; Bone cancer in his sister; Diabetes in his mother; Heart disease in his mother; Liver cancer in his brother.     Assessment/Plan:   COPD with emphysema and asthma. - continue trelegy and singulair - prn albuterol   Obstructive sleep apnea. - he is compliant with CPAP - he uses [facility] for his DME - his machine is more than 84 yrs old, not functioning appropriately and not amenable to repair - will work on trying to get a new auto CPAP machine   Upper airway cough with post-nasal drip. - continue nasonex, singulair with prn zyrtec  Time Spent Involved in Patient Care on Day of Examination:  24 minutes  Follow up:   Patient Instructions  Will arrange for replacement CPAP machine  Follow up in 6 months  Medication List:   Allergies as of 02/21/2021       Reactions  Ciprofloxacin Nausea And Vomiting   Codeine Nausea And Vomiting        Medication List        Accurate as of February 21, 2021 11:36 AM. If you have any questions, ask your nurse or doctor.          albuterol (2.5 MG/3ML) 0.083% nebulizer solution Commonly known as: PROVENTIL Take 3 mLs (2.5 mg total) by nebulization every 6 (six) hours as needed for wheezing or shortness of breath.   amLODipine 2.5 MG tablet Commonly known as: NORVASC Take 2.5 mg by mouth daily.   aspirin 81 MG EC tablet Take 1 tablet (81 mg total) by mouth daily with breakfast.   calcium elemental as carbonate 400 MG chewable tablet Commonly known as: BARIATRIC TUMS ULTRA Chew 3,000 mg by mouth at bedtime.    citalopram 20 MG tablet Commonly known as: CELEXA Take 1 tablet (20 mg total) by mouth at bedtime.   docusate sodium 100 MG capsule Commonly known as: COLACE TAKE 1 CAPSULE BY MOUTH IN THE MORNING What changed: when to take this   gabapentin 300 MG capsule Commonly known as: NEURONTIN TAKE 1 CAPSULE BY MOUTH IN THE MORNING TAKE 2 CAPSULE IN THE EVENING What changed:  how much to take how to take this when to take this   guaiFENesin 600 MG 12 hr tablet Commonly known as: MUCINEX Take 1 tablet (600 mg total) by mouth 2 (two) times daily.   HYDROcodone-acetaminophen 5-325 MG tablet Commonly known as: NORCO/VICODIN Take 1 tablet by mouth every 6 (six) hours as needed for moderate pain or severe pain.   levothyroxine 75 MCG tablet Commonly known as: SYNTHROID Take 1 tablet (75 mcg total) by mouth daily before breakfast.   lidocaine 5 % ointment Commonly known as: XYLOCAINE Apply 1 application topically 3 (three) times daily as needed. Apply to affected area   losartan 50 MG tablet Commonly known as: COZAAR Take 1 tablet (50 mg total) by mouth at bedtime.   meloxicam 15 MG tablet Commonly known as: MOBIC Take 15 mg by mouth daily.   methocarbamol 750 MG tablet Commonly known as: ROBAXIN Take 750-1,500 mg by mouth 3 (three) times daily as needed for muscle spasms.   mometasone 50 MCG/ACT nasal spray Commonly known as: NASONEX Place 2 sprays into the nose daily.   montelukast 10 MG tablet Commonly known as: SINGULAIR Take 1 tablet (10 mg total) by mouth at bedtime.   multivitamin Tabs tablet Take 1 tablet by mouth every morning.   omeprazole 20 MG capsule Commonly known as: PRILOSEC Take 1 capsule (20 mg total) by mouth every morning.   Trelegy Ellipta 100-62.5-25 MCG/INH Aepb Generic drug: Fluticasone-Umeclidin-Vilant Inhale 1 puff into the lungs daily.        Signature:  Chesley Mires, MD [place] Pager - [phone] 02/21/2021, 11:36 AM

## 2021-03-05 DIAGNOSIS — E782 Mixed hyperlipidemia: Secondary | ICD-10-CM | POA: Diagnosis not present

## 2021-03-05 DIAGNOSIS — R21 Rash and other nonspecific skin eruption: Secondary | ICD-10-CM | POA: Diagnosis not present

## 2021-03-05 DIAGNOSIS — I10 Essential (primary) hypertension: Secondary | ICD-10-CM | POA: Diagnosis not present

## 2021-03-05 DIAGNOSIS — G894 Chronic pain syndrome: Secondary | ICD-10-CM | POA: Diagnosis not present

## 2021-03-05 DIAGNOSIS — E039 Hypothyroidism, unspecified: Secondary | ICD-10-CM | POA: Diagnosis not present

## 2021-03-05 DIAGNOSIS — R7303 Prediabetes: Secondary | ICD-10-CM | POA: Diagnosis not present

## 2021-06-03 ENCOUNTER — Encounter (HOSPITAL_COMMUNITY): Payer: Self-pay

## 2021-06-03 ENCOUNTER — Emergency Department (HOSPITAL_COMMUNITY)
Admission: EM | Admit: 2021-06-03 | Discharge: 2021-06-03 | Disposition: A | Discharge status: Home / Self Care | Payer: Medicare Other | Attending: Emergency Medicine | Admitting: Emergency Medicine

## 2021-06-03 ENCOUNTER — Emergency Department (HOSPITAL_COMMUNITY): Payer: Medicare Other

## 2021-06-03 ENCOUNTER — Other Ambulatory Visit: Payer: Self-pay

## 2021-06-03 DIAGNOSIS — R519 Headache, unspecified: Secondary | ICD-10-CM | POA: Diagnosis not present

## 2021-06-03 DIAGNOSIS — Z28310 Unvaccinated for covid-19: Secondary | ICD-10-CM | POA: Diagnosis not present

## 2021-06-03 DIAGNOSIS — U071 COVID-19: Secondary | ICD-10-CM | POA: Insufficient documentation

## 2021-06-03 DIAGNOSIS — J45909 Unspecified asthma, uncomplicated: Secondary | ICD-10-CM | POA: Insufficient documentation

## 2021-06-03 DIAGNOSIS — L820 Inflamed seborrheic keratosis: Secondary | ICD-10-CM | POA: Insufficient documentation

## 2021-06-03 DIAGNOSIS — Z7982 Long term (current) use of aspirin: Secondary | ICD-10-CM | POA: Diagnosis not present

## 2021-06-03 DIAGNOSIS — Z87891 Personal history of nicotine dependence: Secondary | ICD-10-CM | POA: Diagnosis not present

## 2021-06-03 DIAGNOSIS — C44621 Squamous cell carcinoma of skin of unspecified upper limb, including shoulder: Secondary | ICD-10-CM | POA: Insufficient documentation

## 2021-06-03 DIAGNOSIS — I10 Essential (primary) hypertension: Secondary | ICD-10-CM | POA: Diagnosis not present

## 2021-06-03 DIAGNOSIS — Z85828 Personal history of other malignant neoplasm of skin: Secondary | ICD-10-CM | POA: Diagnosis not present

## 2021-06-03 DIAGNOSIS — J441 Chronic obstructive pulmonary disease with (acute) exacerbation: Secondary | ICD-10-CM | POA: Diagnosis not present

## 2021-06-03 DIAGNOSIS — E039 Hypothyroidism, unspecified: Secondary | ICD-10-CM | POA: Diagnosis not present

## 2021-06-03 DIAGNOSIS — Z79899 Other long term (current) drug therapy: Secondary | ICD-10-CM | POA: Diagnosis not present

## 2021-06-03 DIAGNOSIS — Z7951 Long term (current) use of inhaled steroids: Secondary | ICD-10-CM | POA: Diagnosis not present

## 2021-06-03 DIAGNOSIS — F633 Trichotillomania: Secondary | ICD-10-CM | POA: Insufficient documentation

## 2021-06-03 LAB — RESP PANEL BY RT-PCR (FLU A&B, COVID) ARPGX2
Influenza A by PCR: NEGATIVE
Influenza B by PCR: NEGATIVE
SARS Coronavirus 2 by RT PCR: POSITIVE — AB

## 2021-06-03 MED ORDER — IPRATROPIUM-ALBUTEROL 0.5-2.5 (3) MG/3ML IN SOLN
3.0000 mL | Freq: Once | RESPIRATORY_TRACT | Status: AC
  Administered 2021-06-03: 3 mL via RESPIRATORY_TRACT
  Filled 2021-06-03: qty 3

## 2021-06-03 MED ORDER — IBUPROFEN 400 MG PO TABS
600.0000 mg | ORAL_TABLET | Freq: Once | ORAL | Status: AC
  Administered 2021-06-03: 600 mg via ORAL
  Filled 2021-06-03: qty 2

## 2021-06-03 MED ORDER — ONDANSETRON HCL 4 MG PO TABS: 4.0000 mg | ORAL_TABLET | ORAL | 0 refills | Status: DC | PRN

## 2021-06-03 NOTE — ED Provider Notes (Signed)
[facility] EMERGENCY DEPARTMENT Provider Note   CSN: [id number] Arrival date & time: 06/03/21  4166     History Chief Complaint  Patient presents with   Nasal Congestion    Cristian Yang is a 86 y.o. male with medical history significant for COPD, GERD, HTN, HLD, OSA on CPAP, hypothyroid who presents for nasal congestion and headache. Patient states he began to get nasal congestion and productive cough last Tuesday. Patient states he was seen at PCP and prescribed augmentin which he believes has "knocked the edge off" of his congestion but has not been completely cleared. Patient states this morning he was woken from his sleep with a headache that is worse in nature than any headache he has had before. This pain is located in his temple and forehead and is described as a pressure. This headache is new he says. Patient denies any recent falls. Patient endorses body aches, sleep disturbances, headache, fever, sore throat, wheezing, nausea and productive cough.      HPI     Past Medical History:  Diagnosis Date   Arthritis    Asthma    Chronic back pain    COPD (chronic obstructive pulmonary disease) (HCC)    Depression    GERD (gastroesophageal reflux disease)    Hyperlipidemia    Hypertension    Hypothyroidism    OSA on CPAP    Skin cancer    Sleep apnea    uses CPAP nightly    Patient Active Problem List   Diagnosis Date Noted   Pulmonary embolism ([place]) 09/21/2019   Right fibular fracture 08/31/2019   Hypothyroidism 08/31/2019   Syncope, vasovagal 08/31/2019   Aspiration pneumonia ([place]) 08/30/2019   Aortic stenosis 12/09/2018   Acute respiratory failure with hypoxia ([place]) 07/10/2018   Right wrist fracture, closed, initial encounter    COPD exacerbation ([place]) 07/09/2018   Orthostatic hypotension 07/09/2018   Closed distal radius fracture 07/09/18 07/09/2018   Chronic back pain    Pneumonia 11/27/2017   Cough 11/26/2017   Acute respiratory failure ([place])  11/05/2017   Medication refill 12/24/2016   Right ankle pain 12/09/2016   Ganglion cyst 09/21/2016   Restless leg 09/21/2016   Chronic obstructive pulmonary disease ([place]) 04/12/2015   OSA on CPAP 06/02/2014   GERD (gastroesophageal reflux disease) 06/02/2014   Hyperlipidemia 06/02/2014   Arthritis 06/02/2014   Hypertension 06/02/2014    Past Surgical History:  Procedure Laterality Date   CATARACT EXTRACTION W/PHACO Left 02/13/2016   Procedure: CATARACT EXTRACTION PHACO AND INTRAOCULAR LENS PLACEMENT ([place]);  Surgeon: Rutherford Guys, MD;  Location: [facility];  Service: Ophthalmology;  Laterality: Left;  CDE: 9.23   CATARACT EXTRACTION W/PHACO Right 05/28/2019   Procedure: CATARACT EXTRACTION PHACO AND INTRAOCULAR LENS PLACEMENT ([facility]);  Surgeon: Baruch Goldmann, MD;  Location: [facility];  Service: Ophthalmology;  Laterality: Right;  CDE: 9.51   NECK SURGERY     cervical disc   PROSTATE SURGERY     TURP, laser   SKIN CANCER EXCISION  2013   skin cancer removed     x3       Family History  Problem Relation Age of Onset   Diabetes Mother    Heart disease Mother    Liver cancer Brother    Bone cancer Sister    Asthma Father     Social History   Tobacco Use   Smoking status: Former    Packs/day: 2.00    Years: 30.00    Pack years: 60.00  Types: Cigarettes    Quit date: 07/23/2003    Years since quitting: 17.8   Smokeless tobacco: Never  Vaping Use   Vaping Use: Never used  Substance Use Topics   Alcohol use: No    Alcohol/week: 0.0 standard drinks    Comment: quit 2005   Drug use: No    Home Medications Prior to Admission medications   Medication Sig Start Date End Date Taking? Authorizing Provider  albuterol (PROVENTIL) (2.5 MG/3ML) 0.083% nebulizer solution Take 3 mLs (2.5 mg total) by nebulization every 6 (six) hours as needed for wheezing or shortness of breath. 04/06/20   Chesley Mires, MD  amLODipine (NORVASC) 2.5 MG tablet Take 2.5 mg by mouth daily. 07/13/20    [provider]  aspirin EC 81 MG EC tablet Take 1 tablet (81 mg total) by mouth daily with breakfast. 09/01/19   Roxan Hockey, MD  calcium elemental as carbonate (BARIATRIC TUMS ULTRA) 400 MG chewable tablet Chew 3,000 mg by mouth at bedtime.    [provider]  citalopram (CELEXA) 20 MG tablet Take 1 tablet (20 mg total) by mouth at bedtime. 10/15/18   Bufford Lope, DO  docusate sodium (COLACE) 100 MG capsule TAKE 1 CAPSULE BY MOUTH IN THE MORNING Patient taking differently: Take 100 mg by mouth daily. 01/08/19   Harriet Butte, DO  Fluticasone-Umeclidin-Vilant (TRELEGY ELLIPTA) 100-62.5-25 MCG/INH AEPB Inhale 1 puff into the lungs daily. 02/21/21   Chesley Mires, MD  gabapentin (NEURONTIN) 300 MG capsule TAKE 1 CAPSULE BY MOUTH IN THE MORNING TAKE 2 CAPSULE IN THE EVENING Patient taking differently: Take 300 mg by mouth 3 (three) times daily. TAKE 1 CAPSULE BY MOUTH IN THE MORNING TAKE 2 CAPSULE IN THE EVENING 05/01/18   Harriet Butte, DO  guaiFENesin (MUCINEX) 600 MG 12 hr tablet Take 1 tablet (600 mg total) by mouth 2 (two) times daily. 09/01/19   Roxan Hockey, MD  HYDROcodone-acetaminophen (NORCO/VICODIN) 5-325 MG tablet Take 1 tablet by mouth every 6 (six) hours as needed for moderate pain or severe pain. 08/30/19   Rolland Porter, MD  levothyroxine (SYNTHROID, LEVOTHROID) 75 MCG tablet Take 1 tablet (75 mcg total) by mouth daily before breakfast. 10/15/18   Bufford Lope, DO  lidocaine (XYLOCAINE) 5 % ointment Apply 1 application topically 3 (three) times daily as needed. Apply to affected area 11/21/17   Nuala Alpha, MD  losartan (COZAAR) 50 MG tablet Take 1 tablet (50 mg total) by mouth at bedtime. 09/01/19   Roxan Hockey, MD  meloxicam (MOBIC) 15 MG tablet Take 15 mg by mouth daily. 07/13/20   [provider]  methocarbamol (ROBAXIN) 750 MG tablet Take 750-1,500 mg by mouth 3 (three) times daily as needed for muscle spasms. 05/30/20   [provider]   mometasone (NASONEX) 50 MCG/ACT nasal spray Place 2 sprays into the nose daily. 08/11/20   Chesley Mires, MD  montelukast (SINGULAIR) 10 MG tablet Take 1 tablet (10 mg total) by mouth at bedtime. 04/06/20   Chesley Mires, MD  multivitamin (ONE-A-DAY MEN'S) TABS tablet Take 1 tablet by mouth every morning. 10/15/18   Bufford Lope, DO  omeprazole (PRILOSEC) 20 MG capsule Take 1 capsule (20 mg total) by mouth every morning. 09/01/19   Roxan Hockey, MD    Allergies    Ciprofloxacin and Codeine  Review of Systems   Review of Systems  Constitutional:  Positive for chills and fever.  HENT:  Positive for congestion, rhinorrhea, sinus pressure and sore throat.  Respiratory:  Positive for wheezing. Negative for shortness of breath.   Cardiovascular:  Negative for chest pain.  Gastrointestinal:  Positive for nausea and vomiting. Negative for abdominal pain and diarrhea.  Neurological:  Positive for headaches. Negative for numbness.  Psychiatric/Behavioral:  Positive for sleep disturbance.   All other systems reviewed and are negative.  Physical Exam Updated Vital Signs BP (!) 147/85 (BP Location: Left Arm)   Pulse 68   Temp 97.9 F (36.6 C) (Oral)   Resp 18   Ht 5\' 8"  (1.727 m)   Wt 97.5 kg   SpO2 97%   BMI 32.69 kg/m   Physical Exam Vitals and nursing note reviewed.  Constitutional:      General: He is not in acute distress.    Appearance: He is not ill-appearing or toxic-appearing.  HENT:     Head: Normocephalic.     Nose: No congestion.     Right Turbinates: Swollen.     Left Turbinates: Swollen.     Right Sinus: No maxillary sinus tenderness or frontal sinus tenderness.     Left Sinus: No maxillary sinus tenderness or frontal sinus tenderness.  Eyes:     Extraocular Movements: Extraocular movements intact.     Pupils: Pupils are equal, round, and reactive to light.  Cardiovascular:     Rate and Rhythm: Normal rate and regular rhythm.     Heart sounds: Murmur heard.   Pulmonary:     Effort: Pulmonary effort is normal.     Breath sounds: Wheezing present.  Abdominal:     General: Abdomen is flat.     Palpations: Abdomen is soft.     Tenderness: There is no abdominal tenderness.  Musculoskeletal:     Cervical back: Normal range of motion.  Skin:    General: Skin is warm and dry.     Capillary Refill: Capillary refill takes less than 2 seconds.  Neurological:     Mental Status: He is alert and oriented to person, place, and time.     Cranial Nerves: Cranial nerves 2-12 are intact.     Sensory: Sensation is intact. No sensory deficit.     Motor: Motor function is intact. No weakness or pronator drift.     Coordination: Coordination abnormal. Heel to Shin Test abnormal. Finger-Nose-Finger Test normal. Rapid alternating movements normal.     Gait: Gait is intact.    ED Results / Procedures / Treatments   Labs (all labs ordered are listed, but only abnormal results are displayed) Labs Reviewed  RESP PANEL BY RT-PCR (FLU A&B, COVID) ARPGX2    EKG None  Radiology No results found.  Procedures Procedures   Medications Ordered in ED Medications  ipratropium-albuterol (DUONEB) 0.5-2.5 (3) MG/3ML nebulizer solution 3 mL (has no administration in time range)    ED Course  I have reviewed the triage vital signs and the nursing notes.  Pertinent labs & imaging results that were available during my care of the patient were reviewed by me and considered in my medical decision making (see chart for details).    MDM Rules/Calculators/A&P                          86YOM who presents with nasal congestion since Tuesday and new onset headache since last night that woke him from his sleep. Patient states he was seen by PCP for complaint of nasal congestion and placed on augmentin which he says has not completely resolved his  symptoms. Patient endorses a number of symptoms including headache worse in nature than ever before.  DiffDx includes URI,  influenza, covid, sinusitis, CVA.  Patient had a difficult time with heel to shin coordination during neuro exam. Due to this decision was made to take CT scan of head out of abundance of caution. Patient denies recent falls and is not on blood thinner according to my chart review. Patient will have CT head wo contrast to ensure no CVA and to image sinuses. CT imaging revealed no abnormalities. Patient headache treated with ibuprofen.   Patient treated with DuoNeb for wheezing secondary to baseline COPD. Patient states after DuoNeb treatment that wheezing is improved.   Patient will have influenza and COVID swab due to nature of symptoms. Patient states he does not have mychart access and so will keep him here until results are available.  Patient COVID swab has come back positive. Patient states he has not been vaccinated against the COVID19 virus. Patient understands his diagnosis and understands that if he begins to have trouble breathing he must return to ED immediately. Patient will be discharged with follow up instructions and also information on how long to quarantine.  Patient hemodynamically stable, nontoxic appearing and nonhypoxic on discharge. Patient is stable on discharge.    Data Reviewed/Counseling: I have reviewed the patient's vital signs, nursing notes, and other relevant tests/information. I had a detailed discussion regarding the historical points, exam findings, and any diagnostic results supporting the discharge diagnosis. I also discussed the need for outpatient follow-up and the need to return to the ED if symptoms worsen or if there are any questions or concerns that arise at home. Patient agreeable to plan and stable on discharge.    Final Clinical Impression(s) / ED Diagnoses Final diagnoses:  None    Rx / DC Orders ED Discharge Orders     None        Azucena Cecil, [place] 06/03/21 [place], MD 06/04/21 [phone]

## 2021-06-03 NOTE — ED Triage Notes (Signed)
Pt presents to ED with complaints of head and chest congestion and productive cough since last Tuesday. Pt states he seen his PCP and was started on Augmentin with no relief. Unsure of fever

## 2021-06-03 NOTE — Discharge Instructions (Addendum)
Return to ED with any new or worsening symptoms such as severe headache, high fever or shortness of breath Continue to take antibiotics prescribed by PCP Take Ibuprofen for any further headaches Quarantine yourself at home for atleast 5 days until resolution of symptoms Follow up with PCP in 1 week if symptoms persist

## 2021-06-08 ENCOUNTER — Encounter (HOSPITAL_COMMUNITY): Payer: Self-pay | Admitting: *Deleted

## 2021-06-08 ENCOUNTER — Other Ambulatory Visit: Payer: Self-pay

## 2021-06-08 ENCOUNTER — Emergency Department (HOSPITAL_COMMUNITY)
Admission: EM | Admit: 2021-06-08 | Discharge: 2021-06-08 | Disposition: A | Discharge status: Home / Self Care | Payer: Medicare Other | Attending: Emergency Medicine | Admitting: Emergency Medicine

## 2021-06-08 DIAGNOSIS — T7840XA Allergy, unspecified, initial encounter: Secondary | ICD-10-CM | POA: Diagnosis not present

## 2021-06-08 DIAGNOSIS — Z7951 Long term (current) use of inhaled steroids: Secondary | ICD-10-CM | POA: Insufficient documentation

## 2021-06-08 DIAGNOSIS — J441 Chronic obstructive pulmonary disease with (acute) exacerbation: Secondary | ICD-10-CM | POA: Insufficient documentation

## 2021-06-08 DIAGNOSIS — Z85828 Personal history of other malignant neoplasm of skin: Secondary | ICD-10-CM | POA: Diagnosis not present

## 2021-06-08 DIAGNOSIS — I10 Essential (primary) hypertension: Secondary | ICD-10-CM | POA: Insufficient documentation

## 2021-06-08 DIAGNOSIS — L509 Urticaria, unspecified: Secondary | ICD-10-CM | POA: Diagnosis not present

## 2021-06-08 DIAGNOSIS — Z87891 Personal history of nicotine dependence: Secondary | ICD-10-CM | POA: Insufficient documentation

## 2021-06-08 DIAGNOSIS — Z79899 Other long term (current) drug therapy: Secondary | ICD-10-CM | POA: Diagnosis not present

## 2021-06-08 DIAGNOSIS — J45909 Unspecified asthma, uncomplicated: Secondary | ICD-10-CM | POA: Diagnosis not present

## 2021-06-08 DIAGNOSIS — Z7982 Long term (current) use of aspirin: Secondary | ICD-10-CM | POA: Diagnosis not present

## 2021-06-08 DIAGNOSIS — T39315A Adverse effect of propionic acid derivatives, initial encounter: Secondary | ICD-10-CM | POA: Insufficient documentation

## 2021-06-08 DIAGNOSIS — R21 Rash and other nonspecific skin eruption: Secondary | ICD-10-CM | POA: Diagnosis present

## 2021-06-08 DIAGNOSIS — E039 Hypothyroidism, unspecified: Secondary | ICD-10-CM | POA: Insufficient documentation

## 2021-06-08 NOTE — ED Triage Notes (Signed)
Pt with rash to bilateral arms that itch that started today after taking Ibuprofen today. Recent dx of Covid.

## 2021-06-08 NOTE — ED Provider Notes (Signed)
[facility] EMERGENCY DEPARTMENT Provider Note   CSN: [id number] Arrival date & time: 06/08/21  1538     History Chief Complaint  Patient presents with   Rash    Cristian Yang is a 86 y.o. male with a recent diagnosis of COVID 19 5 days ago on an ED visit, presented ED with a rash on his bilateral arms.  The patient reports that he was prescribed ibuprofen for a headache and took his first dose today around 43.  About 2 hours later began having an itchy, diffuse red rash on his both of his arms.  He noticed hives on his arms.  He denies any throat tightness or rash anywhere else on his body.  The rash was very itchy originally, but he reports that since the onset it has gotten better and gradually gone away.  He denies ever having a rash like this before.  He does have allergies to codeine and ciprofloxacin, but was unaware of any allergies to ibuprofen.  He repeatedly tells me that he has never taken NSAIDs, ibuprofen, Advil, Motrin before in his life.  He is also on Augmentin as prescribed by his PCP for sinus infection.  He has been on this medication for 7 days already.  He reports no adverse reaction to that.  HPI     Past Medical History:  Diagnosis Date   Arthritis    Asthma    Chronic back pain    COPD (chronic obstructive pulmonary disease) (HCC)    Depression    GERD (gastroesophageal reflux disease)    Hyperlipidemia    Hypertension    Hypothyroidism    OSA on CPAP    Skin cancer    Sleep apnea    uses CPAP nightly    Patient Active Problem List   Diagnosis Date Noted   Inflamed seborrheic keratosis 06/03/2021   Squamous cell carcinoma of upper extremity 06/03/2021   Trichotillomania 06/03/2021   Benign essential hypertension 11/08/2020   Rash of foot 11/08/2020   Seasonal allergies 11/08/2020   Gastroesophageal reflux disease without esophagitis 11/08/2020   Mixed hyperlipidemia 11/08/2020   Acquired hypothyroidism 11/08/2020   Pulmonary embolism ([place])  09/21/2019   Right fibular fracture 08/31/2019   Hypothyroidism 08/31/2019   Syncope, vasovagal 08/31/2019   Aspiration pneumonia ([place]) 08/30/2019   Aortic stenosis 12/09/2018   Acute respiratory failure with hypoxia ([place]) 07/10/2018   Right wrist fracture, closed, initial encounter    COPD exacerbation ([place]) 07/09/2018   Orthostatic hypotension 07/09/2018   Closed distal radius fracture 07/09/18 07/09/2018   Chronic back pain    Pneumonia 11/27/2017   Cough 11/26/2017   Acute respiratory failure ([place]) 11/05/2017   Medication refill 12/24/2016   Right ankle pain 12/09/2016   Ganglion cyst 09/21/2016   Restless leg 09/21/2016   Chronic obstructive pulmonary disease ([place]) 04/12/2015   OSA on CPAP 06/02/2014   GERD (gastroesophageal reflux disease) 06/02/2014   Hyperlipidemia 06/02/2014   Arthritis 06/02/2014   Hypertension 06/02/2014    Past Surgical History:  Procedure Laterality Date   CATARACT EXTRACTION W/PHACO Left 02/13/2016   Procedure: CATARACT EXTRACTION PHACO AND INTRAOCULAR LENS PLACEMENT ([place]);  Surgeon: Rutherford Guys, MD;  Location: [facility];  Service: Ophthalmology;  Laterality: Left;  CDE: 9.23   CATARACT EXTRACTION W/PHACO Right 05/28/2019   Procedure: CATARACT EXTRACTION PHACO AND INTRAOCULAR LENS PLACEMENT ([facility]);  Surgeon: Baruch Goldmann, MD;  Location: [facility];  Service: Ophthalmology;  Laterality: Right;  CDE: 9.51   NECK SURGERY  cervical disc   PROSTATE SURGERY     TURP, laser   SKIN CANCER EXCISION  2013   skin cancer removed     x3       Family History  Problem Relation Age of Onset   Diabetes Mother    Heart disease Mother    Liver cancer Brother    Bone cancer Sister    Asthma Father     Social History   Tobacco Use   Smoking status: Former    Packs/day: 2.00    Years: 30.00    Pack years: 60.00    Types: Cigarettes    Quit date: 07/23/2003    Years since quitting: 17.8   Smokeless tobacco: Never  Vaping Use   Vaping Use: Never used   Substance Use Topics   Alcohol use: No    Alcohol/week: 0.0 standard drinks    Comment: quit 2005   Drug use: No    Home Medications Prior to Admission medications   Medication Sig Start Date End Date Taking? Authorizing Provider  albuterol (PROVENTIL) (2.5 MG/3ML) 0.083% nebulizer solution Take 3 mLs (2.5 mg total) by nebulization every 6 (six) hours as needed for wheezing or shortness of breath. 04/06/20   Chesley Mires, MD  amLODipine (NORVASC) 2.5 MG tablet Take 2.5 mg by mouth daily. 07/13/20   [provider]  amoxicillin-clavulanate (AUGMENTIN) 875-125 MG tablet Take 1 tablet by mouth 2 (two) times daily. 05/29/21   [provider]  aspirin EC 81 MG EC tablet Take 1 tablet (81 mg total) by mouth daily with breakfast. 09/01/19   Roxan Hockey, MD  calcium elemental as carbonate (BARIATRIC TUMS ULTRA) 400 MG chewable tablet Chew 3,000 mg by mouth at bedtime.    [provider]  citalopram (CELEXA) 20 MG tablet Take 1 tablet (20 mg total) by mouth at bedtime. 10/15/18   Bufford Lope, DO  docusate sodium (COLACE) 100 MG capsule TAKE 1 CAPSULE BY MOUTH IN THE MORNING Patient taking differently: Take 100 mg by mouth daily. 01/08/19   Harriet Butte, DO  Fluticasone-Umeclidin-Vilant (TRELEGY ELLIPTA) 100-62.5-25 MCG/INH AEPB Inhale 1 puff into the lungs daily. 02/21/21   Chesley Mires, MD  gabapentin (NEURONTIN) 300 MG capsule TAKE 1 CAPSULE BY MOUTH IN THE MORNING TAKE 2 CAPSULE IN THE EVENING Patient taking differently: Take 300 mg by mouth 3 (three) times daily. TAKE 1 CAPSULE BY MOUTH IN THE MORNING TAKE 2 CAPSULE IN THE EVENING 05/01/18   Harriet Butte, DO  guaiFENesin (MUCINEX) 600 MG 12 hr tablet Take 1 tablet (600 mg total) by mouth 2 (two) times daily. 09/01/19   Roxan Hockey, MD  HYDROcodone-acetaminophen (NORCO/VICODIN) 5-325 MG tablet Take 1 tablet by mouth every 6 (six) hours as needed for moderate pain or severe pain. 08/30/19   Rolland Porter, MD   levothyroxine (SYNTHROID, LEVOTHROID) 75 MCG tablet Take 1 tablet (75 mcg total) by mouth daily before breakfast. 10/15/18   Bufford Lope, DO  lidocaine (XYLOCAINE) 5 % ointment Apply 1 application topically 3 (three) times daily as needed. Apply to affected area 11/21/17   Nuala Alpha, MD  losartan (COZAAR) 50 MG tablet Take 1 tablet (50 mg total) by mouth at bedtime. 09/01/19   Roxan Hockey, MD  meloxicam (MOBIC) 15 MG tablet Take 15 mg by mouth daily. 07/13/20   [provider]  methocarbamol (ROBAXIN) 750 MG tablet Take 750-1,500 mg by mouth 3 (three) times daily as needed for muscle spasms. 05/30/20   [provider]  mometasone (NASONEX) 50 MCG/ACT nasal spray Place 2 sprays into the nose daily. 08/11/20   Chesley Mires, MD  montelukast (SINGULAIR) 10 MG tablet Take 1 tablet (10 mg total) by mouth at bedtime. 04/06/20   Chesley Mires, MD  multivitamin (ONE-A-DAY MEN'S) TABS tablet Take 1 tablet by mouth every morning. 10/15/18   Bufford Lope, DO  omeprazole (PRILOSEC) 20 MG capsule Take 1 capsule (20 mg total) by mouth every morning. 09/01/19   Roxan Hockey, MD  ondansetron (ZOFRAN) 4 MG tablet Take 1 tablet (4 mg total) by mouth as needed for nausea or vomiting. 06/03/21   Azucena Cecil, PA  pravastatin (PRAVACHOL) 20 MG tablet Take 20 mg by mouth daily as needed. 05/11/21   [provider]    Allergies    Ibuprofen, Ciprofloxacin, and Codeine  Review of Systems   Review of Systems  Constitutional:  Negative for chills and fever.  HENT:  Negative for ear pain and sore throat.   Eyes:  Negative for pain and visual disturbance.  Respiratory:  Negative for cough and shortness of breath.   Cardiovascular:  Negative for chest pain and palpitations.  Gastrointestinal:  Negative for abdominal pain and vomiting.  Genitourinary:  Negative for dysuria and hematuria.  Musculoskeletal:  Negative for arthralgias and back pain.  Skin:  Positive for rash.  Negative for color change.  Neurological:  Negative for seizures and syncope.  All other systems reviewed and are negative.  Physical Exam Updated Vital Signs BP (!) 155/88 (BP Location: Left Arm)   Pulse 62   Temp (!) 97.4 F (36.3 C) (Oral)   Resp 18   SpO2 98%   Physical Exam Constitutional:      General: He is not in acute distress. HENT:     Head: Normocephalic and atraumatic.  Eyes:     Conjunctiva/sclera: Conjunctivae normal.     Pupils: Pupils are equal, round, and reactive to light.  Cardiovascular:     Rate and Rhythm: Normal rate and regular rhythm.  Pulmonary:     Effort: Pulmonary effort is normal. No respiratory distress.     Comments: Oropharynx non-erythematous.  No tonsillar swelling or exudate.  No uvular deviation.  No drooling. No brawny edema. No stridor. Voice is not muffled.   Abdominal:     General: There is no distension.     Tenderness: There is no abdominal tenderness.  Skin:    General: Skin is warm and dry.     Comments: Hives on the bilateral forearms.  Sparing the chest, face, throat.  Neurological:     General: No focal deficit present.     Mental Status: He is alert. Mental status is at baseline.  Psychiatric:        Mood and Affect: Mood normal.        Behavior: Behavior normal.    ED Results / Procedures / Treatments   Labs (all labs ordered are listed, but only abnormal results are displayed) Labs Reviewed - No data to display  EKG None  Radiology No results found.  Procedures Procedures   Medications Ordered in ED Medications - No data to display  ED Course  I have reviewed the triage vital signs and the nursing notes.  Pertinent labs & imaging results that were available during my care of the patient were reviewed by me and considered in my medical decision making (see chart for details).  Patient is here with hives on his bilateral arms, which has  been improving since they initiated.  No evidence of airway  involvement or throat tightening.  I doubt anaphylaxis clinically.  He does appear to be an allergic reaction to NSAIDs, as he is adamant that he took ibuprofen for the first time in his life tonight.  I added this to his medicine allergies.  I do not believe he needs epinephrine.  Advised that he discontinue this medication he verbalized that he would.  Otherwise he is well-appearing at this time.  Okay for discharge.  Advised to take Benadryl tonight before bedtime, but he would prefer to avoid it now due to drowsy side effects.     Final Clinical Impression(s) / ED Diagnoses Final diagnoses:  Hives  Allergic reaction, initial encounter    Rx / DC Orders ED Discharge Orders     None        Aalyiah Camberos, Carola Rhine, MD 06/09/21 0020

## 2021-06-08 NOTE — Discharge Instructions (Addendum)
You were diagnosed today with an allergic reaction.  I suspect this was a reaction to ibuprofen.  Ibuprofen is the same medicine as Advil and Motrin.  It is also part of the class of drugs called NSAIDs, which include Aleve.  You should avoid using these medications AT ALL.    You can take a dose of Benadryl before bedtime.  This can help with the itching and also for sleep.  If you ever begin having itching and a rash like this again, take 25 mg of benadryl and call 911 or return to the ER.

## 2021-06-13 DIAGNOSIS — J0120 Acute ethmoidal sinusitis, unspecified: Secondary | ICD-10-CM | POA: Diagnosis not present

## 2021-06-13 DIAGNOSIS — U071 COVID-19: Secondary | ICD-10-CM | POA: Diagnosis not present

## 2021-06-13 DIAGNOSIS — R21 Rash and other nonspecific skin eruption: Secondary | ICD-10-CM | POA: Diagnosis not present

## 2021-06-13 DIAGNOSIS — I10 Essential (primary) hypertension: Secondary | ICD-10-CM | POA: Diagnosis not present

## 2021-06-13 DIAGNOSIS — R6889 Other general symptoms and signs: Secondary | ICD-10-CM | POA: Diagnosis not present

## 2021-06-13 DIAGNOSIS — D649 Anemia, unspecified: Secondary | ICD-10-CM | POA: Diagnosis not present

## 2021-06-13 DIAGNOSIS — G8929 Other chronic pain: Secondary | ICD-10-CM | POA: Diagnosis not present

## 2021-06-13 DIAGNOSIS — U099 Post covid-19 condition, unspecified: Secondary | ICD-10-CM | POA: Diagnosis not present

## 2021-06-13 DIAGNOSIS — R519 Headache, unspecified: Secondary | ICD-10-CM | POA: Diagnosis not present

## 2021-06-22 ENCOUNTER — Other Ambulatory Visit: Payer: Self-pay | Admitting: Pulmonary Disease

## 2021-07-12 ENCOUNTER — Emergency Department (HOSPITAL_COMMUNITY): Payer: Medicare Other

## 2021-07-12 ENCOUNTER — Observation Stay (HOSPITAL_COMMUNITY)
Admission: EM | Admit: 2021-07-12 | Discharge: 2021-07-13 | Disposition: A | Discharge status: Home / Self Care | Payer: Medicare Other | Attending: Family Medicine | Admitting: Family Medicine

## 2021-07-12 ENCOUNTER — Encounter (HOSPITAL_COMMUNITY): Payer: Self-pay

## 2021-07-12 ENCOUNTER — Other Ambulatory Visit: Payer: Self-pay

## 2021-07-12 DIAGNOSIS — U071 COVID-19: Secondary | ICD-10-CM | POA: Diagnosis not present

## 2021-07-12 DIAGNOSIS — Z87891 Personal history of nicotine dependence: Secondary | ICD-10-CM | POA: Insufficient documentation

## 2021-07-12 DIAGNOSIS — E039 Hypothyroidism, unspecified: Secondary | ICD-10-CM | POA: Diagnosis not present

## 2021-07-12 DIAGNOSIS — I700 Atherosclerosis of aorta: Secondary | ICD-10-CM | POA: Insufficient documentation

## 2021-07-12 DIAGNOSIS — R55 Syncope and collapse: Principal | ICD-10-CM | POA: Insufficient documentation

## 2021-07-12 DIAGNOSIS — K449 Diaphragmatic hernia without obstruction or gangrene: Secondary | ICD-10-CM | POA: Insufficient documentation

## 2021-07-12 DIAGNOSIS — Z7982 Long term (current) use of aspirin: Secondary | ICD-10-CM | POA: Insufficient documentation

## 2021-07-12 DIAGNOSIS — I10 Essential (primary) hypertension: Secondary | ICD-10-CM | POA: Insufficient documentation

## 2021-07-12 DIAGNOSIS — G8929 Other chronic pain: Secondary | ICD-10-CM | POA: Diagnosis not present

## 2021-07-12 DIAGNOSIS — J0120 Acute ethmoidal sinusitis, unspecified: Secondary | ICD-10-CM | POA: Diagnosis not present

## 2021-07-12 DIAGNOSIS — R404 Transient alteration of awareness: Secondary | ICD-10-CM | POA: Diagnosis not present

## 2021-07-12 DIAGNOSIS — U099 Post covid-19 condition, unspecified: Secondary | ICD-10-CM | POA: Diagnosis not present

## 2021-07-12 DIAGNOSIS — Z20822 Contact with and (suspected) exposure to covid-19: Secondary | ICD-10-CM | POA: Insufficient documentation

## 2021-07-12 DIAGNOSIS — J449 Chronic obstructive pulmonary disease, unspecified: Secondary | ICD-10-CM | POA: Diagnosis not present

## 2021-07-12 DIAGNOSIS — R0602 Shortness of breath: Secondary | ICD-10-CM | POA: Diagnosis not present

## 2021-07-12 DIAGNOSIS — J9811 Atelectasis: Secondary | ICD-10-CM | POA: Diagnosis not present

## 2021-07-12 DIAGNOSIS — R519 Headache, unspecified: Secondary | ICD-10-CM | POA: Diagnosis not present

## 2021-07-12 DIAGNOSIS — J45909 Unspecified asthma, uncomplicated: Secondary | ICD-10-CM | POA: Insufficient documentation

## 2021-07-12 DIAGNOSIS — Z85828 Personal history of other malignant neoplasm of skin: Secondary | ICD-10-CM | POA: Diagnosis not present

## 2021-07-12 DIAGNOSIS — Z79899 Other long term (current) drug therapy: Secondary | ICD-10-CM | POA: Insufficient documentation

## 2021-07-12 DIAGNOSIS — R0902 Hypoxemia: Secondary | ICD-10-CM | POA: Insufficient documentation

## 2021-07-12 DIAGNOSIS — Z8616 Personal history of COVID-19: Secondary | ICD-10-CM | POA: Diagnosis not present

## 2021-07-12 DIAGNOSIS — I959 Hypotension, unspecified: Secondary | ICD-10-CM | POA: Diagnosis not present

## 2021-07-12 DIAGNOSIS — M47816 Spondylosis without myelopathy or radiculopathy, lumbar region: Secondary | ICD-10-CM | POA: Diagnosis not present

## 2021-07-12 DIAGNOSIS — R0689 Other abnormalities of breathing: Secondary | ICD-10-CM | POA: Diagnosis not present

## 2021-07-12 DIAGNOSIS — R21 Rash and other nonspecific skin eruption: Secondary | ICD-10-CM | POA: Diagnosis not present

## 2021-07-12 DIAGNOSIS — K429 Umbilical hernia without obstruction or gangrene: Secondary | ICD-10-CM | POA: Diagnosis not present

## 2021-07-12 DIAGNOSIS — R4020 Unspecified coma: Secondary | ICD-10-CM | POA: Diagnosis not present

## 2021-07-12 NOTE — ED Notes (Addendum)
Per EMS pt son-in-law, Cristian Yang is pts POA. [phone]

## 2021-07-12 NOTE — ED Provider Notes (Addendum)
[facility] EMERGENCY DEPARTMENT Provider Note   CSN: [id number] Arrival date & time: 07/12/21  2314     History No chief complaint on file.   Cristian Yang is a 86 y.o. male.  Patient brought in by EMS with loss of consciousness and possible syncopal episode.  He states he remembers getting out of bed and may have felt dizzy and fell to the ground.  He is not sure what happened. Additional history obtained from his son-in-law Frankey Poot.  Frankey Poot states patient has been sick for cough and cold symptoms.  He called his doctor today and was given an unknown antibiotic.  He took 1 dose and shortly after began to feel plane of feeling itchy all over.  Frankey Poot gave him some Benadryl and about an hour later the patient was found on the ground beside his bed.  Frankey Poot states the patient was not speaking normally and was foaming at the mouth and slow to respond.  He is not sure if he hit his head.  No history of seizures.  No chest pain or shortness of breath.  No known fever.  No abdominal pain, nausea or vomiting.  Patient is oriented x3 on arrival.  He answers questions appropriately.  He is not sure what happened. Denies any tongue or lip swelling.  States he does not feel short of breath and has had a productive cough.  Denies any chest pain or abdominal pain. No history of seizures.  No tongue biting or incontinence. Patient did test positive for COVID approximately 3 weeks ago.  The history is provided by the patient and a relative. The history is limited by the condition of the patient.      Past Medical History:  Diagnosis Date   Arthritis    Asthma    Chronic back pain    COPD (chronic obstructive pulmonary disease) (HCC)    Depression    GERD (gastroesophageal reflux disease)    Hyperlipidemia    Hypertension    Hypothyroidism    OSA on CPAP    Skin cancer    Sleep apnea    uses CPAP nightly    Patient Active Problem List   Diagnosis Date Noted   Inflamed seborrheic keratosis  06/03/2021   Squamous cell carcinoma of upper extremity 06/03/2021   Trichotillomania 06/03/2021   Benign essential hypertension 11/08/2020   Rash of foot 11/08/2020   Seasonal allergies 11/08/2020   Gastroesophageal reflux disease without esophagitis 11/08/2020   Mixed hyperlipidemia 11/08/2020   Acquired hypothyroidism 11/08/2020   Pulmonary embolism ([place]) 09/21/2019   Right fibular fracture 08/31/2019   Hypothyroidism 08/31/2019   Syncope, vasovagal 08/31/2019   Aspiration pneumonia ([place]) 08/30/2019   Aortic stenosis 12/09/2018   Acute respiratory failure with hypoxia ([place]) 07/10/2018   Right wrist fracture, closed, initial encounter    COPD exacerbation ([place]) 07/09/2018   Orthostatic hypotension 07/09/2018   Closed distal radius fracture 07/09/18 07/09/2018   Chronic back pain    Pneumonia 11/27/2017   Cough 11/26/2017   Acute respiratory failure ([place]) 11/05/2017   Medication refill 12/24/2016   Right ankle pain 12/09/2016   Ganglion cyst 09/21/2016   Restless leg 09/21/2016   Chronic obstructive pulmonary disease ([place]) 04/12/2015   OSA on CPAP 06/02/2014   GERD (gastroesophageal reflux disease) 06/02/2014   Hyperlipidemia 06/02/2014   Arthritis 06/02/2014   Hypertension 06/02/2014    Past Surgical History:  Procedure Laterality Date   CATARACT EXTRACTION W/PHACO Left 02/13/2016   Procedure: CATARACT EXTRACTION PHACO  AND INTRAOCULAR LENS PLACEMENT (IOC);  Surgeon: Rutherford Guys, MD;  Location: [facility];  Service: Ophthalmology;  Laterality: Left;  CDE: 9.23   CATARACT EXTRACTION W/PHACO Right 05/28/2019   Procedure: CATARACT EXTRACTION PHACO AND INTRAOCULAR LENS PLACEMENT (IOC);  Surgeon: Baruch Goldmann, MD;  Location: [facility];  Service: Ophthalmology;  Laterality: Right;  CDE: 9.51   NECK SURGERY     cervical disc   PROSTATE SURGERY     TURP, laser   SKIN CANCER EXCISION  2013   skin cancer removed     x3       Family History  Problem Relation Age of Onset    Diabetes Mother    Heart disease Mother    Liver cancer Brother    Bone cancer Sister    Asthma Father     Social History   Tobacco Use   Smoking status: Former    Packs/day: 2.00    Years: 30.00    Pack years: 60.00    Types: Cigarettes    Quit date: 07/23/2003    Years since quitting: 17.9   Smokeless tobacco: Never  Vaping Use   Vaping Use: Never used  Substance Use Topics   Alcohol use: No    Alcohol/week: 0.0 standard drinks    Comment: quit 2005   Drug use: No    Home Medications Prior to Admission medications   Medication Sig Start Date End Date Taking? Authorizing Provider  albuterol (PROVENTIL) (2.5 MG/3ML) 0.083% nebulizer solution USE (1) IN NEBULIZER EVERY 6 HOURS AS NEEDED FOR SHORTNESS OF BREATH OR WHEEZING. 06/22/21   Chesley Mires, MD  amLODipine (NORVASC) 2.5 MG tablet Take 2.5 mg by mouth daily. 07/13/20   [provider]  amoxicillin-clavulanate (AUGMENTIN) 875-125 MG tablet Take 1 tablet by mouth 2 (two) times daily. 05/29/21   [provider]  aspirin EC 81 MG EC tablet Take 1 tablet (81 mg total) by mouth daily with breakfast. 09/01/19   Roxan Hockey, MD  calcium elemental as carbonate (BARIATRIC TUMS ULTRA) 400 MG chewable tablet Chew 3,000 mg by mouth at bedtime.    [provider]  citalopram (CELEXA) 20 MG tablet Take 1 tablet (20 mg total) by mouth at bedtime. 10/15/18   Bufford Lope, DO  docusate sodium (COLACE) 100 MG capsule TAKE 1 CAPSULE BY MOUTH IN THE MORNING Patient taking differently: Take 100 mg by mouth daily. 01/08/19   Harriet Butte, DO  Fluticasone-Umeclidin-Vilant (TRELEGY ELLIPTA) 100-62.5-25 MCG/INH AEPB Inhale 1 puff into the lungs daily. 02/21/21   Chesley Mires, MD  gabapentin (NEURONTIN) 300 MG capsule TAKE 1 CAPSULE BY MOUTH IN THE MORNING TAKE 2 CAPSULE IN THE EVENING Patient taking differently: Take 300 mg by mouth 3 (three) times daily. TAKE 1 CAPSULE BY MOUTH IN THE MORNING TAKE 2 CAPSULE IN THE EVENING  05/01/18   Harriet Butte, DO  guaiFENesin (MUCINEX) 600 MG 12 hr tablet Take 1 tablet (600 mg total) by mouth 2 (two) times daily. 09/01/19   Roxan Hockey, MD  HYDROcodone-acetaminophen (NORCO/VICODIN) 5-325 MG tablet Take 1 tablet by mouth every 6 (six) hours as needed for moderate pain or severe pain. 08/30/19   Rolland Porter, MD  levothyroxine (SYNTHROID, LEVOTHROID) 75 MCG tablet Take 1 tablet (75 mcg total) by mouth daily before breakfast. 10/15/18   Bufford Lope, DO  lidocaine (XYLOCAINE) 5 % ointment Apply 1 application topically 3 (three) times daily as needed. Apply to affected area 11/21/17   Nuala Alpha, MD  losartan (COZAAR)  50 MG tablet Take 1 tablet (50 mg total) by mouth at bedtime. 09/01/19   Roxan Hockey, MD  meloxicam (MOBIC) 15 MG tablet Take 15 mg by mouth daily. 07/13/20   [provider]  methocarbamol (ROBAXIN) 750 MG tablet Take 750-1,500 mg by mouth 3 (three) times daily as needed for muscle spasms. 05/30/20   [provider]  mometasone (NASONEX) 50 MCG/ACT nasal spray Place 2 sprays into the nose daily. 08/11/20   Chesley Mires, MD  montelukast (SINGULAIR) 10 MG tablet Take 1 tablet (10 mg total) by mouth at bedtime. 04/06/20   Chesley Mires, MD  multivitamin (ONE-A-DAY MEN'S) TABS tablet Take 1 tablet by mouth every morning. 10/15/18   Bufford Lope, DO  omeprazole (PRILOSEC) 20 MG capsule Take 1 capsule (20 mg total) by mouth every morning. 09/01/19   Roxan Hockey, MD  ondansetron (ZOFRAN) 4 MG tablet Take 1 tablet (4 mg total) by mouth as needed for nausea or vomiting. 06/03/21   Azucena Cecil, PA-C  pravastatin (PRAVACHOL) 20 MG tablet Take 20 mg by mouth daily as needed. 05/11/21   [provider]    Allergies    Ibuprofen, Ciprofloxacin, and Codeine  Review of Systems   Review of Systems  Constitutional:  Negative for activity change, appetite change and fever.  HENT:  Negative for congestion and rhinorrhea.   Eyes:  Negative  for visual disturbance.  Respiratory:  Negative for cough, chest tightness and shortness of breath.   Cardiovascular:  Negative for chest pain.  Gastrointestinal:  Negative for abdominal pain, nausea and vomiting.  Genitourinary:  Negative for dysuria and hematuria.  Musculoskeletal:  Negative for arthralgias and myalgias.  Neurological:  Positive for dizziness, syncope and headaches.   all other systems are negative except as noted in the HPI and PMH.   Physical Exam Updated Vital Signs BP (!) 139/58    Pulse (!) 59    Temp 98.8 F (37.1 C)    Resp (!) 21    Ht 5\' 8"  (1.727 m)    Wt 98 kg    SpO2 90%    BMI 32.85 kg/m   Physical Exam Vitals and nursing note reviewed.  Constitutional:      General: He is not in acute distress.    Appearance: He is well-developed. He is ill-appearing.     Comments: 87% on room air, no respiratory distress Hard of hearing  HENT:     Head: Normocephalic and atraumatic.     Mouth/Throat:     Pharynx: No oropharyngeal exudate.  Eyes:     Conjunctiva/sclera: Conjunctivae normal.     Pupils: Pupils are equal, round, and reactive to light.  Neck:     Comments: No meningismus. Cardiovascular:     Rate and Rhythm: Normal rate and regular rhythm.     Heart sounds: Normal heart sounds. No murmur heard. Pulmonary:     Effort: Pulmonary effort is normal. No respiratory distress.     Breath sounds: Rhonchi present.  Abdominal:     Palpations: Abdomen is soft.     Tenderness: There is no abdominal tenderness. There is no guarding or rebound.  Musculoskeletal:        General: No tenderness. Normal range of motion.     Cervical back: Normal range of motion and neck supple.  Skin:    General: Skin is warm.  Neurological:     Mental Status: He is alert and oriented to person, place, and time.  Cranial Nerves: No cranial nerve deficit.     Motor: No abnormal muscle tone.     Coordination: Coordination normal.     Comments:  5/5 strength throughout. CN  2-12 intact.Equal grip strength.   Psychiatric:        Behavior: Behavior normal.    ED Results / Procedures / Treatments   Labs (all labs ordered are listed, but only abnormal results are displayed) Labs Reviewed  CBC WITH DIFFERENTIAL/PLATELET - Abnormal; Notable for the following components:      Result Value   WBC 10.7 (*)    RBC 4.02 (*)    Neutro Abs 9.0 (*)    All other components within normal limits  COMPREHENSIVE METABOLIC PANEL - Abnormal; Notable for the following components:   Sodium 132 (*)    Chloride 97 (*)    Glucose, Bld 121 (*)    Calcium 8.7 (*)    Total Protein 6.3 (*)    All other components within normal limits  D-DIMER, QUANTITATIVE - Abnormal; Notable for the following components:   D-Dimer, Quant 1.53 (*)    All other components within normal limits  RESP PANEL BY RT-PCR (FLU A&B, COVID) ARPGX2  AMMONIA  TSH  LIPASE, BLOOD  URINALYSIS, ROUTINE W REFLEX MICROSCOPIC  TROPONIN I (HIGH SENSITIVITY)  TROPONIN I (HIGH SENSITIVITY)    EKG EKG Interpretation  Date/Time:  Thursday July 12 2021 23:26:02 EST Ventricular Rate:  63 PR Interval:  169 QRS Duration: 159 QT Interval:  445 QTC Calculation: 456 R Axis:   -73 Text Interpretation: Sinus rhythm RBBB and LAFB Probable left ventricular hypertrophy No significant change was found Confirmed by Ezequiel Essex [phone]) on 07/12/2021 11:35:37 PM  Radiology CT Head Wo Contrast  Result Date: 07/13/2021 CLINICAL DATA:  Delirium, syncope, vomiting EXAM: CT HEAD WITHOUT CONTRAST TECHNIQUE: Contiguous axial images were obtained from the base of the skull through the vertex without intravenous contrast. COMPARISON:  None. FINDINGS: Brain: Normal anatomic configuration. Parenchymal volume loss is commensurate with the patient's age. Moderate to severe subcortical and periventricular white matter changes are present, stable since prior examination, likely reflecting the sequela of small vessel ischemia. No  abnormal intra or extra-axial mass lesion or fluid collection. No abnormal mass effect or midline shift. No evidence of acute intracranial hemorrhage or infarct. Ventricular size is normal. Cerebellum unremarkable. Vascular: No asymmetric hyperdense vasculature at the skull base. Skull: Intact Sinuses/Orbits: Bilateral maxillary antrostomy has been performed. There is moderate mucosal thickening within the ethmoid air cells bilaterally. No air-fluid levels. Remaining paranasal sinuses are clear. Ocular lenses have been removed. Orbits are unremarkable. Other: Mastoid air cells and middle ear cavities are clear. IMPRESSION: Stable advanced senescent change. No acute intracranial hemorrhage or infarct. Mild paranasal sinus disease. Electronically Signed   By: Fidela Salisbury M.D.   On: 07/13/2021 00:15   CT Angio Chest PE W and/or Wo Contrast  Result Date: 07/13/2021 CLINICAL DATA:  Altered mental status and syncope. EXAM: CT ANGIOGRAPHY CHEST WITH CONTRAST TECHNIQUE: Multidetector CT imaging of the chest was performed using the standard protocol during bolus administration of intravenous contrast. Multiplanar CT image reconstructions and MIPs were obtained to evaluate the vascular anatomy. CONTRAST:  182mL OMNIPAQUE IOHEXOL 350 MG/ML SOLN COMPARISON:  August 30, 2019 FINDINGS: Cardiovascular: There is moderate severity calcification of the aortic arch, without evidence of aortic aneurysm. Satisfactory opacification of the pulmonary arteries to the segmental level. No evidence of pulmonary embolism. Normal heart size. No pericardial effusion. Mediastinum/Nodes: No enlarged mediastinal,  hilar, or axillary lymph nodes. Thyroid gland, trachea, and esophagus demonstrate no significant findings. Lungs/Pleura: Very mild atelectasis is seen along the posterior aspect of the bilateral lower lobes. There is no evidence of acute infiltrate, pleural effusion or pneumothorax. Upper Abdomen: There is a small hiatal hernia.  Musculoskeletal: Multilevel degenerative changes seen throughout the thoracic spine. Review of the MIP images confirms the above findings. IMPRESSION: 1. No evidence of pulmonary embolus or acute cardiopulmonary disease. 2. Small hiatal hernia. 3. Aortic atherosclerosis. Aortic Atherosclerosis (ICD10-I70.0). Electronically Signed   By: Virgina Norfolk M.D.   On: 07/13/2021 01:50   CT ABDOMEN PELVIS W CONTRAST  Result Date: 07/13/2021 CLINICAL DATA:  Altered mental status and syncopal episode. EXAM: CT ABDOMEN AND PELVIS WITH CONTRAST TECHNIQUE: Multidetector CT imaging of the abdomen and pelvis was performed using the standard protocol following bolus administration of intravenous contrast. CONTRAST:  178mL OMNIPAQUE IOHEXOL 350 MG/ML SOLN COMPARISON:  November 05, 2017 FINDINGS: Lower chest: No acute abnormality. Hepatobiliary: No focal liver abnormality is seen. No gallstones, gallbladder wall thickening, or biliary dilatation. Pancreas: Unremarkable. No pancreatic ductal dilatation or surrounding inflammatory changes. Spleen: Normal in size without focal abnormality. Adrenals/Urinary Tract: The right adrenal gland is unremarkable. A stable 1.4 cm x 0.8 cm low-attenuation left adrenal mass is noted. Kidneys are normal, without renal calculi, focal lesion, or hydronephrosis. Bladder is unremarkable. Stomach/Bowel: Stomach is within normal limits. The appendix is not clearly identified. No evidence of bowel wall thickening, distention, or inflammatory changes. Vascular/Lymphatic: Aortic atherosclerosis. No enlarged abdominal or pelvic lymph nodes. Reproductive: Prostate is unremarkable. Other: There is a 3.8 cm x 3.9 cm right-sided fat containing paraumbilical hernia. No abdominopelvic ascites. Musculoskeletal: Multilevel degenerative changes are seen throughout the lumbar spine. IMPRESSION: 1. Stable low-attenuation left adrenal mass which may represent an adrenal adenoma. 2. Right-sided fat containing  paraumbilical hernia. 3. Aortic atherosclerosis. Aortic Atherosclerosis (ICD10-I70.0). Electronically Signed   By: Virgina Norfolk M.D.   On: 07/13/2021 01:48   DG Chest Portable 1 View  Result Date: 07/13/2021 CLINICAL DATA:  Shortness of breath and hypoxia with COVID-19 positivity, initial encounter EXAM: PORTABLE CHEST 1 VIEW COMPARISON:  08/30/2019 FINDINGS: Cardiac shadow is within normal limits. Aortic calcifications are noted. The lungs are well aerated bilaterally. No focal infiltrate or sizable effusion is seen. No bony abnormality is noted. IMPRESSION: No active disease. Electronically Signed   By: Inez Catalina M.D.   On: 07/13/2021 00:13    Procedures Procedures   Medications Ordered in ED Medications - No data to display  ED Course  I have reviewed the triage vital signs and the nursing notes.  Pertinent labs & imaging results that were available during my care of the patient were reviewed by me and considered in my medical decision making (see chart for details).    MDM Rules/Calculators/A&P                         Recent URI symptoms with cough for the past several days.  Possible syncopal episode after receiving Benadryl at home for itching.  On arrival EKG is sinus rhythm.  Nonfocal neurological exam.  Per chart review, patient was started on Augmentin and methylprednisolone Dosepak  Chest x-ray shows no pneumonia.  Patient does have new oxygen requirement.  Will rule out pulmonary embolism.  CT head is stable.  Labs otherwise unremarkable. CTPE negative  CT abdomen pelvis shows no acute findings.  Unclear etiology of patient's hypoxia as well as  possible syncopal episode.  EKG without prolonged QT or Brugada.  Unclear etiology of episode at home.  Possible syncopal episode or possible seizure.  Appears back to baseline now.  May be related to Benadryl use. No evidence of anaphylaxis.  Troponin negative x2 without evidence of ACS.  Observation admission for syncope  and hypoxia d/w Dr. Clearence Ped.    Final Clinical Impression(s) / ED Diagnoses Final diagnoses:  Syncope and collapse  Hypoxia    Rx / DC Orders ED Discharge Orders     None        Latasha Puskas, Annie Main, MD 07/13/21 1040    Ezequiel Essex, MD 07/13/21 [phone]

## 2021-07-12 NOTE — ED Triage Notes (Addendum)
Pt tested positive for covid approx a week ago, was placed on antibiotics. EMS was called out by the son-in-law for AMS and a possible syncopal episode. Upon EMS pt was sitting in the floor vomiting.    Pt alert and oriented in triage.  CBG 131

## 2021-07-12 NOTE — ED Notes (Signed)
ED Provider at bedside. 

## 2021-07-13 ENCOUNTER — Emergency Department (HOSPITAL_COMMUNITY): Payer: Medicare Other

## 2021-07-13 ENCOUNTER — Other Ambulatory Visit (HOSPITAL_COMMUNITY): Payer: Medicare Other

## 2021-07-13 DIAGNOSIS — K429 Umbilical hernia without obstruction or gangrene: Secondary | ICD-10-CM | POA: Diagnosis not present

## 2021-07-13 DIAGNOSIS — U071 COVID-19: Secondary | ICD-10-CM | POA: Diagnosis not present

## 2021-07-13 DIAGNOSIS — K449 Diaphragmatic hernia without obstruction or gangrene: Secondary | ICD-10-CM | POA: Diagnosis not present

## 2021-07-13 DIAGNOSIS — J9811 Atelectasis: Secondary | ICD-10-CM | POA: Diagnosis not present

## 2021-07-13 DIAGNOSIS — R0602 Shortness of breath: Secondary | ICD-10-CM | POA: Diagnosis not present

## 2021-07-13 DIAGNOSIS — I700 Atherosclerosis of aorta: Secondary | ICD-10-CM | POA: Diagnosis not present

## 2021-07-13 DIAGNOSIS — M47816 Spondylosis without myelopathy or radiculopathy, lumbar region: Secondary | ICD-10-CM | POA: Diagnosis not present

## 2021-07-13 DIAGNOSIS — R55 Syncope and collapse: Secondary | ICD-10-CM

## 2021-07-13 DIAGNOSIS — R0902 Hypoxemia: Secondary | ICD-10-CM | POA: Diagnosis not present

## 2021-07-13 LAB — COMPREHENSIVE METABOLIC PANEL
ALT: 13 U/L (ref 0–44)
ALT: 14 U/L (ref 0–44)
AST: 17 U/L (ref 15–41)
AST: 18 U/L (ref 15–41)
Albumin: 3.7 g/dL (ref 3.5–5.0)
Albumin: 3.7 g/dL (ref 3.5–5.0)
Alkaline Phosphatase: 52 U/L (ref 38–126)
Alkaline Phosphatase: 54 U/L (ref 38–126)
Anion gap: 9 (ref 5–15)
Anion gap: 9 (ref 5–15)
BUN: 12 mg/dL (ref 8–23)
BUN: 15 mg/dL (ref 8–23)
CO2: 26 mmol/L (ref 22–32)
CO2: 26 mmol/L (ref 22–32)
Calcium: 8.7 mg/dL — ABNORMAL LOW (ref 8.9–10.3)
Calcium: 8.8 mg/dL — ABNORMAL LOW (ref 8.9–10.3)
Chloride: 96 mmol/L — ABNORMAL LOW (ref 98–111)
Chloride: 97 mmol/L — ABNORMAL LOW (ref 98–111)
Creatinine, Ser: 1.04 mg/dL (ref 0.61–1.24)
Creatinine, Ser: 1.11 mg/dL (ref 0.61–1.24)
GFR, Estimated: >60 mL/min (ref >60)
GFR, Estimated: >60 mL/min (ref >60)
Glucose, Bld: 121 mg/dL — ABNORMAL HIGH (ref 70–99)
Glucose, Bld: 136 mg/dL — ABNORMAL HIGH (ref 70–99)
Potassium: 4.6 mmol/L (ref 3.5–5.1)
Potassium: 4.6 mmol/L (ref 3.5–5.1)
Sodium: 131 mmol/L — ABNORMAL LOW (ref 135–145)
Sodium: 132 mmol/L — ABNORMAL LOW (ref 135–145)
Total Bilirubin: 0.4 mg/dL (ref 0.3–1.2)
Total Bilirubin: 0.8 mg/dL (ref 0.3–1.2)
Total Protein: 6.3 g/dL — ABNORMAL LOW (ref 6.5–8.1)
Total Protein: 6.3 g/dL — ABNORMAL LOW (ref 6.5–8.1)

## 2021-07-13 LAB — URINALYSIS, MICROSCOPIC (REFLEX)

## 2021-07-13 LAB — CBC WITH DIFFERENTIAL/PLATELET
Abs Immature Granulocytes: 0.03 10*3/uL (ref 0.00–0.07)
Abs Immature Granulocytes: 0.03 10*3/uL (ref 0.00–0.07)
Basophils Absolute: 0 10*3/uL (ref 0.0–0.1)
Basophils Absolute: 0 10*3/uL (ref 0.0–0.1)
Basophils Relative: 0 %
Basophils Relative: 0 %
Eosinophils Absolute: 0.1 10*3/uL (ref 0.0–0.5)
Eosinophils Absolute: 0.1 10*3/uL (ref 0.0–0.5)
Eosinophils Relative: 0 %
Eosinophils Relative: 1 %
HCT: 37.9 % — ABNORMAL LOW (ref 39.0–52.0)
HCT: 39.6 % (ref 39.0–52.0)
Hemoglobin: 12.9 g/dL — ABNORMAL LOW (ref 13.0–17.0)
Hemoglobin: 13.4 g/dL (ref 13.0–17.0)
Immature Granulocytes: 0 %
Immature Granulocytes: 0 %
Lymphocytes Relative: 4 %
Lymphocytes Relative: 7 %
Lymphs Abs: 0.5 10*3/uL — ABNORMAL LOW (ref 0.7–4.0)
Lymphs Abs: 0.7 10*3/uL (ref 0.7–4.0)
MCH: 33.2 pg (ref 26.0–34.0)
MCH: 33.3 pg (ref 26.0–34.0)
MCHC: 33.8 g/dL (ref 30.0–36.0)
MCHC: 34 g/dL (ref 30.0–36.0)
MCV: 97.4 fL (ref 80.0–100.0)
MCV: 98.5 fL (ref 80.0–100.0)
Monocytes Absolute: 0.9 10*3/uL (ref 0.1–1.0)
Monocytes Absolute: 1 10*3/uL (ref 0.1–1.0)
Monocytes Relative: 8 %
Monocytes Relative: 8 %
Neutro Abs: 11.1 10*3/uL — ABNORMAL HIGH (ref 1.7–7.7)
Neutro Abs: 9 10*3/uL — ABNORMAL HIGH (ref 1.7–7.7)
Neutrophils Relative %: 84 %
Neutrophils Relative %: 88 %
Platelets: 248 10*3/uL (ref 150–400)
Platelets: 257 10*3/uL (ref 150–400)
RBC: 3.89 MIL/uL — ABNORMAL LOW (ref 4.22–5.81)
RBC: 4.02 MIL/uL — ABNORMAL LOW (ref 4.22–5.81)
RDW: 13 % (ref 11.5–15.5)
RDW: 13.2 % (ref 11.5–15.5)
WBC: 10.7 10*3/uL — ABNORMAL HIGH (ref 4.0–10.5)
WBC: 12.6 10*3/uL — ABNORMAL HIGH (ref 4.0–10.5)
nRBC: 0 % (ref 0.0–0.2)
nRBC: 0 % (ref 0.0–0.2)

## 2021-07-13 LAB — MAGNESIUM: Magnesium: 2.1 mg/dL (ref 1.7–2.4)

## 2021-07-13 LAB — URINALYSIS, ROUTINE W REFLEX MICROSCOPIC
Bilirubin Urine: NEGATIVE
Glucose, UA: NEGATIVE mg/dL
Hgb urine dipstick: NEGATIVE
Ketones, ur: NEGATIVE mg/dL
Leukocytes,Ua: NEGATIVE
Nitrite: NEGATIVE
Specific Gravity, Urine: <1.005 — ABNORMAL LOW (ref 1.005–1.030)
pH: 5.5 (ref 5.0–8.0)

## 2021-07-13 LAB — BLOOD GAS, VENOUS
Acid-Base Excess: 3.7 mmol/L — ABNORMAL HIGH (ref 0.0–2.0)
Bicarbonate: 25.9 mmol/L (ref 20.0–28.0)
FIO2: 28
O2 Saturation: 52.2 %
Patient temperature: 37.1
pCO2, Ven: 53.6 mmHg (ref 44.0–60.0)
pH, Ven: 7.351 (ref 7.250–7.430)
pO2, Ven: 32.4 mmHg (ref 32.0–45.0)

## 2021-07-13 LAB — TSH
TSH: 3.902 u[IU]/mL (ref 0.350–4.500)
TSH: 1.802 u[IU]/mL (ref 0.350–4.500)

## 2021-07-13 LAB — RESP PANEL BY RT-PCR (FLU A&B, COVID) ARPGX2
Influenza A by PCR: NEGATIVE
Influenza B by PCR: NEGATIVE
SARS Coronavirus 2 by RT PCR: NEGATIVE

## 2021-07-13 LAB — TROPONIN I (HIGH SENSITIVITY)
Troponin I (High Sensitivity): 3 ng/L (ref <18)
Troponin I (High Sensitivity): 4 ng/L (ref <18)

## 2021-07-13 LAB — AMMONIA: Ammonia: 14 umol/L (ref 9–35)

## 2021-07-13 LAB — LIPASE, BLOOD: Lipase: 26 U/L (ref 11–51)

## 2021-07-13 LAB — D-DIMER, QUANTITATIVE: D-Dimer, Quant: 1.53 ug/mL-FEU — ABNORMAL HIGH (ref 0.00–0.50)

## 2021-07-13 MED ORDER — FLUTICASONE FUROATE-VILANTEROL 100-25 MCG/ACT IN AEPB: 1.0000 | INHALATION_SPRAY | Freq: Every day | RESPIRATORY_TRACT | Status: DC

## 2021-07-13 MED ORDER — HEPARIN SODIUM (PORCINE) 5000 UNIT/ML IJ SOLN
5000.0000 [IU] | Freq: Three times a day (TID) | INTRAMUSCULAR | Status: DC
  Administered 2021-07-13: 06:00:00 5000 [IU] via SUBCUTANEOUS
  Filled 2021-07-13: qty 1

## 2021-07-13 MED ORDER — LOSARTAN POTASSIUM 25 MG PO TABS: 50.0000 mg | ORAL_TABLET | Freq: Every day | ORAL | Status: DC

## 2021-07-13 MED ORDER — IOHEXOL 350 MG/ML SOLN
100.0000 mL | Freq: Once | INTRAVENOUS | Status: AC | PRN
  Administered 2021-07-13: 01:00:00 100 mL via INTRAVENOUS

## 2021-07-13 MED ORDER — ASPIRIN EC 81 MG PO TBEC
81.0000 mg | DELAYED_RELEASE_TABLET | Freq: Every day | ORAL | Status: DC
  Administered 2021-07-13: 09:00:00 81 mg via ORAL
  Filled 2021-07-13: qty 1

## 2021-07-13 MED ORDER — IPRATROPIUM-ALBUTEROL 0.5-2.5 (3) MG/3ML IN SOLN: 3.0000 mL | RESPIRATORY_TRACT | Status: DC | PRN

## 2021-07-13 MED ORDER — SODIUM CHLORIDE 0.9% FLUSH
3.0000 mL | Freq: Two times a day (BID) | INTRAVENOUS | Status: DC
  Administered 2021-07-13: 09:00:00 3 mL via INTRAVENOUS

## 2021-07-13 MED ORDER — CITALOPRAM HYDROBROMIDE 20 MG PO TABS: 20.0000 mg | ORAL_TABLET | Freq: Every day | ORAL | Status: DC

## 2021-07-13 MED ORDER — SODIUM CHLORIDE 0.9 % IV SOLN: INTRAVENOUS | Status: DC

## 2021-07-13 MED ORDER — ONDANSETRON HCL 4 MG PO TABS: 4.0000 mg | ORAL_TABLET | Freq: Four times a day (QID) | ORAL | Status: DC | PRN

## 2021-07-13 MED ORDER — LOSARTAN POTASSIUM 50 MG PO TABS: 25.0000 mg | ORAL_TABLET | Freq: Every day | ORAL | 2 refills | Status: DC

## 2021-07-13 MED ORDER — LEVOTHYROXINE SODIUM 50 MCG PO TABS
75.0000 ug | ORAL_TABLET | Freq: Every day | ORAL | Status: DC
  Administered 2021-07-13: 06:00:00 75 ug via ORAL
  Filled 2021-07-13: qty 2

## 2021-07-13 MED ORDER — TRAMADOL HCL 50 MG PO TABS: 50.0000 mg | ORAL_TABLET | Freq: Four times a day (QID) | ORAL | Status: DC | PRN

## 2021-07-13 MED ORDER — GABAPENTIN 300 MG PO CAPS
300.0000 mg | ORAL_CAPSULE | Freq: Three times a day (TID) | ORAL | Status: DC
  Administered 2021-07-13: 09:00:00 300 mg via ORAL
  Filled 2021-07-13: qty 1

## 2021-07-13 MED ORDER — PRAVASTATIN SODIUM 10 MG PO TABS: 20.0000 mg | ORAL_TABLET | Freq: Every day | ORAL | Status: DC

## 2021-07-13 MED ORDER — ALBUTEROL SULFATE (2.5 MG/3ML) 0.083% IN NEBU: 2.5000 mg | INHALATION_SOLUTION | RESPIRATORY_TRACT | Status: DC | PRN

## 2021-07-13 MED ORDER — ACETAMINOPHEN 650 MG RE SUPP: 650.0000 mg | Freq: Four times a day (QID) | RECTAL | Status: DC | PRN

## 2021-07-13 MED ORDER — ACETAMINOPHEN 325 MG PO TABS: 650.0000 mg | ORAL_TABLET | Freq: Four times a day (QID) | ORAL | Status: DC | PRN

## 2021-07-13 MED ORDER — AMLODIPINE BESYLATE 5 MG PO TABS
2.5000 mg | ORAL_TABLET | Freq: Every day | ORAL | Status: DC
  Administered 2021-07-13: 09:00:00 2.5 mg via ORAL
  Filled 2021-07-13: qty 1

## 2021-07-13 MED ORDER — ONDANSETRON HCL 4 MG/2ML IJ SOLN: 4.0000 mg | Freq: Four times a day (QID) | INTRAMUSCULAR | Status: DC | PRN

## 2021-07-13 MED ORDER — UMECLIDINIUM BROMIDE 62.5 MCG/ACT IN AEPB: 1.0000 | INHALATION_SPRAY | Freq: Every day | RESPIRATORY_TRACT | Status: DC

## 2021-07-13 MED ORDER — FLUTICASONE-UMECLIDIN-VILANT 100-62.5-25 MCG/ACT IN AEPB: 1.0000 | INHALATION_SPRAY | Freq: Every day | RESPIRATORY_TRACT | Status: DC

## 2021-07-13 NOTE — Evaluation (Signed)
Occupational Therapy Evaluation Patient Details Name: Cristian Yang MRN: [id number] DOB: Aug 02, 1934 Today's Date: 07/13/2021   History of Present Illness Cristian Yang  is a 86 y.o. male, with history of COPD, GERD, hyperlipidemia, hypertension, hypothyroidism, sleep apnea, presents the ED with a chief complaint of fall.  Syncope.  Patient reports he started coughing about 2 days ago.  It is productive of yellow-green sputum.  He had worse 1 day ago.  He denies fevers and nausea and vomiting.  He has had no sick contacts.  He called his PCP who gave him Augmentin and methylprednisolone he took the Augmentin and started having red itchy arms per his report.  His feet were also itching.  His arm swelling they were burning and he was using washcloths to try to cool him.  He took Benadryl and had minimal improvement.  He then bent over to pick something up and then woke up on the floor.  He remembers feeling itchy and cold upon waking.  He had rigors and felt cold on the EMS.  Family noted to ED provider that there was an extended period of time where he was not back to his baseline mentation.  There was no incontinence, and he did not bite his tongue.  When patient arrived he was oriented x3.   Clinical Impression   Pt appeared to be at or near baseline levels for OT/PT co-evaluation. Pt functionally ambulated and completed lower body dressing with modified independence. Pt is not recommended for further acute OT services and will be discharged to care of nursing staff for remaining length of stay.      Recommendations for follow up therapy are one component of a multi-disciplinary discharge planning process, led by the attending physician.  Recommendations may be updated based on patient status, additional functional criteria and insurance authorization.   Follow Up Recommendations  No OT follow up    Assistance Recommended at Discharge None  Functional Status Assessment  Patient has not had a  recent decline in their functional status  Equipment Recommendations  None recommended by OT    Recommendations for Other Services       Precautions / Restrictions Precautions Precautions: None Restrictions Weight Bearing Restrictions: No      Mobility Bed Mobility Overal bed mobility: Modified Independent                  Transfers Overall transfer level: Modified independent                        Balance Overall balance assessment: No apparent balance deficits (not formally assessed)                                         ADL either performed or assessed with clinical judgement   ADL Overall ADL's : Modified independent                                             Vision Baseline Vision/History: 0 No visual deficits Ability to See in Adequate Light: 0 Adequate Patient Visual Report: No change from baseline Vision Assessment?: No apparent visual deficits Additional Comments: Per observation.     Perception     Praxis      Pertinent Vitals/Pain  Pain Assessment: No/denies pain     Hand Dominance Right   Extremity/Trunk Assessment Upper Extremity Assessment Upper Extremity Assessment: Overall WFL for tasks assessed   Lower Extremity Assessment Lower Extremity Assessment: Defer to PT evaluation   Cervical / Trunk Assessment Cervical / Trunk Assessment: Normal   Communication Communication Communication: No difficulties   Cognition Arousal/Alertness: Awake/alert Behavior During Therapy: WFL for tasks assessed/performed Overall Cognitive Status: Within Functional Limits for tasks assessed                                                        Home Living Family/patient expects to be discharged to:: Private residence Living Arrangements: Spouse/significant other Available Help at Discharge: Family;Available PRN/intermittently Type of Home: Mobile home Home Access: Ramped  entrance     Home Layout: One level     Bathroom Shower/Tub: Teacher, early years/pre: Standard Bathroom Accessibility: Yes   Home Equipment: Rollator (4 wheels);BSC/3in1;Shower seat          Prior Functioning/Environment Prior Level of Function : Independent/Modified Independent             Mobility Comments: Hydrographic surveyor, drives ADLs Comments: Independent                      OT Goals(Current goals can be found in the care plan section) Acute Rehab OT Goals Patient Stated Goal: return home                   Co-evaluation PT/OT/SLP Co-Evaluation/Treatment: Yes Reason for Co-Treatment: To address functional/ADL transfers PT goals addressed during session: Mobility/safety with mobility;Balance;Proper use of DME OT goals addressed during session: ADL's and self-care      AM-PAC OT "6 Clicks" Daily Activity     Outcome Measure Help from another person eating meals?: None Help from another person taking care of personal grooming?: None Help from another person toileting, which includes using toliet, bedpan, or urinal?: None Help from another person bathing (including washing, rinsing, drying)?: None Help from another person to put on and taking off regular upper body clothing?: None Help from another person to put on and taking off regular lower body clothing?: None 6 Click Score: 24   End of Session Equipment Utilized During Treatment:  (Taken off oxygen.)  Activity Tolerance: Patient tolerated treatment well Patient left: in bed;with call bell/phone within reach  OT Visit Diagnosis: Unsteadiness on feet (R26.81);Other abnormalities of gait and mobility (R26.89)                Time: 3382-5053 OT Time Calculation (min): 15 min Charges:  OT General Charges $OT Visit: 1 Visit OT Evaluation $OT Eval Low Complexity: 1 Low  Cassidy Tashiro OT, MOT  Larey Seat 07/13/2021, 12:06 PM

## 2021-07-13 NOTE — Evaluation (Signed)
Physical Therapy Evaluation Patient Details Name: Cristian Yang MRN: [id number] DOB: 1934-08-09 Today's Date: 07/13/2021  History of Present Illness  Cristian Yang  is a 86 y.o. male, with history of COPD, GERD, hyperlipidemia, hypertension, hypothyroidism, sleep apnea, presents the ED with a chief complaint of fall.  Syncope.  Patient reports he started coughing about 2 days ago.  It is productive of yellow-green sputum.  He had worse 1 day ago.  He denies fevers and nausea and vomiting.  He has had no sick contacts.  He called his PCP who gave him Augmentin and methylprednisolone he took the Augmentin and started having red itchy arms per his report.  His feet were also itching.  His arm swelling they were burning and he was using washcloths to try to cool him.  He took Benadryl and had minimal improvement.  He then bent over to pick something up and then woke up on the floor.  He remembers feeling itchy and cold upon waking.  He had rigors and felt cold on the EMS.  Family noted to ED provider that there was an extended period of time where he was not back to his baseline mentation.  There was no incontinence, and he did not bite his tongue.  When patient arrived he was oriented x3.   Clinical Impression  Patient functioning at baseline for functional mobility and gait demonstrating good return for ambulation in room and hallways without loss of balance.  Plan:  Patient discharged from physical therapy to care of nursing for ambulation daily as tolerated for length of stay.         Recommendations for follow up therapy are one component of a multi-disciplinary discharge planning process, led by the attending physician.  Recommendations may be updated based on patient status, additional functional criteria and insurance authorization.  Follow Up Recommendations No PT follow up    Assistance Recommended at Discharge PRN  Functional Status Assessment Patient has not had a recent decline in their  functional status  Equipment Recommendations  None recommended by PT    Recommendations for Other Services       Precautions / Restrictions Precautions Precautions: None Restrictions Weight Bearing Restrictions: No      Mobility  Bed Mobility Overal bed mobility: Modified Independent                  Transfers Overall transfer level: Modified independent                      Ambulation/Gait Ambulation/Gait assistance: Modified independent (Device/Increase time) Gait Distance (Feet): 120 Feet Assistive device: None Gait Pattern/deviations: WFL(Within Functional Limits) Gait velocity: decreased     General Gait Details: grossly WFL with good return for ambulaiton in room and hallways without loss of balance  Stairs            Wheelchair Mobility    Modified Rankin (Stroke Patients Only)       Balance Overall balance assessment: No apparent balance deficits (not formally assessed)                                           Pertinent Vitals/Pain Pain Assessment: No/denies pain    Home Living Family/patient expects to be discharged to:: Private residence Living Arrangements: Spouse/significant other Available Help at Discharge: Family;Available PRN/intermittently Type of Home: Mobile home Home Access: Ramped entrance  Home Layout: One level Home Equipment: Rollator (4 wheels);BSC/3in1;Shower seat      Prior Function Prior Level of Function : Independent/Modified Independent             Mobility Comments: Hydrographic surveyor, drives ADLs Comments: Independent     Hand Dominance   Dominant Hand: Right    Extremity/Trunk Assessment   Upper Extremity Assessment Upper Extremity Assessment: Defer to OT evaluation    Lower Extremity Assessment Lower Extremity Assessment: Overall WFL for tasks assessed    Cervical / Trunk Assessment Cervical / Trunk Assessment: Normal  Communication    Communication: No difficulties  Cognition Arousal/Alertness: Awake/alert Behavior During Therapy: WFL for tasks assessed/performed Overall Cognitive Status: Within Functional Limits for tasks assessed                                          General Comments      Exercises     Assessment/Plan    PT Assessment Patient does not need any further PT services  PT Problem List         PT Treatment Interventions      PT Goals (Current goals can be found in the Care Plan section)  Acute Rehab PT Goals Patient Stated Goal: return home with family to assist PT Goal Formulation: With patient Time For Goal Achievement: 07/13/21 Potential to Achieve Goals: Good    Frequency     Barriers to discharge        Co-evaluation PT/OT/SLP Co-Evaluation/Treatment: Yes Reason for Co-Treatment: To address functional/ADL transfers PT goals addressed during session: Mobility/safety with mobility;Balance;Proper use of DME OT goals addressed during session: ADL's and self-care       AM-PAC PT "6 Clicks" Mobility  Outcome Measure Help needed turning from your back to your side while in a flat bed without using bedrails?: None Help needed moving from lying on your back to sitting on the side of a flat bed without using bedrails?: None Help needed moving to and from a bed to a chair (including a wheelchair)?: None Help needed standing up from a chair using your arms (e.g., wheelchair or bedside chair)?: None Help needed to walk in hospital room?: None Help needed climbing 3-5 steps with a railing? : None 6 Click Score: 24    End of Session   Activity Tolerance: Patient tolerated treatment well Patient left: in bed Nurse Communication: Mobility status PT Visit Diagnosis: Unsteadiness on feet (R26.81);Other abnormalities of gait and mobility (R26.89);Muscle weakness (generalized) (M62.81)    Time: 0762-2633 PT Time Calculation (min) (ACUTE ONLY): 20 min   Charges:   PT  Evaluation $PT Eval Moderate Complexity: 1 Mod PT Treatments $Therapeutic Activity: 8-22 mins        11:27 AM, 07/13/21 Lonell Grandchild, MPT Physical Therapist with [facility] [phone] office [phone] mobile phone

## 2021-07-13 NOTE — Discharge Instructions (Signed)
IMPORTANT INFORMATION: PAY CLOSE ATTENTION   PHYSICIAN DISCHARGE INSTRUCTIONS  Follow with Primary care provider  Pablo Lawrence, NP  and other consultants as instructed by your Hospitalist Physician  [place] IF SYMPTOMS COME BACK, WORSEN OR NEW PROBLEM DEVELOPS   Please note: You were cared for by a hospitalist during your hospital stay. Every effort will be made to forward records to your primary care provider.  You can request that your primary care provider send for your hospital records if they have not received them.  Once you are discharged, your primary care physician will handle any further medical issues. Please note that NO REFILLS for any discharge medications will be authorized once you are discharged, as it is imperative that you return to your primary care physician (or establish a relationship with a primary care physician if you do not have one) for your post hospital discharge needs so that they can reassess your need for medications and monitor your lab values.  Please get a complete blood count and chemistry panel checked by your Primary MD at your next visit, and again as instructed by your Primary MD.  Get Medicines reviewed and adjusted: Please take all your medications with you for your next visit with your Primary MD  Laboratory/radiological data: Please request your Primary MD to go over all hospital tests and procedure/radiological results at the follow up, please ask your primary care provider to get all Hospital records sent to his/her office.  In some cases, they will be blood work, cultures and biopsy results pending at the time of your discharge. Please request that your primary care provider follow up on these results.  If you are diabetic, please bring your blood sugar readings with you to your follow up appointment with primary care.    Please call and make your follow up appointments as soon as possible.    Also Note  the following: If you experience worsening of your admission symptoms, develop shortness of breath, life threatening emergency, suicidal or homicidal thoughts you must seek medical attention immediately by calling 911 or calling your MD immediately  if symptoms less severe.  You must read complete instructions/literature along with all the possible adverse reactions/side effects for all the Medicines you take and that have been prescribed to you. Take any new Medicines after you have completely understood and accpet all the possible adverse reactions/side effects.   Do not drive when taking Pain medications or sleeping medications (Benzodiazepines)  Do not take more than prescribed Pain, Sleep and Anxiety Medications. It is not advisable to combine anxiety,sleep and pain medications without talking with your primary care practitioner  Special Instructions: If you have smoked or chewed Tobacco  in the last 2 yrs please stop smoking, stop any regular Alcohol  and or any Recreational drug use.  Wear Seat belts while driving.  Do not drive if taking any narcotic, mind altering or controlled substances or recreational drugs or alcohol.

## 2021-07-13 NOTE — H&P (Signed)
TRH H&P    Patient Demographics:    Cristian Yang, is a 86 y.o. male  MRN: [id number]  DOB - 10-06-1934  Admit Date - 07/12/2021  Referring MD/NP/PA: Rancour  Outpatient Primary MD for the patient is Pablo Lawrence, NP  Patient coming from: Home  Chief complaint- cough and syncope   HPI:    Cristian Yang  is a 86 y.o. male, with history of COPD, GERD, hyperlipidemia, hypertension, hypothyroidism, sleep apnea, presents the ED with a chief complaint of fall.  Syncope.  Patient reports he started coughing about 2 days ago.  It is productive of yellow-green sputum.  He had worse 1 day ago.  He denies fevers and nausea and vomiting.  He has had no sick contacts.  He called his PCP who gave him Augmentin and methylprednisolone he took the Augmentin and started having red itchy arms per his report.  His feet were also itching.  His arm swelling they were burning and he was using washcloths to try to cool him.  He took Benadryl and had minimal improvement.  He then bent over to pick something up and then woke up on the floor.  He remembers feeling itchy and cold upon waking.  He had rigors and felt cold on the EMS.  Family noted to ED provider that there was an extended period of time where he was not back to his baseline mentation.  There was no incontinence, and he did not bite his tongue.  When patient arrived he was oriented x3.  Of note patient does wear 2 L nasal cannula at night at home.  He also uses CPAP at home.  He has been using breathing treatments twice a day instead of 1 time a day recently and they have not been improving his symptoms.  Patient does not smoke, does not drink, does not use illicit drugs.  He is not vaccinated for COVID.  Patient is full code.  In the ED Temp 98.8, heart rate 57-64, respiratory rate 16-22, blood pressure 117/66, satting 95% on 2 L nasal cannula Patient does wear 2 L nasal  cannula at home at night No leukocytosis, hemoglobin stable Chemistry panel is unremarkable aside from a borderline hypocalcemia D-dimer is elevated at 1.53, CTA shows no PE TSH is 3.902 Negative flu and COVID Chest x-ray shows no acute disease EKG shows sinus bradycardia    Review of systems:    In addition to the HPI above,  Admits to chills No Headache, No changes with Vision or hearing, No problems swallowing food or Liquids, No Chest pain, admits to cough  no Abdominal pain, No Nausea or Vomiting, bowel movements are regular, No Blood in stool or Urine, No dysuria, No new skin rashes or bruises, No new joints pains-aches,  No new weakness, tingling, numbness in any extremity, No recent weight gain or loss, No polyuria, polydypsia or polyphagia, No significant Mental Stressors.  All other systems reviewed and are negative.    Past History of the following :    Past Medical History:  Diagnosis  Date   Arthritis    Asthma    Chronic back pain    COPD (chronic obstructive pulmonary disease) (HCC)    Depression    GERD (gastroesophageal reflux disease)    Hyperlipidemia    Hypertension    Hypothyroidism    OSA on CPAP    Skin cancer    Sleep apnea    uses CPAP nightly      Past Surgical History:  Procedure Laterality Date   CATARACT EXTRACTION W/PHACO Left 02/13/2016   Procedure: CATARACT EXTRACTION PHACO AND INTRAOCULAR LENS PLACEMENT ([place]);  Surgeon: Rutherford Guys, MD;  Location: [facility];  Service: Ophthalmology;  Laterality: Left;  CDE: 9.23   CATARACT EXTRACTION W/PHACO Right 05/28/2019   Procedure: CATARACT EXTRACTION PHACO AND INTRAOCULAR LENS PLACEMENT ([facility]);  Surgeon: Baruch Goldmann, MD;  Location: [facility];  Service: Ophthalmology;  Laterality: Right;  CDE: 9.51   NECK SURGERY     cervical disc   PROSTATE SURGERY     TURP, laser   SKIN CANCER EXCISION  2013   skin cancer removed     x3      Social History:      Social History   Tobacco Use    Smoking status: Former    Packs/day: 2.00    Years: 30.00    Pack years: 60.00    Types: Cigarettes    Quit date: 07/23/2003    Years since quitting: 17.9   Smokeless tobacco: Never  Substance Use Topics   Alcohol use: No    Alcohol/week: 0.0 standard drinks    Comment: quit 2005       Family History :     Family History  Problem Relation Age of Onset   Diabetes Mother    Heart disease Mother    Liver cancer Brother    Bone cancer Sister    Asthma Father       Home Medications:   Prior to Admission medications   Medication Sig Start Date End Date Taking? Authorizing Provider  albuterol (PROVENTIL) (2.5 MG/3ML) 0.083% nebulizer solution USE (1) IN NEBULIZER EVERY 6 HOURS AS NEEDED FOR SHORTNESS OF BREATH OR WHEEZING. 06/22/21   Chesley Mires, MD  amLODipine (NORVASC) 2.5 MG tablet Take 2.5 mg by mouth daily. 07/13/20   [provider]  amoxicillin-clavulanate (AUGMENTIN) 875-125 MG tablet Take 1 tablet by mouth 2 (two) times daily. 05/29/21   [provider]  aspirin EC 81 MG EC tablet Take 1 tablet (81 mg total) by mouth daily with breakfast. 09/01/19   Roxan Hockey, MD  calcium elemental as carbonate (BARIATRIC TUMS ULTRA) 400 MG chewable tablet Chew 3,000 mg by mouth at bedtime.    [provider]  citalopram (CELEXA) 20 MG tablet Take 1 tablet (20 mg total) by mouth at bedtime. 10/15/18   Bufford Lope, DO  docusate sodium (COLACE) 100 MG capsule TAKE 1 CAPSULE BY MOUTH IN THE MORNING Patient taking differently: Take 100 mg by mouth daily. 01/08/19   Harriet Butte, DO  Fluticasone-Umeclidin-Vilant (TRELEGY ELLIPTA) 100-62.5-25 MCG/INH AEPB Inhale 1 puff into the lungs daily. 02/21/21   Chesley Mires, MD  gabapentin (NEURONTIN) 300 MG capsule TAKE 1 CAPSULE BY MOUTH IN THE MORNING TAKE 2 CAPSULE IN THE EVENING Patient taking differently: Take 300 mg by mouth 3 (three) times daily. TAKE 1 CAPSULE BY MOUTH IN THE MORNING TAKE 2 CAPSULE IN THE EVENING  05/01/18   Harriet Butte, DO  guaiFENesin (MUCINEX) 600 MG 12 hr tablet Take 1  tablet (600 mg total) by mouth 2 (two) times daily. 09/01/19   Roxan Hockey, MD  HYDROcodone-acetaminophen (NORCO/VICODIN) 5-325 MG tablet Take 1 tablet by mouth every 6 (six) hours as needed for moderate pain or severe pain. 08/30/19   Rolland Porter, MD  levothyroxine (SYNTHROID, LEVOTHROID) 75 MCG tablet Take 1 tablet (75 mcg total) by mouth daily before breakfast. 10/15/18   Bufford Lope, DO  lidocaine (XYLOCAINE) 5 % ointment Apply 1 application topically 3 (three) times daily as needed. Apply to affected area 11/21/17   Nuala Alpha, MD  losartan (COZAAR) 50 MG tablet Take 1 tablet (50 mg total) by mouth at bedtime. 09/01/19   Roxan Hockey, MD  meloxicam (MOBIC) 15 MG tablet Take 15 mg by mouth daily. 07/13/20   [provider]  methocarbamol (ROBAXIN) 750 MG tablet Take 750-1,500 mg by mouth 3 (three) times daily as needed for muscle spasms. 05/30/20   [provider]  mometasone (NASONEX) 50 MCG/ACT nasal spray Place 2 sprays into the nose daily. 08/11/20   Chesley Mires, MD  montelukast (SINGULAIR) 10 MG tablet Take 1 tablet (10 mg total) by mouth at bedtime. 04/06/20   Chesley Mires, MD  multivitamin (ONE-A-DAY MEN'S) TABS tablet Take 1 tablet by mouth every morning. 10/15/18   Bufford Lope, DO  omeprazole (PRILOSEC) 20 MG capsule Take 1 capsule (20 mg total) by mouth every morning. 09/01/19   Roxan Hockey, MD  ondansetron (ZOFRAN) 4 MG tablet Take 1 tablet (4 mg total) by mouth as needed for nausea or vomiting. 06/03/21   Azucena Cecil, PA-C  pravastatin (PRAVACHOL) 20 MG tablet Take 20 mg by mouth daily as needed. 05/11/21   [provider]     Allergies:     Allergies  Allergen Reactions   Ibuprofen Hives    Bilateral arm hives after taking ibuprofen   Ciprofloxacin Nausea And Vomiting   Codeine Nausea And Vomiting     Physical Exam:   Vitals  Blood pressure  120/66, pulse 69, temperature 98.8 F (37.1 C), resp. rate (!) 21, height 5\' 8"  (1.727 m), weight 98 kg, SpO2 94 %.   1.  General: Patient lying supine in bed,  no acute distress   2. Psychiatric: Alert and oriented x 3, mood and behavior normal for situation, pleasant and cooperative with exam   3. Neurologic: Speech and language are normal, face is symmetric, moves all 4 extremities voluntarily, at baseline without acute deficits on limited exam   4. HEENMT:  Head is atraumatic, normocephalic, pupils reactive to light, neck is supple, trachea is midline, mucous membranes are moist   5. Respiratory : Lungs are clear to auscultation bilaterally with expiratory wheezing, rhonchi, rales, no cyanosis, no increase in work of breathing or accessory muscle use, 2 L nasal cannula in place   6. Cardiovascular : Heart rate normal, rhythm is regular, no murmurs, rubs or gallops, no peripheral edema, peripheral pulses palpated   7. Gastrointestinal:  Abdomen is soft, nondistended, nontender to palpation bowel sounds active, no masses or organomegaly palpated   8. Skin:  Skin is warm, dry and intact without rashes, acute lesions, or ulcers on limited exam   9.Musculoskeletal:  No acute deformities or trauma, no asymmetry in tone, no peripheral edema, peripheral pulses palpated, no tenderness to palpation in the extremities     Data Review:    CBC Recent Labs  Lab 07/12/21 2350  WBC 10.7*  HGB 13.4  HCT 39.6  PLT 257  MCV 98.5  MCH 33.3  MCHC 33.8  RDW 13.0  LYMPHSABS 0.7  MONOABS 0.9  EOSABS 0.1  BASOSABS 0.0   ------------------------------------------------------------------------------------------------------------------  Results for orders placed or performed during the hospital encounter of 07/12/21 (from the past 48 hour(s))  Resp Panel by RT-PCR (Flu A&B, Covid) Nasopharyngeal Swab     Status: None   Collection Time: 07/12/21 11:45 PM   Specimen: Nasopharyngeal  Swab; Nasopharyngeal(NP) swabs in vial transport medium  Result Value Ref Range   SARS Coronavirus 2 by RT PCR NEGATIVE NEGATIVE    Comment: (NOTE) SARS-CoV-2 target nucleic acids are NOT DETECTED.  The SARS-CoV-2 RNA is generally detectable in upper respiratory specimens during the acute phase of infection. The lowest concentration of SARS-CoV-2 viral copies this assay can detect is 138 copies/mL. A negative result does not preclude SARS-Cov-2 infection and should not be used as the sole basis for treatment or other patient management decisions. A negative result may occur with  improper specimen collection/handling, submission of specimen other than nasopharyngeal swab, presence of viral mutation(s) within the areas targeted by this assay, and inadequate number of viral copies(<138 copies/mL). A negative result must be combined with clinical observations, patient history, and epidemiological information. The expected result is Negative.  Fact Sheet for Patients:  EntrepreneurPulse.com.au  Fact Sheet for Healthcare Providers:  IncredibleEmployment.be  This test is no t yet approved or cleared by the [place] FDA and  has been authorized for detection and/or diagnosis of SARS-CoV-2 by FDA under an Emergency Use Authorization (EUA). This EUA will remain  in effect (meaning this test can be used) for the duration of the COVID-19 declaration under Section 564(b)(1) of the Act, 21 U.S.C.section 360bbb-3(b)(1), unless the authorization is terminated  or revoked sooner.       Influenza A by PCR NEGATIVE NEGATIVE   Influenza B by PCR NEGATIVE NEGATIVE    Comment: (NOTE) The Xpert Xpress SARS-CoV-2/FLU/RSV plus assay is intended as an aid in the diagnosis of influenza from Nasopharyngeal swab specimens and should not be used as a sole basis for treatment. Nasal washings and aspirates are unacceptable for Xpert Xpress  SARS-CoV-2/FLU/RSV testing.  Fact Sheet for Patients: EntrepreneurPulse.com.au  Fact Sheet for Healthcare Providers: IncredibleEmployment.be  This test is not yet approved or cleared by the [place] FDA and has been authorized for detection and/or diagnosis of SARS-CoV-2 by FDA under an Emergency Use Authorization (EUA). This EUA will remain in effect (meaning this test can be used) for the duration of the COVID-19 declaration under Section 564(b)(1) of the Act, 21 U.S.C. section 360bbb-3(b)(1), unless the authorization is terminated or revoked.  Performed at [facility], [address], [place],  [address]   CBC with Differential/Platelet     Status: Abnormal   Collection Time: 07/12/21 11:50 PM  Result Value Ref Range   WBC 10.7 (H) 4.0 - 10.5 K/uL   RBC 4.02 (L) 4.22 - 5.81 MIL/uL   Hemoglobin 13.4 13.0 - 17.0 g/dL   HCT 39.6 39.0 - 52.0 %   MCV 98.5 80.0 - 100.0 fL   MCH 33.3 26.0 - 34.0 pg   MCHC 33.8 30.0 - 36.0 g/dL   RDW 13.0 11.5 - 15.5 %   Platelets 257 150 - 400 K/uL   nRBC 0.0 0.0 - 0.2 %   Neutrophils Relative % 84 %   Neutro Abs 9.0 (H) 1.7 - 7.7 K/uL   Lymphocytes Relative 7 %   Lymphs Abs 0.7 0.7 - 4.0 K/uL  Monocytes Relative 8 %   Monocytes Absolute 0.9 0.1 - 1.0 K/uL   Eosinophils Relative 1 %   Eosinophils Absolute 0.1 0.0 - 0.5 K/uL   Basophils Relative 0 %   Basophils Absolute 0.0 0.0 - 0.1 K/uL   Immature Granulocytes 0 %   Abs Immature Granulocytes 0.03 0.00 - 0.07 K/uL    Comment: Performed at [facility], [address], [place], [place] [address]  Comprehensive metabolic panel     Status: Abnormal   Collection Time: 07/12/21 11:50 PM  Result Value Ref Range   Sodium 132 (L) 135 - 145 mmol/L   Potassium 4.6 3.5 - 5.1 mmol/L   Chloride 97 (L) 98 - 111 mmol/L   CO2 26 22 - 32 mmol/L   Glucose, Bld 121 (H) 70 - 99 mg/dL    Comment: Glucose reference range applies only to samples taken after  fasting for at least 8 hours.   BUN 12 8 - 23 mg/dL   Creatinine, Ser 1.04 0.61 - 1.24 mg/dL   Calcium 8.7 (L) 8.9 - 10.3 mg/dL   Total Protein 6.3 (L) 6.5 - 8.1 g/dL   Albumin 3.7 3.5 - 5.0 g/dL   AST 17 15 - 41 U/L   ALT 13 0 - 44 U/L   Alkaline Phosphatase 54 38 - 126 U/L   Total Bilirubin 0.4 0.3 - 1.2 mg/dL   GFR, Estimated >60 >60 mL/min    Comment: (NOTE) Calculated using the CKD-EPI Creatinine Equation (2021)    Anion gap 9 5 - 15    Comment: Performed at [facility], [address] [place], [place] [address]  Ammonia     Status: None   Collection Time: 07/12/21 11:50 PM  Result Value Ref Range   Ammonia 14 9 - 35 umol/L    Comment: Performed at [facility], [address], [place], [place] [address]  TSH     Status: None   Collection Time: 07/12/21 11:50 PM  Result Value Ref Range   TSH 3.902 0.350 - 4.500 uIU/mL    Comment: Performed by a 3rd Generation assay with a functional sensitivity of <=0.01 uIU/mL. Performed at [facility], [address] [place], [place] [address]   Troponin I (High Sensitivity)     Status: None   Collection Time: 07/12/21 11:50 PM  Result Value Ref Range   Troponin I (High Sensitivity) 3 <18 ng/L    Comment: (NOTE) Elevated high sensitivity troponin I (hsTnI) values and significant  changes across serial measurements may suggest ACS but many other  chronic and acute conditions are known to elevate hsTnI results.  Refer to the "Links" section for chest pain algorithms and additional  guidance. Performed at [facility], [address], [place], [place] [address]   D-dimer, quantitative     Status: Abnormal   Collection Time: 07/12/21 11:50 PM  Result Value Ref Range   D-Dimer, Quant 1.53 (H) 0.00 - 0.50 ug/mL-FEU    Comment: (NOTE) At the manufacturer cut-off value of 0.5 g/mL FEU, this assay has a negative predictive value of 95-100%.This assay is intended for use in conjunction with a clinical pretest probability (PTP)  assessment model to exclude pulmonary embolism (PE) and deep venous thrombosis (DVT) in outpatients suspected of PE or DVT. Results should be correlated with clinical presentation. Performed at [facility], [address], [place], [place] [address]   Lipase, blood     Status: None   Collection Time: 07/12/21 11:50 PM  Result Value  Ref Range   Lipase 26 11 - 51 U/L    Comment: Performed at [facility], [address] [place], [place] [address]  Troponin I (High Sensitivity)     Status: None   Collection Time: 07/13/21  1:47 AM  Result Value Ref Range   Troponin I (High Sensitivity) 4 <18 ng/L    Comment: (NOTE) Elevated high sensitivity troponin I (hsTnI) values and significant  changes across serial measurements may suggest ACS but many other  chronic and acute conditions are known to elevate hsTnI results.  Refer to the "Links" section for chest pain algorithms and additional  guidance. Performed at [facility], [address], [place], [place] [address]     Chemistries  Recent Labs  Lab 07/12/21 2350  NA 132*  K 4.6  CL 97*  CO2 26  GLUCOSE 121*  BUN 12  CREATININE 1.04  CALCIUM 8.7*  AST 17  ALT 13  ALKPHOS 54  BILITOT 0.4   ------------------------------------------------------------------------------------------------------------------  ------------------------------------------------------------------------------------------------------------------ GFR: Estimated Creatinine Clearance: 60 mL/min (by C-G formula based on SCr of 1.04 mg/dL). Liver Function Tests: Recent Labs  Lab 07/12/21 2350  AST 17  ALT 13  ALKPHOS 54  BILITOT 0.4  PROT 6.3*  ALBUMIN 3.7   Recent Labs  Lab 07/12/21 2350  LIPASE 26   Recent Labs  Lab 07/12/21 2350  AMMONIA 14   Coagulation Profile: No results for input(s): INR, PROTIME in the last 168 hours. Cardiac Enzymes: No results for input(s): CKTOTAL, CKMB, CKMBINDEX, TROPONINI in the last 168 hours. BNP (last 3  results) No results for input(s): PROBNP in the last 8760 hours. HbA1C: No results for input(s): HGBA1C in the last 72 hours. CBG: No results for input(s): GLUCAP in the last 168 hours. Lipid Profile: No results for input(s): CHOL, HDL, LDLCALC, TRIG, CHOLHDL, LDLDIRECT in the last 72 hours. Thyroid Function Tests: Recent Labs    07/12/21 2350  TSH 3.902   Anemia Panel: No results for input(s): VITAMINB12, FOLATE, FERRITIN, TIBC, IRON, RETICCTPCT in the last 72 hours.  --------------------------------------------------------------------------------------------------------------- Urine analysis:    Component Value Date/Time   COLORURINE YELLOW 07/09/2018 2114   APPEARANCEUR CLEAR 07/09/2018 2114   LABSPEC 1.018 07/09/2018 2114   PHURINE 6.0 07/09/2018 2114   GLUCOSEU NEGATIVE 07/09/2018 2114   HGBUR NEGATIVE 07/09/2018 2114   [place] NEGATIVE 07/09/2018 2114   [place] NEGATIVE 07/09/2018 2114   PROTEINUR 30 (A) 07/09/2018 2114   NITRITE NEGATIVE 07/09/2018 2114   LEUKOCYTESUR NEGATIVE 07/09/2018 2114      Imaging Results:    CT Head Wo Contrast  Result Date: 07/13/2021 CLINICAL DATA:  Delirium, syncope, vomiting EXAM: CT HEAD WITHOUT CONTRAST TECHNIQUE: Contiguous axial images were obtained from the base of the skull through the vertex without intravenous contrast. COMPARISON:  None. FINDINGS: Brain: Normal anatomic configuration. Parenchymal volume loss is commensurate with the patient's age. Moderate to severe subcortical and periventricular white matter changes are present, stable since prior examination, likely reflecting the sequela of small vessel ischemia. No abnormal intra or extra-axial mass lesion or fluid collection. No abnormal mass effect or midline shift. No evidence of acute intracranial hemorrhage or infarct. Ventricular size is normal. Cerebellum unremarkable. Vascular: No asymmetric hyperdense vasculature at the skull base. Skull: Intact Sinuses/Orbits:  Bilateral maxillary antrostomy has been performed. There is moderate mucosal thickening within the ethmoid air cells bilaterally. No air-fluid levels. Remaining paranasal sinuses are clear. Ocular lenses have been removed. Orbits are unremarkable. Other: Mastoid air cells and middle ear  cavities are clear. IMPRESSION: Stable advanced senescent change. No acute intracranial hemorrhage or infarct. Mild paranasal sinus disease. Electronically Signed   By: Fidela Salisbury M.D.   On: 07/13/2021 00:15   CT Angio Chest PE W and/or Wo Contrast  Result Date: 07/13/2021 CLINICAL DATA:  Altered mental status and syncope. EXAM: CT ANGIOGRAPHY CHEST WITH CONTRAST TECHNIQUE: Multidetector CT imaging of the chest was performed using the standard protocol during bolus administration of intravenous contrast. Multiplanar CT image reconstructions and MIPs were obtained to evaluate the vascular anatomy. CONTRAST:  190mL OMNIPAQUE IOHEXOL 350 MG/ML SOLN COMPARISON:  August 30, 2019 FINDINGS: Cardiovascular: There is moderate severity calcification of the aortic arch, without evidence of aortic aneurysm. Satisfactory opacification of the pulmonary arteries to the segmental level. No evidence of pulmonary embolism. Normal heart size. No pericardial effusion. Mediastinum/Nodes: No enlarged mediastinal, hilar, or axillary lymph nodes. Thyroid gland, trachea, and esophagus demonstrate no significant findings. Lungs/Pleura: Very mild atelectasis is seen along the posterior aspect of the bilateral lower lobes. There is no evidence of acute infiltrate, pleural effusion or pneumothorax. Upper Abdomen: There is a small hiatal hernia. Musculoskeletal: Multilevel degenerative changes seen throughout the thoracic spine. Review of the MIP images confirms the above findings. IMPRESSION: 1. No evidence of pulmonary embolus or acute cardiopulmonary disease. 2. Small hiatal hernia. 3. Aortic atherosclerosis. Aortic Atherosclerosis (ICD10-I70.0).  Electronically Signed   By: Virgina Norfolk M.D.   On: 07/13/2021 01:50   CT ABDOMEN PELVIS W CONTRAST  Result Date: 07/13/2021 CLINICAL DATA:  Altered mental status and syncopal episode. EXAM: CT ABDOMEN AND PELVIS WITH CONTRAST TECHNIQUE: Multidetector CT imaging of the abdomen and pelvis was performed using the standard protocol following bolus administration of intravenous contrast. CONTRAST:  177mL OMNIPAQUE IOHEXOL 350 MG/ML SOLN COMPARISON:  November 05, 2017 FINDINGS: Lower chest: No acute abnormality. Hepatobiliary: No focal liver abnormality is seen. No gallstones, gallbladder wall thickening, or biliary dilatation. Pancreas: Unremarkable. No pancreatic ductal dilatation or surrounding inflammatory changes. Spleen: Normal in size without focal abnormality. Adrenals/Urinary Tract: The right adrenal gland is unremarkable. A stable 1.4 cm x 0.8 cm low-attenuation left adrenal mass is noted. Kidneys are normal, without renal calculi, focal lesion, or hydronephrosis. Bladder is unremarkable. Stomach/Bowel: Stomach is within normal limits. The appendix is not clearly identified. No evidence of bowel wall thickening, distention, or inflammatory changes. Vascular/Lymphatic: Aortic atherosclerosis. No enlarged abdominal or pelvic lymph nodes. Reproductive: Prostate is unremarkable. Other: There is a 3.8 cm x 3.9 cm right-sided fat containing paraumbilical hernia. No abdominopelvic ascites. Musculoskeletal: Multilevel degenerative changes are seen throughout the lumbar spine. IMPRESSION: 1. Stable low-attenuation left adrenal mass which may represent an adrenal adenoma. 2. Right-sided fat containing paraumbilical hernia. 3. Aortic atherosclerosis. Aortic Atherosclerosis (ICD10-I70.0). Electronically Signed   By: Virgina Norfolk M.D.   On: 07/13/2021 01:48   DG Chest Portable 1 View  Result Date: 07/13/2021 CLINICAL DATA:  Shortness of breath and hypoxia with COVID-19 positivity, initial encounter EXAM:  PORTABLE CHEST 1 VIEW COMPARISON:  08/30/2019 FINDINGS: Cardiac shadow is within normal limits. Aortic calcifications are noted. The lungs are well aerated bilaterally. No focal infiltrate or sizable effusion is seen. No bony abnormality is noted. IMPRESSION: No active disease. Electronically Signed   By: Inez Catalina M.D.   On: 07/13/2021 00:13      Assessment & Plan:    Principal Problem:   Syncope and collapse   Syncope and collapse Orthostatic, possibly related to seizure given postictal state Check orthostatic vitals every shift  EEG in the a.m. URI Get a procalcitonin At this time no indication for continued antibiotics since there is no x-ray finding of pneumonia Continue to monitor Acute on chronic respiratory failure Requiring 2 L nasal cannula here Wean off as tolerated DuoNeb as needed Hypertension Continue losartan Hyperlipidemia Continue statin COPD Continue Trelegy   DVT Prophylaxis-   Heparin - SCDs   AM Labs Ordered, also please review Full Orders  Family Communication: No family at bedside  Code Status: Full  Admission status: Observation Disposition: Anticipated Discharge date 24 to 48 hours discharge to home  Time spent in minutes : Merrick DO

## 2021-07-13 NOTE — Discharge Summary (Signed)
Physician Discharge Summary  Cristian Yang MVE:[id number] DOB: 1935-01-08 DOA: 07/12/2021  PCP: Pablo Lawrence, NP  Admit date: 07/12/2021 Discharge date: 07/13/2021  Admitted From:  HOME  Disposition:  HOME    Recommendations for Outpatient Follow-up:  Follow up with PCP in 1 weeks  Discharge Condition: STABLE   CODE STATUS: FULL  DIET:  [place] DIET    Brief Hospitalization Summary: Please see all hospital notes, images, labs for full details of the hospitalization. ADMISSION HPI:  86 y.o. male, with history of COPD, GERD, hyperlipidemia, hypertension, hypothyroidism, sleep apnea, presents the ED with a chief complaint of fall.  Syncope.  Patient reports he started coughing about 2 days ago.  It is productive of yellow-green sputum.  He had worse 1 day ago.  He denies fevers and nausea and vomiting.  He has had no sick contacts.  He called his PCP who gave him Augmentin and methylprednisolone he took the Augmentin and started having red itchy arms per his report.  His feet were also itching.  His arm swelling they were burning and he was using washcloths to try to cool him.  He took Benadryl and had minimal improvement.  He then bent over to pick something up and then woke up on the floor.  He remembers feeling itchy and cold upon waking.  He had rigors and felt cold on the EMS.  Family noted to ED provider that there was an extended period of time where he was not back to his baseline mentation.  There was no incontinence, and he did not bite his tongue.  When patient arrived he was oriented x3.   Of note patient does wear 2 L nasal cannula at night at home.  He also uses CPAP at home.  He has been using breathing treatments twice a day instead of 1 time a day recently and they have not been improving his symptoms.   Patient does not smoke, does not drink, does not use illicit drugs.  He is not vaccinated for COVID.  Patient is full code.   In the ED Temp 98.8, heart rate  57-64, respiratory rate 16-22, blood pressure 117/66, satting 95% on 2 L nasal cannula Patient does wear 2 L nasal cannula at home at night No leukocytosis, hemoglobin stable Chemistry panel is unremarkable aside from a borderline hypocalcemia D-dimer is elevated at 1.53, CTA shows no PE TSH is 3.902 Negative flu and COVID Chest x-ray shows no acute disease EKG shows sinus bradycardia   HOSPITAL COURSE  Patient was admitted to the hospital for observation.  He had an episode where he was bending over picking up something off of the floor where he had a near syncopal episode.  He has no history of epilepsy.  He never completely passed out.  He reports that he was experiencing an allergic reaction after he started taking Augmentin and he was not feeling well.  He has been seen and evaluated in the emergency department and after hydration he is feeling much better.  He would like to go home.  He does not want to stay for further testing.  They had initially ordered an EEG on him but patient declines to have that done saying that he does not have any history of any seizure activity or epilepsy.  He was seen and evaluated by physical therapy and he did very well and there was no additional PT needs identified.  He is feeling much better and stable to discharge home.  We are discharging him from the emergency room.  I have counseled him and he understands when he should return if needed.  He will otherwise follow up with his PCP.     Discharge Diagnoses:  Principal Problem:   Syncope and collapse Discharge Instructions:  Allergies as of 07/13/2021       Reactions   Ibuprofen Hives   Bilateral arm hives after taking ibuprofen   Ciprofloxacin Nausea And Vomiting   Codeine Nausea And Vomiting        Medication List     STOP taking these medications    amoxicillin-clavulanate 875-125 MG tablet Commonly known as: AUGMENTIN   meloxicam 15 MG tablet Commonly known as: MOBIC       TAKE  these medications    albuterol (2.5 MG/3ML) 0.083% nebulizer solution Commonly known as: PROVENTIL USE (1) IN NEBULIZER EVERY 6 HOURS AS NEEDED FOR SHORTNESS OF BREATH OR WHEEZING.   amLODipine 2.5 MG tablet Commonly known as: NORVASC Take 2.5 mg by mouth daily.   aspirin 81 MG EC tablet Take 1 tablet (81 mg total) by mouth daily with breakfast.   calcium elemental as carbonate 400 MG chewable tablet Commonly known as: BARIATRIC TUMS ULTRA Chew 3,000 mg by mouth at bedtime.   citalopram 20 MG tablet Commonly known as: CELEXA Take 1 tablet (20 mg total) by mouth at bedtime.   docusate sodium 100 MG capsule Commonly known as: COLACE TAKE 1 CAPSULE BY MOUTH IN THE MORNING What changed: when to take this   gabapentin 300 MG capsule Commonly known as: NEURONTIN TAKE 1 CAPSULE BY MOUTH IN THE MORNING TAKE 2 CAPSULE IN THE EVENING What changed:  how much to take how to take this when to take this   guaiFENesin 600 MG 12 hr tablet Commonly known as: MUCINEX Take 1 tablet (600 mg total) by mouth 2 (two) times daily.   HYDROcodone-acetaminophen 5-325 MG tablet Commonly known as: NORCO/VICODIN Take 1 tablet by mouth every 6 (six) hours as needed for moderate pain or severe pain.   levothyroxine 75 MCG tablet Commonly known as: SYNTHROID Take 1 tablet (75 mcg total) by mouth daily before breakfast.   lidocaine 5 % ointment Commonly known as: XYLOCAINE Apply 1 application topically 3 (three) times daily as needed. Apply to affected area   losartan 50 MG tablet Commonly known as: COZAAR Take 0.5 tablets (25 mg total) by mouth at bedtime. What changed: how much to take   methocarbamol 750 MG tablet Commonly known as: ROBAXIN Take 750-1,500 mg by mouth 3 (three) times daily as needed for muscle spasms.   mometasone 50 MCG/ACT nasal spray Commonly known as: NASONEX Place 2 sprays into the nose daily.   montelukast 10 MG tablet Commonly known as: SINGULAIR Take 1 tablet  (10 mg total) by mouth at bedtime.   multivitamin Tabs tablet Take 1 tablet by mouth every morning.   omeprazole 20 MG capsule Commonly known as: PRILOSEC Take 1 capsule (20 mg total) by mouth every morning.   ondansetron 4 MG tablet Commonly known as: ZOFRAN Take 1 tablet (4 mg total) by mouth as needed for nausea or vomiting.   pravastatin 20 MG tablet Commonly known as: PRAVACHOL Take 20 mg by mouth daily as needed.   Trelegy Ellipta 100-62.5-25 MCG/ACT Aepb Generic drug: Fluticasone-Umeclidin-Vilant Inhale 1 puff into the lungs daily.        Follow-up Information     Pablo Lawrence, NP. Schedule an appointment as soon as possible for  a visit in 1 week(s).   Specialty: Adult Health Nurse Practitioner Why: Hospital Follow Up Contact information: [place] [address] [phone]                Allergies  Allergen Reactions   Ibuprofen Hives    Bilateral arm hives after taking ibuprofen   Ciprofloxacin Nausea And Vomiting   Codeine Nausea And Vomiting   Allergies as of 07/13/2021       Reactions   Ibuprofen Hives   Bilateral arm hives after taking ibuprofen   Ciprofloxacin Nausea And Vomiting   Codeine Nausea And Vomiting        Medication List     STOP taking these medications    amoxicillin-clavulanate 875-125 MG tablet Commonly known as: AUGMENTIN   meloxicam 15 MG tablet Commonly known as: MOBIC       TAKE these medications    albuterol (2.5 MG/3ML) 0.083% nebulizer solution Commonly known as: PROVENTIL USE (1) IN NEBULIZER EVERY 6 HOURS AS NEEDED FOR SHORTNESS OF BREATH OR WHEEZING.   amLODipine 2.5 MG tablet Commonly known as: NORVASC Take 2.5 mg by mouth daily.   aspirin 81 MG EC tablet Take 1 tablet (81 mg total) by mouth daily with breakfast.   calcium elemental as carbonate 400 MG chewable tablet Commonly known as: BARIATRIC TUMS ULTRA Chew 3,000 mg by mouth at bedtime.   citalopram 20  MG tablet Commonly known as: CELEXA Take 1 tablet (20 mg total) by mouth at bedtime.   docusate sodium 100 MG capsule Commonly known as: COLACE TAKE 1 CAPSULE BY MOUTH IN THE MORNING What changed: when to take this   gabapentin 300 MG capsule Commonly known as: NEURONTIN TAKE 1 CAPSULE BY MOUTH IN THE MORNING TAKE 2 CAPSULE IN THE EVENING What changed:  how much to take how to take this when to take this   guaiFENesin 600 MG 12 hr tablet Commonly known as: MUCINEX Take 1 tablet (600 mg total) by mouth 2 (two) times daily.   HYDROcodone-acetaminophen 5-325 MG tablet Commonly known as: NORCO/VICODIN Take 1 tablet by mouth every 6 (six) hours as needed for moderate pain or severe pain.   levothyroxine 75 MCG tablet Commonly known as: SYNTHROID Take 1 tablet (75 mcg total) by mouth daily before breakfast.   lidocaine 5 % ointment Commonly known as: XYLOCAINE Apply 1 application topically 3 (three) times daily as needed. Apply to affected area   losartan 50 MG tablet Commonly known as: COZAAR Take 0.5 tablets (25 mg total) by mouth at bedtime. What changed: how much to take   methocarbamol 750 MG tablet Commonly known as: ROBAXIN Take 750-1,500 mg by mouth 3 (three) times daily as needed for muscle spasms.   mometasone 50 MCG/ACT nasal spray Commonly known as: NASONEX Place 2 sprays into the nose daily.   montelukast 10 MG tablet Commonly known as: SINGULAIR Take 1 tablet (10 mg total) by mouth at bedtime.   multivitamin Tabs tablet Take 1 tablet by mouth every morning.   omeprazole 20 MG capsule Commonly known as: PRILOSEC Take 1 capsule (20 mg total) by mouth every morning.   ondansetron 4 MG tablet Commonly known as: ZOFRAN Take 1 tablet (4 mg total) by mouth as needed for nausea or vomiting.   pravastatin 20 MG tablet Commonly known as: PRAVACHOL Take 20 mg by mouth daily as needed.   Trelegy Ellipta 100-62.5-25 MCG/ACT Aepb Generic drug:  Fluticasone-Umeclidin-Vilant Inhale 1 puff  into the lungs daily.        Procedures/Studies: CT Head Wo Contrast  Result Date: 07/13/2021 CLINICAL DATA:  Delirium, syncope, vomiting EXAM: CT HEAD WITHOUT CONTRAST TECHNIQUE: Contiguous axial images were obtained from the base of the skull through the vertex without intravenous contrast. COMPARISON:  None. FINDINGS: Brain: Normal anatomic configuration. Parenchymal volume loss is commensurate with the patient's age. Moderate to severe subcortical and periventricular white matter changes are present, stable since prior examination, likely reflecting the sequela of small vessel ischemia. No abnormal intra or extra-axial mass lesion or fluid collection. No abnormal mass effect or midline shift. No evidence of acute intracranial hemorrhage or infarct. Ventricular size is normal. Cerebellum unremarkable. Vascular: No asymmetric hyperdense vasculature at the skull base. Skull: Intact Sinuses/Orbits: Bilateral maxillary antrostomy has been performed. There is moderate mucosal thickening within the ethmoid air cells bilaterally. No air-fluid levels. Remaining paranasal sinuses are clear. Ocular lenses have been removed. Orbits are unremarkable. Other: Mastoid air cells and middle ear cavities are clear. IMPRESSION: Stable advanced senescent change. No acute intracranial hemorrhage or infarct. Mild paranasal sinus disease. Electronically Signed   By: Fidela Salisbury M.D.   On: 07/13/2021 00:15   CT Angio Chest PE W and/or Wo Contrast  Result Date: 07/13/2021 CLINICAL DATA:  Altered mental status and syncope. EXAM: CT ANGIOGRAPHY CHEST WITH CONTRAST TECHNIQUE: Multidetector CT imaging of the chest was performed using the standard protocol during bolus administration of intravenous contrast. Multiplanar CT image reconstructions and MIPs were obtained to evaluate the vascular anatomy. CONTRAST:  134mL OMNIPAQUE IOHEXOL 350 MG/ML SOLN COMPARISON:  August 30, 2019  FINDINGS: Cardiovascular: There is moderate severity calcification of the aortic arch, without evidence of aortic aneurysm. Satisfactory opacification of the pulmonary arteries to the segmental level. No evidence of pulmonary embolism. Normal heart size. No pericardial effusion. Mediastinum/Nodes: No enlarged mediastinal, hilar, or axillary lymph nodes. Thyroid gland, trachea, and esophagus demonstrate no significant findings. Lungs/Pleura: Very mild atelectasis is seen along the posterior aspect of the bilateral lower lobes. There is no evidence of acute infiltrate, pleural effusion or pneumothorax. Upper Abdomen: There is a small hiatal hernia. Musculoskeletal: Multilevel degenerative changes seen throughout the thoracic spine. Review of the MIP images confirms the above findings. IMPRESSION: 1. No evidence of pulmonary embolus or acute cardiopulmonary disease. 2. Small hiatal hernia. 3. Aortic atherosclerosis. Aortic Atherosclerosis (ICD10-I70.0). Electronically Signed   By: Virgina Norfolk M.D.   On: 07/13/2021 01:50   CT ABDOMEN PELVIS W CONTRAST  Result Date: 07/13/2021 CLINICAL DATA:  Altered mental status and syncopal episode. EXAM: CT ABDOMEN AND PELVIS WITH CONTRAST TECHNIQUE: Multidetector CT imaging of the abdomen and pelvis was performed using the standard protocol following bolus administration of intravenous contrast. CONTRAST:  116mL OMNIPAQUE IOHEXOL 350 MG/ML SOLN COMPARISON:  November 05, 2017 FINDINGS: Lower chest: No acute abnormality. Hepatobiliary: No focal liver abnormality is seen. No gallstones, gallbladder wall thickening, or biliary dilatation. Pancreas: Unremarkable. No pancreatic ductal dilatation or surrounding inflammatory changes. Spleen: Normal in size without focal abnormality. Adrenals/Urinary Tract: The right adrenal gland is unremarkable. A stable 1.4 cm x 0.8 cm low-attenuation left adrenal mass is noted. Kidneys are normal, without renal calculi, focal lesion, or  hydronephrosis. Bladder is unremarkable. Stomach/Bowel: Stomach is within normal limits. The appendix is not clearly identified. No evidence of bowel wall thickening, distention, or inflammatory changes. Vascular/Lymphatic: Aortic atherosclerosis. No enlarged abdominal or pelvic lymph nodes. Reproductive: Prostate is unremarkable. Other: There is a 3.8 cm x 3.9 cm right-sided  fat containing paraumbilical hernia. No abdominopelvic ascites. Musculoskeletal: Multilevel degenerative changes are seen throughout the lumbar spine. IMPRESSION: 1. Stable low-attenuation left adrenal mass which may represent an adrenal adenoma. 2. Right-sided fat containing paraumbilical hernia. 3. Aortic atherosclerosis. Aortic Atherosclerosis (ICD10-I70.0). Electronically Signed   By: Virgina Norfolk M.D.   On: 07/13/2021 01:48   DG Chest Portable 1 View  Result Date: 07/13/2021 CLINICAL DATA:  Shortness of breath and hypoxia with COVID-19 positivity, initial encounter EXAM: PORTABLE CHEST 1 VIEW COMPARISON:  08/30/2019 FINDINGS: Cardiac shadow is within normal limits. Aortic calcifications are noted. The lungs are well aerated bilaterally. No focal infiltrate or sizable effusion is seen. No bony abnormality is noted. IMPRESSION: No active disease. Electronically Signed   By: Inez Catalina M.D.   On: 07/13/2021 00:13     Subjective: Pt reports feeling much better, he ambulated very well with PT and he is wanting to go home.  No complaints at this time.    Discharge Exam: Vitals:   07/13/21 0800 07/13/21 0830  BP: 139/68 130/61  Pulse: 79 66  Resp:    Temp:    SpO2: 94% 93%   Vitals:   07/13/21 0700 07/13/21 0730 07/13/21 0800 07/13/21 0830  BP: 122/61 120/66 139/68 130/61  Pulse: 70 70 79 66  Resp:  18    Temp:      SpO2: 92% 93% 94% 93%  Weight:      Height:        General: Pt is alert, awake, not in acute distress Cardiovascular: RRR, S1/S2 +, no rubs, no gallops Respiratory: CTA bilaterally, no wheezing,  no rhonchi Abdominal: Soft, NT, ND, bowel sounds + Extremities: no edema, no cyanosis   The results of significant diagnostics from this hospitalization (including imaging, microbiology, ancillary and laboratory) are listed below for reference.     Microbiology: Recent Results (from the past 240 hour(s))  Resp Panel by RT-PCR (Flu A&B, Covid) Nasopharyngeal Swab     Status: None   Collection Time: 07/12/21 11:45 PM   Specimen: Nasopharyngeal Swab; Nasopharyngeal(NP) swabs in vial transport medium  Result Value Ref Range Status   SARS Coronavirus 2 by RT PCR NEGATIVE NEGATIVE Final    Comment: (NOTE) SARS-CoV-2 target nucleic acids are NOT DETECTED.  The SARS-CoV-2 RNA is generally detectable in upper respiratory specimens during the acute phase of infection. The lowest concentration of SARS-CoV-2 viral copies this assay can detect is 138 copies/mL. A negative result does not preclude SARS-Cov-2 infection and should not be used as the sole basis for treatment or other patient management decisions. A negative result may occur with  improper specimen collection/handling, submission of specimen other than nasopharyngeal swab, presence of viral mutation(s) within the areas targeted by this assay, and inadequate number of viral copies(<138 copies/mL). A negative result must be combined with clinical observations, patient history, and epidemiological information. The expected result is Negative.  Fact Sheet for Patients:  EntrepreneurPulse.com.au  Fact Sheet for Healthcare Providers:  IncredibleEmployment.be  This test is no t yet approved or cleared by the [place] FDA and  has been authorized for detection and/or diagnosis of SARS-CoV-2 by FDA under an Emergency Use Authorization (EUA). This EUA will remain  in effect (meaning this test can be used) for the duration of the COVID-19 declaration under Section 564(b)(1) of the Act,  21 U.S.C.section 360bbb-3(b)(1), unless the authorization is terminated  or revoked sooner.       Influenza A by PCR NEGATIVE NEGATIVE Final  Influenza B by PCR NEGATIVE NEGATIVE Final    Comment: (NOTE) The Xpert Xpress SARS-CoV-2/FLU/RSV plus assay is intended as an aid in the diagnosis of influenza from Nasopharyngeal swab specimens and should not be used as a sole basis for treatment. Nasal washings and aspirates are unacceptable for Xpert Xpress SARS-CoV-2/FLU/RSV testing.  Fact Sheet for Patients: EntrepreneurPulse.com.au  Fact Sheet for Healthcare Providers: IncredibleEmployment.be  This test is not yet approved or cleared by the [place] FDA and has been authorized for detection and/or diagnosis of SARS-CoV-2 by FDA under an Emergency Use Authorization (EUA). This EUA will remain in effect (meaning this test can be used) for the duration of the COVID-19 declaration under Section 564(b)(1) of the Act, 21 U.S.C. section 360bbb-3(b)(1), unless the authorization is terminated or revoked.  Performed at [facility], [address] [place], [place] [address]      Labs: BNP (last 3 results) No results for input(s): BNP in the last 8760 hours. Basic Metabolic Panel: Recent Labs  Lab 07/12/21 2350 07/13/21 0611  NA 132* 131*  K 4.6 4.6  CL 97* 96*  CO2 26 26  GLUCOSE 121* 136*  BUN 12 15  CREATININE 1.04 1.11  CALCIUM 8.7* 8.8*  MG  --  2.1   Liver Function Tests: Recent Labs  Lab 07/12/21 2350 07/13/21 0611  AST 17 18  ALT 13 14  ALKPHOS 54 52  BILITOT 0.4 0.8  PROT 6.3* 6.3*  ALBUMIN 3.7 3.7   Recent Labs  Lab 07/12/21 2350  LIPASE 26   Recent Labs  Lab 07/12/21 2350  AMMONIA 14   CBC: Recent Labs  Lab 07/12/21 2350 07/13/21 0611  WBC 10.7* 12.6*  NEUTROABS 9.0* 11.1*  HGB 13.4 12.9*  HCT 39.6 37.9*  MCV 98.5 97.4  PLT 257 248   Cardiac Enzymes: No results for input(s): CKTOTAL, CKMB,  CKMBINDEX, TROPONINI in the last 168 hours. BNP: Invalid input(s): POCBNP CBG: No results for input(s): GLUCAP in the last 168 hours. D-Dimer Recent Labs    07/12/21 2350  DDIMER 1.53*   Hgb A1c No results for input(s): HGBA1C in the last 72 hours. Lipid Profile No results for input(s): CHOL, HDL, LDLCALC, TRIG, CHOLHDL, LDLDIRECT in the last 72 hours. Thyroid function studies Recent Labs    07/13/21 0611  TSH 1.802   Anemia work up No results for input(s): VITAMINB12, FOLATE, FERRITIN, TIBC, IRON, RETICCTPCT in the last 72 hours. Urinalysis    Component Value Date/Time   COLORURINE YELLOW 07/12/2021 0601   APPEARANCEUR CLEAR 07/12/2021 0601   LABSPEC <1.005 (L) 07/12/2021 0601   PHURINE 5.5 07/12/2021 0601   GLUCOSEU NEGATIVE 07/12/2021 0601   HGBUR NEGATIVE 07/12/2021 0601   BILIRUBINUR NEGATIVE 07/12/2021 0601   KETONESUR NEGATIVE 07/12/2021 0601   PROTEINUR TRACE (A) 07/12/2021 0601   NITRITE NEGATIVE 07/12/2021 0601   LEUKOCYTESUR NEGATIVE 07/12/2021 0601   Sepsis Labs Invalid input(s): PROCALCITONIN,  WBC,  LACTICIDVEN Microbiology Recent Results (from the past 240 hour(s))  Resp Panel by RT-PCR (Flu A&B, Covid) Nasopharyngeal Swab     Status: None   Collection Time: 07/12/21 11:45 PM   Specimen: Nasopharyngeal Swab; Nasopharyngeal(NP) swabs in vial transport medium  Result Value Ref Range Status   SARS Coronavirus 2 by RT PCR NEGATIVE NEGATIVE Final    Comment: (NOTE) SARS-CoV-2 target nucleic acids are NOT DETECTED.  The SARS-CoV-2 RNA is generally detectable in upper respiratory specimens during the acute phase of infection. The lowest concentration of SARS-CoV-2 viral copies this  assay can detect is 138 copies/mL. A negative result does not preclude SARS-Cov-2 infection and should not be used as the sole basis for treatment or other patient management decisions. A negative result may occur with  improper specimen collection/handling, submission of  specimen other than nasopharyngeal swab, presence of viral mutation(s) within the areas targeted by this assay, and inadequate number of viral copies(<138 copies/mL). A negative result must be combined with clinical observations, patient history, and epidemiological information. The expected result is Negative.  Fact Sheet for Patients:  EntrepreneurPulse.com.au  Fact Sheet for Healthcare Providers:  IncredibleEmployment.be  This test is no t yet approved or cleared by the [place] FDA and  has been authorized for detection and/or diagnosis of SARS-CoV-2 by FDA under an Emergency Use Authorization (EUA). This EUA will remain  in effect (meaning this test can be used) for the duration of the COVID-19 declaration under Section 564(b)(1) of the Act, 21 U.S.C.section 360bbb-3(b)(1), unless the authorization is terminated  or revoked sooner.       Influenza A by PCR NEGATIVE NEGATIVE Final   Influenza B by PCR NEGATIVE NEGATIVE Final    Comment: (NOTE) The Xpert Xpress SARS-CoV-2/FLU/RSV plus assay is intended as an aid in the diagnosis of influenza from Nasopharyngeal swab specimens and should not be used as a sole basis for treatment. Nasal washings and aspirates are unacceptable for Xpert Xpress SARS-CoV-2/FLU/RSV testing.  Fact Sheet for Patients: EntrepreneurPulse.com.au  Fact Sheet for Healthcare Providers: IncredibleEmployment.be  This test is not yet approved or cleared by the [place] FDA and has been authorized for detection and/or diagnosis of SARS-CoV-2 by FDA under an Emergency Use Authorization (EUA). This EUA will remain in effect (meaning this test can be used) for the duration of the COVID-19 declaration under Section 564(b)(1) of the Act, 21 U.S.C. section 360bbb-3(b)(1), unless the authorization is terminated or revoked.  Performed at [facility], [address]  [place], [place] [address]     Time coordinating discharge:   SIGNED:  Irwin Brakeman, MD  [facility] 07/13/2021, 10:12 AM How to contact the [facility] Attending or Consulting provider [place] or covering provider during after hours [place], for this patient?  Check the care team in [facility] and look for a) attending/consulting [facility] provider listed and b) the [facility] team listed Log into [email or web address] and use [place]'s universal password to access. If you do not have the password, please contact the hospital operator. Locate the [facility] provider you are looking for under [facility] and page to a number that you can be directly reached. If you still have difficulty reaching the provider, please page the [facility] (Director on Call) for the Hospitalists listed on amion for assistance.

## 2021-07-13 NOTE — ED Notes (Signed)
Pt awaiting ride home and clothing.

## 2021-07-13 NOTE — Progress Notes (Signed)
°  Transition of Care [facility]) Screening Note   Patient Details  Name: Cristian Yang Date of Birth: 1935/01/07   Transition of Care [facility]) CM/SW Contact:    Shade Flood, LCSW Phone Number: 07/13/2021, 10:52 AM    Transition of Care Department [facility]) has reviewed patient and no TOC needs have been identified at this time. Will continue to monitor patient advancement through interdisciplinary progression rounds. If new patient transition needs arise, please place a TOC consult.

## 2021-07-13 NOTE — ED Notes (Signed)
Pt POA given update on pt status

## 2021-07-27 DIAGNOSIS — Z0000 Encounter for general adult medical examination without abnormal findings: Secondary | ICD-10-CM | POA: Diagnosis not present

## 2021-07-27 DIAGNOSIS — G894 Chronic pain syndrome: Secondary | ICD-10-CM | POA: Diagnosis not present

## 2021-07-27 DIAGNOSIS — J449 Chronic obstructive pulmonary disease, unspecified: Secondary | ICD-10-CM | POA: Diagnosis not present

## 2021-08-13 ENCOUNTER — Telehealth: Payer: Self-pay

## 2021-08-13 NOTE — Telephone Encounter (Signed)
ATC [employer] for a download. They are going to attempt to get a DL and send it to [facility] office

## 2021-08-14 ENCOUNTER — Other Ambulatory Visit: Payer: Self-pay

## 2021-08-14 ENCOUNTER — Ambulatory Visit (INDEPENDENT_AMBULATORY_CARE_PROVIDER_SITE_OTHER): Payer: Medicare Other | Admitting: Pulmonary Disease

## 2021-08-14 ENCOUNTER — Encounter: Payer: Self-pay | Admitting: Pulmonary Disease

## 2021-08-14 VITALS — BP 140/82 | HR 80 | Temp 98.0°F | Wt 217.0 lb

## 2021-08-14 DIAGNOSIS — G4733 Obstructive sleep apnea (adult) (pediatric): Secondary | ICD-10-CM

## 2021-08-14 MED ORDER — ALBUTEROL SULFATE (2.5 MG/3ML) 0.083% IN NEBU: INHALATION_SOLUTION | RESPIRATORY_TRACT | 6 refills | Status: DC

## 2021-08-14 MED ORDER — MOMETASONE FUROATE 50 MCG/ACT NA SUSP
2.0000 | Freq: Every day | NASAL | 3 refills | Status: AC
Start: 2021-08-14 — End: ?

## 2021-08-14 MED ORDER — TRELEGY ELLIPTA 100-62.5-25 MCG/ACT IN AEPB: 1.0000 | INHALATION_SPRAY | Freq: Every day | RESPIRATORY_TRACT | 11 refills | Status: DC

## 2021-08-14 NOTE — Progress Notes (Signed)
[place] [facility]  Chief Complaint  Patient presents with   Follow-up    CPAP working well    Constitutional:  BP 140/82 (BP Location: Left Arm, Patient Position: Sitting)    Pulse 80    Temp 98 F (36.7 C) (Temporal)    Wt 217 lb (98.4 kg)    SpO2 98% Comment: ra   BMI 32.99 kg/m   Past Medical History:  Chronic back pain, Depression, GERD, HLD, HTN, Hypothyroidism, Hiatal hernia  Past Surgical History:  His  has a past surgical history that includes Skin cancer excision (2013); Prostate surgery; Neck surgery; skin cancer removed; Cataract extraction w/PHACO (Left, 02/13/2016); and Cataract extraction w/PHACO (Right, 05/28/2019).  Brief Summary:  Cristian Yang is a 87 y.o. 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      Subjective:   He was in hospital in December for syncope.  He thinks this was related to a penicillin allergy.  He has cough with clear sputum.  Feels congested in his chest at times.  Uses trelegy daily and his nebulizer once or twice per day.  Not having sinus congestion.  Uses CPAP nightly.  No issues with mask fit.  Wants to know if he can stop using oxygen at night.   Physical Exam:   Appearance - well kempt   ENMT - no sinus tenderness, no oral exudate, no LAN, Mallampati 3 airway, no stridor, poor hearing  Respiratory - decreased breath sounds bilaterally, no wheezing or rales  CV - s1s2 regular rate and rhythm, no murmurs  Ext - no clubbing, no edema  Skin - no rashes  Psych - normal mood and affect     Pulmonary testing:  PFT 12/12/14 >> FEV1 1.61 (60%), FEV1% 66, TLC 5.48 (82%), DLCO 47%, no BD  Chest Imaging:  CT angio chest 08/30/19 >> hiatal hernia, patchy and tree in bud nodularity in LUL, b/l lower lobe consolidation CT angio chest 07/13/21 >> atherosclerosis, small hiatal hernia  Sleep Tests:  PSG 09/29/02 >> AHI 12, SaO2 low 85%  Cardiac Tests:  Echo  08/29/20 >> EF 60 to 65%, mod LVH, grade 1 DD, RVSP 12.1 mmHg, mild LA dilation, mild AS  Social History:  He  reports that he quit smoking about 18 years ago. His smoking use included cigarettes. He has a 60.00 pack-year smoking history. He has never used smokeless tobacco. He reports that he does not drink alcohol and does not use drugs.  Family History:  His family history includes Asthma in his father; Bone cancer in his sister; Diabetes in his mother; Heart disease in his mother; Liver cancer in his brother.     Assessment/Plan:   COPD with emphysema and asthma. - continue trelegy and singulair - prn abluterol - advised him to try using mucinex on more regular basis   Obstructive sleep apnea. - he is compliant with CPAP - he uses [facility] for his DME - will try to get a copy of his most recent CPAP download - will arrange for overnight oximetry with CPAP and then determine if he can have nocturnal oxygen set up removed   Upper airway cough with post-nasal drip. - continue nasonex, singulair  - prn zytrec  Time Spent Involved in Patient Care on Day of Examination:  37 minutes  Follow up:   Patient Instructions  Try using mucinex 1200 mg twice per day as needed to help with cough and chest  congestion  Will arrange for overnight oxygen test with you using CPAP  Will get a copy of your CPAP report and call you with results  Follow up in 6 months  Medication List:   Allergies as of 08/14/2021       Reactions   Ibuprofen Hives   Bilateral arm hives after taking ibuprofen   Ciprofloxacin Nausea And Vomiting   Codeine Nausea And Vomiting        Medication List        Accurate as of August 14, 2021 11:06 AM. If you have any questions, ask your nurse or doctor.          albuterol (2.5 MG/3ML) 0.083% nebulizer solution Commonly known as: PROVENTIL USE (1) IN NEBULIZER EVERY 6 HOURS AS NEEDED FOR SHORTNESS OF BREATH OR WHEEZING.   amLODipine 2.5 MG  tablet Commonly known as: NORVASC Take 2.5 mg by mouth daily.   aspirin 81 MG EC tablet Take 1 tablet (81 mg total) by mouth daily with breakfast.   calcium elemental as carbonate 400 MG chewable tablet Commonly known as: BARIATRIC TUMS ULTRA Chew 3,000 mg by mouth at bedtime.   citalopram 20 MG tablet Commonly known as: CELEXA Take 1 tablet (20 mg total) by mouth at bedtime.   docusate sodium 100 MG capsule Commonly known as: COLACE TAKE 1 CAPSULE BY MOUTH IN THE MORNING What changed: when to take this   gabapentin 300 MG capsule Commonly known as: NEURONTIN TAKE 1 CAPSULE BY MOUTH IN THE MORNING TAKE 2 CAPSULE IN THE EVENING What changed:  how much to take how to take this when to take this   guaiFENesin 600 MG 12 hr tablet Commonly known as: MUCINEX Take 1 tablet (600 mg total) by mouth 2 (two) times daily.   HYDROcodone-acetaminophen 5-325 MG tablet Commonly known as: NORCO/VICODIN Take 1 tablet by mouth every 6 (six) hours as needed for moderate pain or severe pain.   levothyroxine 75 MCG tablet Commonly known as: SYNTHROID Take 1 tablet (75 mcg total) by mouth daily before breakfast.   lidocaine 5 % ointment Commonly known as: XYLOCAINE Apply 1 application topically 3 (three) times daily as needed. Apply to affected area   losartan 50 MG tablet Commonly known as: COZAAR Take 0.5 tablets (25 mg total) by mouth at bedtime.   methocarbamol 750 MG tablet Commonly known as: ROBAXIN Take 750-1,500 mg by mouth 3 (three) times daily as needed for muscle spasms.   mometasone 50 MCG/ACT nasal spray Commonly known as: NASONEX Place 2 sprays into the nose daily.   montelukast 10 MG tablet Commonly known as: SINGULAIR Take 1 tablet (10 mg total) by mouth at bedtime.   multivitamin Tabs tablet Take 1 tablet by mouth every morning.   omeprazole 20 MG capsule Commonly known as: PRILOSEC Take 1 capsule (20 mg total) by mouth every morning.   ondansetron 4 MG  tablet Commonly known as: ZOFRAN Take 1 tablet (4 mg total) by mouth as needed for nausea or vomiting.   pravastatin 20 MG tablet Commonly known as: PRAVACHOL Take 20 mg by mouth daily as needed.   Trelegy Ellipta 100-62.5-25 MCG/ACT Aepb Generic drug: Fluticasone-Umeclidin-Vilant Inhale 1 puff into the lungs daily.        Signature:  Chesley Mires, MD [place] Pager - [phone] 08/14/2021, 11:06 AM

## 2021-08-14 NOTE — Patient Instructions (Signed)
Try using mucinex 1200 mg twice per day as needed to help with cough and chest congestion  Will arrange for overnight oxygen test with you using CPAP  Will get a copy of your CPAP report and call you with results  Follow up in 6 months

## 2021-08-16 ENCOUNTER — Ambulatory Visit: Payer: Medicare Other | Admitting: Family Medicine

## 2021-08-16 DIAGNOSIS — R21 Rash and other nonspecific skin eruption: Secondary | ICD-10-CM | POA: Diagnosis not present

## 2021-08-16 DIAGNOSIS — J449 Chronic obstructive pulmonary disease, unspecified: Secondary | ICD-10-CM | POA: Diagnosis not present

## 2021-09-11 ENCOUNTER — Ambulatory Visit: Payer: Medicare Other | Admitting: Internal Medicine

## 2021-09-26 DIAGNOSIS — M542 Cervicalgia: Secondary | ICD-10-CM | POA: Diagnosis not present

## 2021-09-26 DIAGNOSIS — M5090 Cervical disc disorder, unspecified, unspecified cervical region: Secondary | ICD-10-CM | POA: Diagnosis not present

## 2021-10-11 DIAGNOSIS — Z20828 Contact with and (suspected) exposure to other viral communicable diseases: Secondary | ICD-10-CM | POA: Diagnosis not present

## 2021-10-11 DIAGNOSIS — Z1152 Encounter for screening for COVID-19: Secondary | ICD-10-CM | POA: Diagnosis not present

## 2021-10-16 DIAGNOSIS — Z20822 Contact with and (suspected) exposure to covid-19: Secondary | ICD-10-CM | POA: Diagnosis not present

## 2021-10-25 DIAGNOSIS — Z20822 Contact with and (suspected) exposure to covid-19: Secondary | ICD-10-CM | POA: Diagnosis not present

## 2021-11-13 DIAGNOSIS — Z20822 Contact with and (suspected) exposure to covid-19: Secondary | ICD-10-CM | POA: Diagnosis not present

## 2021-11-14 DIAGNOSIS — Z20822 Contact with and (suspected) exposure to covid-19: Secondary | ICD-10-CM | POA: Diagnosis not present

## 2021-11-20 DIAGNOSIS — Z20828 Contact with and (suspected) exposure to other viral communicable diseases: Secondary | ICD-10-CM | POA: Diagnosis not present

## 2021-11-20 DIAGNOSIS — Z20822 Contact with and (suspected) exposure to covid-19: Secondary | ICD-10-CM | POA: Diagnosis not present

## 2021-11-25 DIAGNOSIS — Z20822 Contact with and (suspected) exposure to covid-19: Secondary | ICD-10-CM | POA: Diagnosis not present

## 2021-11-26 DIAGNOSIS — Z20822 Contact with and (suspected) exposure to covid-19: Secondary | ICD-10-CM | POA: Diagnosis not present

## 2021-12-26 ENCOUNTER — Emergency Department (HOSPITAL_COMMUNITY): Payer: Medicare Other

## 2021-12-26 ENCOUNTER — Other Ambulatory Visit: Payer: Self-pay

## 2021-12-26 ENCOUNTER — Encounter (HOSPITAL_COMMUNITY): Payer: Self-pay | Admitting: Emergency Medicine

## 2021-12-26 ENCOUNTER — Emergency Department (HOSPITAL_COMMUNITY)
Admission: EM | Admit: 2021-12-26 | Discharge: 2021-12-26 | Discharge status: Against Medical Advice | Payer: Medicare Other | Attending: Emergency Medicine | Admitting: Emergency Medicine

## 2021-12-26 DIAGNOSIS — R059 Cough, unspecified: Secondary | ICD-10-CM | POA: Diagnosis not present

## 2021-12-26 DIAGNOSIS — J449 Chronic obstructive pulmonary disease, unspecified: Secondary | ICD-10-CM | POA: Diagnosis not present

## 2021-12-26 DIAGNOSIS — J189 Pneumonia, unspecified organism: Secondary | ICD-10-CM

## 2021-12-26 DIAGNOSIS — Z79899 Other long term (current) drug therapy: Secondary | ICD-10-CM | POA: Diagnosis not present

## 2021-12-26 DIAGNOSIS — Z7951 Long term (current) use of inhaled steroids: Secondary | ICD-10-CM | POA: Diagnosis not present

## 2021-12-26 DIAGNOSIS — Z7982 Long term (current) use of aspirin: Secondary | ICD-10-CM | POA: Diagnosis not present

## 2021-12-26 DIAGNOSIS — D72829 Elevated white blood cell count, unspecified: Secondary | ICD-10-CM | POA: Diagnosis not present

## 2021-12-26 DIAGNOSIS — I10 Essential (primary) hypertension: Secondary | ICD-10-CM | POA: Insufficient documentation

## 2021-12-26 DIAGNOSIS — Z20822 Contact with and (suspected) exposure to covid-19: Secondary | ICD-10-CM | POA: Diagnosis not present

## 2021-12-26 DIAGNOSIS — E871 Hypo-osmolality and hyponatremia: Secondary | ICD-10-CM | POA: Insufficient documentation

## 2021-12-26 DIAGNOSIS — J45909 Unspecified asthma, uncomplicated: Secondary | ICD-10-CM | POA: Diagnosis not present

## 2021-12-26 DIAGNOSIS — J181 Lobar pneumonia, unspecified organism: Secondary | ICD-10-CM | POA: Insufficient documentation

## 2021-12-26 DIAGNOSIS — R6883 Chills (without fever): Secondary | ICD-10-CM | POA: Diagnosis not present

## 2021-12-26 LAB — CBC
HCT: 35.4 % — ABNORMAL LOW (ref 39.0–52.0)
Hemoglobin: 12.4 g/dL — ABNORMAL LOW (ref 13.0–17.0)
MCH: 32.1 pg (ref 26.0–34.0)
MCHC: 35 g/dL (ref 30.0–36.0)
MCV: 91.7 fL (ref 80.0–100.0)
Platelets: 220 10*3/uL (ref 150–400)
RBC: 3.86 MIL/uL — ABNORMAL LOW (ref 4.22–5.81)
RDW: 13 % (ref 11.5–15.5)
WBC: 22.4 10*3/uL — ABNORMAL HIGH (ref 4.0–10.5)
nRBC: 0 % (ref 0.0–0.2)

## 2021-12-26 LAB — BASIC METABOLIC PANEL
Anion gap: 10 (ref 5–15)
BUN: 20 mg/dL (ref 8–23)
CO2: 24 mmol/L (ref 22–32)
Calcium: 8.6 mg/dL — ABNORMAL LOW (ref 8.9–10.3)
Chloride: 93 mmol/L — ABNORMAL LOW (ref 98–111)
Creatinine, Ser: 1.01 mg/dL (ref 0.61–1.24)
GFR, Estimated: >60 mL/min (ref >60)
Glucose, Bld: 131 mg/dL — ABNORMAL HIGH (ref 70–99)
Potassium: 3.4 mmol/L — ABNORMAL LOW (ref 3.5–5.1)
Sodium: 127 mmol/L — ABNORMAL LOW (ref 135–145)

## 2021-12-26 LAB — LACTIC ACID, PLASMA: Lactic Acid, Venous: 1.3 mmol/L (ref 0.5–1.9)

## 2021-12-26 LAB — SARS CORONAVIRUS 2 BY RT PCR: SARS Coronavirus 2 by RT PCR: NEGATIVE

## 2021-12-26 LAB — TROPONIN I (HIGH SENSITIVITY): Troponin I (High Sensitivity): 13 ng/L (ref <18)

## 2021-12-26 MED ORDER — SODIUM CHLORIDE 0.9 % IV SOLN
500.0000 mg | Freq: Once | INTRAVENOUS | Status: AC
  Administered 2021-12-26: 500 mg via INTRAVENOUS
  Filled 2021-12-26: qty 5

## 2021-12-26 MED ORDER — SODIUM CHLORIDE 0.9 % IV SOLN
1.0000 g | Freq: Once | INTRAVENOUS | Status: AC
  Administered 2021-12-26: 1 g via INTRAVENOUS
  Filled 2021-12-26: qty 10

## 2021-12-26 MED ORDER — AMOXICILLIN-POT CLAVULANATE 875-125 MG PO TABS: 1.0000 | ORAL_TABLET | Freq: Two times a day (BID) | ORAL | 0 refills | Status: DC

## 2021-12-26 MED ORDER — AZITHROMYCIN 250 MG PO TABS
ORAL_TABLET | ORAL | 0 refills | Status: DC
Start: 2021-12-26 — End: 2022-01-05

## 2021-12-26 NOTE — Discharge Instructions (Addendum)
Your work-up today shows that you have a pneumonia of your right lung.  You have been recommended to stay in the hospital but choosing to leave [place].  Please follow-up with your primary care provider this week for recheck, you are welcome to return to the emergency department at any time if you change your mind or if your symptoms worsen.

## 2021-12-26 NOTE — ED Provider Notes (Signed)
[facility] EMERGENCY DEPARTMENT Provider Note   CSN: [id number] Arrival date & time: 12/26/21  1304     History  Chief Complaint  Patient presents with   URI    Cristian Yang is a 87 y.o. male.   URI Presenting symptoms: cough   Presenting symptoms: no congestion and no fever   Associated symptoms: myalgias and wheezing   Associated symptoms: no arthralgias, no headaches and no neck pain        Cristian Yang is a 87 y.o. male with past medical history significant for hypertension, COPD, asthma, OSA on CPAP with supplemental oxygen at 2 L at night.  He presents to the Emergency Department complaining of generalized body aches, cough, chills, and nausea.  Symptoms present x2 days, gradually worsening.  Endorses occasional cough that is mostly nonproductive wears oxygen at night at 2 L through his CPAP.  Symptoms first began as nausea without vomiting.  Had watery loose stools last evening that were nonbloody or black.  No known fever at home.  Denies any increased shortness of breath or chest pain.  He lives by himself.  Also complains of skin tear to the left elbow and fell out of bed last evening.  Denies head injury or LOC.   Home Medications Prior to Admission medications   Medication Sig Start Date End Date Taking? Authorizing Provider  albuterol (PROVENTIL) (2.5 MG/3ML) 0.083% nebulizer solution USE (1) IN NEBULIZER EVERY 6 HOURS AS NEEDED FOR SHORTNESS OF BREATH OR WHEEZING. 08/14/21   Chesley Mires, MD  amLODipine (NORVASC) 2.5 MG tablet Take 2.5 mg by mouth daily. 07/13/20   [provider]  aspirin EC 81 MG EC tablet Take 1 tablet (81 mg total) by mouth daily with breakfast. 09/01/19   Roxan Hockey, MD  calcium elemental as carbonate (BARIATRIC TUMS ULTRA) 400 MG chewable tablet Chew 3,000 mg by mouth at bedtime.    [provider]  citalopram (CELEXA) 20 MG tablet Take 1 tablet (20 mg total) by mouth at bedtime. 10/15/18   Bufford Lope, DO  docusate  sodium (COLACE) 100 MG capsule TAKE 1 CAPSULE BY MOUTH IN THE MORNING Patient taking differently: Take 100 mg by mouth daily. 01/08/19   Harriet Butte, DO  Fluticasone-Umeclidin-Vilant (TRELEGY ELLIPTA) 100-62.5-25 MCG/ACT AEPB Inhale 1 puff into the lungs daily. 08/14/21   Chesley Mires, MD  gabapentin (NEURONTIN) 300 MG capsule TAKE 1 CAPSULE BY MOUTH IN THE MORNING TAKE 2 CAPSULE IN THE EVENING Patient taking differently: Take 300 mg by mouth 3 (three) times daily. TAKE 1 CAPSULE BY MOUTH IN THE MORNING TAKE 2 CAPSULE IN THE EVENING 05/01/18   Harriet Butte, DO  guaiFENesin (MUCINEX) 600 MG 12 hr tablet Take 1 tablet (600 mg total) by mouth 2 (two) times daily. 09/01/19   Roxan Hockey, MD  HYDROcodone-acetaminophen (NORCO/VICODIN) 5-325 MG tablet Take 1 tablet by mouth every 6 (six) hours as needed for moderate pain or severe pain. 08/30/19   Rolland Porter, MD  levothyroxine (SYNTHROID, LEVOTHROID) 75 MCG tablet Take 1 tablet (75 mcg total) by mouth daily before breakfast. 10/15/18   Bufford Lope, DO  lidocaine (XYLOCAINE) 5 % ointment Apply 1 application topically 3 (three) times daily as needed. Apply to affected area 11/21/17   Nuala Alpha, MD  losartan (COZAAR) 50 MG tablet Take 0.5 tablets (25 mg total) by mouth at bedtime. 07/13/21   Johnson, Clanford L, MD  methocarbamol (ROBAXIN) 750 MG tablet Take 750-1,500 mg by mouth 3 (three)  times daily as needed for muscle spasms. 05/30/20   [provider]  mometasone (NASONEX) 50 MCG/ACT nasal spray Place 2 sprays into the nose daily. 08/14/21   Chesley Mires, MD  montelukast (SINGULAIR) 10 MG tablet Take 1 tablet (10 mg total) by mouth at bedtime. 04/06/20   Chesley Mires, MD  multivitamin (ONE-A-DAY MEN'S) TABS tablet Take 1 tablet by mouth every morning. 10/15/18   Bufford Lope, DO  omeprazole (PRILOSEC) 20 MG capsule Take 1 capsule (20 mg total) by mouth every morning. 09/01/19   Roxan Hockey, MD  ondansetron (ZOFRAN) 4 MG tablet Take 1  tablet (4 mg total) by mouth as needed for nausea or vomiting. 06/03/21   Azucena Cecil, PA-C  pravastatin (PRAVACHOL) 20 MG tablet Take 20 mg by mouth daily as needed. 05/11/21   [provider]      Allergies    Ibuprofen, Ciprofloxacin, and Codeine    Review of Systems   Review of Systems  Constitutional:  Positive for appetite change. Negative for fever.  HENT:  Negative for congestion.   Respiratory:  Positive for cough, shortness of breath and wheezing.   Gastrointestinal:  Positive for nausea. Negative for abdominal pain and vomiting.  Genitourinary:  Negative for decreased urine volume, difficulty urinating and dysuria.  Musculoskeletal:  Positive for myalgias. Negative for arthralgias, neck pain and neck stiffness.  Skin:  Negative for rash.  Neurological:  Negative for dizziness and headaches.    Physical Exam Updated Vital Signs BP 133/60 (BP Location: Right Arm)   Pulse 62   Temp 97.7 F (36.5 C) (Oral)   Resp (!) 24   Ht '5\' 8"'$  (1.727 m)   Wt 98.9 kg   SpO2 94%   BMI 33.15 kg/m  Physical Exam Constitutional:      Appearance: He is ill-appearing.     Comments: Patient ill-appearing but nontoxic  HENT:     Right Ear: Tympanic membrane and ear canal normal.     Left Ear: Tympanic membrane and ear canal normal.     Mouth/Throat:     Mouth: Mucous membranes are moist.     Pharynx: Oropharynx is clear. No posterior oropharyngeal erythema.  Eyes:     Conjunctiva/sclera: Conjunctivae normal.     Pupils: Pupils are equal, round, and reactive to light.  Cardiovascular:     Rate and Rhythm: Normal rate and regular rhythm.  Pulmonary:     Breath sounds: Wheezing and rales present. No rhonchi.     Comments: Increased work of breathing during exam, rales heard at both bases.  Some expiratory wheezing as well. Abdominal:     General: There is no distension.     Palpations: Abdomen is soft.     Tenderness: There is no abdominal tenderness.   Musculoskeletal:     Right lower leg: No edema.     Left lower leg: No edema.  Lymphadenopathy:     Cervical: No cervical adenopathy.  Skin:    Capillary Refill: Capillary refill takes less than 2 seconds.  Neurological:     General: No focal deficit present.     Mental Status: He is alert.     Sensory: No sensory deficit.     Motor: No weakness.     ED Results / Procedures / Treatments   Labs (all labs ordered are listed, but only abnormal results are displayed) Labs Reviewed  CBC - Abnormal; Notable for the following components:      Result Value  WBC 22.4 (*)    RBC 3.86 (*)    Hemoglobin 12.4 (*)    HCT 35.4 (*)    All other components within normal limits  SARS CORONAVIRUS 2 BY RT PCR  BASIC METABOLIC PANEL  TROPONIN I (HIGH SENSITIVITY)    EKG EKG Interpretation  Date/Time:  Wednesday December 26 2021 13:33:09 EDT Ventricular Rate:  66 PR Interval:  203 QRS Duration: 168 QT Interval:  445 QTC Calculation: 467 R Axis:   -74 Text Interpretation: Sinus rhythm Multiform ventricular premature complexes RBBB and LAFB Probable left ventricular hypertrophy Confirmed by Sherwood Gambler [phone]) on 12/26/2021 2:39:39 PM  Radiology DG Chest 2 View  Result Date: 12/26/2021 CLINICAL DATA:  Cough, chills and body aches. EXAM: CHEST - 2 VIEW COMPARISON:  Chest radiograph July 13, 2021 FINDINGS: The heart size and mediastinal contours are within normal limits. Aortic atherosclerosis. Patchy right lower lobe airspace opacity. No pleural effusion. No pneumothorax. Partially visualized anterior cervical fusion hardware. Thoracic spondylosis. IMPRESSION: Patchy right lower lobe airspace opacity suspicious for pneumonia. Electronically Signed   By: Dahlia Bailiff M.D.   On: 12/26/2021 13:53    Procedures Procedures    Medications Ordered in ED Medications - No data to display  ED Course/ Medical Decision Making/ A&P                           Medical Decision Making Patient  with history of COPD and asthma has obstructive sleep apnea and wears 2 L oxygen at night through CPAP.  Here for evaluation of cough, shortness of breath fatigue and nausea.  On exam, patient has some increased work of breathing.  Hypoxic upon arrival and was placed on 2 L oxygen by nasal cannula.  Sats improved into the 90s after placement of oxygen.  Course rales heard at both lung bases.  Exam concerning for pneumonia.  Differential would include viral versus bacterial source.  PE, ACS, exacerbation COPD also considered.  Patient's complaint carries potential for complications and high morbidity.  No fever, no fever.  Doubt sepsis.  Amount and/or Complexity of Data Reviewed Labs: ordered.    Details: Labs interpreted by me, marked leukocytosis with white count of 22,000, COVID testing negative.  Electrolytes show hyponatremia with sodium 27 no evidence of AKI troponin unremarkable at 13 and lactic acid reassuring at 1.3 Radiology: ordered.    Details: Chest x-ray shows patchy opacity at right lung base, likely pneumonia ECG/medicine tests:     Details: EKG reviewed by Dr. Ardith Dark shows PVCs, right bundle branch block Discussion of management or test interpretation with external provider(s): Discussed findings with patient.  No clinical findings to suggest sepsis.  Patient is hypoxic with pneumonia seen on x-ray, given IV antibiotics here and I have recommended hospital admission.  Patient declined stating that he has a cat at home to care for I have discussed in detail risk involved with going home including death.  Patient verbalizes understanding.  States that he has oxygen at home and will use his oxygen at 2 to 3 L continuously.  He is also agreeable to close outpatient follow-up with his PCP pain.  He is agreeable to ER return if his symptoms worsen.  We will treat his pneumonia with oral antibiotics as well.  Patient to sign out AMA.  Risk Prescription drug management.  . Discussed  work-up results with patient.  Recommending hospital admission for pneumonia and hypoxia.  He is requesting to leave  stating that he has a cat at home and no one at home to care for him.  I have discussed risk involved with leaving to include worsening condition and death.  Patient verbalized understanding.  Continues to want to leave, agreeable to sign out [place].  States he has oxygen at home and will increase his oxygen to 2 L continuously.  I will prescribe antibiotics that he will start tomorrow.  Agreeable to close outpatient follow-up with his PCP or pulmonologist.  Advised patient that he may return to the emergency department at any time if his symptoms worsen or if he changes his mind regarding admission        Final Clinical Impression(s) / ED Diagnoses Final diagnoses:  Community acquired pneumonia of right lower lobe of lung    Rx / DC Orders ED Discharge Orders     None         Kem Parkinson, PA-C 12/29/21 1408    Sherwood Gambler, MD 12/31/21 1721

## 2021-12-26 NOTE — ED Notes (Addendum)
Antibiotics started after blood cultures drawn.

## 2021-12-26 NOTE — ED Triage Notes (Signed)
Pt has URI symptoms, cough, chills, body aches.

## 2021-12-31 LAB — CULTURE, BLOOD (ROUTINE X 2)
Culture: NO GROWTH
Culture: NO GROWTH
Special Requests: ADEQUATE

## 2022-01-02 ENCOUNTER — Other Ambulatory Visit (HOSPITAL_COMMUNITY): Payer: Self-pay | Admitting: Adult Health Nurse Practitioner

## 2022-01-02 DIAGNOSIS — G894 Chronic pain syndrome: Secondary | ICD-10-CM | POA: Diagnosis not present

## 2022-01-02 DIAGNOSIS — J189 Pneumonia, unspecified organism: Secondary | ICD-10-CM

## 2022-01-02 DIAGNOSIS — I10 Essential (primary) hypertension: Secondary | ICD-10-CM | POA: Diagnosis not present

## 2022-01-02 DIAGNOSIS — M545 Low back pain, unspecified: Secondary | ICD-10-CM

## 2022-01-02 DIAGNOSIS — E871 Hypo-osmolality and hyponatremia: Secondary | ICD-10-CM | POA: Diagnosis not present

## 2022-01-02 DIAGNOSIS — R601 Generalized edema: Secondary | ICD-10-CM | POA: Diagnosis not present

## 2022-01-03 ENCOUNTER — Other Ambulatory Visit: Payer: Self-pay | Admitting: Family Medicine

## 2022-01-04 ENCOUNTER — Observation Stay (HOSPITAL_COMMUNITY)
Admission: EM | Admit: 2022-01-04 | Discharge: 2022-01-05 | Disposition: A | Discharge status: Home / Self Care | Payer: Medicare Other | Attending: Family Medicine | Admitting: Family Medicine

## 2022-01-04 ENCOUNTER — Other Ambulatory Visit: Payer: Self-pay

## 2022-01-04 ENCOUNTER — Encounter (HOSPITAL_COMMUNITY): Payer: Self-pay | Admitting: Emergency Medicine

## 2022-01-04 ENCOUNTER — Emergency Department (HOSPITAL_COMMUNITY): Payer: Medicare Other

## 2022-01-04 DIAGNOSIS — I2699 Other pulmonary embolism without acute cor pulmonale: Secondary | ICD-10-CM | POA: Diagnosis not present

## 2022-01-04 DIAGNOSIS — G2581 Restless legs syndrome: Secondary | ICD-10-CM | POA: Insufficient documentation

## 2022-01-04 DIAGNOSIS — E782 Mixed hyperlipidemia: Secondary | ICD-10-CM | POA: Diagnosis not present

## 2022-01-04 DIAGNOSIS — G4733 Obstructive sleep apnea (adult) (pediatric): Secondary | ICD-10-CM | POA: Diagnosis not present

## 2022-01-04 DIAGNOSIS — Z85828 Personal history of other malignant neoplasm of skin: Secondary | ICD-10-CM | POA: Insufficient documentation

## 2022-01-04 DIAGNOSIS — I10 Essential (primary) hypertension: Secondary | ICD-10-CM | POA: Insufficient documentation

## 2022-01-04 DIAGNOSIS — Z87891 Personal history of nicotine dependence: Secondary | ICD-10-CM | POA: Insufficient documentation

## 2022-01-04 DIAGNOSIS — J9621 Acute and chronic respiratory failure with hypoxia: Secondary | ICD-10-CM | POA: Insufficient documentation

## 2022-01-04 DIAGNOSIS — Z79899 Other long term (current) drug therapy: Secondary | ICD-10-CM | POA: Insufficient documentation

## 2022-01-04 DIAGNOSIS — J449 Chronic obstructive pulmonary disease, unspecified: Secondary | ICD-10-CM | POA: Diagnosis not present

## 2022-01-04 DIAGNOSIS — E039 Hypothyroidism, unspecified: Secondary | ICD-10-CM | POA: Diagnosis not present

## 2022-01-04 DIAGNOSIS — R0600 Dyspnea, unspecified: Secondary | ICD-10-CM | POA: Diagnosis not present

## 2022-01-04 DIAGNOSIS — Z9989 Dependence on other enabling machines and devices: Secondary | ICD-10-CM | POA: Diagnosis not present

## 2022-01-04 DIAGNOSIS — J45909 Unspecified asthma, uncomplicated: Secondary | ICD-10-CM | POA: Insufficient documentation

## 2022-01-04 DIAGNOSIS — F32A Depression, unspecified: Secondary | ICD-10-CM

## 2022-01-04 DIAGNOSIS — R0602 Shortness of breath: Secondary | ICD-10-CM | POA: Diagnosis not present

## 2022-01-04 LAB — COMPREHENSIVE METABOLIC PANEL
ALT: 26 U/L (ref 0–44)
AST: 25 U/L (ref 15–41)
Albumin: 3.6 g/dL (ref 3.5–5.0)
Alkaline Phosphatase: 55 U/L (ref 38–126)
Anion gap: 7 (ref 5–15)
BUN: 13 mg/dL (ref 8–23)
CO2: 30 mmol/L (ref 22–32)
Calcium: 8.8 mg/dL — ABNORMAL LOW (ref 8.9–10.3)
Chloride: 97 mmol/L — ABNORMAL LOW (ref 98–111)
Creatinine, Ser: 0.92 mg/dL (ref 0.61–1.24)
GFR, Estimated: >60 mL/min (ref >60)
Glucose, Bld: 111 mg/dL — ABNORMAL HIGH (ref 70–99)
Potassium: 4.3 mmol/L (ref 3.5–5.1)
Sodium: 134 mmol/L — ABNORMAL LOW (ref 135–145)
Total Bilirubin: 0.5 mg/dL (ref 0.3–1.2)
Total Protein: 6.9 g/dL (ref 6.5–8.1)

## 2022-01-04 LAB — CBC WITH DIFFERENTIAL/PLATELET
Abs Immature Granulocytes: 0.09 10*3/uL — ABNORMAL HIGH (ref 0.00–0.07)
Basophils Absolute: 0 10*3/uL (ref 0.0–0.1)
Basophils Relative: 1 %
Eosinophils Absolute: 0.2 10*3/uL (ref 0.0–0.5)
Eosinophils Relative: 3 %
HCT: 33.4 % — ABNORMAL LOW (ref 39.0–52.0)
Hemoglobin: 11.2 g/dL — ABNORMAL LOW (ref 13.0–17.0)
Immature Granulocytes: 1 %
Lymphocytes Relative: 17 %
Lymphs Abs: 1.3 10*3/uL (ref 0.7–4.0)
MCH: 32.2 pg (ref 26.0–34.0)
MCHC: 33.5 g/dL (ref 30.0–36.0)
MCV: 96 fL (ref 80.0–100.0)
Monocytes Absolute: 1.6 10*3/uL — ABNORMAL HIGH (ref 0.1–1.0)
Monocytes Relative: 21 %
Neutro Abs: 4.4 10*3/uL (ref 1.7–7.7)
Neutrophils Relative %: 57 %
Platelets: 376 10*3/uL (ref 150–400)
RBC: 3.48 MIL/uL — ABNORMAL LOW (ref 4.22–5.81)
RDW: 12.9 % (ref 11.5–15.5)
WBC: 7.7 10*3/uL (ref 4.0–10.5)
nRBC: 0 % (ref 0.0–0.2)

## 2022-01-04 LAB — APTT: aPTT: 29 seconds (ref 24–36)

## 2022-01-04 LAB — PROTIME-INR
INR: 1.1 (ref 0.8–1.2)
Prothrombin Time: 13.9 seconds (ref 11.4–15.2)

## 2022-01-04 MED ORDER — ASPIRIN 81 MG PO TBEC
81.0000 mg | DELAYED_RELEASE_TABLET | Freq: Every day | ORAL | Status: DC
  Administered 2022-01-05: 81 mg via ORAL
  Filled 2022-01-04: qty 1

## 2022-01-04 MED ORDER — ACETAMINOPHEN 650 MG RE SUPP: 650.0000 mg | Freq: Four times a day (QID) | RECTAL | Status: DC | PRN

## 2022-01-04 MED ORDER — AMLODIPINE BESYLATE 5 MG PO TABS
2.5000 mg | ORAL_TABLET | Freq: Every day | ORAL | Status: DC
  Administered 2022-01-05: 2.5 mg via ORAL
  Filled 2022-01-04: qty 1

## 2022-01-04 MED ORDER — PRAVASTATIN SODIUM 10 MG PO TABS
20.0000 mg | ORAL_TABLET | Freq: Every day | ORAL | Status: DC
  Administered 2022-01-05: 20 mg via ORAL
  Filled 2022-01-04: qty 2

## 2022-01-04 MED ORDER — HEPARIN BOLUS VIA INFUSION: 5000.0000 [IU] | Freq: Once | INTRAVENOUS | Status: DC

## 2022-01-04 MED ORDER — ORAL CARE MOUTH RINSE
15.0000 mL | OROMUCOSAL | Status: DC | PRN
Start: 2022-01-04 — End: 2022-01-05

## 2022-01-04 MED ORDER — ORAL CARE MOUTH RINSE
15.0000 mL | OROMUCOSAL | Status: DC
  Administered 2022-01-05 (×2): 15 mL via OROMUCOSAL

## 2022-01-04 MED ORDER — ONDANSETRON HCL 4 MG PO TABS: 4.0000 mg | ORAL_TABLET | Freq: Four times a day (QID) | ORAL | Status: DC | PRN

## 2022-01-04 MED ORDER — CITALOPRAM HYDROBROMIDE 20 MG PO TABS
20.0000 mg | ORAL_TABLET | Freq: Every day | ORAL | Status: DC
  Administered 2022-01-05: 20 mg via ORAL
  Filled 2022-01-04: qty 1

## 2022-01-04 MED ORDER — ONDANSETRON HCL 4 MG/2ML IJ SOLN: 4.0000 mg | Freq: Four times a day (QID) | INTRAMUSCULAR | Status: DC | PRN

## 2022-01-04 MED ORDER — GABAPENTIN 300 MG PO CAPS
300.0000 mg | ORAL_CAPSULE | Freq: Every day | ORAL | Status: DC
  Administered 2022-01-05: 300 mg via ORAL
  Filled 2022-01-04: qty 1

## 2022-01-04 MED ORDER — LOSARTAN POTASSIUM 25 MG PO TABS
25.0000 mg | ORAL_TABLET | Freq: Every day | ORAL | Status: DC
  Administered 2022-01-04: 25 mg via ORAL
  Filled 2022-01-04: qty 1

## 2022-01-04 MED ORDER — ALBUTEROL SULFATE HFA 108 (90 BASE) MCG/ACT IN AERS
2.0000 | INHALATION_SPRAY | RESPIRATORY_TRACT | Status: DC | PRN
  Administered 2022-01-04: 2 via RESPIRATORY_TRACT
  Filled 2022-01-04: qty 6.7

## 2022-01-04 MED ORDER — UMECLIDINIUM BROMIDE 62.5 MCG/ACT IN AEPB
1.0000 | INHALATION_SPRAY | Freq: Every day | RESPIRATORY_TRACT | Status: DC
  Administered 2022-01-05: 1 via RESPIRATORY_TRACT
  Filled 2022-01-04: qty 7

## 2022-01-04 MED ORDER — ACETAMINOPHEN 325 MG PO TABS
650.0000 mg | ORAL_TABLET | Freq: Four times a day (QID) | ORAL | Status: DC | PRN
  Administered 2022-01-04 – 2022-01-05 (×2): 650 mg via ORAL
  Filled 2022-01-04 (×2): qty 2

## 2022-01-04 MED ORDER — HEPARIN BOLUS VIA INFUSION
5000.0000 [IU] | Freq: Once | INTRAVENOUS | Status: AC
  Administered 2022-01-04: 5000 [IU] via INTRAVENOUS

## 2022-01-04 MED ORDER — HEPARIN (PORCINE) 25000 UT/250ML-% IV SOLN
1400.0000 [IU]/h | INTRAVENOUS | Status: DC
  Administered 2022-01-04 – 2022-01-05 (×2): 1400 [IU]/h via INTRAVENOUS
  Filled 2022-01-04: qty 250

## 2022-01-04 MED ORDER — MONTELUKAST SODIUM 10 MG PO TABS
10.0000 mg | ORAL_TABLET | Freq: Every day | ORAL | Status: DC
  Administered 2022-01-04: 10 mg via ORAL
  Filled 2022-01-04: qty 1

## 2022-01-04 MED ORDER — GABAPENTIN 300 MG PO CAPS
600.0000 mg | ORAL_CAPSULE | Freq: Every day | ORAL | Status: DC
  Administered 2022-01-04: 600 mg via ORAL
  Filled 2022-01-04: qty 2

## 2022-01-04 MED ORDER — LEVOTHYROXINE SODIUM 75 MCG PO TABS
75.0000 ug | ORAL_TABLET | Freq: Every day | ORAL | Status: DC
Start: 2022-01-05 — End: 2022-01-05
  Administered 2022-01-05: 75 ug via ORAL
  Filled 2022-01-04: qty 1

## 2022-01-04 MED ORDER — PANTOPRAZOLE SODIUM 40 MG PO TBEC
40.0000 mg | DELAYED_RELEASE_TABLET | Freq: Every day | ORAL | Status: DC
  Administered 2022-01-05: 40 mg via ORAL
  Filled 2022-01-04: qty 1

## 2022-01-04 MED ORDER — FLUTICASONE FUROATE-VILANTEROL 100-25 MCG/ACT IN AEPB
1.0000 | INHALATION_SPRAY | Freq: Every day | RESPIRATORY_TRACT | Status: DC
  Administered 2022-01-05: 1 via RESPIRATORY_TRACT
  Filled 2022-01-04: qty 28

## 2022-01-04 NOTE — ED Triage Notes (Signed)
Pt to ER with c/o increasing SHOB.  States he has been being treated for PNA since early June.  States that today his PCP sent him for a CT scan and then called him this afternoon and told him to come to ER because he has a blood clot in his lung. Pt wearing 2L [place] for last several weeks with the PNA.

## 2022-01-04 NOTE — Progress Notes (Signed)
ANTICOAGULATION CONSULT NOTE - Initial Consult  Pharmacy Consult for heparin Indication: pulmonary embolus  Allergies  Allergen Reactions   Amoxicillin-Pot Clavulanate Itching   Ibuprofen Hives    Bilateral arm hives after taking ibuprofen   Penicillins Rash   Ciprofloxacin Nausea And Vomiting   Codeine Nausea And Vomiting    Patient Measurements: Height: '5\' 8"'$  (172.7 cm) Weight: 98.9 kg (218 lb) IBW/kg (Calculated) : 68.4 Heparin Dosing Weight: 89 kg  Vital Signs: Temp: 97.7 F (36.5 C) (06/16 1723) Temp Source: Oral (06/16 1723) BP: 165/69 (06/16 1815) Pulse Rate: 62 (06/16 1901)  Labs: Recent Labs    01/04/22 1841 01/04/22 1903  HGB  --  11.2*  HCT  --  33.4*  PLT  --  376  APTT 29  --   LABPROT 13.9  --   INR 1.1  --   CREATININE  --  0.92    Estimated Creatinine Clearance: 66.9 mL/min (by C-G formula based on SCr of 0.92 mg/dL).   Medical History: Past Medical History:  Diagnosis Date   Arthritis    Asthma    Chronic back pain    COPD (chronic obstructive pulmonary disease) (HCC)    Depression    GERD (gastroesophageal reflux disease)    Hyperlipidemia    Hypertension    Hypothyroidism    OSA on CPAP    Skin cancer    Sleep apnea    uses CPAP nightly    Medications:  Scheduled:   heparin  5,000 Units Intravenous Once    Assessment: Pt  with c/o increasing SHOB.  Today his PCP sent him for a CT scan and then called him this afternoon and told him to come to ER because he has a blood clot in his lung.    Goal of Therapy:  Heparin level 0.3-0.7 units/ml Monitor platelets by anticoagulation protocol: Yes   Plan:  Give 5000 units bolus x 1 Start heparin infusion at 1400 units/hr Check anti-Xa level in 8 hours and daily while on heparin Continue to monitor H&H and platelets  Blenda Nicely, RPh Clinical Pharmacist  01/04/2022,7:49 PM

## 2022-01-04 NOTE — H&P (Signed)
History and Physical    Patient: Cristian Yang DOB: 09-Jul-1935 DOA: 01/04/2022 DOS: the patient was seen and examined on 01/04/2022 PCP: Pablo Lawrence, NP  Patient coming from: Home  Chief Complaint:  Chief Complaint  Patient presents with   Shortness of Breath   HPI: Cristian Yang is a 87 y.o. male with medical history significant of hypertension, COPD on home oxygen at 2 LPM at night, hyperlipidemia, GERD, depression and OSA on CPAP who presents to the emergency department due to more than 1 week of worsening of increasing shortness of breath.  Patient has history of COPD and uses supplemental oxygen at night, however, he complained of URI symptoms including cough, chills, body aches and presented to the emergency department on 6/7, he was diagnosed to have pneumonia and was going to be admitted, but patient refused admission and left AMA, antibiotics was prescribed (though patient complained of allergic reaction to the antibiotics prescribed), but was changed to another antibiotics.  Unfortunately, patient continues to have shortness of breath and return to his PCP who ordered CT angiography of chest which showed pulmonary embolism and was asked to go to the ED for further evaluation and management.  ED Course:  In the emergency department, he was intermittently tachypneic, BP was 135/70 and other vital signs are within normal range.  O2 sat was 97% on supplemental oxygen at 2 LPM work-up in the ED showed normocytic anemia and normal BMP except for sodium of 134, chloride 97, glucose 111. Chest x-ray showed slight increase interstitial markings with [place] overlying patchy airspace opacity. CT angiography chest showed pulmonary thromboembolism involving the right lower lobe pulmonary arteries.  No CT evidence of right heart strain (Care Everywhere) Patient was started on a heparin drip, hospitalist was asked to admit patient for further evaluation and management.  Review of  Systems: Review of systems as noted in the HPI. All other systems reviewed and are negative.   Past Medical History:  Diagnosis Date   Arthritis    Asthma    Chronic back pain    COPD (chronic obstructive pulmonary disease) (HCC)    Depression    GERD (gastroesophageal reflux disease)    Hyperlipidemia    Hypertension    Hypothyroidism    OSA on CPAP    Skin cancer    Sleep apnea    uses CPAP nightly   Past Surgical History:  Procedure Laterality Date   CATARACT EXTRACTION W/PHACO Left 02/13/2016   Procedure: CATARACT EXTRACTION PHACO AND INTRAOCULAR LENS PLACEMENT ([place]);  Surgeon: Rutherford Guys, MD;  Location: [facility];  Service: Ophthalmology;  Laterality: Left;  CDE: 9.23   CATARACT EXTRACTION W/PHACO Right 05/28/2019   Procedure: CATARACT EXTRACTION PHACO AND INTRAOCULAR LENS PLACEMENT ([facility]);  Surgeon: Baruch Goldmann, MD;  Location: [facility];  Service: Ophthalmology;  Laterality: Right;  CDE: 9.51   NECK SURGERY     cervical disc   PROSTATE SURGERY     TURP, laser   SKIN CANCER EXCISION  2013   skin cancer removed     x3    Social History:  reports that he quit smoking about 18 years ago. His smoking use included cigarettes. He has a 60.00 pack-year smoking history. He has never used smokeless tobacco. He reports that he does not drink alcohol and does not use drugs.   Allergies  Allergen Reactions   Amoxicillin-Pot Clavulanate Itching   Ibuprofen Hives    Bilateral arm hives after taking ibuprofen   Penicillins Rash  Ciprofloxacin Nausea And Vomiting   Codeine Nausea And Vomiting    Family History  Problem Relation Age of Onset   Diabetes Mother    Heart disease Mother    Liver cancer Brother    Bone cancer Sister    Asthma Father     ***  Prior to Admission medications   Medication Sig Start Date End Date Taking? Authorizing Provider  albuterol (PROVENTIL) (2.5 MG/3ML) 0.083% nebulizer solution USE (1) IN NEBULIZER EVERY 6 HOURS AS NEEDED FOR SHORTNESS OF  BREATH OR WHEEZING. Patient taking differently: Take 2.5 mg by nebulization every 6 (six) hours as needed for wheezing or shortness of breath. 08/14/21  Yes Chesley Mires, MD  amLODipine (NORVASC) 2.5 MG tablet Take 2.5 mg by mouth daily. 07/13/20  Yes [provider]  aspirin EC 81 MG EC tablet Take 1 tablet (81 mg total) by mouth daily with breakfast. 09/01/19  Yes Emokpae, Courage, MD  calcium elemental as carbonate (BARIATRIC TUMS ULTRA) 400 MG chewable tablet Chew 3,000 mg by mouth at bedtime.   Yes [provider]  cefUROXime (CEFTIN) 500 MG tablet Take 500 mg by mouth 2 (two) times daily. 01/02/22  Yes [provider]  citalopram (CELEXA) 20 MG tablet Take 1 tablet (20 mg total) by mouth at bedtime. 10/15/18  Yes Orson Eva J, DO  docusate sodium (COLACE) 100 MG capsule TAKE 1 CAPSULE BY MOUTH IN THE MORNING Patient taking differently: Take 100 mg by mouth daily. 01/08/19  Yes Harriet Butte, DO  Fluticasone-Umeclidin-Vilant (TRELEGY ELLIPTA) 100-62.5-25 MCG/ACT AEPB Inhale 1 puff into the lungs daily. 08/14/21  Yes Chesley Mires, MD  gabapentin (NEURONTIN) 300 MG capsule TAKE 1 CAPSULE BY MOUTH IN THE MORNING TAKE 2 CAPSULE IN THE EVENING Patient taking differently: Take 300 mg by mouth 3 (three) times daily. TAKE 1 CAPSULE BY MOUTH IN THE MORNING TAKE 2 CAPSULE IN THE EVENING 05/01/18  Yes Harriet Butte, DO  guaiFENesin (MUCINEX) 600 MG 12 hr tablet Take 1 tablet (600 mg total) by mouth 2 (two) times daily. 09/01/19  Yes Roxan Hockey, MD  HYDROcodone-acetaminophen (NORCO/VICODIN) 5-325 MG tablet Take 1 tablet by mouth every 6 (six) hours as needed for moderate pain or severe pain. 08/30/19  Yes Rolland Porter, MD  levothyroxine (SYNTHROID, LEVOTHROID) 75 MCG tablet Take 1 tablet (75 mcg total) by mouth daily before breakfast. 10/15/18  Yes Orson Eva J, DO  lidocaine (XYLOCAINE) 5 % ointment Apply 1 application topically 3 (three) times daily as needed. Apply to affected  area Patient taking differently: Apply 1 application  topically 3 (three) times daily as needed for mild pain. Apply to affected area 11/21/17  Yes Nuala Alpha, MD  losartan (COZAAR) 50 MG tablet Take 0.5 tablets (25 mg total) by mouth at bedtime. 07/13/21  Yes Johnson, Clanford L, MD  methocarbamol (ROBAXIN) 750 MG tablet Take 750-1,500 mg by mouth 3 (three) times daily as needed for muscle spasms. 05/30/20  Yes [provider]  mometasone (NASONEX) 50 MCG/ACT nasal spray Place 2 sprays into the nose daily. 08/14/21  Yes Chesley Mires, MD  montelukast (SINGULAIR) 10 MG tablet Take 1 tablet (10 mg total) by mouth at bedtime. 04/06/20  Yes Chesley Mires, MD  multivitamin (ONE-A-DAY MEN'S) TABS tablet Take 1 tablet by mouth every morning. 10/15/18  Yes Bufford Lope, DO  nystatin-triamcinolone (MYCOLOG II) cream Apply 1 Application topically daily as needed. 01/02/22  Yes [provider]  omeprazole (PRILOSEC) 20 MG capsule Take 1 capsule (20 mg  total) by mouth every morning. 09/01/19  Yes Emokpae, Courage, MD  pravastatin (PRAVACHOL) 20 MG tablet Take 20 mg by mouth daily. 05/11/21  Yes [provider]  amoxicillin-clavulanate (AUGMENTIN) 875-125 MG tablet Take 1 tablet by mouth every 12 (twelve) hours. Patient not taking: Reported on 01/04/2022 12/26/21   Triplett, Tammy, PA-C  azithromycin (ZITHROMAX) 250 MG tablet Take first 2 tablets together, then 1 every day until finished. Patient not taking: Reported on 01/04/2022 12/26/21   Triplett, Tammy, PA-C  ondansetron (ZOFRAN) 4 MG tablet Take 1 tablet (4 mg total) by mouth as needed for nausea or vomiting. Patient not taking: Reported on 01/04/2022 06/03/21   Azucena Cecil, PA-C    Physical Exam: BP (!) 139/100   Pulse 61   Temp 98.3 F (36.8 C) (Oral)   Resp (!) 21   Ht '5\' 8"'$  (1.727 m)   Wt 96.8 kg   SpO2 93%   BMI 32.45 kg/m   General: 87 y.o. year-old male well developed well nourished in no acute distress.  Alert  and oriented x3. HEENT: NCAT, EOMI Neck: Supple, trachea medial Cardiovascular: Regular rate and rhythm with no rubs or gallops.  No thyromegaly or JVD noted.  No lower extremity edema. 2/4 pulses in all 4 extremities. Respiratory: Clear to auscultation with no wheezes or rales. Good inspiratory effort. Abdomen: Soft, nontender nondistended with normal bowel sounds x4 quadrants. Muskuloskeletal: No cyanosis, clubbing or edema noted bilaterally Neuro: CN II-XII intact, strength 5/5 x 4, sensation, reflexes intact Skin: No ulcerative lesions noted or rashes Psychiatry: Judgement and insight appear normal. Mood is appropriate for condition and setting          Labs on Admission:  Basic Metabolic Panel: Recent Labs  Lab 01/04/22 1903  NA 134*  K 4.3  CL 97*  CO2 30  GLUCOSE 111*  BUN 13  CREATININE 0.92  CALCIUM 8.8*   Liver Function Tests: Recent Labs  Lab 01/04/22 1903  AST 25  ALT 26  ALKPHOS 55  BILITOT 0.5  PROT 6.9  ALBUMIN 3.6   No results for input(s): "LIPASE", "AMYLASE" in the last 168 hours. No results for input(s): "AMMONIA" in the last 168 hours. CBC: Recent Labs  Lab 01/04/22 1903  WBC 7.7  NEUTROABS 4.4  HGB 11.2*  HCT 33.4*  MCV 96.0  PLT 376   Cardiac Enzymes: No results for input(s): "CKTOTAL", "CKMB", "CKMBINDEX", "TROPONINI" in the last 168 hours.  BNP (last 3 results) No results for input(s): "BNP" in the last 8760 hours.  ProBNP (last 3 results) No results for input(s): "PROBNP" in the last 8760 hours.  CBG: No results for input(s): "GLUCAP" in the last 168 hours.  Radiological Exams on Admission: DG Chest 2 View  Result Date: 01/04/2022 CLINICAL DATA:  sob EXAM: CHEST - 2 VIEW COMPARISON:  Chest x-ray 12/26/2021 trauma chest x-ray 07/13/2021 FINDINGS: The heart and mediastinal contours are within normal limits. Slightly increased interstitial markings. [place] overlying patchy airspace opacity. No pleural effusion. No pneumothorax. No  acute osseous abnormality. IMPRESSION: Slightly increased interstitial markings with query overlying patchy airspace opacity. Consider CT chest for further evaluation. Electronically Signed   By: Iven Finn M.D.   On: 01/04/2022 18:31    EKG: I independently viewed the EKG done and my findings are as followed:  NSR at a rate of 66bpm with RBBB and LAFB  Assessment/Plan Present on Admission:  Acute pulmonary embolism ([place])  Restless leg  Mixed hyperlipidemia  Acquired hypothyroidism  Chronic obstructive pulmonary disease (HCC)  Principal Problem:   Acute pulmonary embolism (HCC) Active Problems:   OSA on CPAP   Essential hypertension   Chronic obstructive pulmonary disease (HCC)   Restless leg   Acute on chronic respiratory failure with hypoxia (HCC)   Mixed hyperlipidemia   Acquired hypothyroidism   Depression   Acute on chronic with frequent hypoxia in the setting of acute pulmonary embolism Patient presented with worsening shortness of breath CT angiography chest with contrast showed pulmonary thromboembolism involving the right lower lobe pulmonary arteries.   Patient was started on IV heparin drip with plan to transition to [place] in the morning Bilateral lower extremity ultrasound will be done in the morning Echocardiogram will be done in the morning  COPD not in acute exacerbation Continue Breo Ellipta, Singulair  Essential hypertension Continue amlodipine and losartan  Mixed hyperlipidemia Continue Pravachol  OSA on CPAP Continue CPAP  Restless leg syndrome Continue Neurontin  Depression Continue citalopram  Hypothyroidism Continue Synthroid  GERD Continue Protonix   DVT prophylaxis: Heparin drip  Code Status: Full code  Consults: None  Family Communication: None at bedside  Severity of Illness: The appropriate patient status for this patient is OBSERVATION. Observation status is judged to be reasonable and necessary in order to provide the  required intensity of service to ensure the patient's safety. [name] presenting symptoms, physical exam findings, and initial radiographic and laboratory data in the context of their medical condition is felt to place them at decreased risk for further clinical deterioration. Furthermore, it is anticipated that the patient will be medically stable for discharge from the hospital within 2 midnights of admission.   Author: Bernadette Hoit, DO 01/04/2022 11:49 PM  For on call review [email or web address].

## 2022-01-05 ENCOUNTER — Observation Stay (HOSPITAL_COMMUNITY): Payer: Medicare Other

## 2022-01-05 ENCOUNTER — Observation Stay (HOSPITAL_BASED_OUTPATIENT_CLINIC_OR_DEPARTMENT_OTHER): Payer: Medicare Other

## 2022-01-05 DIAGNOSIS — I2699 Other pulmonary embolism without acute cor pulmonale: Secondary | ICD-10-CM | POA: Diagnosis not present

## 2022-01-05 DIAGNOSIS — Z9989 Dependence on other enabling machines and devices: Secondary | ICD-10-CM | POA: Diagnosis not present

## 2022-01-05 DIAGNOSIS — I2609 Other pulmonary embolism with acute cor pulmonale: Secondary | ICD-10-CM

## 2022-01-05 DIAGNOSIS — E039 Hypothyroidism, unspecified: Secondary | ICD-10-CM | POA: Diagnosis not present

## 2022-01-05 DIAGNOSIS — J9621 Acute and chronic respiratory failure with hypoxia: Secondary | ICD-10-CM | POA: Diagnosis not present

## 2022-01-05 DIAGNOSIS — G4733 Obstructive sleep apnea (adult) (pediatric): Secondary | ICD-10-CM | POA: Diagnosis not present

## 2022-01-05 DIAGNOSIS — I10 Essential (primary) hypertension: Secondary | ICD-10-CM | POA: Diagnosis not present

## 2022-01-05 LAB — ECHOCARDIOGRAM COMPLETE
AR max vel: 1.85 cm2
AV Area VTI: 1.74 cm2
AV Area mean vel: 1.65 cm2
AV Mean grad: 13 mmHg
AV Peak grad: 21.2 mmHg
Ao pk vel: 2.3 m/s
Area-P 1/2: 3.99 cm2
Calc EF: 53.2 %
Height: 68 in
S' Lateral: 3.9 cm
Single Plane A2C EF: 51.6 %
Single Plane A4C EF: 59.8 %
Weight: 3414.48 oz

## 2022-01-05 LAB — CBC
HCT: 29.5 % — ABNORMAL LOW (ref 39.0–52.0)
Hemoglobin: 10.1 g/dL — ABNORMAL LOW (ref 13.0–17.0)
MCH: 32.5 pg (ref 26.0–34.0)
MCHC: 34.2 g/dL (ref 30.0–36.0)
MCV: 94.9 fL (ref 80.0–100.0)
Platelets: 339 10*3/uL (ref 150–400)
RBC: 3.11 MIL/uL — ABNORMAL LOW (ref 4.22–5.81)
RDW: 12.9 % (ref 11.5–15.5)
WBC: 6.9 10*3/uL (ref 4.0–10.5)
nRBC: 0 % (ref 0.0–0.2)

## 2022-01-05 LAB — COMPREHENSIVE METABOLIC PANEL
ALT: 21 U/L (ref 0–44)
AST: 21 U/L (ref 15–41)
Albumin: 3.1 g/dL — ABNORMAL LOW (ref 3.5–5.0)
Alkaline Phosphatase: 49 U/L (ref 38–126)
Anion gap: 6 (ref 5–15)
BUN: 11 mg/dL (ref 8–23)
CO2: 29 mmol/L (ref 22–32)
Calcium: 8.6 mg/dL — ABNORMAL LOW (ref 8.9–10.3)
Chloride: 101 mmol/L (ref 98–111)
Creatinine, Ser: 0.75 mg/dL (ref 0.61–1.24)
GFR, Estimated: >60 mL/min (ref >60)
Glucose, Bld: 100 mg/dL — ABNORMAL HIGH (ref 70–99)
Potassium: 4 mmol/L (ref 3.5–5.1)
Sodium: 136 mmol/L (ref 135–145)
Total Bilirubin: 0.3 mg/dL (ref 0.3–1.2)
Total Protein: 6 g/dL — ABNORMAL LOW (ref 6.5–8.1)

## 2022-01-05 LAB — HEPARIN LEVEL (UNFRACTIONATED)
Heparin Unfractionated: 0.44 [IU]/mL (ref 0.30–0.70)
Heparin Unfractionated: 0.65 IU/mL (ref 0.30–0.70)

## 2022-01-05 LAB — PHOSPHORUS: Phosphorus: 3.7 mg/dL (ref 2.5–4.6)

## 2022-01-05 LAB — MAGNESIUM: Magnesium: 2.3 mg/dL (ref 1.7–2.4)

## 2022-01-05 LAB — MRSA NEXT GEN BY PCR, NASAL: MRSA by PCR Next Gen: NOT DETECTED

## 2022-01-05 MED ORDER — ELIQUIS DVT/PE STARTER PACK 5 MG PO TBPK: ORAL_TABLET | ORAL | 0 refills | Status: DC

## 2022-01-05 MED ORDER — AMLODIPINE BESYLATE 5 MG PO TABS: 5.0000 mg | ORAL_TABLET | Freq: Every day | ORAL | 3 refills | Status: DC

## 2022-01-05 MED ORDER — LOSARTAN POTASSIUM 50 MG PO TABS: 25.0000 mg | ORAL_TABLET | Freq: Every day | ORAL | 2 refills | Status: AC

## 2022-01-05 MED ORDER — APIXABAN 5 MG PO TABS
10.0000 mg | ORAL_TABLET | Freq: Once | ORAL | Status: DC
  Filled 2022-01-05: qty 2

## 2022-01-05 MED ORDER — OMEPRAZOLE 20 MG PO CPDR: 20.0000 mg | DELAYED_RELEASE_CAPSULE | Freq: Every morning | ORAL | 3 refills | Status: DC

## 2022-01-05 MED ORDER — CHLORHEXIDINE GLUCONATE CLOTH 2 % EX PADS
6.0000 | MEDICATED_PAD | Freq: Every day | CUTANEOUS | Status: DC
  Administered 2022-01-05: 6 via TOPICAL

## 2022-01-05 MED ORDER — APIXABAN 5 MG PO TABS
5.0000 mg | ORAL_TABLET | Freq: Two times a day (BID) | ORAL | 5 refills | Status: DC
Start: 2022-02-01 — End: 2023-03-07

## 2022-01-05 MED ORDER — TRELEGY ELLIPTA 100-62.5-25 MCG/ACT IN AEPB: 1.0000 | INHALATION_SPRAY | Freq: Every day | RESPIRATORY_TRACT | 11 refills | Status: DC

## 2022-01-05 NOTE — Discharge Summary (Signed)
Cristian Cristian, is a 87 y.o. male  DOB Oct 22, 1934  MRN [id number].  Admission date:  01/04/2022  Admitting Physician  Bernadette Hoit, DO  Discharge Date:  01/05/2022   Primary MD  Pablo Lawrence, NP  Recommendations for primary care physician for things to follow:   1)Avoid ibuprofen/Advil/Aleve/Motrin/Goody Powders/Naproxen/BC powders/Meloxicam/Diclofenac/Indomethacin and other Nonsteroidal anti-inflammatory medications as these will make you more likely to bleed and can cause stomach ulcers, can also cause Kidney problems.   2) you are taking Eliquis/apixaban which is a blood thinner so be careful not to hurt yourself as you might bleed more -Please call if any concerns about bleeding including blood in your stool which may cause your stool to be dark or red and blood in your urine or nosebleeds  3)you need oxygen at home at 2 L via nasal cannula continuously while awake and while asleep--- smoking or having open fires around oxygen can cause fire, significant injury and death  4)follow up with Pablo Lawrence, NP in 1 week for recheck and reevaluation  Admission Diagnosis  Acute pulmonary embolism ([place]) [I26.99]   Discharge Diagnosis  Acute pulmonary embolism ([place]) [I26.99]   Principal Problem:   Acute pulmonary embolism ([place]) Active Problems:   OSA on CPAP   Essential hypertension   Chronic obstructive pulmonary disease (HCC)   Restless leg   Acute on chronic respiratory failure with hypoxia ([place])   Mixed hyperlipidemia   Acquired hypothyroidism   Depression      Past Medical History:  Diagnosis Date   Arthritis    Asthma    Chronic back pain    COPD (chronic obstructive pulmonary disease) (HCC)    Depression    GERD (gastroesophageal reflux disease)    Hyperlipidemia    Hypertension    Hypothyroidism    OSA on CPAP    Skin cancer    Sleep apnea    uses CPAP nightly    Past  Surgical History:  Procedure Laterality Date   CATARACT EXTRACTION W/PHACO Left 02/13/2016   Procedure: CATARACT EXTRACTION PHACO AND INTRAOCULAR LENS PLACEMENT ([place]);  Surgeon: Rutherford Guys, MD;  Location: [facility];  Service: Ophthalmology;  Laterality: Left;  CDE: 9.23   CATARACT EXTRACTION W/PHACO Right 05/28/2019   Procedure: CATARACT EXTRACTION PHACO AND INTRAOCULAR LENS PLACEMENT ([facility]);  Surgeon: Baruch Goldmann, MD;  Location: [facility];  Service: Ophthalmology;  Laterality: Right;  CDE: 9.51   NECK SURGERY     cervical disc   PROSTATE SURGERY     TURP, laser   SKIN CANCER EXCISION  2013   skin cancer removed     x3      HPI  from the history and physical done on the day of admission:   HPI: Cristian Cristian is a 87 y.o. male with medical history significant of hypertension, COPD on home oxygen at 2 LPM at night, hyperlipidemia, GERD, depression and OSA on CPAP who presents to the emergency department due to more than 1 week of worsening of increasing shortness of breath.  Patient has history of COPD and uses supplemental oxygen at night, however, he complained of URI symptoms including cough, chills, body aches and presented to the emergency department on 6/7, he was diagnosed to have pneumonia and was going to be admitted, but patient refused admission and left AMA, antibiotics was prescribed (though patient complained of allergic reaction to the antibiotics prescribed), but was changed to another antibiotics.  Unfortunately, patient continues to have shortness of breath and return to his PCP who ordered CT angiography of chest which showed pulmonary embolism and was asked to go to the ED for further evaluation and management.   ED Course:  In the emergency department, he was intermittently tachypneic, BP was 135/70 and other vital signs are within normal range.  O2 sat was 97% on supplemental oxygen at 2 LPM work-up in the ED showed normocytic anemia and normal BMP except for sodium of 134,  chloride 97, glucose 111. Chest x-ray showed slight increase interstitial markings with [place] overlying patchy airspace opacity. CT angiography chest showed pulmonary thromboembolism involving the right lower lobe pulmonary arteries.  No CT evidence of right heart strain (Care Everywhere) Patient was started on a heparin drip, hospitalist was asked to admit patient for further evaluation and management.   Review of Systems: Review of systems as noted in the HPI. All other systems reviewed and are negative.    Hospital Course:   Assessment and Plan:  1)Acute Pulm Embolism--CTA chest from [facility] done on 01/04/2022 showed Pulmonary thromboembolism involving the right lower lobe pulmonary arteries. - No CT evidence of right heart strain.   Echo with EF of 60 to 65% without regional wall motion abnormalities, pulmonary artery pressures normal, patient does have grade 1 diastolic dysfunction -Lower extremity venous Dopplers pending -Treated with IV heparin switch to oral Eliquis -Patient will need anticoagulation for at least 6 months may be lifelong as no trigger for his PE was identified   2)Acute on chronic hypoxic respiratory failure--- PTA patient used oxygen only at night with his CPAP -On admission patient required continuous oxygen -He was weaned off oxygen on 01/05/2022 -Post ambulation O2 sats 90 to 92% on room air -Okay to continue to use oxygen nightly with his CPAP at home  3)HTN--okay to discharge on amlodipine and losartan  4) COPD----stable, patient is a reformed smoker, continue bronchodilators  5) class II obesity/OSA----continue CPAP nightly with oxygen bled in  6) hypothyroidism--- continue levothyroxine  7) depressive disorder stable continue escitalopram  8) GERD--- continue Protonix no bleeding concerns  9)HLD--continue pravastatin  Discharge Condition: Stable  Follow UP   Follow-up Information     Pablo Lawrence, NP Follow up in 1  week(s).   Specialty: Adult Health Nurse Practitioner Contact information: [address] [address] [phone]                 Diet and Activity recommendation:  As advised  Discharge Instructions    Discharge Instructions     Call MD for:  difficulty breathing, headache or visual disturbances   Complete by: As directed    Call MD for:  persistant dizziness or light-headedness   Complete by: As directed    Call MD for:  persistant nausea and vomiting   Complete by: As directed    Call MD for:  severe uncontrolled pain   Complete by: As directed    Call MD for:  temperature >100.4   Complete by: As directed  Diet - low sodium heart healthy   Complete by: As directed    Discharge instructions   Complete by: As directed    1)Avoid ibuprofen/Advil/Aleve/Motrin/Goody Powders/Naproxen/BC powders/Meloxicam/Diclofenac/Indomethacin and other Nonsteroidal anti-inflammatory medications as these will make you more likely to bleed and can cause stomach ulcers, can also cause Kidney problems.   2) you are taking Eliquis/apixaban which is a blood thinner so be careful not to hurt yourself as you might bleed more -Please call if any concerns about bleeding including blood in your stool which may cause your stool to be dark or red and blood in your urine or nosebleeds  3)you need oxygen at home at 2 L via nasal cannula continuously while awake and while asleep--- smoking or having open fires around oxygen can cause fire, significant injury and death  4)follow up with Pablo Lawrence, NP in 1 week for recheck and reevaluation   Increase activity slowly   Complete by: As directed        Discharge Medications     Allergies as of 01/05/2022       Reactions   Amoxicillin-pot Clavulanate Itching   Ibuprofen Hives   Bilateral arm hives after taking ibuprofen   Penicillins Rash   Ciprofloxacin Nausea And Vomiting   Codeine Nausea And Vomiting         Medication List     STOP taking these medications    amoxicillin-clavulanate 875-125 MG tablet Commonly known as: AUGMENTIN   aspirin EC 81 MG tablet   azithromycin 250 MG tablet Commonly known as: ZITHROMAX   cefUROXime 500 MG tablet Commonly known as: CEFTIN   ondansetron 4 MG tablet Commonly known as: ZOFRAN       TAKE these medications    albuterol (2.5 MG/3ML) 0.083% nebulizer solution Commonly known as: PROVENTIL USE (1) IN NEBULIZER EVERY 6 HOURS AS NEEDED FOR SHORTNESS OF BREATH OR WHEEZING. What changed:  how much to take how to take this when to take this reasons to take this additional instructions   amLODipine 5 MG tablet Commonly known as: NORVASC Take 1 tablet (5 mg total) by mouth daily. What changed:  medication strength how much to take   Eliquis DVT/PE Starter Pack Generic drug: Apixaban Starter Pack ('10mg'$  and '5mg'$ ) Take as directed on package: start with two-'5mg'$  tablets twice daily for 7 days. On day 8, switch to one-'5mg'$  tablet twice daily.   apixaban 5 MG Tabs tablet Commonly known as: ELIQUIS Take 1 tablet (5 mg total) by mouth 2 (two) times daily. Start this after you complete the initial starter pack Start taking on: February 01, 2022   calcium elemental as carbonate 400 MG chewable tablet Commonly known as: BARIATRIC TUMS ULTRA Chew 3,000 mg by mouth at bedtime.   citalopram 20 MG tablet Commonly known as: CELEXA Take 1 tablet (20 mg total) by mouth at bedtime.   docusate sodium 100 MG capsule Commonly known as: COLACE TAKE 1 CAPSULE BY MOUTH IN THE MORNING What changed: when to take this   gabapentin 300 MG capsule Commonly known as: NEURONTIN TAKE 1 CAPSULE BY MOUTH IN THE MORNING TAKE 2 CAPSULE IN THE EVENING What changed:  how much to take how to take this when to take this   guaiFENesin 600 MG 12 hr tablet Commonly known as: MUCINEX Take 1 tablet (600 mg total) by mouth 2 (two) times daily.    HYDROcodone-acetaminophen 5-325 MG tablet Commonly known as: NORCO/VICODIN Take 1 tablet by mouth every 6 (six) hours as  needed for moderate pain or severe pain.   levothyroxine 75 MCG tablet Commonly known as: SYNTHROID Take 1 tablet (75 mcg total) by mouth daily before breakfast.   lidocaine 5 % ointment Commonly known as: XYLOCAINE Apply 1 application topically 3 (three) times daily as needed. Apply to affected area What changed: reasons to take this   losartan 50 MG tablet Commonly known as: COZAAR Take 0.5 tablets (25 mg total) by mouth at bedtime. Start taking on: January 07, 2022 What changed: These instructions start on January 07, 2022. If you are unsure what to do until then, ask your doctor or other care provider.   methocarbamol 750 MG tablet Commonly known as: ROBAXIN Take 750-1,500 mg by mouth 3 (three) times daily as needed for muscle spasms.   mometasone 50 MCG/ACT nasal spray Commonly known as: NASONEX Place 2 sprays into the nose daily.   montelukast 10 MG tablet Commonly known as: SINGULAIR Take 1 tablet (10 mg total) by mouth at bedtime.   multivitamin Tabs tablet Take 1 tablet by mouth every morning.   nystatin-triamcinolone cream Commonly known as: MYCOLOG II Apply 1 Application topically daily as needed.   omeprazole 20 MG capsule Commonly known as: PRILOSEC Take 1 capsule (20 mg total) by mouth every morning.   pravastatin 20 MG tablet Commonly known as: PRAVACHOL Take 20 mg by mouth daily.   Trelegy Ellipta 100-62.5-25 MCG/ACT Aepb Generic drug: Fluticasone-Umeclidin-Vilant Inhale 1 puff into the lungs daily.        Major procedures and Radiology Reports - PLEASE review detailed and final reports for all details, in brief -   ECHOCARDIOGRAM COMPLETE  Result Date: 01/05/2022    ECHOCARDIOGRAM REPORT   Patient Name:   Cristian Cristian Date of Exam: 01/05/2022 Medical Rec #:  [id number]      Height:       68.0 in Accession #:    [id number]      Weight:       213.4 lb Date of Birth:  [birth year]      BSA:          2.101 m Patient Age:    64 years       BP:           177/90 mmHg Patient Gender: M              HR:           67 bpm. Exam Location:  [name] Procedure: 2D Echo, Cardiac Doppler and Color Doppler Indications:    Pulmonary Embolus I26.09  History:        Patient has prior history of Echocardiogram examinations, most                 recent 08/29/2020. COPD; Risk Factors:Dyslipidemia, Hypertension,                 Sleep Apnea and GERD.  Sonographer:    Bernadene Person RDCS Referring Phys: [id number] OLADAPO ADEFESO IMPRESSIONS  1. Left ventricular ejection fraction, by estimation, is 60 to 65%. The left ventricle has normal function. The left ventricle has no regional wall motion abnormalities. Left ventricular diastolic parameters are consistent with Grade I diastolic dysfunction (impaired relaxation).  2. Right ventricular systolic function is hyperdynamic. The right ventricular size is normal. There is normal pulmonary artery systolic pressure. The estimated right ventricular systolic pressure is 82.5 mmHg.  3. Left atrial size was moderately dilated.  4. Right atrial size was moderately dilated.  5.  The mitral valve is grossly normal. No evidence of mitral valve regurgitation.  6. The aortic valve is tricuspid. Aortic valve regurgitation is not visualized. Mild aortic valve stenosis. Aortic valve area, by VTI measures 1.74 cm. Aortic valve mean gradient measures 13.0 mmHg. Aortic valve Vmax measures 2.30 m/s. DI is 0.50.  7. Aortic dilatation noted. There is borderline dilatation of the aortic root, measuring 39 mm. Comparison(s): Changes from prior study are noted. 08/29/2020: LVEF 60-65%, mild AS. FINDINGS  Left Ventricle: Left ventricular ejection fraction, by estimation, is 60 to 65%. The left ventricle has normal function. The left ventricle has no regional wall motion abnormalities. The left ventricular internal cavity size was normal in size.  There is  no left ventricular hypertrophy. Left ventricular diastolic parameters are consistent with Grade I diastolic dysfunction (impaired relaxation). Indeterminate filling pressures. Right Ventricle: The right ventricular size is normal. No increase in right ventricular wall thickness. Right ventricular systolic function is hyperdynamic. There is normal pulmonary artery systolic pressure. The tricuspid regurgitant velocity is 2.35 m/s, and with an assumed right atrial pressure of 8 mmHg, the estimated right ventricular systolic pressure is 38.7 mmHg. Left Atrium: Left atrial size was moderately dilated. Right Atrium: Right atrial size was moderately dilated. Pericardium: There is no evidence of pericardial effusion. Mitral Valve: The mitral valve is grossly normal. No evidence of mitral valve regurgitation. Tricuspid Valve: The tricuspid valve is grossly normal. Tricuspid valve regurgitation is trivial. Aortic Valve: The aortic valve is tricuspid. Aortic valve regurgitation is not visualized. Mild aortic stenosis is present. Aortic valve mean gradient measures 13.0 mmHg. Aortic valve peak gradient measures 21.2 mmHg. Aortic valve area, by VTI measures 1.74 cm. Pulmonic Valve: The pulmonic valve was normal in structure. Pulmonic valve regurgitation is not visualized. Aorta: Aortic dilatation noted. There is borderline dilatation of the aortic root, measuring 39 mm. IAS/Shunts: No atrial level shunt detected by color flow Doppler.  LEFT VENTRICLE PLAX 2D LVIDd:         5.50 cm      Diastology LVIDs:         3.90 cm      LV e' medial:    5.27 cm/s LV PW:         1.00 cm      LV E/e' medial:  19.7 LV IVS:        1.00 cm      LV e' lateral:   9.24 cm/s LVOT diam:     2.10 cm      LV E/e' lateral: 11.3 LV SV:         94 LV SV Index:   45 LVOT Area:     3.46 cm  LV Volumes (MOD) LV vol d, MOD A2C: 120.0 ml LV vol d, MOD A4C: 117.0 ml LV vol s, MOD A2C: 58.1 ml LV vol s, MOD A4C: 47.0 ml LV SV MOD A2C:     61.9 ml LV  SV MOD A4C:     117.0 ml LV SV MOD BP:      64.5 ml RIGHT VENTRICLE RV S prime:     9.81 cm/s TAPSE (M-mode): 2.3 cm LEFT ATRIUM              Index        RIGHT ATRIUM           Index LA diam:        5.00 cm  2.38 cm/m   RA Area:     25.90  cm LA Vol (A2C):   109.0 ml 51.88 ml/m  RA Volume:   85.20 ml  40.55 ml/m LA Vol (A4C):   87.0 ml  41.41 ml/m LA Biplane Vol: 99.1 ml  47.17 ml/m  AORTIC VALVE AV Area (Vmax):    1.85 cm AV Area (Vmean):   1.65 cm AV Area (VTI):     1.74 cm AV Vmax:           230.00 cm/s AV Vmean:          171.000 cm/s AV VTI:            0.538 m AV Peak Grad:      21.2 mmHg AV Mean Grad:      13.0 mmHg LVOT Vmax:         123.00 cm/s LVOT Vmean:        81.500 cm/s LVOT VTI:          0.271 m LVOT/AV VTI ratio: 0.50  AORTA Ao Root diam: 3.90 cm Ao Asc diam:  3.40 cm MITRAL VALVE                TRICUSPID VALVE MV Area (PHT): 3.99 cm     TR Peak grad:   22.1 mmHg MV Decel Time: 190 msec     TR Vmax:        235.00 cm/s MV E velocity: 104.00 cm/s MV A velocity: 105.00 cm/s  SHUNTS MV E/A ratio:  0.99         Systemic VTI:  0.27 m                             Systemic Diam: 2.10 cm Lyman Bishop MD Electronically signed by Lyman Bishop MD Signature Date/Time: 01/05/2022/3:42:02 PM    Final    DG Chest 2 View  Result Date: 01/04/2022 CLINICAL DATA:  sob EXAM: CHEST - 2 VIEW COMPARISON:  Chest x-ray 12/26/2021 trauma chest x-ray 07/13/2021 FINDINGS: The heart and mediastinal contours are within normal limits. Slightly increased interstitial markings. Query overlying patchy airspace opacity. No pleural effusion. No pneumothorax. No acute osseous abnormality. IMPRESSION: Slightly increased interstitial markings with query overlying patchy airspace opacity. Consider CT chest for further evaluation. Electronically Signed   By: Iven Finn M.D.   On: 01/04/2022 18:31   DG Chest 2 View  Result Date: 12/26/2021 CLINICAL DATA:  Cough, chills and body aches. EXAM: CHEST - 2 VIEW COMPARISON:   Chest radiograph July 13, 2021 FINDINGS: The heart size and mediastinal contours are within normal limits. Aortic atherosclerosis. Patchy right lower lobe airspace opacity. No pleural effusion. No pneumothorax. Partially visualized anterior cervical fusion hardware. Thoracic spondylosis. IMPRESSION: Patchy right lower lobe airspace opacity suspicious for pneumonia. Electronically Signed   By: Dahlia Bailiff M.D.   On: 12/26/2021 13:53    Micro Results  Recent Results (from the past 240 hour(s))  MRSA Next Gen by PCR, Nasal     Status: None   Collection Time: 01/04/22 10:04 PM   Specimen: Nasal Mucosa; Nasal Swab  Result Value Ref Range Status   MRSA by PCR Next Gen NOT DETECTED NOT DETECTED Final    Comment: (NOTE) The GeneXpert MRSA Assay (FDA approved for NASAL specimens only), is one component of a comprehensive MRSA colonization surveillance program. It is not intended to diagnose MRSA infection nor to guide or monitor treatment for MRSA infections. Test performance is not FDA approved in patients less than 2  years old. Performed at [facility], [address] [place], [place] [address]    Today   Subjective    Cristian Cristian today has no new complaints -Ambulated in hallways without significant chest pains or dyspnea on exertion -O2 sats post ambulation on room air was 90 to 92%          Patient has been seen and examined prior to discharge   Objective   Blood pressure (!) 166/78, pulse 67, temperature 97.7 F (36.5 C), temperature source Oral, resp. rate 19, height '5\' 8"'$  (1.727 m), weight 96.8 kg, SpO2 97 %.   Intake/Output Summary (Last 24 hours) at 01/05/2022 1554 Last data filed at 01/05/2022 1400 Gross per 24 hour  Intake 270.98 ml  Output 1250 ml  Net -979.02 ml    Exam Gen:- Awake Alert, no acute distress  HEENT:- [place].AT, No sclera icterus, somewhat hard of hearing Neck-Supple Neck,No JVD,.  Lungs-  CTAB , good air movement bilaterally CV- S1, S2 normal,  regular Abd-  +ve B.Sounds, Abd Soft, No tenderness,    Extremity/Skin:- No  edema,   good pulses Psych-affect is appropriate, oriented x3 Neuro-no new focal deficits, no tremors    Data Review   CBC w Diff:  Lab Results  Component Value Date   WBC 6.9 01/05/2022   HGB 10.1 (L) 01/05/2022   HCT 29.5 (L) 01/05/2022   PLT 339 01/05/2022   LYMPHOPCT 17 01/04/2022   BANDSPCT 18 11/06/2017   MONOPCT 21 01/04/2022   EOSPCT 3 01/04/2022   BASOPCT 1 01/04/2022    CMP:  Lab Results  Component Value Date   NA 136 01/05/2022   NA 141 10/21/2017   K 4.0 01/05/2022   CL 101 01/05/2022   CO2 29 01/05/2022   BUN 11 01/05/2022   BUN 7 (L) 10/21/2017   CREATININE 0.75 01/05/2022   PROT 6.0 (L) 01/05/2022   PROT 6.3 10/21/2017   ALBUMIN 3.1 (L) 01/05/2022   ALBUMIN 4.1 10/21/2017   BILITOT 0.3 01/05/2022   BILITOT 0.3 10/21/2017   ALKPHOS 49 01/05/2022   AST 21 01/05/2022   ALT 21 01/05/2022  .  Total Discharge time is about 33 minutes  Roxan Hockey M.D on 01/05/2022 at 3:54 PM  Go to [email or web address] -  for contact info  [facility] - Office  [phone]

## 2022-01-05 NOTE — Progress Notes (Incomplete)
PROGRESS NOTE     Cristian Yang, is a 87 y.o. male, DOB - 1935-04-06, UJW:[id number]  Admit date - 01/04/2022   Admitting Physician Bernadette Hoit, DO  Outpatient Primary MD for the patient is Pablo Lawrence, NP  LOS - 0  Chief Complaint  Patient presents with   Shortness of Breath        Brief Narrative:   *** No notes on file    -Assessment and Plan: No notes have been filed under this hospital service. Service: Hospitalist  1)Acute PE---- CTA chest at [facility] on 01/04/22 showed  Acute Pulmonary thromboembolism involving the right lower lobe pulmonary arteries. No CT evidence of right heart strain.    *** Disposition/Need for in-Hospital Stay- patient unable to be discharged at this time due to ****  Status is: Inpatient ***  Disposition: The patient is from: {From:23814}              Anticipated d/c is to: {To:23815}              Anticipated d/c date is: {Days:23816}              Patient currently {Medically stable:23817} Barriers: Not Clinically Stable- ***  Code Status : *** -  Code Status: Full Code   Family Communication:   *** NA (patient is alert, awake and coherent) ***  DVT Prophylaxis  :   - SCDs ******  SCDs Start: 01/04/22 2205   Lab Results  Component Value Date   PLT 339 01/05/2022    Inpatient Medications  Scheduled Meds:  amLODipine  2.5 mg Oral Daily   aspirin EC  81 mg Oral Q breakfast   Chlorhexidine Gluconate Cloth  6 each Topical Daily   citalopram  20 mg Oral QHS   fluticasone furoate-vilanterol  1 puff Inhalation Daily   And   umeclidinium bromide  1 puff Inhalation Daily   gabapentin  300 mg Oral Daily   gabapentin  600 mg Oral QHS   levothyroxine  75 mcg Oral Q0600   losartan  25 mg Oral QHS   montelukast  10 mg Oral QHS   mouth rinse  15 mL Mouth Rinse 4 times per day   pantoprazole  40 mg Oral Daily   pravastatin  20 mg Oral Daily   Continuous Infusions:  heparin 1,400 Units/hr (01/04/22 2001)   PRN  Meds:.acetaminophen **OR** acetaminophen, albuterol, ondansetron **OR** ondansetron (ZOFRAN) IV, mouth rinse   Anti-infectives (From admission, onward)    None         Subjective: Pal Ballinas today has no fevers, no emesis,  No chest pain,  ***   Objective: Vitals:   01/05/22 0400 01/05/22 0445 01/05/22 0500 01/05/22 0600  BP:  137/73 (!) 151/67 (!) 177/90  Pulse: 61 68 (!) 120 75  Resp:  16 17 (!) 23  Temp:  98.6 F (37 C)    TempSrc:  Oral    SpO2: 95% 100% 92% 95%  Weight:      Height:        Intake/Output Summary (Last 24 hours) at 01/05/2022 0817 Last data filed at 01/05/2022 0600 Gross per 24 hour  Intake 201.09 ml  Output 400 ml  Net -198.91 ml   Filed Weights   01/04/22 1725 01/04/22 2236  Weight: 98.9 kg 96.8 kg    Physical Exam  Gen:- Awake Alert,  *** HEENT:- [place].AT, No sclera icterus Neck-Supple Neck,No JVD,.  Lungs-  CTAB , fair symmetrical air movement CV- S1,  S2 normal, regular  Abd-  +ve B.Sounds, Abd Soft, No tenderness,    Extremity/Skin:- No  edema, pedal pulses present *** Psych-affect is appropriate, oriented x3 Neuro-no new focal deficits, no tremors  Data Reviewed: I have personally reviewed following labs and imaging studies  CBC: Recent Labs  Lab 01/04/22 1903 01/05/22 0410  WBC 7.7 6.9  NEUTROABS 4.4  --   HGB 11.2* 10.1*  HCT 33.4* 29.5*  MCV 96.0 94.9  PLT 376 458   Basic Metabolic Panel: Recent Labs  Lab 01/04/22 1903 01/05/22 0410  NA 134* 136  K 4.3 4.0  CL 97* 101  CO2 30 29  GLUCOSE 111* 100*  BUN 13 11  CREATININE 0.92 0.75  CALCIUM 8.8* 8.6*  MG  --  2.3  PHOS  --  3.7   GFR: Estimated Creatinine Clearance: 76.2 mL/min (by C-G formula based on SCr of 0.75 mg/dL). Liver Function Tests: Recent Labs  Lab 01/04/22 1903 01/05/22 0410  AST 25 21  ALT 26 21  ALKPHOS 55 49  BILITOT 0.5 0.3  PROT 6.9 6.0*  ALBUMIN 3.6 3.1*   Cardiac Enzymes: No results for input(s): "CKTOTAL", "CKMB",  "CKMBINDEX", "TROPONINI" in the last 168 hours. BNP (last 3 results) No results for input(s): "PROBNP" in the last 8760 hours. HbA1C: No results for input(s): "HGBA1C" in the last 72 hours. Sepsis Labs: '@LABRCNTIP'$ (procalcitonin:4,lacticidven:4) ) Recent Results (from the past 240 hour(s))  SARS Coronavirus 2 by RT PCR (hospital order, performed in [facility] hospital lab) *cepheid single result test* Anterior Nasal Swab     Status: None   Collection Time: 12/26/21  1:26 PM   Specimen: Anterior Nasal Swab  Result Value Ref Range Status   SARS Coronavirus 2 by RT PCR NEGATIVE NEGATIVE Final    Comment: (NOTE) SARS-CoV-2 target nucleic acids are NOT DETECTED.  The SARS-CoV-2 RNA is generally detectable in upper and lower respiratory specimens during the acute phase of infection. The lowest concentration of SARS-CoV-2 viral copies this assay can detect is 250 copies / mL. A negative result does not preclude SARS-CoV-2 infection and should not be used as the sole basis for treatment or other patient management decisions.  A negative result may occur with improper specimen collection / handling, submission of specimen other than nasopharyngeal swab, presence of viral mutation(s) within the areas targeted by this assay, and inadequate number of viral copies (<250 copies / mL). A negative result must be combined with clinical observations, patient history, and epidemiological information.  Fact Sheet for Patients:   https://www.patel.info/  Fact Sheet for Healthcare Providers: https://hall.com/  This test is not yet approved or  cleared by the [place] FDA and has been authorized for detection and/or diagnosis of SARS-CoV-2 by FDA under an Emergency Use Authorization (EUA).  This EUA will remain in effect (meaning this test can be used) for the duration of the COVID-19 declaration under Section 564(b)(1) of the Act, 21 U.S.C. section  360bbb-3(b)(1), unless the authorization is terminated or revoked sooner.  Performed at [facility], [address] [place], [place] [address]   Culture, blood (routine x 2)     Status: None   Collection Time: 12/26/21  2:52 PM   Specimen: Right Antecubital; Blood  Result Value Ref Range Status   Specimen Description RIGHT ANTECUBITAL  Final   Special Requests   Final    BOTTLES DRAWN AEROBIC AND ANAEROBIC Blood Culture adequate volume   Culture   Final    NO GROWTH 5  DAYS Performed at [facility], [address] [place], [place] [address]    Report Status 12/31/2021 FINAL  Final  Culture, blood (routine x 2)     Status: None   Collection Time: 12/26/21  2:53 PM   Specimen: Left Antecubital; Blood  Result Value Ref Range Status   Specimen Description LEFT ANTECUBITAL  Final   Special Requests   Final    BOTTLES DRAWN AEROBIC AND ANAEROBIC Blood Culture results may not be optimal due to an excessive volume of blood received in culture bottles   Culture   Final    NO GROWTH 5 DAYS Performed at [facility], [address] [place], [place] [address]    Report Status 12/31/2021 FINAL  Final  MRSA Next Gen by PCR, Nasal     Status: None   Collection Time: 01/04/22 10:04 PM   Specimen: Nasal Mucosa; Nasal Swab  Result Value Ref Range Status   MRSA by PCR Next Gen NOT DETECTED NOT DETECTED Final    Comment: (NOTE) The GeneXpert MRSA Assay (FDA approved for NASAL specimens only), is one component of a comprehensive MRSA colonization surveillance program. It is not intended to diagnose MRSA infection nor to guide or monitor treatment for MRSA infections. Test performance is not FDA approved in patients less than 60 years old. Performed at [facility], [address] [place], [place] [address]       Radiology Studies: DG Chest 2 View  Result Date: 01/04/2022 CLINICAL DATA:  sob EXAM: CHEST - 2 VIEW COMPARISON:  Chest x-ray 12/26/2021 trauma chest x-ray 07/13/2021 FINDINGS:  The heart and mediastinal contours are within normal limits. Slightly increased interstitial markings. Query overlying patchy airspace opacity. No pleural effusion. No pneumothorax. No acute osseous abnormality. IMPRESSION: Slightly increased interstitial markings with query overlying patchy airspace opacity. Consider CT chest for further evaluation. Electronically Signed   By: Iven Finn M.D.   On: 01/04/2022 18:31     Scheduled Meds:  amLODipine  2.5 mg Oral Daily   aspirin EC  81 mg Oral Q breakfast   Chlorhexidine Gluconate Cloth  6 each Topical Daily   citalopram  20 mg Oral QHS   fluticasone furoate-vilanterol  1 puff Inhalation Daily   And   umeclidinium bromide  1 puff Inhalation Daily   gabapentin  300 mg Oral Daily   gabapentin  600 mg Oral QHS   levothyroxine  75 mcg Oral Q0600   losartan  25 mg Oral QHS   montelukast  10 mg Oral QHS   mouth rinse  15 mL Mouth Rinse 4 times per day   pantoprazole  40 mg Oral Daily   pravastatin  20 mg Oral Daily   Continuous Infusions:  heparin 1,400 Units/hr (01/04/22 2001)     LOS: 0 days    Roxan Hockey M.D on 01/05/2022 at 8:17 AM  Go to [email or web address] - for contact info  [facility] - Office  [phone]  If 7PM-7AM, please contact night-coverage [email or web address] 01/05/2022, 8:17 AM

## 2022-01-05 NOTE — Discharge Instructions (Signed)
1)Avoid ibuprofen/Advil/Aleve/Motrin/Goody Powders/Naproxen/BC powders/Meloxicam/Diclofenac/Indomethacin and other Nonsteroidal anti-inflammatory medications as these will make you more likely to bleed and can cause stomach ulcers, can also cause Kidney problems.   2) you are taking Eliquis/apixaban which is a blood thinner so be careful not to hurt yourself as you might bleed more -Please call if any concerns about bleeding including blood in your stool which may cause your stool to be dark or red and blood in your urine or nosebleeds  3)you need oxygen at home at 2 L via nasal cannula continuously while awake and while asleep--- smoking or having open fires around oxygen can cause fire, significant injury and death  4)follow up with Pablo Lawrence, NP in 1 week for recheck and reevaluation

## 2022-01-05 NOTE — Progress Notes (Signed)
ANTICOAGULATION CONSULT NOTE - Initial Consult  Pharmacy Consult for heparin Indication: pulmonary embolus  Allergies  Allergen Reactions   Amoxicillin-Pot Clavulanate Itching   Ibuprofen Hives    Bilateral arm hives after taking ibuprofen   Penicillins Rash   Ciprofloxacin Nausea And Vomiting   Codeine Nausea And Vomiting    Patient Measurements: Height: '5\' 8"'$  (172.7 cm) Weight: 96.8 kg (213 lb 6.5 oz) IBW/kg (Calculated) : 68.4 Heparin Dosing Weight: 89 kg  Vital Signs: Temp: 98.6 F (37 C) (06/17 0100) Temp Source: Oral (06/17 0100) BP: 152/94 (06/17 0100) Pulse Rate: 65 (06/17 0100)  Labs: Recent Labs    01/04/22 1841 01/04/22 1903 01/05/22 0410  HGB  --  11.2* 10.1*  HCT  --  33.4* 29.5*  PLT  --  376 339  APTT 29  --   --   LABPROT 13.9  --   --   INR 1.1  --   --   HEPARINUNFRC  --   --  0.44  CREATININE  --  0.92 0.75     Estimated Creatinine Clearance: 76.2 mL/min (by C-G formula based on SCr of 0.75 mg/dL).   Medical History: Past Medical History:  Diagnosis Date   Arthritis    Asthma    Chronic back pain    COPD (chronic obstructive pulmonary disease) (HCC)    Depression    GERD (gastroesophageal reflux disease)    Hyperlipidemia    Hypertension    Hypothyroidism    OSA on CPAP    Skin cancer    Sleep apnea    uses CPAP nightly    Medications:  Scheduled:   amLODipine  2.5 mg Oral Daily   aspirin EC  81 mg Oral Q breakfast   citalopram  20 mg Oral QHS   fluticasone furoate-vilanterol  1 puff Inhalation Daily   And   umeclidinium bromide  1 puff Inhalation Daily   gabapentin  300 mg Oral Daily   gabapentin  600 mg Oral QHS   levothyroxine  75 mcg Oral Q0600   losartan  25 mg Oral QHS   montelukast  10 mg Oral QHS   mouth rinse  15 mL Mouth Rinse 4 times per day   pantoprazole  40 mg Oral Daily   pravastatin  20 mg Oral Daily    Assessment: Pt  with c/o increasing SHOB.  Today his PCP sent him for a CT scan and then called  him this afternoon and told him to come to ER because he has a blood clot in his lung.   6/17 AM update:  Heparin level therapeutic   Goal of Therapy:  Heparin level 0.3-0.7 units/ml Monitor platelets by anticoagulation protocol: Yes   Plan:  Cont heparin 1400 units/hr 1200 heparin level  Narda Bonds, PharmD, BCPS Clinical Pharmacist Phone: [phone]

## 2022-01-05 NOTE — Progress Notes (Signed)
  Echocardiogram 2D Echocardiogram has been performed.  Fidel Levy 01/05/2022, 2:06 PM

## 2022-01-05 NOTE — Progress Notes (Signed)
ANTICOAGULATION CONSULT NOTE   Pharmacy Consult for heparin Indication: pulmonary embolus  Allergies  Allergen Reactions   Amoxicillin-Pot Clavulanate Itching   Ibuprofen Hives    Bilateral arm hives after taking ibuprofen   Penicillins Rash   Ciprofloxacin Nausea And Vomiting   Codeine Nausea And Vomiting   Patient Measurements: Height: '5\' 8"'$  (172.7 cm) Weight: 96.8 kg (213 lb 6.5 oz) IBW/kg (Calculated) : 68.4 Heparin Dosing Weight: 89 kg  Vital Signs: Temp: 97.7 F (36.5 C) (06/17 1131) Temp Source: Oral (06/17 1131) BP: 159/79 (06/17 1100) Pulse Rate: 71 (06/17 1131)  Labs: Recent Labs    01/04/22 1841 01/04/22 1903 01/05/22 0410 01/05/22 1155  HGB  --  11.2* 10.1*  --   HCT  --  33.4* 29.5*  --   PLT  --  376 339  --   APTT 29  --   --   --   LABPROT 13.9  --   --   --   INR 1.1  --   --   --   HEPARINUNFRC  --   --  0.44 0.65  CREATININE  --  0.92 0.75  --     Estimated Creatinine Clearance: 76.2 mL/min (by C-G formula based on SCr of 0.75 mg/dL).  Medical History: Past Medical History:  Diagnosis Date   Arthritis    Asthma    Chronic back pain    COPD (chronic obstructive pulmonary disease) (HCC)    Depression    GERD (gastroesophageal reflux disease)    Hyperlipidemia    Hypertension    Hypothyroidism    OSA on CPAP    Skin cancer    Sleep apnea    uses CPAP nightly   Medications:  Scheduled:   amLODipine  2.5 mg Oral Daily   aspirin EC  81 mg Oral Q breakfast   Chlorhexidine Gluconate Cloth  6 each Topical Daily   citalopram  20 mg Oral QHS   fluticasone furoate-vilanterol  1 puff Inhalation Daily   And   umeclidinium bromide  1 puff Inhalation Daily   gabapentin  300 mg Oral Daily   gabapentin  600 mg Oral QHS   levothyroxine  75 mcg Oral Q0600   losartan  25 mg Oral QHS   montelukast  10 mg Oral QHS   mouth rinse  15 mL Mouth Rinse 4 times per day   pantoprazole  40 mg Oral Daily   pravastatin  20 mg Oral Daily    Assessment: Pt  with c/o increasing SHOB.  Today his PCP sent him for a CT scan and then called him this afternoon and told him to come to ER because he has a blood clot in his lung.   6/17 :  Heparin level therapeutic x 2  Goal of Therapy:  Heparin level 0.3-0.7 units/ml Monitor platelets by anticoagulation protocol: Yes   Plan:  Cont heparin 1400 units/hr Monitor CBC, PLTS, s/sx bleeding complications  Hart Robinsons, PharmD Clinical Pharmacist

## 2022-01-07 NOTE — ED Provider Notes (Signed)
[facility] Provider Note   CSN: [id number] Arrival date & time: 01/04/22  1710     History  Chief Complaint  Patient presents with   Shortness of Breath    Cristian Yang is a 87 y.o. male.  Patient has history of COPD and has been short of breath.  He had a CT angio done as an outpatient that showed PE  The history is provided by the patient and medical records. No language interpreter was used.  Shortness of Breath Severity:  Moderate Onset quality:  Sudden Progression:  Worsening Chronicity:  New Context: not activity   Relieved by:  Nothing Worsened by:  Nothing Ineffective treatments:  None tried Associated symptoms: no abdominal pain, no chest pain, no cough, no headaches and no rash        Home Medications Prior to Admission medications   Medication Sig Start Date End Date Taking? Authorizing Provider  albuterol (PROVENTIL) (2.5 MG/3ML) 0.083% nebulizer solution USE (1) IN NEBULIZER EVERY 6 HOURS AS NEEDED FOR SHORTNESS OF BREATH OR WHEEZING. Patient taking differently: Take 2.5 mg by nebulization every 6 (six) hours as needed for wheezing or shortness of breath. 08/14/21  Yes Chesley Mires, MD  apixaban (ELIQUIS) 5 MG TABS tablet Take 1 tablet (5 mg total) by mouth 2 (two) times daily. Start this after you complete the initial starter pack 02/01/22  Yes Emokpae, Courage, MD  calcium elemental as carbonate (BARIATRIC TUMS ULTRA) 400 MG chewable tablet Chew 3,000 mg by mouth at bedtime.   Yes [provider]  citalopram (CELEXA) 20 MG tablet Take 1 tablet (20 mg total) by mouth at bedtime. 10/15/18  Yes Orson Eva J, DO  docusate sodium (COLACE) 100 MG capsule TAKE 1 CAPSULE BY MOUTH IN THE MORNING Patient taking differently: Take 100 mg by mouth daily. 01/08/19  Yes Harriet Butte, DO  gabapentin (NEURONTIN) 300 MG capsule TAKE 1 CAPSULE BY MOUTH IN THE MORNING TAKE 2 CAPSULE IN THE EVENING Patient taking differently: Take 300 mg by mouth  3 (three) times daily. TAKE 1 CAPSULE BY MOUTH IN THE MORNING TAKE 2 CAPSULE IN THE EVENING 05/01/18  Yes Harriet Butte, DO  guaiFENesin (MUCINEX) 600 MG 12 hr tablet Take 1 tablet (600 mg total) by mouth 2 (two) times daily. 09/01/19  Yes Roxan Hockey, MD  HYDROcodone-acetaminophen (NORCO/VICODIN) 5-325 MG tablet Take 1 tablet by mouth every 6 (six) hours as needed for moderate pain or severe pain. 08/30/19  Yes Rolland Porter, MD  levothyroxine (SYNTHROID, LEVOTHROID) 75 MCG tablet Take 1 tablet (75 mcg total) by mouth daily before breakfast. 10/15/18  Yes Orson Eva J, DO  lidocaine (XYLOCAINE) 5 % ointment Apply 1 application topically 3 (three) times daily as needed. Apply to affected area Patient taking differently: Apply 1 application  topically 3 (three) times daily as needed for mild pain. Apply to affected area 11/21/17  Yes Nuala Alpha, MD  methocarbamol (ROBAXIN) 750 MG tablet Take 750-1,500 mg by mouth 3 (three) times daily as needed for muscle spasms. 05/30/20  Yes [provider]  mometasone (NASONEX) 50 MCG/ACT nasal spray Place 2 sprays into the nose daily. 08/14/21  Yes Chesley Mires, MD  montelukast (SINGULAIR) 10 MG tablet Take 1 tablet (10 mg total) by mouth at bedtime. 04/06/20  Yes Chesley Mires, MD  multivitamin (ONE-A-DAY MEN'S) TABS tablet Take 1 tablet by mouth every morning. 10/15/18  Yes Bufford Lope, DO  nystatin-triamcinolone (MYCOLOG II) cream Apply 1 Application topically daily  as needed. 01/02/22  Yes [provider]  pravastatin (PRAVACHOL) 20 MG tablet Take 20 mg by mouth daily. 05/11/21  Yes [provider]  amLODipine (NORVASC) 5 MG tablet Take 1 tablet (5 mg total) by mouth daily. 01/05/22   Roxan Hockey, MD  Apixaban Starter Pack, '10mg'$  and '5mg'$ , (ELIQUIS DVT/PE STARTER PACK) Take as directed on package: start with two-'5mg'$  tablets twice daily for 7 days. On day 8, switch to one-'5mg'$  tablet twice daily. 01/05/22   Roxan Hockey, MD   Fluticasone-Umeclidin-Vilant (TRELEGY ELLIPTA) 100-62.5-25 MCG/ACT AEPB Inhale 1 puff into the lungs daily. 01/05/22   Roxan Hockey, MD  losartan (COZAAR) 50 MG tablet Take 0.5 tablets (25 mg total) by mouth at bedtime. 01/07/22   Roxan Hockey, MD  omeprazole (PRILOSEC) 20 MG capsule Take 1 capsule (20 mg total) by mouth every morning. 01/05/22   Roxan Hockey, MD      Allergies    Amoxicillin-pot clavulanate, Ibuprofen, Penicillins, Ciprofloxacin, and Codeine    Review of Systems   Review of Systems  Constitutional:  Negative for appetite change and fatigue.  HENT:  Negative for congestion, ear discharge and sinus pressure.   Eyes:  Negative for discharge.  Respiratory:  Positive for shortness of breath. Negative for cough.   Cardiovascular:  Negative for chest pain.  Gastrointestinal:  Negative for abdominal pain and diarrhea.  Genitourinary:  Negative for frequency and hematuria.  Musculoskeletal:  Negative for back pain.  Skin:  Negative for rash.  Neurological:  Negative for seizures and headaches.  Psychiatric/Behavioral:  Negative for hallucinations.     Physical Exam Updated Vital Signs BP (!) 166/78   Pulse 67   Temp 97.7 F (36.5 C) (Oral)   Resp 19   Ht '5\' 8"'$  (1.727 m)   Wt 96.8 kg   SpO2 97%   BMI 32.45 kg/m  Physical Exam Vitals and nursing note reviewed.  Constitutional:      Appearance: He is well-developed.  HENT:     Head: Normocephalic.     Nose: Nose normal.  Eyes:     General: No scleral icterus.    Conjunctiva/sclera: Conjunctivae normal.  Neck:     Thyroid: No thyromegaly.  Cardiovascular:     Rate and Rhythm: Normal rate and regular rhythm.     Heart sounds: No murmur heard.    No friction rub. No gallop.  Pulmonary:     Breath sounds: No stridor. No wheezing or rales.  Chest:     Chest wall: No tenderness.  Abdominal:     General: There is no distension.     Tenderness: There is no abdominal tenderness. There is no rebound.   Musculoskeletal:        General: Normal range of motion.     Cervical back: Neck supple.  Lymphadenopathy:     Cervical: No cervical adenopathy.  Skin:    Findings: No erythema or rash.  Neurological:     Mental Status: He is alert and oriented to person, place, and time.     Motor: No abnormal muscle tone.     Coordination: Coordination normal.  Psychiatric:        Behavior: Behavior normal.     ED Results / Procedures / Treatments   Labs (all labs ordered are listed, but only abnormal results are displayed) Labs Reviewed  CBC WITH DIFFERENTIAL/PLATELET - Abnormal; Notable for the following components:      Result Value   RBC 3.48 (*)    Hemoglobin 11.2 (*)  HCT 33.4 (*)    Monocytes Absolute 1.6 (*)    Abs Immature Granulocytes 0.09 (*)    All other components within normal limits  COMPREHENSIVE METABOLIC PANEL - Abnormal; Notable for the following components:   Sodium 134 (*)    Chloride 97 (*)    Glucose, Bld 111 (*)    Calcium 8.8 (*)    All other components within normal limits  COMPREHENSIVE METABOLIC PANEL - Abnormal; Notable for the following components:   Glucose, Bld 100 (*)    Calcium 8.6 (*)    Total Protein 6.0 (*)    Albumin 3.1 (*)    All other components within normal limits  CBC - Abnormal; Notable for the following components:   RBC 3.11 (*)    Hemoglobin 10.1 (*)    HCT 29.5 (*)    All other components within normal limits  MRSA NEXT GEN BY PCR, NASAL  APTT  PROTIME-INR  HEPARIN LEVEL (UNFRACTIONATED)  MAGNESIUM  PHOSPHORUS  HEPARIN LEVEL (UNFRACTIONATED)    EKG EKG Interpretation  Date/Time:  Friday January 04 2022 18:17:05 EDT Ventricular Rate:  66 PR Interval:  192 QRS Duration: 156 QT Interval:  439 QTC Calculation: 460 R Axis:   -53 Text 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 Exam Location:  [name] Procedure: 2D Echo, Cardiac Doppler and Color Doppler Indications:    Pulmonary Embolus I26.09  History:        Patient has prior history of Echocardiogram examinations, most                 recent 08/29/2020. COPD; Risk Factors:Dyslipidemia, Hypertension,                 Sleep Apnea and GERD.  Sonographer:    Bernadene Person RDCS Referring Phys: [id number] OLADAPO ADEFESO IMPRESSIONS  1. Left ventricular ejection fraction, by estimation, is 60 to 65%. The left ventricle has normal function. The left ventricle has no regional wall motion abnormalities. Left ventricular diastolic parameters are consistent with Grade I diastolic dysfunction (impaired relaxation).  2. Right ventricular systolic function is hyperdynamic. The right ventricular size is normal. There is normal pulmonary artery systolic pressure. The estimated right ventricular systolic pressure is 84.5 mmHg.  3. Left atrial size was moderately dilated.  4. Right atrial size was moderately dilated.  5. The mitral valve is grossly normal. No evidence of mitral valve regurgitation.  6. The aortic valve is tricuspid. Aortic valve regurgitation is not visualized. Mild aortic valve stenosis. Aortic valve area, by VTI measures 1.74 cm. Aortic valve mean gradient measures 13.0 mmHg. Aortic valve  Vmax measures 2.30 m/s. DI is 0.50.  7. Aortic dilatation noted. There is borderline dilatation of the aortic root, measuring 39 mm. Comparison(s): Changes from prior study are noted. 08/29/2020: LVEF 60-65%, mild AS. FINDINGS  Left Ventricle: Left ventricular ejection fraction, by estimation, is  60 to 65%. The left ventricle has normal function. The left ventricle has no regional wall motion abnormalities. The left ventricular internal cavity size was normal in size. There is  no left ventricular hypertrophy. Left ventricular diastolic parameters are consistent with Grade I diastolic dysfunction (impaired relaxation). Indeterminate filling pressures. Right Ventricle: The right ventricular size is normal. No increase in right ventricular wall thickness. Right ventricular systolic function is hyperdynamic. There is normal pulmonary artery systolic pressure. The tricuspid regurgitant velocity is 2.35 m/s, and with an assumed right atrial pressure of 8 mmHg, the estimated right ventricular systolic pressure is 16.1 mmHg. Left Atrium: Left atrial size was moderately dilated. Right Atrium: Right atrial size was moderately dilated. Pericardium: There is no evidence of pericardial effusion. Mitral Valve: The mitral valve is grossly normal. No evidence of mitral valve regurgitation. Tricuspid Valve: The tricuspid valve is grossly normal. Tricuspid valve regurgitation is trivial. Aortic Valve: The aortic valve is tricuspid. Aortic valve regurgitation is not visualized. Mild aortic stenosis is present. Aortic valve mean gradient measures 13.0 mmHg. Aortic valve peak gradient measures 21.2 mmHg. Aortic valve area, by VTI measures 1.74 cm. Pulmonic Valve: The pulmonic valve was normal in structure. Pulmonic valve regurgitation is not visualized. Aorta: Aortic dilatation noted. There is borderline dilatation of the aortic root, measuring 39 mm. IAS/Shunts: No atrial level shunt detected by color flow Doppler.  LEFT VENTRICLE PLAX 2D LVIDd:         5.50 cm      Diastology LVIDs:         3.90 cm      LV e' medial:    5.27 cm/s LV PW:         1.00 cm      LV E/e' medial:  19.7 LV IVS:        1.00 cm      LV e' lateral:   9.24 cm/s LVOT diam:     2.10 cm      LV E/e' lateral: 11.3 LV SV:         94 LV SV Index:   45 LVOT  Area:     3.46 cm  LV Volumes (MOD) LV vol d, MOD A2C: 120.0 ml LV vol d, MOD A4C: 117.0 ml LV vol s, MOD A2C: 58.1 ml LV vol s, MOD A4C: 47.0 ml LV SV MOD A2C:     61.9 ml LV SV MOD A4C:     117.0 ml LV SV MOD BP:      64.5 ml RIGHT VENTRICLE RV S prime:     9.81 cm/s TAPSE (M-mode): 2.3 cm LEFT ATRIUM              Index        RIGHT ATRIUM           Index LA diam:        5.00 cm  2.38 cm/m   RA Area:     25.90 cm LA Vol (A2C):   109.0 ml 51.88 ml/m  RA Volume:   85.20 ml  40.55 ml/m LA Vol (A4C):   87.0 ml  41.41 ml/m LA Biplane Vol: 99.1 ml  47.17 ml/m  AORTIC VALVE AV Area (Vmax):  1.85 cm AV Area (Vmean):   1.65 cm AV Area (VTI):     1.74 cm AV Vmax:           230.00 cm/s AV Vmean:          171.000 cm/s AV VTI:            0.538 m AV Peak Grad:      21.2 mmHg AV Mean Grad:      13.0 mmHg LVOT Vmax:         123.00 cm/s LVOT Vmean:        81.500 cm/s LVOT VTI:          0.271 m LVOT/AV VTI ratio: 0.50  AORTA Ao Root diam: 3.90 cm Ao Asc diam:  3.40 cm MITRAL VALVE                TRICUSPID VALVE MV Area (PHT): 3.99 cm     TR Peak grad:   22.1 mmHg MV Decel Time: 190 msec     TR Vmax:        235.00 cm/s MV E velocity: 104.00 cm/s MV A velocity: 105.00 cm/s  SHUNTS MV E/A ratio:  0.99         Systemic VTI:  0.27 m                             Systemic Diam: 2.10 cm Lyman Bishop MD Electronically signed by Lyman Bishop MD Signature Date/Time: 01/05/2022/3:42:02 PM    Final     Procedures Procedures    Medications Ordered in ED Medications  heparin bolus via infusion 5,000 Units (5,000 Units Intravenous Bolus from Bag 01/04/22 2012)    ED Course/ Medical Decision Making/ A&P  CRITICAL CARE Performed by: Milton Ferguson Total critical care time: 45 minutes Critical care time was exclusive of separately billable procedures and treating other patients. Critical care was necessary to treat or prevent imminent or life-threatening deterioration. Critical care was time spent personally by me on the  following activities: development of treatment plan with patient and/or surrogate as well as nursing, discussions with consultants, evaluation of patient's response to treatment, examination of patient, obtaining history from patient or surrogate, ordering and performing treatments and interventions, ordering and review of laboratory studies, ordering and review of radiographic studies, pulse oximetry and re-evaluation of patient's condition.                          Medical Decision Making Amount and/or Complexity of Data Reviewed Labs: ordered. Radiology: ordered.  Risk Decision regarding hospitalization.  This patient presents to the ED for concern of shortness of breath, this involves an extensive number of treatment options, and is a complaint that carries with it a high risk of complications and morbidity.  The differential diagnosis includes PE, pneumonia   Co morbidities that complicate the patient evaluation  COPD   Additional history obtained:  Additional history obtained from outpatient provider External records from outside source obtained and reviewed including hospital record   Lab Tests:  I Ordered, and personally interpreted labs.  The pertinent results include: CBC shows anemia 11.2   Imaging Studies ordered:  I ordered imaging studies including chest x-ray I independently visualized and interpreted imaging which showed patchy airspace disease I agree with the radiologist interpretation   Cardiac Monitoring: / EKG:  The patient was maintained on a cardiac monitor.  I personally viewed  and interpreted the cardiac monitored which showed an underlying rhythm of: Normal sinus rhythm   Consultations Obtained:  I requested consultation with the hospital,  and discussed lab and imaging findings as well as pertinent plan - they recommend: Admit for PE   Problem List / ED Course / Critical interventions / Medication management  COPD and PE I ordered  medication including heparin for PE Reevaluation of the patient after these medicines showed that the patient stayed the same I have reviewed the patients home medicines and have made adjustments as needed   Social Determinants of Health:  None   Test / Admission - Considered:  No additional test necessary  Patient with a small PE.  He will be placed on heparin and admitted to medicine        Final Clinical Impression(s) / ED Diagnoses Final diagnoses:  Dyspnea, unspecified type    Rx / DC Orders ED Discharge Orders          Ordered    apixaban (ELIQUIS) 5 MG TABS tablet  2 times daily        01/05/22 1551    losartan (COZAAR) 50 MG tablet  Daily at bedtime        01/05/22 1551    omeprazole (PRILOSEC) 20 MG capsule  Every morning        01/05/22 1551    Fluticasone-Umeclidin-Vilant (TRELEGY ELLIPTA) 100-62.5-25 MCG/ACT AEPB  Daily        01/05/22 1551    amLODipine (NORVASC) 5 MG tablet  Daily        01/05/22 1551    Apixaban Starter Pack, '10mg'$  and '5mg'$ , (ELIQUIS DVT/PE STARTER PACK)  Status:  Discontinued        01/05/22 1551    Increase activity slowly        01/05/22 1551    Diet - low sodium heart healthy        01/05/22 1551    Discharge instructions       Comments: 1)Avoid ibuprofen/Advil/Aleve/Motrin/Goody Powders/Naproxen/BC powders/Meloxicam/Diclofenac/Indomethacin and other Nonsteroidal anti-inflammatory medications as these will make you more likely to bleed and can cause stomach ulcers, can also cause Kidney problems.   2) you are taking Eliquis/apixaban which is a blood thinner so be careful not to hurt yourself as you might bleed more -Please call if any concerns about bleeding including blood in your stool which may cause your stool to be dark or red and blood in your urine or nosebleeds  3)you need oxygen at home at 2 L via nasal cannula continuously while awake and while asleep--- smoking or having open fires around oxygen can cause fire,  significant injury and death  4)follow up with Pablo Lawrence, NP in 1 week for recheck and reevaluation   01/05/22 1551    Call MD for:  temperature >100.4        01/05/22 1551    Call MD for:  persistant nausea and vomiting        01/05/22 1551    Call MD for:  difficulty breathing, headache or visual disturbances        01/05/22 1551    Call MD for:  persistant dizziness or light-headedness        01/05/22 1551    Call MD for:  severe uncontrolled pain        01/05/22 1551    Apixaban Starter Pack, '10mg'$  and '5mg'$ , (ELIQUIS DVT/PE STARTER PACK)        01/05/22 1552  Milton Ferguson, MD 01/07/22 1340

## 2022-01-09 DIAGNOSIS — I10 Essential (primary) hypertension: Secondary | ICD-10-CM | POA: Diagnosis not present

## 2022-01-09 DIAGNOSIS — J9621 Acute and chronic respiratory failure with hypoxia: Secondary | ICD-10-CM | POA: Diagnosis not present

## 2022-01-09 DIAGNOSIS — D649 Anemia, unspecified: Secondary | ICD-10-CM | POA: Diagnosis not present

## 2022-01-09 DIAGNOSIS — E039 Hypothyroidism, unspecified: Secondary | ICD-10-CM | POA: Diagnosis not present

## 2022-01-09 DIAGNOSIS — I2699 Other pulmonary embolism without acute cor pulmonale: Secondary | ICD-10-CM | POA: Diagnosis not present

## 2022-01-16 ENCOUNTER — Ambulatory Visit (HOSPITAL_COMMUNITY)
Admission: RE | Admit: 2022-01-16 | Discharge: 2022-01-16 | Disposition: A | Discharge status: Home / Self Care | Payer: Medicare Other | Source: Ambulatory Visit | Attending: Adult Health Nurse Practitioner | Admitting: Adult Health Nurse Practitioner

## 2022-01-16 DIAGNOSIS — J189 Pneumonia, unspecified organism: Secondary | ICD-10-CM | POA: Insufficient documentation

## 2022-01-16 DIAGNOSIS — M545 Low back pain, unspecified: Secondary | ICD-10-CM | POA: Insufficient documentation

## 2022-01-16 DIAGNOSIS — M4126 Other idiopathic scoliosis, lumbar region: Secondary | ICD-10-CM | POA: Diagnosis not present

## 2022-01-29 ENCOUNTER — Ambulatory Visit (INDEPENDENT_AMBULATORY_CARE_PROVIDER_SITE_OTHER): Payer: Medicare Other | Admitting: Pulmonary Disease

## 2022-01-29 ENCOUNTER — Encounter: Payer: Self-pay | Admitting: Pulmonary Disease

## 2022-01-29 VITALS — BP 134/68 | HR 88 | Temp 98.7°F | Ht 68.0 in | Wt 215.8 lb

## 2022-01-29 DIAGNOSIS — J449 Chronic obstructive pulmonary disease, unspecified: Secondary | ICD-10-CM

## 2022-01-29 DIAGNOSIS — G4733 Obstructive sleep apnea (adult) (pediatric): Secondary | ICD-10-CM | POA: Diagnosis not present

## 2022-01-29 DIAGNOSIS — R058 Other specified cough: Secondary | ICD-10-CM

## 2022-01-29 DIAGNOSIS — J439 Emphysema, unspecified: Secondary | ICD-10-CM

## 2022-01-29 NOTE — Progress Notes (Signed)
[facility]  Chief Complaint  Patient presents with   Follow-up    Had a blood clot in lung last month, feeling better.     Constitutional:  BP 134/68 (BP Location: Left Arm, Patient Position: Sitting)   Pulse 88   Temp 98.7 F (37.1 C) (Temporal)   Ht '5\' 8"'$  (1.727 m)   Wt 215 lb 12.8 oz (97.9 kg)   SpO2 93% Comment: ra  BMI 32.81 kg/m   Past Medical History:  Chronic back pain, Depression, GERD, HLD, HTN, Hypothyroidism, Hiatal hernia  Past Surgical History:  His  has a past surgical history that includes Skin cancer excision (2013); Prostate surgery; Neck surgery; skin cancer removed; Cataract extraction w/PHACO (Left, 02/13/2016); and Cataract extraction w/PHACO (Right, 05/28/2019).  Brief Summary:  Cristian Yang is a 87 y.o. 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      Subjective:   He was in hospital with pneumonia.  He was still feeling low after discharge.  He saw his PCP and had CT chest.  This showed PE.  He was started on eliquis.  He has been feeling better more recently.  Not needing oxygen during the day.  Still using oxygen at night with CPAP.  Not having cough, wheeze, or chest congestion.  Appetite has improved.     Physical Exam:   Appearance - well kempt   ENMT - no sinus tenderness, no oral exudate, no LAN, Mallampati 3 airway, no stridor, poor hearing  Respiratory - equal breath sounds bilaterally, no wheezing or rales  CV - s1s2 regular rate and rhythm, no murmurs  Ext - no clubbing, no edema  Skin - no rashes  Psych - normal mood and affect      Pulmonary testing:  PFT 12/12/14 >> FEV1 1.61 (60%), FEV1% 66, TLC 5.48 (82%), DLCO 47%, no BD  Chest Imaging:  CT angio chest 08/30/19 >> hiatal hernia, patchy and tree in bud nodularity in LUL, b/l lower lobe consolidation CT angio chest 07/13/21 >> atherosclerosis, small hiatal hernia CT angio chest 01/04/22 >>  RLL PE, upper lobe emphysema, areas of tree in bud  Sleep Tests:  PSG 09/29/02 >> AHI 12, SaO2 low 85%  Cardiac Tests:  Echo 01/05/22 >> EF 60 to 65%, grade 1 DD, RVSP 30.1 mmHg, mod LA/RA dilation, mild AS, aortic root 39 mm  Social History:  He  reports that he quit smoking about 18 years ago. His smoking use included cigarettes. He has a 60.00 pack-year smoking history. He has never used smokeless tobacco. He reports that he does not drink alcohol and does not use drugs.  Family History:  His family history includes Asthma in his father; Bone cancer in his sister; Diabetes in his mother; Heart disease in his mother; Liver cancer in his brother.     Assessment/Plan:   COPD with emphysema and asthma. - continue trelegy, singulair - prn albuterol   Obstructive sleep apnea. - he is compliant with CPAP - he uses [facility] for his DME - continue CPAP and oxygen at 2 liters - will get a copy of his CPAP download and call him with results   Upper airway cough with post-nasal drip. - continue nasonex, singulair  - prn zytrec  Pulmonary embolism. - from June 2023 - eliquis per PCP  Time Spent Involved in Patient Care on Day of Examination:  36 minutes  Follow up:   Patient Instructions  Follow up in 6 months  Medication List:   Allergies as of 01/29/2022       Reactions   Amoxicillin-pot Clavulanate Itching   Ibuprofen Hives   Bilateral arm hives after taking ibuprofen   Penicillins Rash   Ciprofloxacin Nausea And Vomiting   Codeine Nausea And Vomiting        Medication List        Accurate as of January 29, 2022 10:32 AM. If you have any questions, ask your nurse or doctor.          albuterol (2.5 MG/3ML) 0.083% nebulizer solution Commonly known as: PROVENTIL USE (1) IN NEBULIZER EVERY 6 HOURS AS NEEDED FOR SHORTNESS OF BREATH OR WHEEZING. What changed:  how much to take how to take this when to take this reasons to take this additional instructions    amLODipine 5 MG tablet Commonly known as: NORVASC Take 1 tablet (5 mg total) by mouth daily.   Eliquis DVT/PE Starter Pack Generic drug: Apixaban Starter Pack ('10mg'$  and '5mg'$ ) Take as directed on package: start with two-'5mg'$  tablets twice daily for 7 days. On day 8, switch to one-'5mg'$  tablet twice daily.   apixaban 5 MG Tabs tablet Commonly known as: ELIQUIS Take 1 tablet (5 mg total) by mouth 2 (two) times daily. Start this after you complete the initial starter pack Start taking on: February 01, 2022   calcium elemental as carbonate 400 MG chewable tablet Commonly known as: BARIATRIC TUMS ULTRA Chew 3,000 mg by mouth at bedtime.   citalopram 20 MG tablet Commonly known as: CELEXA Take 1 tablet (20 mg total) by mouth at bedtime.   docusate sodium 100 MG capsule Commonly known as: COLACE TAKE 1 CAPSULE BY MOUTH IN THE MORNING What changed: when to take this   gabapentin 300 MG capsule Commonly known as: NEURONTIN TAKE 1 CAPSULE BY MOUTH IN THE MORNING TAKE 2 CAPSULE IN THE EVENING What changed:  how much to take how to take this when to take this   guaiFENesin 600 MG 12 hr tablet Commonly known as: MUCINEX Take 1 tablet (600 mg total) by mouth 2 (two) times daily.   HYDROcodone-acetaminophen 5-325 MG tablet Commonly known as: NORCO/VICODIN Take 1 tablet by mouth every 6 (six) hours as needed for moderate pain or severe pain.   levothyroxine 75 MCG tablet Commonly known as: SYNTHROID Take 1 tablet (75 mcg total) by mouth daily before breakfast.   lidocaine 5 % ointment Commonly known as: XYLOCAINE Apply 1 application topically 3 (three) times daily as needed. Apply to affected area What changed: reasons to take this   losartan 50 MG tablet Commonly known as: COZAAR Take 0.5 tablets (25 mg total) by mouth at bedtime.   methocarbamol 750 MG tablet Commonly known as: ROBAXIN Take 750-1,500 mg by mouth 3 (three) times daily as needed for muscle spasms.   mometasone 50  MCG/ACT nasal spray Commonly known as: NASONEX Place 2 sprays into the nose daily.   montelukast 10 MG tablet Commonly known as: SINGULAIR Take 1 tablet (10 mg total) by mouth at bedtime.   multivitamin Tabs tablet Take 1 tablet by mouth every morning.   nystatin-triamcinolone cream Commonly known as: MYCOLOG II Apply 1 Application topically daily as needed.   omeprazole 20 MG capsule Commonly known as: PRILOSEC Take 1 capsule (20 mg total) by mouth every morning.   pravastatin 20 MG tablet Commonly known as: PRAVACHOL Take 20 mg by mouth daily.   Trelegy Ellipta 100-62.5-25 MCG/ACT  Aepb Generic drug: Fluticasone-Umeclidin-Vilant Inhale 1 puff into the lungs daily.        Signature:  Chesley Mires, MD [place] Pager - [phone] 01/29/2022, 10:32 AM

## 2022-01-29 NOTE — Patient Instructions (Signed)
Follow up in 6 months 

## 2022-03-24 ENCOUNTER — Other Ambulatory Visit: Payer: Self-pay

## 2022-03-24 ENCOUNTER — Emergency Department (HOSPITAL_COMMUNITY): Payer: Medicare Other

## 2022-03-24 ENCOUNTER — Emergency Department (HOSPITAL_COMMUNITY)
Admission: EM | Admit: 2022-03-24 | Discharge: 2022-03-24 | Disposition: A | Discharge status: Home / Self Care | Payer: Medicare Other | Attending: Emergency Medicine | Admitting: Emergency Medicine

## 2022-03-24 ENCOUNTER — Encounter (HOSPITAL_COMMUNITY): Payer: Self-pay | Admitting: *Deleted

## 2022-03-24 DIAGNOSIS — Z7901 Long term (current) use of anticoagulants: Secondary | ICD-10-CM | POA: Insufficient documentation

## 2022-03-24 DIAGNOSIS — J45909 Unspecified asthma, uncomplicated: Secondary | ICD-10-CM | POA: Diagnosis not present

## 2022-03-24 DIAGNOSIS — Z7951 Long term (current) use of inhaled steroids: Secondary | ICD-10-CM | POA: Insufficient documentation

## 2022-03-24 DIAGNOSIS — I10 Essential (primary) hypertension: Secondary | ICD-10-CM | POA: Insufficient documentation

## 2022-03-24 DIAGNOSIS — M25471 Effusion, right ankle: Secondary | ICD-10-CM | POA: Diagnosis not present

## 2022-03-24 DIAGNOSIS — M25571 Pain in right ankle and joints of right foot: Secondary | ICD-10-CM | POA: Diagnosis not present

## 2022-03-24 DIAGNOSIS — M19071 Primary osteoarthritis, right ankle and foot: Secondary | ICD-10-CM | POA: Diagnosis not present

## 2022-03-24 DIAGNOSIS — J449 Chronic obstructive pulmonary disease, unspecified: Secondary | ICD-10-CM | POA: Diagnosis not present

## 2022-03-24 DIAGNOSIS — M7989 Other specified soft tissue disorders: Secondary | ICD-10-CM | POA: Diagnosis not present

## 2022-03-24 DIAGNOSIS — R609 Edema, unspecified: Secondary | ICD-10-CM | POA: Diagnosis not present

## 2022-03-24 DIAGNOSIS — Z79899 Other long term (current) drug therapy: Secondary | ICD-10-CM | POA: Insufficient documentation

## 2022-03-24 DIAGNOSIS — E039 Hypothyroidism, unspecified: Secondary | ICD-10-CM | POA: Insufficient documentation

## 2022-03-24 DIAGNOSIS — M79671 Pain in right foot: Secondary | ICD-10-CM | POA: Insufficient documentation

## 2022-03-24 NOTE — Discharge Instructions (Signed)
Elevate your right foot when possible.  Keep the Ace wrap during the day remove it for bathing and for bedtime.  You may apply Voltaren gel.  You can buy this over-the-counter without a prescription.  Use as directed to your ankle.  Continue to use your walker.  Follow-up with your primary care provider or with the orthopedic provider listed in 1 week if not improving.

## 2022-03-24 NOTE — ED Triage Notes (Addendum)
Pt with right foot swelling, started yesterday evening.  Not able to put weight on foot.  Denies any injury to foot. Pt believes he "aggravated" his foot with walking Friday.

## 2022-03-24 NOTE — ED Provider Notes (Signed)
[facility] EMERGENCY DEPARTMENT Provider Note   CSN: [id number] Arrival date & time: 03/24/22  1125     History  Chief Complaint  Patient presents with   Foot Swelling    Cristian Yang is a 87 y.o. male.  HPI      Cristian Yang is a 87 y.o. male with past medical history of COPD, hypothyroidism, asthma, hypertension who presents to the Emergency Department complaining of right foot pain and swelling.  Symptoms began yesterday evening.  States he woke this morning with pain of his right foot that is worsened with attempted weightbearing.  Believes that he "aggravated" his foot 2 days ago while walking.  Has history of arthritis of his foot and states that it swelling like this if he does a lot of walking or standing. Denies fall or other injury.  Denies numbness or tingling of his foot or toes.  Pain and swelling do not radiate into his lower leg.   Home Medications Prior to Admission medications   Medication Sig Start Date End Date Taking? Authorizing Provider  albuterol (PROVENTIL) (2.5 MG/3ML) 0.083% nebulizer solution USE (1) IN NEBULIZER EVERY 6 HOURS AS NEEDED FOR SHORTNESS OF BREATH OR WHEEZING. Patient taking differently: Take 2.5 mg by nebulization every 6 (six) hours as needed for wheezing or shortness of breath. 08/14/21   Chesley Mires, MD  amLODipine (NORVASC) 5 MG tablet Take 1 tablet (5 mg total) by mouth daily. 01/05/22   Roxan Hockey, MD  apixaban (ELIQUIS) 5 MG TABS tablet Take 1 tablet (5 mg total) by mouth 2 (two) times daily. Start this after you complete the initial starter pack 02/01/22   Roxan Hockey, MD  Apixaban Starter Pack, '10mg'$  and '5mg'$ , (ELIQUIS DVT/PE STARTER PACK) Take as directed on package: start with two-'5mg'$  tablets twice daily for 7 days. On day 8, switch to one-'5mg'$  tablet twice daily. 01/05/22   Roxan Hockey, MD  calcium elemental as carbonate (BARIATRIC TUMS ULTRA) 400 MG chewable tablet Chew 3,000 mg by mouth at bedtime.    [provider]  citalopram (CELEXA) 20 MG tablet Take 1 tablet (20 mg total) by mouth at bedtime. 10/15/18   Bufford Lope, DO  docusate sodium (COLACE) 100 MG capsule TAKE 1 CAPSULE BY MOUTH IN THE MORNING Patient taking differently: Take 100 mg by mouth daily. 01/08/19   Harriet Butte, DO  Fluticasone-Umeclidin-Vilant (TRELEGY ELLIPTA) 100-62.5-25 MCG/ACT AEPB Inhale 1 puff into the lungs daily. 01/05/22   Roxan Hockey, MD  gabapentin (NEURONTIN) 300 MG capsule TAKE 1 CAPSULE BY MOUTH IN THE MORNING TAKE 2 CAPSULE IN THE EVENING Patient taking differently: Take 300 mg by mouth 3 (three) times daily. TAKE 1 CAPSULE BY MOUTH IN THE MORNING TAKE 2 CAPSULE IN THE EVENING 05/01/18   Harriet Butte, DO  guaiFENesin (MUCINEX) 600 MG 12 hr tablet Take 1 tablet (600 mg total) by mouth 2 (two) times daily. 09/01/19   Roxan Hockey, MD  HYDROcodone-acetaminophen (NORCO/VICODIN) 5-325 MG tablet Take 1 tablet by mouth every 6 (six) hours as needed for moderate pain or severe pain. 08/30/19   Rolland Porter, MD  levothyroxine (SYNTHROID, LEVOTHROID) 75 MCG tablet Take 1 tablet (75 mcg total) by mouth daily before breakfast. 10/15/18   Bufford Lope, DO  lidocaine (XYLOCAINE) 5 % ointment Apply 1 application topically 3 (three) times daily as needed. Apply to affected area Patient taking differently: Apply 1 application  topically 3 (three) times daily as needed for mild pain. Apply to affected  area 11/21/17   Nuala Alpha, MD  losartan (COZAAR) 50 MG tablet Take 0.5 tablets (25 mg total) by mouth at bedtime. 01/07/22   Roxan Hockey, MD  methocarbamol (ROBAXIN) 750 MG tablet Take 750-1,500 mg by mouth 3 (three) times daily as needed for muscle spasms. 05/30/20   [provider]  mometasone (NASONEX) 50 MCG/ACT nasal spray Place 2 sprays into the nose daily. 08/14/21   Chesley Mires, MD  montelukast (SINGULAIR) 10 MG tablet Take 1 tablet (10 mg total) by mouth at bedtime. 04/06/20   Chesley Mires, MD   multivitamin (ONE-A-DAY MEN'S) TABS tablet Take 1 tablet by mouth every morning. 10/15/18   Bufford Lope, DO  nystatin-triamcinolone (MYCOLOG II) cream Apply 1 Application topically daily as needed. 01/02/22   [provider]  omeprazole (PRILOSEC) 20 MG capsule Take 1 capsule (20 mg total) by mouth every morning. 01/05/22   Roxan Hockey, MD  pravastatin (PRAVACHOL) 20 MG tablet Take 20 mg by mouth daily. 05/11/21   [provider]      Allergies    Amoxicillin-pot clavulanate, Ibuprofen, Penicillins, Ciprofloxacin, and Codeine    Review of Systems   Review of Systems  Constitutional:  Negative for chills and fever.  Respiratory:  Negative for shortness of breath.   Cardiovascular:  Negative for chest pain and leg swelling.  Genitourinary:  Negative for difficulty urinating.  Musculoskeletal:  Positive for arthralgias (right foot pain). Negative for back pain.  Skin:  Negative for color change, rash and wound.  Neurological:  Negative for dizziness, weakness and numbness.  Psychiatric/Behavioral:  Negative for confusion.     Physical Exam Updated Vital Signs BP 122/74 (BP Location: Left Arm)   Pulse 73   Temp 97.8 F (36.6 C) (Temporal)   Resp 16   Ht '5\' 8"'$  (1.727 m)   Wt 98 kg   SpO2 94%   BMI 32.84 kg/m  Physical Exam Vitals and nursing note reviewed.  Constitutional:      General: He is not in acute distress.    Appearance: Normal appearance. He is not ill-appearing or toxic-appearing.  Cardiovascular:     Rate and Rhythm: Normal rate and regular rhythm.     Pulses: Normal pulses.  Pulmonary:     Effort: Pulmonary effort is normal.     Breath sounds: Normal breath sounds.  Musculoskeletal:        General: Swelling and tenderness present. No deformity.     Right lower leg: No edema.     Left lower leg: No edema.     Comments: Diffuse ttp of the medial and lateral aspects of the right ankle.  No excessive warmth, erythema or wounds.  Right lower  leg non tender, no edema or erythema.  No edema of the dorsal foot  Skin:    General: Skin is warm.     Capillary Refill: Capillary refill takes less than 2 seconds.     Findings: No erythema or rash.  Neurological:     General: No focal deficit present.     Mental Status: He is alert.     Motor: No weakness.     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 There is no definite acute fracture. Alignment is normal. There is mild first MTP and moderate moderate phalangeal joint osteoarthritis. There is moderate midfoot osteoarthritis worst at the fourth and fifth MTP joints. There is severe soft tissue swelling of the ankle and a tibiotalar joint effusion. There is edema in Kager's fat pad. There is mild-to-moderate tibiotalar osteoarthritis. There is plantar and dorsal calcaneal spurring. Dorsal midfoot spurring. Moderate talonavicular osteoarthritis. Right foot: There is no definite acute fracture. Alignment is normal. There is mild first MTP and mild-to-moderate views of phalangeal joint osteoarthritis. There is moderate tarsometatarsal joint osteoarthritis worst at the fourth and fifth TMT joints. There are subchondral lucencies of the midfoot joints, most prominent of the talar navicular. There is moderate talonavicular and mild calcaneocuboid osteoarthritis. No periostitis. IMPRESSION: Nonspecific severe soft tissue swelling of the ankle and tibiotalar joint effusion. 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 No definite acute fracture in the right ankle or  foot. Mild-to-moderate osteoarthritis of the midfoot and hindfoot. Mild first MTP and diffuse interphalangeal joint osteoarthritis. Electronically Signed   By: Maurine Simmering M.D.   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 There is no definite acute fracture. Alignment is normal. There is mild first MTP and moderate moderate phalangeal joint osteoarthritis. There is moderate midfoot osteoarthritis worst at the fourth and fifth MTP joints. There is severe soft tissue swelling of the ankle and a tibiotalar joint effusion. There is edema in Kager's fat pad. There is mild-to-moderate tibiotalar osteoarthritis. There is plantar and dorsal calcaneal spurring. Dorsal midfoot spurring. Moderate talonavicular osteoarthritis. Right foot: There is no definite acute fracture. Alignment is normal. There is mild first MTP and mild-to-moderate views of phalangeal joint osteoarthritis. There is moderate tarsometatarsal joint osteoarthritis worst at the fourth and fifth TMT joints. There are subchondral lucencies of the midfoot joints, most prominent of the talar navicular. There is moderate talonavicular and mild calcaneocuboid osteoarthritis. No periostitis. IMPRESSION: Nonspecific severe soft tissue swelling of the ankle and tibiotalar joint effusion. 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 No definite acute fracture in the right ankle or foot. Mild-to-moderate osteoarthritis of the midfoot and hindfoot. Mild first MTP and diffuse interphalangeal joint osteoarthritis. Electronically Signed   By: Maurine Simmering M.D.   On: 03/24/2022 12:31    Procedures Procedures    Medications Ordered in ED Medications - No data to display  ED  Course/ Medical Decision Making/ A&P                           Medical Decision Making Patient here with pain and swelling localized to the medial and lateral right ankle.  Symptoms began after excessive walking 2 days ago.  No known injury.  States this is a recurring problem for him due to arthritis of his ankle.  He denies any pain to his dorsal foot or pain or swelling of his right leg.  No numbness or tingling.  On exam, patient has localized swelling of the medial and lateral ankle.  No excessive warmth or erythema.  Neurovascular intact.  Pain with dorsiflexion and  plantarflexion of the foot.  Negative Bevelyn Buckles' sign differential diagnosis would include but not limited to exacerbation of OA, musculoskeletal injury, fracture, DVT, septic joint.  Clinically, I suspect this is related to his arthritis.  No history of gout.  Will obtain imaging for further evaluation.  Amount and/or Complexity of Data Reviewed Radiology: ordered.    Details: X-ray of the right ankle shows severe soft tissue swelling at the ankle and tibiotalar joint.  There is a chronic posttraumatic deformity of the distal fibula no acute fracture.  Mild to moderate OA of the mid and hindfoot. Discussion of management or test interpretation with external provider(s): Discussed x-ray findings with the patient, he is agreeable to symptomatic treatment, compression with Ace wrap, applied here by nursing staff.  I have recommended over-the-counter Voltaren gel as directed he has a walker at home.  Appears appropriate for discharge home           Final Clinical Impression(s) / ED Diagnoses Final diagnoses:  Acute right ankle pain    Rx / DC Orders ED Discharge Orders     None         Kem Parkinson, PA-C 03/24/22 1303    Godfrey Pick, MD 03/26/22 [phone]

## 2022-03-27 ENCOUNTER — Telehealth: Payer: Self-pay | Admitting: Orthopedic Surgery

## 2022-03-27 DIAGNOSIS — M25571 Pain in right ankle and joints of right foot: Secondary | ICD-10-CM | POA: Diagnosis not present

## 2022-03-27 DIAGNOSIS — I10 Essential (primary) hypertension: Secondary | ICD-10-CM | POA: Diagnosis not present

## 2022-03-27 NOTE — Telephone Encounter (Signed)
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 No definite acute fracture in the right ankle

## 2022-03-28 ENCOUNTER — Ambulatory Visit (INDEPENDENT_AMBULATORY_CARE_PROVIDER_SITE_OTHER): Payer: Medicare Other | Admitting: Orthopedic Surgery

## 2022-03-28 ENCOUNTER — Encounter: Payer: Self-pay | Admitting: Orthopedic Surgery

## 2022-03-28 DIAGNOSIS — M19079 Primary osteoarthritis, unspecified ankle and foot: Secondary | ICD-10-CM

## 2022-03-28 DIAGNOSIS — M2141 Flat foot [pes planus] (acquired), right foot: Secondary | ICD-10-CM

## 2022-03-28 DIAGNOSIS — M19072 Primary osteoarthritis, left ankle and foot: Secondary | ICD-10-CM

## 2022-03-28 NOTE — Progress Notes (Signed)
Chief Complaint  Patient presents with   Foot Pain    Pain right foot and ankle ER follow-up    HPI: 87 year old male fractured his right fibula many years ago approximately 2019 healed normally  But patient is noted progressively worsening pain and swelling intermittently in his right ankle with pronated foot deformity and flatfoot  Patient went to ER after he had done a lot of walking and shopping and noticed his right foot and ankle were swelling and had more pain  Radiographs were taken of the foot and ankle no obvious fracture but arthritis of the ankle noted  Past Medical History:  Diagnosis Date   Arthritis    Asthma    Chronic back pain    COPD (chronic obstructive pulmonary disease) (HCC)    Depression    GERD (gastroesophageal reflux disease)    Hyperlipidemia    Hypertension    Hypothyroidism    OSA on CPAP    Skin cancer    Sleep apnea    uses CPAP nightly    There were no vitals taken for this visit.   General appearance: Well-developed well-nourished no gross deformities  Cardiovascular normal pulse and perfusion normal color without edema  Neurologically no sensation loss or deficits or pathologic reflexes  Psychological: Awake alert and oriented x3 mood and affect normal  Skin no lacerations or ulcerations no nodularity no palpable masses, no erythema or nodularity  Musculoskeletal: Patient ambulatory with a walker he is in a cam walker  Out of the cam walker we see a flexible flatfoot deformity with tenderness over the posterior medial malleolus posterior tibial tendon and inferior to the fibula.  There is swelling on the medial lateral side of the ankle joint his ankle range of motion is pain-free  Imaging I read 2 outside images from the hospital x2 and reviewed their notes  He has a arthritis in the ankle he has a flatfoot deformity has no fracture of the foot itself  A/P  Encounter Diagnoses  Name Primary?   Pes planus of right foot Yes    Arthritis of ankle right      Recommend he continue with his cam walker at this time.  We will see him in 3 weeks.  When his pain gets 2-10 or less he can take the boot off  He will then need double upright orthosis with probable AFO to control his flexible flatfoot and arthritis of the joint

## 2022-03-28 NOTE — Patient Instructions (Signed)
Wear boot until pain improves to 2/10

## 2022-03-28 NOTE — Telephone Encounter (Signed)
Patient seen for appointment as scheduled today, 03/28/22.

## 2022-04-18 ENCOUNTER — Encounter: Payer: Self-pay | Admitting: Orthopedic Surgery

## 2022-04-18 ENCOUNTER — Ambulatory Visit (INDEPENDENT_AMBULATORY_CARE_PROVIDER_SITE_OTHER): Payer: Medicare Other | Admitting: Orthopedic Surgery

## 2022-04-18 DIAGNOSIS — M19071 Primary osteoarthritis, right ankle and foot: Secondary | ICD-10-CM

## 2022-04-18 DIAGNOSIS — M2141 Flat foot [pes planus] (acquired), right foot: Secondary | ICD-10-CM

## 2022-04-18 DIAGNOSIS — M19079 Primary osteoarthritis, unspecified ankle and foot: Secondary | ICD-10-CM

## 2022-04-18 NOTE — Progress Notes (Signed)
Chief Complaint  Patient presents with   Foot Pain    Right /thinks he is here to get something made, asked him if he has the order for [employer] for the AFO he states he does not    Encounter Diagnoses  Name Primary?   Pes planus of right foot Yes   Arthritis of ankle right      87 year old male with flatfoot deformity treated with a cam walker for flareup of inflammation presents back with continued pain in the medial side of his ankle with chronic flatfoot deformity  Reexamination revealed the tenderness and swelling of the posterior tibial tendon.  I felt he needed an MRI to evaluate for possible surgical intervention but he declined he says he does not want surgery so organ to put him in a double upright brace with an AFO he was happy with that and return as needed

## 2022-04-18 NOTE — Progress Notes (Signed)
Chief Complaint  Patient presents with   Foot Pain    Right /thinks he is here to get something made, asked him if he has the order for [employer] for the AFO he states he does not    Patient has limited mobility and meets all of these conditions: They have a health condition that causes significant difficulty moving around in your home. They are unable to do daily living activities (like bathing, dressing, getting in or out of a bed or chair, or using the bathroom) even with the help of a cane, crutch or walker. They are able to safely operate and get on and off the wheelchair or scooter, or have someone who is always available to help safely use the device. The supplier must  visit your home and verify that you can use the equipment within your home (example: it's not too big to fit through doorways in your home or blocked by floor surfaces or things in its path).

## 2022-04-29 ENCOUNTER — Telehealth: Payer: Self-pay | Admitting: Orthopedic Surgery

## 2022-04-29 DIAGNOSIS — M2141 Flat foot [pes planus] (acquired), right foot: Secondary | ICD-10-CM

## 2022-04-29 DIAGNOSIS — M19079 Primary osteoarthritis, unspecified ankle and foot: Secondary | ICD-10-CM

## 2022-04-29 NOTE — Telephone Encounter (Signed)
Power scooter order at front desk for pick up I called him to advise.

## 2022-04-29 NOTE — Telephone Encounter (Signed)
I have put in order for power scooter, if you agree, sign order I will call him

## 2022-04-29 NOTE — Telephone Encounter (Signed)
Patient came in the office with his RX for a power wheelchair and that is not what he wants.  He wants a power scooter.  Please call him back at [phone]

## 2022-05-10 DIAGNOSIS — L821 Other seborrheic keratosis: Secondary | ICD-10-CM | POA: Diagnosis not present

## 2022-05-10 DIAGNOSIS — Z789 Other specified health status: Secondary | ICD-10-CM | POA: Diagnosis not present

## 2022-05-10 DIAGNOSIS — D1801 Hemangioma of skin and subcutaneous tissue: Secondary | ICD-10-CM | POA: Diagnosis not present

## 2022-05-10 DIAGNOSIS — L814 Other melanin hyperpigmentation: Secondary | ICD-10-CM | POA: Diagnosis not present

## 2022-05-10 DIAGNOSIS — L820 Inflamed seborrheic keratosis: Secondary | ICD-10-CM | POA: Diagnosis not present

## 2022-05-10 DIAGNOSIS — R208 Other disturbances of skin sensation: Secondary | ICD-10-CM | POA: Diagnosis not present

## 2022-05-10 DIAGNOSIS — L298 Other pruritus: Secondary | ICD-10-CM | POA: Diagnosis not present

## 2022-05-10 DIAGNOSIS — L538 Other specified erythematous conditions: Secondary | ICD-10-CM | POA: Diagnosis not present

## 2022-05-13 ENCOUNTER — Other Ambulatory Visit: Payer: Self-pay | Admitting: Pulmonary Disease

## 2022-05-23 DIAGNOSIS — M5090 Cervical disc disorder, unspecified, unspecified cervical region: Secondary | ICD-10-CM | POA: Diagnosis not present

## 2022-05-23 DIAGNOSIS — Z23 Encounter for immunization: Secondary | ICD-10-CM | POA: Diagnosis not present

## 2022-05-23 DIAGNOSIS — M542 Cervicalgia: Secondary | ICD-10-CM | POA: Diagnosis not present

## 2022-05-23 DIAGNOSIS — G894 Chronic pain syndrome: Secondary | ICD-10-CM | POA: Diagnosis not present

## 2022-05-23 DIAGNOSIS — I10 Essential (primary) hypertension: Secondary | ICD-10-CM | POA: Diagnosis not present

## 2022-07-30 ENCOUNTER — Telehealth: Payer: Self-pay

## 2022-07-30 DIAGNOSIS — M19079 Primary osteoarthritis, unspecified ankle and foot: Secondary | ICD-10-CM

## 2022-07-30 DIAGNOSIS — M2141 Flat foot [pes planus] (acquired), right foot: Secondary | ICD-10-CM

## 2022-07-30 NOTE — Telephone Encounter (Signed)
There is a copy of it in media, so I dont need new signature  I printed and faxed it to number given

## 2022-07-30 NOTE — Telephone Encounter (Signed)
This fax was sent regarding this order/ looks like they didn't need this order.    Good Afternoon,     Happy New Year!     Please see attached, we received this order today and I'm not sure of why or what is needed. We currently going through a hearing with the pt insurance to obtain Auth. If you have any questions please feel free to contact the office at [phone]. Thanks        Thanks,           April [facility] [address] [place], [place] [address] Office: [phone] Fax: [phone] Email: [email or web address]

## 2022-07-30 NOTE — Telephone Encounter (Signed)
Patient came in stating that he took his shoe to the [facility] to have the brace put in his shoe. They told him that they need a script from Dr. Aline Brochure so they can put the brace in for him. Stated that he was told that the script could be faxed over and they gave him the fax number listed below   [facility] Fax:  [phone]  Ph: [phone]

## 2022-07-30 NOTE — Telephone Encounter (Signed)
He must have lost the order Dr Aline Brochure gave him  I have reordered it will fax when Dr Aline Brochure signs it

## 2022-08-28 ENCOUNTER — Other Ambulatory Visit (HOSPITAL_COMMUNITY): Payer: Self-pay | Admitting: Adult Health Nurse Practitioner

## 2022-08-28 DIAGNOSIS — I2699 Other pulmonary embolism without acute cor pulmonale: Secondary | ICD-10-CM

## 2022-08-28 DIAGNOSIS — J439 Emphysema, unspecified: Secondary | ICD-10-CM

## 2022-08-28 DIAGNOSIS — R0602 Shortness of breath: Secondary | ICD-10-CM

## 2022-08-28 DIAGNOSIS — J441 Chronic obstructive pulmonary disease with (acute) exacerbation: Secondary | ICD-10-CM

## 2022-09-06 ENCOUNTER — Ambulatory Visit (HOSPITAL_COMMUNITY)
Admission: RE | Admit: 2022-09-06 | Discharge: 2022-09-06 | Disposition: A | Discharge status: Home / Self Care | Payer: Medicare Other | Source: Ambulatory Visit | Attending: Adult Health Nurse Practitioner | Admitting: Adult Health Nurse Practitioner

## 2022-09-06 DIAGNOSIS — R0602 Shortness of breath: Secondary | ICD-10-CM | POA: Diagnosis present

## 2022-09-06 DIAGNOSIS — I2699 Other pulmonary embolism without acute cor pulmonale: Secondary | ICD-10-CM | POA: Diagnosis present

## 2022-09-06 DIAGNOSIS — J441 Chronic obstructive pulmonary disease with (acute) exacerbation: Secondary | ICD-10-CM | POA: Diagnosis present

## 2022-09-06 DIAGNOSIS — J439 Emphysema, unspecified: Secondary | ICD-10-CM | POA: Diagnosis present

## 2022-09-06 DIAGNOSIS — J4489 Other specified chronic obstructive pulmonary disease: Secondary | ICD-10-CM | POA: Diagnosis present

## 2022-09-06 MED ORDER — IOHEXOL 350 MG/ML SOLN
75.0000 mL | Freq: Once | INTRAVENOUS | Status: AC | PRN
  Administered 2022-09-06: 75 mL via INTRAVENOUS

## 2022-09-11 ENCOUNTER — Encounter: Payer: Self-pay | Admitting: Pulmonary Disease

## 2022-09-11 ENCOUNTER — Ambulatory Visit (INDEPENDENT_AMBULATORY_CARE_PROVIDER_SITE_OTHER): Payer: Medicare Other | Admitting: Pulmonary Disease

## 2022-09-11 VITALS — BP 130/66 | HR 72 | Ht 68.0 in | Wt 222.2 lb

## 2022-09-11 DIAGNOSIS — J439 Emphysema, unspecified: Secondary | ICD-10-CM | POA: Diagnosis not present

## 2022-09-11 DIAGNOSIS — G4733 Obstructive sleep apnea (adult) (pediatric): Secondary | ICD-10-CM

## 2022-09-11 DIAGNOSIS — J4489 Other specified chronic obstructive pulmonary disease: Secondary | ICD-10-CM | POA: Diagnosis not present

## 2022-09-11 MED ORDER — DOXYCYCLINE HYCLATE 100 MG PO TABS: 100.0000 mg | ORAL_TABLET | Freq: Two times a day (BID) | ORAL | 0 refills | Status: DC

## 2022-09-11 NOTE — Progress Notes (Signed)
[facility]  Chief Complaint  Patient presents with   Follow-up    Patient had CT done at [facility] recently and would like Dr. Halford Chessman to look at it.  In room 3     Constitutional:  BP 130/66   Pulse 72   Ht 5' 8"$  (1.727 m)   Wt 222 lb 3.2 oz (100.8 kg)   SpO2 96% Comment: ra  BMI 33.79 kg/m   Past Medical History:  Chronic back pain, Depression, GERD, HLD, HTN, Hypothyroidism, Hiatal hernia  Past Surgical History:  His  has a past surgical history that includes Skin cancer excision (2013); Prostate surgery; Neck surgery; skin cancer removed; Cataract extraction w/PHACO (Left, 02/13/2016); and Cataract extraction w/PHACO (Right, 05/28/2019).  Brief Summary:  Cristian Yang is a 88 y.o. 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      Subjective:   CT chest from earlier this month showed mild emphysema and hiatal hernia.  No sign of PE.  He has sinus congestion, cough, and phlegm.  Has been going on for past few days.  Inhalers are helping.  Got new CPAP machine about a year ago and working well.  Uses oxygen at night with this.   Physical Exam:   Appearance - well kempt   ENMT - no sinus tenderness, no oral exudate, no LAN, Mallampati 3 airway, no stridor, poor hearing  Respiratory - equal breath sounds bilaterally, no wheezing or rales  CV - s1s2 regular rate and rhythm, no murmurs  Ext - no clubbing, no edema  Skin - no rashes  Psych - normal mood and affect      Pulmonary testing:  PFT 12/12/14 >> FEV1 1.61 (60%), FEV1% 66, TLC 5.48 (82%), DLCO 47%, no BD  Chest Imaging:  CT angio chest 08/30/19 >> hiatal hernia, patchy and tree in bud nodularity in LUL, b/l lower lobe consolidation CT angio chest 07/13/21 >> atherosclerosis, small hiatal hernia CT angio chest 01/04/22 >> RLL PE, upper lobe emphysema, areas of tree in bud  Sleep Tests:  PSG 09/29/02 >> AHI 12, SaO2 low 85%  Cardiac  Tests:  Echo 01/05/22 >> EF 60 to 65%, grade 1 DD, RVSP 30.1 mmHg, mod LA/RA dilation, mild AS, aortic root 39 mm  Social History:  He  reports that he quit smoking about 19 years ago. His smoking use included cigarettes. He has a 60.00 pack-year smoking history. He has never used smokeless tobacco. He reports that he does not drink alcohol and does not use drugs.  Family History:  His family history includes Asthma in his father; Bone cancer in his sister; Diabetes in his mother; Heart disease in his mother; Liver cancer in his brother.     Assessment/Plan:   Acute bronchitis. - will give course of doxycycline - don't think he needs prednisone at this time  COPD with emphysema and asthma. - continue trelegy, singulair - prn albuterol   Obstructive sleep apnea. - he is compliant with CPAP and reports benefit from therapy - he uses [facility] for his DME - current CPAP ordered August 2022 - continue CPAP and oxygen at 2 liters - will get a copy of his CPAP download and call him with results   Upper airway cough with post-nasal drip. - continue nasonex, singulair  - prn zytrec  Pulmonary embolism. - from June 2023 - most recent CT angio chest didn't show evidence for PE - eliquis per PCP  Time Spent Involved in Patient Care on Day of Examination:  35 minutes  Follow up:   Patient Instructions  Doxycycline antibiotic twice per day for 7 days.  Will call with results of your CPAP report.  Follow up in 6 months.  Medication List:   Allergies as of 09/11/2022       Reactions   Amoxicillin-pot Clavulanate Itching   Ibuprofen Hives   Bilateral arm hives after taking ibuprofen   Penicillins Rash   Ciprofloxacin Nausea And Vomiting   Codeine Nausea And Vomiting        Medication List        Accurate as of September 11, 2022 11:13 AM. If you have any questions, ask your nurse or doctor.          albuterol (2.5 MG/3ML) 0.083% nebulizer solution Commonly known as:  PROVENTIL USE (1) IN NEBULIZER EVERY 6 HOURS AS NEEDED FOR SHORTNESS OF BREATH OR WHEEZING.   amLODipine 5 MG tablet Commonly known as: NORVASC Take 1 tablet (5 mg total) by mouth daily.   Eliquis DVT/PE Starter Pack Generic drug: Apixaban Starter Pack (152m and 592m Take as directed on package: start with two-52m64mablets twice daily for 7 days. On day 8, switch to one-52mg80mblet twice daily.   apixaban 5 MG Tabs tablet Commonly known as: ELIQUIS Take 1 tablet (5 mg total) by mouth 2 (two) times daily. Start this after you complete the initial starter pack   calcium elemental as carbonate 400 MG chewable tablet Commonly known as: BARIATRIC TUMS ULTRA Chew 3,000 mg by mouth at bedtime.   citalopram 20 MG tablet Commonly known as: CELEXA Take 1 tablet (20 mg total) by mouth at bedtime.   docusate sodium 100 MG capsule Commonly known as: COLACE TAKE 1 CAPSULE BY MOUTH IN THE MORNING What changed: when to take this   doxycycline 100 MG tablet Commonly known as: VIBRA-TABS Take 1 tablet (100 mg total) by mouth 2 (two) times daily. Started by: VineChesley Mires   gabapentin 300 MG capsule Commonly known as: NEURONTIN TAKE 1 CAPSULE BY MOUTH IN THE MORNING TAKE 2 CAPSULE IN THE EVENING What changed:  how much to take how to take this when to take this   guaiFENesin 600 MG 12 hr tablet Commonly known as: MUCINEX Take 1 tablet (600 mg total) by mouth 2 (two) times daily.   HYDROcodone-acetaminophen 5-325 MG tablet Commonly known as: NORCO/VICODIN Take 1 tablet by mouth every 6 (six) hours as needed for moderate pain or severe pain.   levothyroxine 75 MCG tablet Commonly known as: SYNTHROID Take 1 tablet (75 mcg total) by mouth daily before breakfast.   lidocaine 5 % ointment Commonly known as: XYLOCAINE Apply 1 application topically 3 (three) times daily as needed. Apply to affected area What changed: reasons to take this   losartan 50 MG tablet Commonly known as:  COZAAR Take 0.5 tablets (25 mg total) by mouth at bedtime.   methocarbamol 750 MG tablet Commonly known as: ROBAXIN Take 750-1,500 mg by mouth 3 (three) times daily as needed for muscle spasms.   mometasone 50 MCG/ACT nasal spray Commonly known as: NASONEX Place 2 sprays into the nose daily.   montelukast 10 MG tablet Commonly known as: SINGULAIR Take 1 tablet (10 mg total) by mouth at bedtime.   multivitamin Tabs tablet Take 1 tablet by mouth every morning.   nystatin-triamcinolone cream Commonly known as: MYCOLOG II Apply 1 Application topically daily as needed.   omeprazole  20 MG capsule Commonly known as: PRILOSEC Take 1 capsule (20 mg total) by mouth every morning.   pravastatin 20 MG tablet Commonly known as: PRAVACHOL Take 20 mg by mouth daily.   Trelegy Ellipta 100-62.5-25 MCG/ACT Aepb Generic drug: Fluticasone-Umeclidin-Vilant Inhale 1 puff into the lungs daily.        Signature:  Chesley Mires, MD [place] Pager - [phone] 09/11/2022, 11:13 AM

## 2022-09-11 NOTE — Patient Instructions (Signed)
Doxycycline antibiotic twice per day for 7 days.  Will call with results of your CPAP report.  Follow up in 6 months.

## 2023-02-06 ENCOUNTER — Other Ambulatory Visit: Payer: Self-pay

## 2023-02-06 MED ORDER — TRELEGY ELLIPTA 100-62.5-25 MCG/ACT IN AEPB: 1.0000 | INHALATION_SPRAY | Freq: Every day | RESPIRATORY_TRACT | 11 refills | Status: DC

## 2023-03-05 ENCOUNTER — Encounter (INDEPENDENT_AMBULATORY_CARE_PROVIDER_SITE_OTHER): Payer: Self-pay | Admitting: *Deleted

## 2023-03-07 ENCOUNTER — Encounter (INDEPENDENT_AMBULATORY_CARE_PROVIDER_SITE_OTHER): Payer: Self-pay | Admitting: Gastroenterology

## 2023-03-07 ENCOUNTER — Ambulatory Visit (INDEPENDENT_AMBULATORY_CARE_PROVIDER_SITE_OTHER): Payer: Medicare Other | Admitting: Gastroenterology

## 2023-03-07 VITALS — BP 161/82 | HR 60 | Temp 98.1°F | Ht 68.0 in | Wt 217.5 lb

## 2023-03-07 DIAGNOSIS — R1319 Other dysphagia: Secondary | ICD-10-CM | POA: Diagnosis not present

## 2023-03-07 DIAGNOSIS — K219 Gastro-esophageal reflux disease without esophagitis: Secondary | ICD-10-CM

## 2023-03-07 DIAGNOSIS — K5904 Chronic idiopathic constipation: Secondary | ICD-10-CM | POA: Insufficient documentation

## 2023-03-07 DIAGNOSIS — I10 Essential (primary) hypertension: Secondary | ICD-10-CM | POA: Diagnosis not present

## 2023-03-07 MED ORDER — POLYETHYLENE GLYCOL 3350 17 G PO PACK: 17.0000 g | PACK | Freq: Two times a day (BID) | ORAL | 0 refills | Status: AC

## 2023-03-07 MED ORDER — PSYLLIUM 58.6 % PO PACK: 1.0000 | PACK | Freq: Two times a day (BID) | ORAL | 2 refills | Status: AC

## 2023-03-07 NOTE — H&P (View-Only) (Signed)
Vista Lawman , M.D. Gastroenterology & Hepatology [facility] [address] [place], [place] [address] Primary Care Physician: Roe Rutherford, NP [address] [place] [address]  Chief Complaint: Regurgitation belching and dysphagia . History of Present Illness: Cristian Yang is a 88 y.o. male with hypothyroidism, BPH, COPD, history of PE not on anticoagulation , hyperlipidemia, hypertension, chronic pain syndrome on hydrocodone who presents for evaluation of belching , regurgitation and dysphagia .  Patient reports that for past couple months he reports that he has occasional regurgitation of food with belching after eating which is very bothersome.  He reports this morning he ate cereal and after eating he had belching and regurgitation of undigested cereal.  Denies any weight loss and his appetite is adequate. The patient denies having any fever, chills, hematochezia, melena, hematemesis, abdominal distention, abdominal pain, diarrhea, jaundice, pruritus or weight loss.  Last UJW:JXBJ Last Colonoscopy:2013   Hemoglobin 10 in 2023 MCV 94.  Repeat hemoglobin 12.4 2024 MCV 93 TSH 3.0 T41.07 Normal liver enzymes creatinine 0.93  FHx: neg for any gastrointestinal/liver disease, no malignancies Social: Ex-smoker 20 years ago,No alcohol or illicit drug use Surgical: no abdominal surgeries  Past Medical History: Past Medical History:  Diagnosis Date   Arthritis    Asthma    Chronic back pain    COPD (chronic obstructive pulmonary disease) (HCC)    Depression    GERD (gastroesophageal reflux disease)    Hyperlipidemia    Hypertension    Hypothyroidism    OSA on CPAP    Skin cancer    Sleep apnea    uses CPAP nightly    Past Surgical History: Past Surgical History:  Procedure Laterality Date   CATARACT EXTRACTION W/PHACO Left 02/13/2016   Procedure: CATARACT EXTRACTION PHACO AND INTRAOCULAR LENS PLACEMENT  (IOC);  Surgeon: Jethro Bolus, MD;  Location: [facility];  Service: Ophthalmology;  Laterality: Left;  CDE: 9.23   CATARACT EXTRACTION W/PHACO Right 05/28/2019   Procedure: CATARACT EXTRACTION PHACO AND INTRAOCULAR LENS PLACEMENT (IOC);  Surgeon: Fabio Pierce, MD;  Location: [facility];  Service: Ophthalmology;  Laterality: Right;  CDE: 9.51   NECK SURGERY     cervical disc   PROSTATE SURGERY     TURP, laser   SKIN CANCER EXCISION  2013   skin cancer removed     x3    Family History: Family History  Problem Relation Age of Onset   Diabetes Mother    Heart disease Mother    Liver cancer Brother    Bone cancer Sister    Asthma Father     Social History: Social History   Tobacco Use  Smoking Status Former   Current packs/day: 0.00   Average packs/day: 2.0 packs/day for 30.0 years (60.0 ttl pk-yrs)   Types: Cigarettes   Start date: 07/22/1973   Quit date: 07/23/2003   Years since quitting: 19.6  Smokeless Tobacco Never   Social History   Substance and Sexual Activity  Alcohol Use No   Alcohol/week: 0.0 standard drinks of alcohol   Comment: quit 2005   Social History   Substance and Sexual Activity  Drug Use No    Allergies: Allergies  Allergen Reactions   Amoxicillin-Pot Clavulanate Itching   Ibuprofen Hives    Bilateral arm hives after taking ibuprofen   Penicillins Rash   Ciprofloxacin Nausea And Vomiting   Codeine Nausea And Vomiting    Medications: Current Outpatient Medications  Medication Sig Dispense Refill   amLODipine (NORVASC) 5 MG tablet Take 1 tablet (5 mg total) by mouth daily. 30 tablet 3   citalopram (CELEXA) 20 MG tablet Take 1 tablet (20 mg total) by mouth at bedtime. 30 tablet 3   docusate sodium (COLACE) 100 MG capsule TAKE 1 CAPSULE BY MOUTH IN THE MORNING (Patient taking differently: Take 100 mg by mouth daily.) 28 capsule 0   Fluticasone-Umeclidin-Vilant (TRELEGY ELLIPTA) 100-62.5-25 MCG/ACT AEPB Inhale 1 puff into the lungs daily. 60 each 11    gabapentin (NEURONTIN) 300 MG capsule TAKE 1 CAPSULE BY MOUTH IN THE MORNING TAKE 2 CAPSULE IN THE EVENING (Patient taking differently: Take 300 mg by mouth 3 (three) times daily. TAKE 1 CAPSULE BY MOUTH IN THE MORNING TAKE 2 CAPSULE IN THE EVENING) 84 capsule 0   levothyroxine (SYNTHROID) 50 MCG tablet Take 50 mcg by mouth daily before breakfast.     losartan (COZAAR) 50 MG tablet Take 0.5 tablets (25 mg total) by mouth at bedtime. (Patient taking differently: Take 50 mg by mouth 2 (two) times daily.) 30 tablet 2   mometasone (NASONEX) 50 MCG/ACT nasal spray Place 2 sprays into the nose daily. 51 g 3   montelukast (SINGULAIR) 10 MG tablet Take 1 tablet (10 mg total) by mouth at bedtime. 30 tablet 3   multivitamin (ONE-A-DAY MEN'S) TABS tablet Take 1 tablet by mouth every morning. 30 tablet 11   nystatin-triamcinolone (MYCOLOG II) cream Apply 1 Application topically daily as needed.     pantoprazole (PROTONIX) 40 MG tablet Take 40 mg by mouth 2 (two) times daily.     pravastatin (PRAVACHOL) 20 MG tablet Take 20 mg by mouth daily.     tamsulosin (FLOMAX) 0.4 MG CAPS capsule Take 0.4 mg by mouth at bedtime.     No current facility-administered medications for this visit.    Review of Systems: GENERAL: negative for malaise, night sweats HEENT: No changes in hearing or vision, no nose bleeds or other nasal problems. NECK: Negative for lumps, goiter, pain and significant neck swelling RESPIRATORY: Negative for cough, wheezing CARDIOVASCULAR: Negative for chest pain, leg swelling, palpitations, orthopnea GI: SEE HPI MUSCULOSKELETAL: Negative for joint pain or swelling, back pain, and muscle pain. SKIN: Negative for lesions, rash HEMATOLOGY Negative for prolonged bleeding, bruising easily, and swollen nodes. ENDOCRINE: Negative for cold or heat intolerance, polyuria, polydipsia and goiter. NEURO: negative for tremor, gait imbalance, syncope and seizures. The remainder of the review of systems is  noncontributory.   Physical Exam: BP (!) 161/82   Pulse 60   Temp 98.1 F (36.7 C) (Oral)   Ht 5\' 8"  (1.727 m)   Wt 217 lb 8 oz (98.7 kg)   BMI 33.07 kg/m  GENERAL: The patient is AO x3, in no acute distress. HEENT: Head is normocephalic and atraumatic. EOMI are intact. Mouth is well hydrated and without lesions. NECK: Supple. No masses LUNGS: Clear to auscultation. No presence of rhonchi/wheezing/rales. Adequate chest expansion HEART: RRR, normal s1 and s2. ABDOMEN: Soft, nontender, no guarding, no peritoneal signs, and nondistended. BS +. No masses. EXTREMITIES: Without any cyanosis, clubbing, rash, lesions or edema. NEUROLOGIC: AOx3, no focal motor deficit. SKIN: no jaundice, no rashes   Imaging/Labs: as above  I personally reviewed and interpreted the available labs, imaging and endoscopic files.  Impression and Plan: Cristian Yang is a 88 y.o. male with hypothyroidism, BPH, COPD, history of PE not on anticoagulation , hyperlipidemia, hypertension, chronic pain syndrome on hydrocodone who presents  for evaluation of belching , regurgitation and dysphagia .  #Belching , regurgitation and dysphagia .  This is most likely due to GERD, although new upper GI symptoms above the age of 90 is considered a red flag and as per ACG guideline upper endoscopy is indicated  Patient never had endoscopy.  He is taking PPI incorrectly as he takes it with food  Recs:  -Will proceed with diagnostic upper endoscopy ASA 3   - Pantoprazole 40mg  twice daily- advised to take 30 min before breakfast and dinner   1) Avoid coffee, tea, cola beverages, carbonated beverages, spicy foods, greasy foods, foods high in acid content (e.g. tomatoes and citrus fruits), chocolate, and peppermint 2) Avoid drinking alcoholic beverages 3) Avoid smoking 4) Eat small meals and keep weight within normal range 5) Avoid recumbent posture for 3 hours post-prandially 6) Elevate head of  bed  #Constipation  Patient has occasional hard stools with straining and takes over-the-counter Colace  Ensure adequate fluid intake: Aim for 8 glasses of water daily. Follow a high fiber diet: Include foods such as dates, prunes, pears, and kiwi. Take Miralax twice a day for the first week, then reduce to once daily thereafter. Use Metamucil twice a day.  #HCM  Patient last colonoscopy as per the patient was in 61 at [place] and at this time because his advanced age he is not interested in colonoscopy.  To proceed for colorectal cancer screening above the age of 4 the decision is individualized  #Hypertension The patient was found to have elevated blood pressure when vital signs were checked in the office. The blood pressure was rechecked by the nursing staff and it was found be persistently elevated >140/90 mmHg. I personally advised to the patient to follow up closely with PCP for hypertension control.    All questions were answered.      Vista Lawman, MD Gastroenterology and Hepatology [facility] Gastroenterology   This chart has been completed using [facility] Dictation software, and while attempts have been made to ensure accuracy , certain words and phrases may not be transcribed as intended

## 2023-03-07 NOTE — Patient Instructions (Signed)
It was very nice to meet you today, as dicussed with will plan for the following :  1) Upper endoscopy   2)    - Pantoprazole 40mg  twice daily- advised to take 30 min before breakfast and dinner   1) Avoid coffee, tea, cola beverages, carbonated beverages, spicy foods, greasy foods, foods high in acid content (e.g. tomatoes and citrus fruits), chocolate, and peppermint 2) Avoid drinking alcoholic beverages 3) Avoid smoking 4) Eat small meals and keep weight within normal range 5) Avoid recumbent posture for 3 hours post-prandially 6) Elevate head of bed

## 2023-03-07 NOTE — Progress Notes (Signed)
Vista Lawman , M.D. Gastroenterology & Hepatology [facility] Gastroenterology [address] [place], [place] [address] Primary Care Physician: Roe Rutherford, NP [address] [place] [address]  Chief Complaint: Regurgitation belching and dysphagia . History of Present Illness: Cristian Yang is a 88 y.o. male with hypothyroidism, BPH, COPD, history of PE not on anticoagulation , hyperlipidemia, hypertension, chronic pain syndrome on hydrocodone who presents for evaluation of belching , regurgitation and dysphagia .  Patient reports that for past couple months he reports that he has occasional regurgitation of food with belching after eating which is very bothersome.  He reports this morning he ate cereal and after eating he had belching and regurgitation of undigested cereal.  Denies any weight loss and his appetite is adequate. The patient denies having any fever, chills, hematochezia, melena, hematemesis, abdominal distention, abdominal pain, diarrhea, jaundice, pruritus or weight loss.  Last UJW:JXBJ Last Colonoscopy:2013   Hemoglobin 10 in 2023 MCV 94.  Repeat hemoglobin 12.4 2024 MCV 93 TSH 3.0 T41.07 Normal liver enzymes creatinine 0.93  FHx: neg for any gastrointestinal/liver disease, no malignancies Social: Ex-smoker 20 years ago,No alcohol or illicit drug use Surgical: no abdominal surgeries  Past Medical History: Past Medical History:  Diagnosis Date   Arthritis    Asthma    Chronic back pain    COPD (chronic obstructive pulmonary disease) (HCC)    Depression    GERD (gastroesophageal reflux disease)    Hyperlipidemia    Hypertension    Hypothyroidism    OSA on CPAP    Skin cancer    Sleep apnea    uses CPAP nightly    Past Surgical History: Past Surgical History:  Procedure Laterality Date   CATARACT EXTRACTION W/PHACO Left 02/13/2016   Procedure: CATARACT EXTRACTION PHACO AND INTRAOCULAR LENS PLACEMENT  (IOC);  Surgeon: Jethro Bolus, MD;  Location: [facility];  Service: Ophthalmology;  Laterality: Left;  CDE: 9.23   CATARACT EXTRACTION W/PHACO Right 05/28/2019   Procedure: CATARACT EXTRACTION PHACO AND INTRAOCULAR LENS PLACEMENT (IOC);  Surgeon: Fabio Pierce, MD;  Location: [facility];  Service: Ophthalmology;  Laterality: Right;  CDE: 9.51   NECK SURGERY     cervical disc   PROSTATE SURGERY     TURP, laser   SKIN CANCER EXCISION  2013   skin cancer removed     x3    Family History: Family History  Problem Relation Age of Onset   Diabetes Mother    Heart disease Mother    Liver cancer Brother    Bone cancer Sister    Asthma Father     Social History: Social History   Tobacco Use  Smoking Status Former   Current packs/day: 0.00   Average packs/day: 2.0 packs/day for 30.0 years (60.0 ttl pk-yrs)   Types: Cigarettes   Start date: 07/22/1973   Quit date: 07/23/2003   Years since quitting: 19.6  Smokeless Tobacco Never   Social History   Substance and Sexual Activity  Alcohol Use No   Alcohol/week: 0.0 standard drinks of alcohol   Comment: quit 2005   Social History   Substance and Sexual Activity  Drug Use No    Allergies: Allergies  Allergen Reactions   Amoxicillin-Pot Clavulanate Itching   Ibuprofen Hives    Bilateral arm hives after taking ibuprofen   Penicillins Rash   Ciprofloxacin Nausea And Vomiting   Codeine Nausea And Vomiting    Medications: Current Outpatient Medications  Medication Sig Dispense Refill   amLODipine (NORVASC) 5 MG tablet Take 1 tablet (5 mg total) by mouth daily. 30 tablet 3   citalopram (CELEXA) 20 MG tablet Take 1 tablet (20 mg total) by mouth at bedtime. 30 tablet 3   docusate sodium (COLACE) 100 MG capsule TAKE 1 CAPSULE BY MOUTH IN THE MORNING (Patient taking differently: Take 100 mg by mouth daily.) 28 capsule 0   Fluticasone-Umeclidin-Vilant (TRELEGY ELLIPTA) 100-62.5-25 MCG/ACT AEPB Inhale 1 puff into the lungs daily. 60 each 11    gabapentin (NEURONTIN) 300 MG capsule TAKE 1 CAPSULE BY MOUTH IN THE MORNING TAKE 2 CAPSULE IN THE EVENING (Patient taking differently: Take 300 mg by mouth 3 (three) times daily. TAKE 1 CAPSULE BY MOUTH IN THE MORNING TAKE 2 CAPSULE IN THE EVENING) 84 capsule 0   levothyroxine (SYNTHROID) 50 MCG tablet Take 50 mcg by mouth daily before breakfast.     losartan (COZAAR) 50 MG tablet Take 0.5 tablets (25 mg total) by mouth at bedtime. (Patient taking differently: Take 50 mg by mouth 2 (two) times daily.) 30 tablet 2   mometasone (NASONEX) 50 MCG/ACT nasal spray Place 2 sprays into the nose daily. 51 g 3   montelukast (SINGULAIR) 10 MG tablet Take 1 tablet (10 mg total) by mouth at bedtime. 30 tablet 3   multivitamin (ONE-A-DAY MEN'S) TABS tablet Take 1 tablet by mouth every morning. 30 tablet 11   nystatin-triamcinolone (MYCOLOG II) cream Apply 1 Application topically daily as needed.     pantoprazole (PROTONIX) 40 MG tablet Take 40 mg by mouth 2 (two) times daily.     pravastatin (PRAVACHOL) 20 MG tablet Take 20 mg by mouth daily.     tamsulosin (FLOMAX) 0.4 MG CAPS capsule Take 0.4 mg by mouth at bedtime.     No current facility-administered medications for this visit.    Review of Systems: GENERAL: negative for malaise, night sweats HEENT: No changes in hearing or vision, no nose bleeds or other nasal problems. NECK: Negative for lumps, goiter, pain and significant neck swelling RESPIRATORY: Negative for cough, wheezing CARDIOVASCULAR: Negative for chest pain, leg swelling, palpitations, orthopnea GI: SEE HPI MUSCULOSKELETAL: Negative for joint pain or swelling, back pain, and muscle pain. SKIN: Negative for lesions, rash HEMATOLOGY Negative for prolonged bleeding, bruising easily, and swollen nodes. ENDOCRINE: Negative for cold or heat intolerance, polyuria, polydipsia and goiter. NEURO: negative for tremor, gait imbalance, syncope and seizures. The remainder of the review of systems is  noncontributory.   Physical Exam: BP (!) 161/82   Pulse 60   Temp 98.1 F (36.7 C) (Oral)   Ht 5\' 8"  (1.727 m)   Wt 217 lb 8 oz (98.7 kg)   BMI 33.07 kg/m  GENERAL: The patient is AO x3, in no acute distress. HEENT: Head is normocephalic and atraumatic. EOMI are intact. Mouth is well hydrated and without lesions. NECK: Supple. No masses LUNGS: Clear to auscultation. No presence of rhonchi/wheezing/rales. Adequate chest expansion HEART: RRR, normal s1 and s2. ABDOMEN: Soft, nontender, no guarding, no peritoneal signs, and nondistended. BS +. No masses. EXTREMITIES: Without any cyanosis, clubbing, rash, lesions or edema. NEUROLOGIC: AOx3, no focal motor deficit. SKIN: no jaundice, no rashes   Imaging/Labs: as above  I personally reviewed and interpreted the available labs, imaging and endoscopic files.  Impression and Plan: Cristian Yang is a 88 y.o. male with hypothyroidism, BPH, COPD, history of PE not on anticoagulation , hyperlipidemia, hypertension, chronic pain syndrome on hydrocodone who presents  for evaluation of belching , regurgitation and dysphagia .  #Belching , regurgitation and dysphagia .  This is most likely due to GERD, although new upper GI symptoms above the age of 90 is considered a red flag and as per ACG guideline upper endoscopy is indicated  Patient never had endoscopy.  He is taking PPI incorrectly as he takes it with food  Recs:  -Will proceed with diagnostic upper endoscopy ASA 3   - Pantoprazole 40mg  twice daily- advised to take 30 min before breakfast and dinner   1) Avoid coffee, tea, cola beverages, carbonated beverages, spicy foods, greasy foods, foods high in acid content (e.g. tomatoes and citrus fruits), chocolate, and peppermint 2) Avoid drinking alcoholic beverages 3) Avoid smoking 4) Eat small meals and keep weight within normal range 5) Avoid recumbent posture for 3 hours post-prandially 6) Elevate head of  bed  #Constipation  Patient has occasional hard stools with straining and takes over-the-counter Colace  Ensure adequate fluid intake: Aim for 8 glasses of water daily. Follow a high fiber diet: Include foods such as dates, prunes, pears, and kiwi. Take Miralax twice a day for the first week, then reduce to once daily thereafter. Use Metamucil twice a day.  #HCM  Patient last colonoscopy as per the patient was in 61 at [place] and at this time because his advanced age he is not interested in colonoscopy.  To proceed for colorectal cancer screening above the age of 4 the decision is individualized  #Hypertension The patient was found to have elevated blood pressure when vital signs were checked in the office. The blood pressure was rechecked by the nursing staff and it was found be persistently elevated >140/90 mmHg. I personally advised to the patient to follow up closely with PCP for hypertension control.    All questions were answered.      Vista Lawman, MD Gastroenterology and Hepatology [facility] Gastroenterology   This chart has been completed using [facility] Dictation software, and while attempts have been made to ensure accuracy , certain words and phrases may not be transcribed as intended

## 2023-03-12 ENCOUNTER — Encounter (INDEPENDENT_AMBULATORY_CARE_PROVIDER_SITE_OTHER): Payer: Self-pay

## 2023-03-17 ENCOUNTER — Telehealth (INDEPENDENT_AMBULATORY_CARE_PROVIDER_SITE_OTHER): Payer: Self-pay | Admitting: Gastroenterology

## 2023-03-17 NOTE — Telephone Encounter (Signed)
If he is having diarrhea or liquid stools , he can make miralax once daily and if he continues to have diarrhea than stop miralax. Also start taking metamucil regardless to bulk up stool

## 2023-03-17 NOTE — Telephone Encounter (Signed)
Pt came by office with a few questions. Pt states he has been taking Miralax that was sent in at previous visit and it has made his bowel movements thin. Pt states "its like the squirts". Pt states he is also still taking his stool softener. Pt has not began Fiber at this time. Pt would like to know if he should continue Miralax. (Pt has EGD scheduled for 03/28/23). Please advise. Thank you (Ok to leave message on answering machine)

## 2023-03-18 NOTE — Telephone Encounter (Signed)
Pt came by office and was informed of recommendations.

## 2023-03-25 NOTE — Patient Instructions (Signed)
Cristian Yang [facility]  03/25/2023     @PREFPERIOPPHARMACY @   Your procedure is scheduled on  03/28/2023.   [facility] to [facility] at  0845  A.M.   Call this number if you have problems the morning of surgery:  [phone]  If you experience any cold or flu symptoms such as cough, fever, chills, shortness of breath, etc. between now and your scheduled surgery, please notify us at the above number.   Remember:  Follow the diet instructions given to you by the office.     Take these medicines the morning of surgery with A SIP OF WATER        amlodipine, gabapentin, levothyroxine, pantoprazole.     Do not wear jewelry, make-up or nail polish, including gel polish,  artificial nails, or any other type of covering on natural nails (fingers and  toes).  Do not wear lotions, powders, or perfumes, or deodorant.  Do not shave 48 hours prior to surgery.  Men may shave face and neck.  Do not bring valuables to the hospital.  [facility] is not responsible for any belongings or valuables.  Contacts, dentures or bridgework may not be worn into surgery.  Leave your suitcase in the car.  After surgery it may be brought to your room.  For patients admitted to the hospital, discharge time will be determined by your treatment team.  Patients discharged the day of surgery will not be allowed to drive home and must have someone with them for 24 hours.    Special instructions:   DO NOT smoke tobacco or vape for 24 hours before your procedure.  Please read over the following fact sheets that you were given. Anesthesia Post-op Instructions and Care and Recovery After Surgery      Upper Endoscopy, Adult, Care After After the procedure, it is common to have a sore throat. It is also common to have: Mild stomach pain or discomfort. Bloating. Nausea. Follow these instructions at home: The instructions below may help you care for yourself at home. Your health care provider may give you more  instructions. If you have questions, ask your health care provider. If you were given a sedative during the procedure, it can affect you for several hours. Do not drive or operate machinery until your health care provider says that it is safe. If you will be going home right after the procedure, plan to have a responsible adult: Take you home from the hospital or clinic. You will not be allowed to drive. Care for you for the time you are told. Follow instructions from your health care provider about what you may eat and drink. Return to your normal activities as told by your health care provider. Ask your health care provider what activities are safe for you. Take over-the-counter and prescription medicines only as told by your health care provider. Contact a health care provider if you: Have a sore throat that lasts longer than one day. Have trouble swallowing. Have a fever. Get help right away if you: Vomit blood or your vomit looks like coffee grounds. Have bloody, black, or tarry stools. Have a very bad sore throat or you cannot swallow. Have difficulty breathing or very bad pain in your chest or abdomen. These symptoms may be an emergency. Get help right away. Call 911. Do not wait to see if the symptoms will go away. Do not drive yourself to the hospital. Summary After the procedure, it is common  to have a sore throat, mild stomach discomfort, bloating, and nausea. If you were given a sedative during the procedure, it can affect you for several hours. Do not drive until your health care provider says that it is safe. Follow instructions from your health care provider about what you may eat and drink. Return to your normal activities as told by your health care provider. This information is not intended to replace advice given to you by your health care provider. Make sure you discuss any questions you have with your health care provider. Document Revised: 10/17/2021 Document Reviewed:  10/17/2021 Elsevier Patient Education  2024 Else[employer]. Monitored Anesthesia Care, Care After The following information offers guidance on how to care for yourself after your procedure. Your health care provider may also give you more specific instructions. If you have problems or questions, contact your health care provider. What can I expect after the procedure? After the procedure, it is common to have: Tiredness. Little or no memory about what happened during or after the procedure. Impaired judgment when it comes to making decisions. Nausea or vomiting. Some trouble with balance. Follow these instructions at home: For the time period you were told by your health care provider:  Rest. Do not participate in activities where you could fall or become injured. Do not drive or use machinery. Do not drink alcohol. Do not take sleeping pills or medicines that cause drowsiness. Do not make important decisions or sign legal documents. Do not take care of children on your own. Medicines Take over-the-counter and prescription medicines only as told by your health care provider. If you were prescribed antibiotics, take them as told by your health care provider. Do not stop using the antibiotic even if you start to feel better. Eating and drinking Follow instructions from your health care provider about what you may eat and drink. Drink enough fluid to keep your urine pale yellow. If you vomit: Drink clear fluids slowly and in small amounts as you are able. Clear fluids include water, ice chips, low-calorie sports drinks, and fruit juice that has water added to it (diluted fruit juice). Eat light and bland foods in small amounts as you are able. These foods include bananas, applesauce, rice, lean meats, toast, and crackers. General instructions  Have a responsible adult stay with you for the time you are told. It is important to have someone help care for you until you are awake and  alert. If you have sleep apnea, surgery and some medicines can increase your risk for breathing problems. Follow instructions from your health care provider about wearing your sleep device: When you are sleeping. This includes during daytime naps. While taking prescription pain medicines, sleeping medicines, or medicines that make you drowsy. Do not use any products that contain nicotine or tobacco. These products include cigarettes, chewing tobacco, and vaping devices, such as e-cigarettes. If you need help quitting, ask your health care provider. Contact a health care provider if: You feel nauseous or vomit every time you eat or drink. You feel light-headed. You are still sleepy or having trouble with balance after 24 hours. You get a rash. You have a fever. You have redness or swelling around the IV site. Get help right away if: You have trouble breathing. You have new confusion after you get home. These symptoms may be an emergency. Get help right away. Call 911. Do not wait to see if the symptoms will go away. Do not drive yourself to the hospital. This  information is not intended to replace advice given to you by your health care provider. Make sure you discuss any questions you have with your health care provider. Document Revised: 12/03/2021 Document Reviewed: 12/03/2021 Elsevier Patient Education  2024 [employer].

## 2023-03-26 ENCOUNTER — Encounter (HOSPITAL_COMMUNITY)
Admission: RE | Admit: 2023-03-26 | Discharge: 2023-03-26 | Disposition: A | Discharge status: Home / Self Care | Payer: Medicare Other | Source: Ambulatory Visit | Attending: Gastroenterology | Admitting: Gastroenterology

## 2023-03-26 ENCOUNTER — Encounter (HOSPITAL_COMMUNITY): Payer: Self-pay

## 2023-03-26 ENCOUNTER — Other Ambulatory Visit: Payer: Self-pay

## 2023-03-26 VITALS — BP 161/82 | HR 60 | Temp 98.1°F | Resp 18 | Ht 68.0 in | Wt 217.5 lb

## 2023-03-26 DIAGNOSIS — Z01810 Encounter for preprocedural cardiovascular examination: Secondary | ICD-10-CM | POA: Diagnosis not present

## 2023-03-26 DIAGNOSIS — I10 Essential (primary) hypertension: Secondary | ICD-10-CM | POA: Diagnosis not present

## 2023-03-28 ENCOUNTER — Ambulatory Visit (HOSPITAL_COMMUNITY): Payer: Medicare Other | Admitting: Anesthesiology

## 2023-03-28 ENCOUNTER — Ambulatory Visit (HOSPITAL_BASED_OUTPATIENT_CLINIC_OR_DEPARTMENT_OTHER): Payer: Medicare Other | Admitting: Anesthesiology

## 2023-03-28 ENCOUNTER — Ambulatory Visit (HOSPITAL_COMMUNITY)
Admission: RE | Admit: 2023-03-28 | Discharge: 2023-03-28 | Disposition: A | Discharge status: Home / Self Care | Payer: Medicare Other | Attending: Gastroenterology | Admitting: Gastroenterology

## 2023-03-28 ENCOUNTER — Encounter (HOSPITAL_COMMUNITY)
Admission: RE | Disposition: A | Discharge status: Home / Self Care | Payer: Self-pay | Source: Home / Self Care | Attending: Gastroenterology

## 2023-03-28 DIAGNOSIS — F32A Depression, unspecified: Secondary | ICD-10-CM | POA: Diagnosis not present

## 2023-03-28 DIAGNOSIS — G8929 Other chronic pain: Secondary | ICD-10-CM | POA: Diagnosis not present

## 2023-03-28 DIAGNOSIS — K449 Diaphragmatic hernia without obstruction or gangrene: Secondary | ICD-10-CM | POA: Diagnosis not present

## 2023-03-28 DIAGNOSIS — Z87891 Personal history of nicotine dependence: Secondary | ICD-10-CM | POA: Insufficient documentation

## 2023-03-28 DIAGNOSIS — E039 Hypothyroidism, unspecified: Secondary | ICD-10-CM | POA: Insufficient documentation

## 2023-03-28 DIAGNOSIS — E785 Hyperlipidemia, unspecified: Secondary | ICD-10-CM | POA: Insufficient documentation

## 2023-03-28 DIAGNOSIS — K209 Esophagitis, unspecified without bleeding: Secondary | ICD-10-CM

## 2023-03-28 DIAGNOSIS — K2100 Gastro-esophageal reflux disease with esophagitis, without bleeding: Secondary | ICD-10-CM | POA: Diagnosis not present

## 2023-03-28 DIAGNOSIS — J449 Chronic obstructive pulmonary disease, unspecified: Secondary | ICD-10-CM

## 2023-03-28 DIAGNOSIS — K3189 Other diseases of stomach and duodenum: Secondary | ICD-10-CM | POA: Diagnosis not present

## 2023-03-28 DIAGNOSIS — K31A19 Gastric intestinal metaplasia without dysplasia, unspecified site: Secondary | ICD-10-CM | POA: Diagnosis not present

## 2023-03-28 DIAGNOSIS — K2970 Gastritis, unspecified, without bleeding: Secondary | ICD-10-CM

## 2023-03-28 DIAGNOSIS — J4489 Other specified chronic obstructive pulmonary disease: Secondary | ICD-10-CM | POA: Insufficient documentation

## 2023-03-28 DIAGNOSIS — K31A13 Gastric intestinal metaplasia without dysplasia, involving the fundus: Secondary | ICD-10-CM

## 2023-03-28 DIAGNOSIS — K2980 Duodenitis without bleeding: Secondary | ICD-10-CM | POA: Diagnosis not present

## 2023-03-28 DIAGNOSIS — I10 Essential (primary) hypertension: Secondary | ICD-10-CM

## 2023-03-28 DIAGNOSIS — Z79899 Other long term (current) drug therapy: Secondary | ICD-10-CM | POA: Diagnosis not present

## 2023-03-28 DIAGNOSIS — Z86711 Personal history of pulmonary embolism: Secondary | ICD-10-CM | POA: Diagnosis not present

## 2023-03-28 DIAGNOSIS — M1990 Unspecified osteoarthritis, unspecified site: Secondary | ICD-10-CM | POA: Diagnosis not present

## 2023-03-28 DIAGNOSIS — Z8249 Family history of ischemic heart disease and other diseases of the circulatory system: Secondary | ICD-10-CM | POA: Diagnosis not present

## 2023-03-28 DIAGNOSIS — K31A12 Gastric intestinal metaplasia without dysplasia, involving the body (corpus): Secondary | ICD-10-CM

## 2023-03-28 DIAGNOSIS — K219 Gastro-esophageal reflux disease without esophagitis: Secondary | ICD-10-CM

## 2023-03-28 DIAGNOSIS — N400 Enlarged prostate without lower urinary tract symptoms: Secondary | ICD-10-CM | POA: Insufficient documentation

## 2023-03-28 DIAGNOSIS — R1319 Other dysphagia: Secondary | ICD-10-CM

## 2023-03-28 DIAGNOSIS — G4733 Obstructive sleep apnea (adult) (pediatric): Secondary | ICD-10-CM | POA: Insufficient documentation

## 2023-03-28 DIAGNOSIS — R1310 Dysphagia, unspecified: Secondary | ICD-10-CM | POA: Diagnosis present

## 2023-03-28 HISTORY — PX: BIOPSY: SHX5522

## 2023-03-28 HISTORY — PX: ESOPHAGOGASTRODUODENOSCOPY (EGD) WITH PROPOFOL: SHX5813

## 2023-03-28 SURGERY — ESOPHAGOGASTRODUODENOSCOPY (EGD) WITH PROPOFOL
Anesthesia: General

## 2023-03-28 MED ORDER — LIDOCAINE HCL (CARDIAC) PF 100 MG/5ML IV SOSY
PREFILLED_SYRINGE | INTRAVENOUS | Status: DC | PRN
  Administered 2023-03-28: 50 mg via INTRAVENOUS

## 2023-03-28 MED ORDER — PROPOFOL 10 MG/ML IV BOLUS
INTRAVENOUS | Status: DC | PRN
  Administered 2023-03-28: 50 mg via INTRAVENOUS
  Administered 2023-03-28: 100 mg via INTRAVENOUS

## 2023-03-28 MED ORDER — LACTATED RINGERS IV SOLN: INTRAVENOUS | Status: DC

## 2023-03-28 NOTE — Anesthesia Postprocedure Evaluation (Signed)
Anesthesia Post Note  Patient: Cristian Yang  Procedure(s) Performed: ESOPHAGOGASTRODUODENOSCOPY (EGD) WITH PROPOFOL BIOPSY  Patient location during evaluation: Phase II Anesthesia Type: General Level of consciousness: awake and alert and oriented Pain management: pain level controlled Vital Signs Assessment: post-procedure vital signs reviewed and stable Respiratory status: spontaneous breathing, nonlabored ventilation and respiratory function stable Cardiovascular status: blood pressure returned to baseline and stable Postop Assessment: no apparent nausea or vomiting Anesthetic complications: no  No notable events documented.   Last Vitals:  Vitals:   03/28/23 1207 03/28/23 1215  BP: (!) 101/34 128/72  Pulse: (!) 52   Resp: 15   Temp: 36.6 C   SpO2: 98%     Last Pain:  Vitals:   03/28/23 1215  TempSrc:   PainSc: 0-No pain                 Zenith Kercheval C Amyrah Pinkhasov

## 2023-03-28 NOTE — Anesthesia Procedure Notes (Signed)
Date/Time: 03/28/2023 11:49 AM  Performed by: Franco Nones, CRNAPre-anesthesia Checklist: Patient identified, Emergency Drugs available, Suction available, Timeout performed and Patient being monitored Patient Re-evaluated:Patient Re-evaluated prior to induction Oxygen Delivery Method: Nasal Cannula

## 2023-03-28 NOTE — Op Note (Signed)
[facility] Patient Name: Cristian Yang Procedure Date: 03/28/2023 11:36 AM MRN: [id number] Date of Birth: [birth year] Attending MD: Sanjuan Dame , MD, [id number] CSN: [id number] Age: 88 Admit Type: Outpatient Procedure:                Upper GI endoscopy Indications:              Dysphagia Providers:                Sanjuan Dame, MD, Edrick Kins, RN, Lennice Sites                            Technician, Technician Referring MD:              Medicines:                Monitored Anesthesia Care Complications:            No immediate complications. Estimated Blood Loss:     Estimated blood loss was minimal. Procedure:                Pre-Anesthesia Assessment:                           - Prior to the procedure, a History and Physical                            was performed, and patient medications and                            allergies were reviewed. [name] tolerance of                            previous anesthesia was also reviewed. The risks                            and benefits of the procedure and the sedation                            options and risks were discussed with the patient.                            All questions were answered, and informed consent                            was obtained. Prior Anticoagulants: The patient has                            taken no anticoagulant or antiplatelet agents                            except for aspirin. ASA Grade Assessment: III - A                            patient with severe systemic disease. After  reviewing the risks and benefits, the patient was                            deemed in satisfactory condition to undergo the                            procedure.                           After obtaining informed consent, the endoscope was                            passed under direct vision. Throughout the                            procedure, the patient's blood pressure, pulse, and                             oxygen saturations were monitored continuously. The                            GIF-H190 ([id number]) scope was introduced through the                            mouth, and advanced to the second part of duodenum.                            The upper GI endoscopy was accomplished without                            difficulty. The patient tolerated the procedure                            well. Scope In: 11:55:32 AM Scope Out: 12:01:45 PM Total Procedure Duration: 0 hours 6 minutes 13 seconds  Findings:      LA Grade B (one or more mucosal breaks greater than 5 mm, not extending       between the tops of two mucosal folds) esophagitis with no bleeding was       found in the lower third of the esophagus.      Esophagogastric landmarks were identified: the Z-line was found at 36 cm       and the gastroesophageal junction was found at 42 cm from the incisors.      There is no endoscopic evidence of stenosis or stricture in the entire       esophagus. Biopsies were obtained from the proximal and distal esophagus       with cold forceps for histology of suspected eosinophilic esophagitis.      A 6 cm hiatal hernia was present.      Mild inflammation characterized by erythema was found in the entire       examined stomach. Biopsies were taken with a cold forceps for histology.      Mildly erythematous mucosa without active bleeding and with no stigmata       of bleeding was found in the duodenal bulb. Impression:               -  LA Grade B esophagitis with no bleeding.                           - Esophagogastric landmarks identified.                           - 6 cm hiatal hernia.                           - Gastritis. Biopsied.                           - Erythematous duodenopathy.                           - Biopsies were taken with a cold forceps for                            evaluation of eosinophilic esophagitis. Moderate Sedation:      Per Anesthesia  Care Recommendation:           - Patient has a contact number available for                            emergencies. The signs and symptoms of potential                            delayed complications were discussed with the                            patient. Return to normal activities tomorrow.                            Written discharge instructions were provided to the                            patient.                           - Resume previous diet.                           - Continue present medications.                           - Await pathology results.                           -Protonix 40mg  , twice daily , 30 min before                            breakfast and 30 min before dinner Procedure Code(s):        --- Professional ---                           [phone], Esophagogastroduodenoscopy, flexible,  transoral; with biopsy, single or multiple Diagnosis Code(s):        --- Professional ---                           K20.90, Esophagitis, unspecified without bleeding                           K44.9, Diaphragmatic hernia without obstruction or                            gangrene                           K29.70, Gastritis, unspecified, without bleeding                           K31.89, Other diseases of stomach and duodenum                           R13.10, Dysphagia, unspecified CPT copyright 2022 American Medical Association. All rights reserved. The codes documented in this report are preliminary and upon coder review may  be revised to meet current compliance requirements. Sanjuan Dame, MD Sanjuan Dame, MD 03/28/2023 12:06:31 PM This report has been signed electronically. Number of Addenda: 0

## 2023-03-28 NOTE — Interval H&P Note (Signed)
History and Physical Interval Note:  03/28/2023 11:40 AM  Cristian Yang  has presented today for surgery, with the diagnosis of dysphagia, regurgitation.  The various methods of treatment have been discussed with the patient and family. After consideration of risks, benefits and other options for treatment, the patient has consented to  Procedure(s) with comments: ESOPHAGOGASTRODUODENOSCOPY (EGD) WITH PROPOFOL (N/A) - 11:00am;asa 3 as a surgical intervention.  [name] history has been reviewed, patient examined, no change in status, stable for surgery.  I have reviewed the patient's chart and labs.  Questions were answered to the patient's satisfaction.     Cristian Yang

## 2023-03-28 NOTE — Discharge Instructions (Signed)
  Discharge instructions Please read the instructions outlined below and refer to this sheet in the next few weeks. These discharge instructions provide you with general information on caring for yourself after you leave the hospital. Your doctor may also give you specific instructions. While your treatment has been planned according to the most current medical practices available, unavoidable complications occasionally occur. If you have any problems or questions after discharge, please call your doctor. ACTIVITY You may resume your regular activity but move at a slower pace for the next 24 hours.  Take frequent rest periods for the next 24 hours.  Walking will help expel (get rid of) the air and reduce the bloated feeling in your abdomen.  No driving for 24 hours (because of the anesthesia (medicine) used during the test).  You may shower.  Do not sign any important legal documents or operate any machinery for 24 hours (because of the anesthesia used during the test).  NUTRITION Drink plenty of fluids.  You may resume your normal diet.  Begin with a light meal and progress to your normal diet.  Avoid alcoholic beverages for 24 hours or as instructed by your caregiver.  MEDICATIONS You may resume your normal medications unless your caregiver tells you otherwise.  WHAT YOU CAN EXPECT TODAY You may experience abdominal discomfort such as a feeling of fullness or "gas" pains.  FOLLOW-UP Your doctor will discuss the results of your test with you.  SEEK IMMEDIATE MEDICAL ATTENTION IF ANY OF THE FOLLOWING OCCUR: Excessive nausea (feeling sick to your stomach) and/or vomiting.  Severe abdominal pain and distention (swelling).  Trouble swallowing.  Temperature over 101 F (37.8 C).  Rectal bleeding or vomiting of blood.       - Pantoprazole 40mg /po/twice daily- advised to take 30 min before breakfast and dinner   GERD recommendations:  1) Avoid coffee, tea, cola beverages, carbonated  beverages, spicy foods, greasy foods, foods high in acid content (e.g. tomatoes and citrus fruits), chocolate, and peppermint 2) Avoid drinking alcoholic beverages 3) Avoid smoking 4) Eat small meals and keep weight within normal range 5) Avoid recumbent posture for 3 hours post-prandially 6) Elevate head of bed    I hope you have a great rest of your week!   Cristian Yang , M.D.. Gastroenterology and Hepatology [facility]

## 2023-03-28 NOTE — Anesthesia Preprocedure Evaluation (Signed)
Anesthesia Evaluation  Patient identified by MRN, date of birth, ID band Patient awake    Reviewed: Allergy & Precautions, H&P , NPO status , Patient's Chart, lab work & pertinent test results  Airway Mallampati: II  TM Distance: >3 FB Neck ROM: Full    Dental  (+) Edentulous Upper, Edentulous Lower   Pulmonary asthma , sleep apnea (oxygen during night), Continuous Positive Airway Pressure Ventilation and Oxygen sleep apnea , pneumonia, COPD,  COPD inhaler, former smoker   Pulmonary exam normal breath sounds clear to auscultation       Cardiovascular hypertension, Pt. on medications  Rhythm:Regular Rate:Normal + Systolic murmurs 1. Left ventricular ejection fraction, by estimation, is 60 to 65%. The  left ventricle has normal function. The left ventricle has no regional  wall motion abnormalities. Left ventricular diastolic parameters are  consistent with Grade I diastolic  dysfunction (impaired relaxation).   2. Right ventricular systolic function is hyperdynamic. The right  ventricular size is normal. There is normal pulmonary artery systolic  pressure. The estimated right ventricular systolic pressure is 30.1 mmHg.   3. Left atrial size was moderately dilated.   4. Right atrial size was moderately dilated.   5. The mitral valve is grossly normal. No evidence of mitral valve  regurgitation.   6. The aortic valve is tricuspid. Aortic valve regurgitation is not  visualized. Mild aortic valve stenosis. Aortic valve area, by VTI measures  1.74 cm. Aortic valve mean gradient measures 13.0 mmHg. Aortic valve Vmax  measures 2.30 m/s. DI is 0.50.   7. Aortic dilatation noted. There is borderline dilatation of the aortic  root, measuring 39 mm.     Neuro/Psych  PSYCHIATRIC DISORDERS  Depression    negative neurological ROS     GI/Hepatic Neg liver ROS,GERD  Medicated and Controlled,,  Endo/Other  Hypothyroidism     Renal/GU negative Renal ROS  negative genitourinary   Musculoskeletal  (+) Arthritis , Osteoarthritis,    Abdominal   Peds negative pediatric ROS (+)  Hematology negative hematology ROS (+)   Anesthesia Other Findings   Reproductive/Obstetrics negative OB ROS                             Anesthesia Physical Anesthesia Plan  ASA: 3  Anesthesia Plan: General   Post-op Pain Management: Minimal or no pain anticipated   Induction: Intravenous  PONV Risk Score and Plan: 1 and Propofol infusion  Airway Management Planned: Nasal Cannula and Natural Airway  Additional Equipment:   Intra-op Plan:   Post-operative Plan:   Informed Consent: I have reviewed the patients History and Physical, chart, labs and discussed the procedure including the risks, benefits and alternatives for the proposed anesthesia with the patient or authorized representative who has indicated his/her understanding and acceptance.     Dental advisory given  Plan Discussed with: CRNA and Surgeon  Anesthesia Plan Comments:         Anesthesia Quick Evaluation

## 2023-03-28 NOTE — Transfer of Care (Signed)
Immediate Anesthesia Transfer of Care Note  Patient: Cristian Yang  Procedure(s) Performed: ESOPHAGOGASTRODUODENOSCOPY (EGD) WITH PROPOFOL BIOPSY  Patient Location: Short Stay  Anesthesia Type:General  Level of Consciousness: awake and patient cooperative  Airway & Oxygen Therapy: Patient Spontanous Breathing  Post-op Assessment: Report given to RN and Post -op Vital signs reviewed and stable  Post vital signs: Reviewed and stable  Last Vitals:  Vitals Value Taken Time  BP 101/34 09*06/24  1208  Temp 36.6 C 03/28/23 1207  Pulse 52 03/28/23 1207  Resp 15 03/28/23 1207  SpO2 98 % 03/28/23 1207    Last Pain:  Vitals:   03/28/23 1150  TempSrc:   PainSc: 0-No pain      Patients Stated Pain Goal: 5 (03/28/23 0917)  Complications: No notable events documented.

## 2023-03-31 LAB — SURGICAL PATHOLOGY

## 2023-04-02 NOTE — Progress Notes (Signed)
I reviewed the pathology results. [name], can you send her a letter with the findings as described below please? Repeat  upper endoscopy 1 year  Thanks,  Vista Lawman, MD Gastroenterology and Hepatology [facility]  ---------------------------------------------------------------------------------------------  [facility] [address], [address], [place], [place] [address] Phone:  [phone]   04/02/23 Cristian Yang, [place]   Dear [name],  I am writing to inform you that the biopsies taken during your recent endoscopic examination showed:  No H. Pylori bacteria in stomach ,  Normal biopsies of the food-pipe ( no eosinophilic esophagitis )  But shows change of lining of stomach mucosa ( Intestinal metaplasia) for which I would recommend biopsies again in 1 year   For now take Protonix 40mg  daily , 30 min before breakfast   Please call us at [phone] if you have persistent problems or have questions about your condition that have not been fully answered at this time.  Sincerely,  Vista Lawman, MD Gastroenterology and Hepatology  ---------------------  Would recommend mapping biopsies in future given history of intestinal metaplasia

## 2023-04-03 ENCOUNTER — Encounter (INDEPENDENT_AMBULATORY_CARE_PROVIDER_SITE_OTHER): Payer: Self-pay | Admitting: *Deleted

## 2023-04-04 ENCOUNTER — Encounter (HOSPITAL_COMMUNITY): Payer: Self-pay | Admitting: Gastroenterology

## 2023-04-21 ENCOUNTER — Ambulatory Visit (INDEPENDENT_AMBULATORY_CARE_PROVIDER_SITE_OTHER): Payer: Medicare Other | Admitting: Pulmonary Disease

## 2023-04-21 ENCOUNTER — Encounter: Payer: Self-pay | Admitting: Pulmonary Disease

## 2023-04-21 VITALS — BP 146/73 | HR 63 | Ht 68.0 in | Wt 218.8 lb

## 2023-04-21 DIAGNOSIS — J4489 Other specified chronic obstructive pulmonary disease: Secondary | ICD-10-CM

## 2023-04-21 DIAGNOSIS — J439 Emphysema, unspecified: Secondary | ICD-10-CM | POA: Diagnosis not present

## 2023-04-21 DIAGNOSIS — G4733 Obstructive sleep apnea (adult) (pediatric): Secondary | ICD-10-CM

## 2023-04-21 NOTE — Progress Notes (Signed)
[facility]  Chief Complaint  Patient presents with   Follow-up    6 month follow up     Constitutional:  BP (!) 146/73   Pulse 63   Ht 5\' 8"  (1.727 m)   Wt 218 lb 12.8 oz (99.2 kg)   SpO2 91%   BMI 33.27 kg/m   Past Medical History:  Chronic back pain, Depression, GERD, HLD, HTN, Hypothyroidism, Hiatal hernia, Pulmonary embolism June 2023  Past Surgical History:  His  has a past surgical history that includes Skin cancer excision (2013); Prostate surgery; Neck surgery; skin cancer removed; Cataract extraction w/PHACO (Left, 02/13/2016); Cataract extraction w/PHACO (Right, 05/28/2019); Esophagogastroduodenoscopy (egd) with propofol (N/A, 03/28/2023); and biopsy (03/28/2023).  Brief Summary:  Cristian Yang is a 88 y.o. 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      Subjective:   Breathing has been okay.  Not having cough, wheeze, or sputum.  Sleeping okay.  Not having chest pain or leg swelling.  Keeps up with activity at home.  Got his flu shot already.  Physical Exam:   Appearance - well kempt   ENMT - no sinus tenderness, no oral exudate, no LAN, Mallampati 3 airway, no stridor, poor hearing  Respiratory - equal breath sounds bilaterally, no wheezing or rales  CV - s1s2 regular rate and rhythm, no murmurs  Ext - no clubbing, no edema  Skin - no rashes  Psych - normal mood and affect      Pulmonary testing:  PFT 12/12/14 >> FEV1 1.61 (60%), FEV1% 66, TLC 5.48 (82%), DLCO 47%, no BD  Chest Imaging:  CT angio chest 08/30/19 >> hiatal hernia, patchy and tree in bud nodularity in LUL, b/l lower lobe consolidation CT angio chest 07/13/21 >> atherosclerosis, small hiatal hernia CT angio chest 01/04/22 >> RLL PE, upper lobe emphysema, areas of tree in bud CT angio chest 09/06/22 >> mod HH, no PE  Sleep Tests:  PSG 09/29/02 >> AHI 12, SaO2 low 85%  Cardiac Tests:  Echo 01/05/22 >> EF 60 to  65%, grade 1 DD, RVSP 30.1 mmHg, mod LA/RA dilation, mild AS, aortic root 39 mm  Social History:  He  reports that he quit smoking about 19 years ago. His smoking use included cigarettes. He started smoking about 49 years ago. He has a 60 pack-year smoking history. He has never used smokeless tobacco. He reports that he does not drink alcohol and does not use drugs.  Family History:  His family history includes Asthma in his father; Bone cancer in his sister; Diabetes in his mother; Heart disease in his mother; Liver cancer in his brother.     Assessment/Plan:   COPD with emphysema and asthma. - continue trelegy 100 one puff daily, singulair 10 mg nightly - prn albuterol   Obstructive sleep apnea. - he is compliant with CPAP and reports benefit from therapy - he uses [facility] for his DME - current CPAP ordered August 2022 - continue CPAP and oxygen at 2 liters   Upper airway cough with post-nasal drip. - continue nasonex, singulair  - prn zytrec  Time Spent Involved in Patient Care on Day of Examination:  27 minutes  Follow up:   Patient Instructions  Follow up in 6 months  Medication List:   Allergies as of 04/21/2023       Reactions   Amoxicillin-pot Clavulanate Itching   Ibuprofen Hives   Bilateral arm hives after  taking ibuprofen   Penicillins Rash   Ciprofloxacin Nausea And Vomiting   Codeine Nausea And Vomiting        Medication List        Accurate as of April 21, 2023  9:44 AM. If you have any questions, ask your nurse or doctor.          amLODipine 5 MG tablet Commonly known as: NORVASC Take 1 tablet (5 mg total) by mouth daily.   citalopram 20 MG tablet Commonly known as: CELEXA Take 1 tablet (20 mg total) by mouth at bedtime.   docusate sodium 100 MG capsule Commonly known as: COLACE TAKE 1 CAPSULE BY MOUTH IN THE MORNING What changed: when to take this   gabapentin 300 MG capsule Commonly known as: NEURONTIN TAKE 1 CAPSULE BY MOUTH  IN THE MORNING TAKE 2 CAPSULE IN THE EVENING What changed:  how much to take how to take this when to take this   levothyroxine 50 MCG tablet Commonly known as: SYNTHROID Take 50 mcg by mouth daily before breakfast.   losartan 50 MG tablet Commonly known as: COZAAR Take 0.5 tablets (25 mg total) by mouth at bedtime. What changed:  how much to take when to take this   mometasone 50 MCG/ACT nasal spray Commonly known as: NASONEX Place 2 sprays into the nose daily.   montelukast 10 MG tablet Commonly known as: SINGULAIR Take 1 tablet (10 mg total) by mouth at bedtime.   multivitamin Tabs tablet Take 1 tablet by mouth every morning.   nystatin-triamcinolone cream Commonly known as: MYCOLOG II Apply 1 Application topically daily as needed.   pantoprazole 40 MG tablet Commonly known as: PROTONIX Take 40 mg by mouth 2 (two) times daily.   polyethylene glycol 17 g packet Commonly known as: MIRALAX / GLYCOLAX Take 17 g by mouth 2 (two) times daily.   pravastatin 20 MG tablet Commonly known as: PRAVACHOL Take 20 mg by mouth daily.   psyllium 58.6 % packet Commonly known as: METAMUCIL Take 1 packet by mouth 2 (two) times daily.   tamsulosin 0.4 MG Caps capsule Commonly known as: FLOMAX Take 0.4 mg by mouth at bedtime.   Trelegy Ellipta 100-62.5-25 MCG/ACT Aepb Generic drug: Fluticasone-Umeclidin-Vilant Inhale 1 puff into the lungs daily.        Signature:  Coralyn Helling, MD [facility]Critical [facility] Pager - [phone] 04/21/2023, 9:44 AM

## 2023-04-21 NOTE — Patient Instructions (Signed)
Follow up in 6 months 

## 2023-10-08 ENCOUNTER — Other Ambulatory Visit: Payer: Self-pay | Admitting: Adult Health Nurse Practitioner

## 2023-10-08 ENCOUNTER — Ambulatory Visit (HOSPITAL_COMMUNITY)
Admission: RE | Admit: 2023-10-08 | Discharge: 2023-10-08 | Disposition: A | Discharge status: Home / Self Care | Source: Ambulatory Visit | Attending: Adult Health Nurse Practitioner | Admitting: Adult Health Nurse Practitioner

## 2023-10-08 ENCOUNTER — Encounter (HOSPITAL_COMMUNITY): Payer: Self-pay

## 2023-10-08 DIAGNOSIS — Z86711 Personal history of pulmonary embolism: Secondary | ICD-10-CM

## 2023-10-08 MED ORDER — IOHEXOL 350 MG/ML SOLN
75.0000 mL | Freq: Once | INTRAVENOUS | Status: AC | PRN
  Administered 2023-10-08: 75 mL via INTRAVENOUS

## 2023-11-28 ENCOUNTER — Ambulatory Visit: Attending: Nurse Practitioner | Admitting: Nurse Practitioner

## 2023-11-28 ENCOUNTER — Encounter: Payer: Self-pay | Admitting: Nurse Practitioner

## 2023-11-28 VITALS — BP 124/72 | HR 62 | Ht 68.0 in | Wt 222.0 lb

## 2023-11-28 DIAGNOSIS — I350 Nonrheumatic aortic (valve) stenosis: Secondary | ICD-10-CM | POA: Diagnosis present

## 2023-11-28 DIAGNOSIS — I77810 Thoracic aortic ectasia: Secondary | ICD-10-CM | POA: Insufficient documentation

## 2023-11-28 DIAGNOSIS — I10 Essential (primary) hypertension: Secondary | ICD-10-CM | POA: Diagnosis present

## 2023-11-28 DIAGNOSIS — E785 Hyperlipidemia, unspecified: Secondary | ICD-10-CM | POA: Diagnosis present

## 2023-11-28 DIAGNOSIS — I2510 Atherosclerotic heart disease of native coronary artery without angina pectoris: Secondary | ICD-10-CM | POA: Diagnosis present

## 2023-11-28 DIAGNOSIS — G4733 Obstructive sleep apnea (adult) (pediatric): Secondary | ICD-10-CM | POA: Insufficient documentation

## 2023-11-28 NOTE — Patient Instructions (Signed)

## 2023-11-28 NOTE — Progress Notes (Unsigned)
 Cardiology Office Note:  .   Date:  11/28/2023 ID:  Cristian Yang, DOB Dec 17, 1934, MRN [id number] PCP: Cristian Rosin, NP  [facility] Providers Cardiologist:  Cristian Lites, MD    History of Present Illness: .   Cristian Yang is a 89 y.o. male with a PMH of nonobstructive CAD, aortic valve stenosis, hypertension, hyperlipidemia, hypothyroidism, OSA on CPAP, COPD, asthma, GERD, who presents today for overdue follow-up.  Last seen by Cristian Andrew, NP on February 12, 2021. Was overall doing well at the time.   Today he presents for overdue follow-up.  He states he is doing very well.  Since he was last seen in our office, he tells me he has had several cancer spots removed on his skin.  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  Wife passed away few years ago, however he is doing very well.  Continues to remain very active. Denies any chest pain, shortness of breath, palpitations, syncope, presyncope, dizziness, orthopnea, PND, swelling or significant weight changes, acute bleeding, or claudication.   ROS: Negative. See HPI.   Studies Reviewed: Cristian Yang    EKG: EKG Interpretation Date/Time:  Friday Nov 28 2023 15:33:05 EDT Ventricular Rate:  62 PR Interval:  220 QRS Duration:  158 QT Interval:  452 QTC Calculation: 458 R Axis:   -70  Text Interpretation: Sinus rhythm with 1st degree A-V block Right bundle branch block Left anterior fascicular block Bifascicular block When compared with ECG of 26-Mar-2023 11:10, No significant change was found Confirmed by Cristian Yang [phone]) on 11/28/2023 3:39:59 PM   Echo 12/2021:   1. Left ventricular ejection fraction, by estimation, is 60 to 65%. The  left ventricle has normal function. The left ventricle has no regional  wall motion abnormalities. Left ventricular diastolic parameters are  consistent with Grade I diastolic  dysfunction (impaired relaxation).   2. Right ventricular systolic  function is hyperdynamic. The right  ventricular size is normal. There is normal pulmonary artery systolic  pressure. The estimated right ventricular systolic pressure is 30.1 mmHg.   3. Left atrial size was moderately dilated.   4. Right atrial size was moderately dilated.   5. The mitral valve is grossly normal. No evidence of mitral valve  regurgitation.   6. The aortic valve is tricuspid. Aortic valve regurgitation is not  visualized. Mild aortic valve stenosis. Aortic valve area, by VTI measures  1.74 cm. Aortic valve mean gradient measures 13.0 mmHg. Aortic valve Vmax  measures 2.30 m/s. DI is 0.50.   7. Aortic dilatation noted. There is borderline dilatation of the aortic  root, measuring 39 mm.   Comparison(s): Changes from prior study are noted. 08/29/2020: LVEF 60-65%, mild AS.  Cardiac cath 04/2013:  Impression: 1.  Mild nonocclusive coronary artery disease in a right dominant system. 2.  Normal left ventricular function with ejection fraction of 65%. 3.  Moderate systemic pressures with mildly elevated left ventricular end-diastolic pressure.  Plan: Continue with aggressive secondary CAD preventative measures.   Physical Exam:   VS:  BP 124/72   Pulse 62   Ht 5\' 8"  (1.727 m)   Wt 222 lb (100.7 kg)   BMI 33.75 kg/m    Wt Readings from Last 3 Encounters:  11/28/23 222 lb (100.7 kg)  04/21/23 218 lb 12.8 oz (99.2 kg)  03/26/23 217 lb 8 oz (98.7 kg)    GEN: Well nourished, well developed in no acute distress  NECK: No JVD; No carotid bruits CARDIAC: S1/S2, RRR, Grade 1/6 murmur, no rubs, no gallops RESPIRATORY:  Clear to auscultation without rales, wheezing or rhonchi  ABDOMEN: Soft, non-tender, non-distended EXTREMITIES:  No edema; No deformity   ASSESSMENT AND PLAN: .    Aortic valve stenosis, aortic root dilatation Echo in 2023 showed mild aortic valve stenosis with mean gradient 13.0 mmHg.  Also revealed borderline dilatation of aortic root, measuring 39 mm.   Denies any concerning symptoms.  Did discuss updating echocardiogram/regular surveillance.  Patient declines.  Came to shared medical decision with patient we will d/c regular surveillance at this time due to his advanced age. No medication changes at this time. Care and ED precautions discussed.   Nonobstructive CAD Stable with no anginal symptoms. No indication for ischemic evaluation. Continue Aspirin , losartan , and pravastatin . Heart healthy diet and regular cardiovascular exercise encouraged. Care and ED precautions discussed.   Hypertension BP is well controlled. Discussed to monitor BP at home at least 2 hours after medications and sitting for 5-10 minutes.  Continue current medication regimen. Heart healthy diet and regular cardiovascular exercise encouraged. Continue to follow with PCP.  Hyperlipidemia LDL March 2025 was 56.  Continue pravastatin . Heart healthy diet and regular cardiovascular exercise encouraged.   5.  OSA on CPAP Encouraged continued compliance.   Dispo: Follow-up with Dr. Maximo Yang or APP in 6 months or sooner if any changes.  Signed, Cristian Pointer, NP

## 2024-02-13 ENCOUNTER — Other Ambulatory Visit: Payer: Self-pay

## 2024-02-13 ENCOUNTER — Emergency Department (HOSPITAL_COMMUNITY)
Admission: EM | Admit: 2024-02-13 | Discharge: 2024-02-13 | Disposition: A | Discharge status: Home / Self Care | Attending: Emergency Medicine | Admitting: Emergency Medicine

## 2024-02-13 ENCOUNTER — Encounter (HOSPITAL_COMMUNITY): Payer: Self-pay | Admitting: Emergency Medicine

## 2024-02-13 ENCOUNTER — Emergency Department (HOSPITAL_COMMUNITY)

## 2024-02-13 DIAGNOSIS — R109 Unspecified abdominal pain: Secondary | ICD-10-CM | POA: Insufficient documentation

## 2024-02-13 DIAGNOSIS — Z7982 Long term (current) use of aspirin: Secondary | ICD-10-CM | POA: Insufficient documentation

## 2024-02-13 DIAGNOSIS — R3911 Hesitancy of micturition: Secondary | ICD-10-CM | POA: Diagnosis present

## 2024-02-13 LAB — URINALYSIS, W/ REFLEX TO CULTURE (INFECTION SUSPECTED)
Bacteria, UA: NONE SEEN
Bilirubin Urine: NEGATIVE
Glucose, UA: NEGATIVE mg/dL
Hgb urine dipstick: NEGATIVE
Ketones, ur: NEGATIVE mg/dL
Leukocytes,Ua: NEGATIVE
Nitrite: NEGATIVE
Protein, ur: NEGATIVE mg/dL
Specific Gravity, Urine: 1.006 (ref 1.005–1.030)
pH: 7 (ref 5.0–8.0)

## 2024-02-13 LAB — CBC WITH DIFFERENTIAL/PLATELET
Abs Immature Granulocytes: 0 K/uL (ref 0.00–0.07)
Basophils Absolute: 0 K/uL (ref 0.0–0.1)
Basophils Relative: 0 %
Eosinophils Absolute: 0.5 K/uL (ref 0.0–0.5)
Eosinophils Relative: 5 %
HCT: 34.5 % — ABNORMAL LOW (ref 39.0–52.0)
Hemoglobin: 11.7 g/dL — ABNORMAL LOW (ref 13.0–17.0)
Lymphocytes Relative: 20 %
Lymphs Abs: 1.9 K/uL (ref 0.7–4.0)
MCH: 32.1 pg (ref 26.0–34.0)
MCHC: 33.9 g/dL (ref 30.0–36.0)
MCV: 94.5 fL (ref 80.0–100.0)
Monocytes Absolute: 3.5 K/uL — ABNORMAL HIGH (ref 0.1–1.0)
Monocytes Relative: 36 %
Neutro Abs: 3.7 K/uL (ref 1.7–7.7)
Neutrophils Relative %: 39 %
Platelets: 197 K/uL (ref 150–400)
RBC: 3.65 MIL/uL — ABNORMAL LOW (ref 4.22–5.81)
RDW: 13.7 % (ref 11.5–15.5)
WBC: 9.6 K/uL (ref 4.0–10.5)
nRBC: 0 % (ref 0.0–0.2)

## 2024-02-13 LAB — BASIC METABOLIC PANEL WITH GFR
Anion gap: 13 (ref 5–15)
BUN: 13 mg/dL (ref 8–23)
CO2: 27 mmol/L (ref 22–32)
Calcium: 9.1 mg/dL (ref 8.9–10.3)
Chloride: 94 mmol/L — ABNORMAL LOW (ref 98–111)
Creatinine, Ser: 0.94 mg/dL (ref 0.61–1.24)
GFR, Estimated: >60 mL/min (ref >60)
Glucose, Bld: 110 mg/dL — ABNORMAL HIGH (ref 70–99)
Potassium: 4.2 mmol/L (ref 3.5–5.1)
Sodium: 134 mmol/L — ABNORMAL LOW (ref 135–145)

## 2024-02-13 MED ORDER — CEPHALEXIN 500 MG PO CAPS
500.0000 mg | ORAL_CAPSULE | Freq: Once | ORAL | Status: AC
  Administered 2024-02-13: 500 mg via ORAL
  Filled 2024-02-13: qty 1

## 2024-02-13 MED ORDER — CEPHALEXIN 500 MG PO CAPS: 500.0000 mg | ORAL_CAPSULE | Freq: Three times a day (TID) | ORAL | 0 refills | Status: DC

## 2024-02-13 NOTE — ED Notes (Signed)
 Patient transported to CT

## 2024-02-13 NOTE — ED Provider Notes (Signed)
 Cristian Yang EMERGENCY DEPARTMENT AT [facility] Provider Note   CSN: [id number] Arrival date & time: 02/13/24  0006     Patient presents with: Urinary Retention and Flank Pain   Cristian Yang is a 89 y.o. male.   Patient presents to the emergency department for evaluation of urinary difficulty.  Patient reports that since around 3 PM he has been having difficulty passing his urine, has been mostly dribbling.  He does report some pain around into the right flank.       Prior to Admission medications   Medication Sig Start Date End Date Taking? Authorizing Provider  cephALEXin (KEFLEX) 500 MG capsule Take 1 capsule (500 mg total) by mouth 3 (three) times daily. 02/13/24  Yes Narcisa Ganesh, Lonni PARAS, MD  aspirin  EC 81 MG tablet Take 81 mg by mouth daily. Swallow whole.    [provider]  citalopram  (CELEXA ) 20 MG tablet Take 1 tablet (20 mg total) by mouth at bedtime. 10/15/18   Yoo, Elsia J, DO  docusate sodium (COLACE) 100 MG capsule TAKE 1 CAPSULE BY MOUTH IN THE MORNING Patient taking differently: Take 100 mg by mouth daily. 01/08/19   Ladell Lenis, DO  Fluticasone -Umeclidin-Vilant (TRELEGY ELLIPTA ) 100-62.5-25 MCG/ACT AEPB Inhale 1 puff into the lungs daily. 02/06/23   Sood, Vineet, MD  gabapentin  (NEURONTIN ) 300 MG capsule TAKE 1 CAPSULE BY MOUTH IN THE MORNING TAKE 2 CAPSULE IN THE EVENING Patient taking differently: Take 300 mg by mouth 3 (three) times daily. TAKE 1 CAPSULE BY MOUTH IN THE MORNING TAKE 2 CAPSULE IN THE EVENING 05/01/18   Ladell Lenis, DO  levothyroxine  (SYNTHROID ) 50 MCG tablet Take 50 mcg by mouth daily before breakfast.    [provider]  losartan  (COZAAR ) 50 MG tablet Take 0.5 tablets (25 mg total) by mouth at bedtime. Patient taking differently: Take 50 mg by mouth 2 (two) times daily. 01/07/22   Pearlean Manus, MD  mometasone  (NASONEX ) 50 MCG/ACT nasal spray Place 2 sprays into the nose daily. 08/14/21   Sood, Vineet, MD   montelukast  (SINGULAIR ) 10 MG tablet Take 1 tablet (10 mg total) by mouth at bedtime. 04/06/20   Sood, Vineet, MD  multivitamin (ONE-A-DAY MEN'S) TABS tablet Take 1 tablet by mouth every morning. 10/15/18   Yoo, Elsia J, DO  nystatin-triamcinolone (MYCOLOG II) cream Apply 1 Application topically daily as needed. 01/02/22   [provider]  pantoprazole  (PROTONIX ) 40 MG tablet Take 40 mg by mouth 2 (two) times daily.    [provider]  pravastatin  (PRAVACHOL ) 20 MG tablet Take 20 mg by mouth daily. 05/11/21   [provider]  tamsulosin (FLOMAX) 0.4 MG CAPS capsule Take 0.4 mg by mouth at bedtime.    [provider]    Allergies: Amoxicillin -pot clavulanate, Ibuprofen , Penicillins, Ciprofloxacin, and Codeine    Review of Systems  Updated Vital Signs BP (!) 182/62   Pulse (!) 57   Temp 97.8 F (36.6 C) (Oral)   Resp (!) 23   Ht 5' 8 (1.727 m)   Wt 102.1 kg   SpO2 93%   BMI 34.21 kg/m   Physical Exam Vitals and nursing note reviewed.  Constitutional:      General: He is not in acute distress.    Appearance: He is well-developed.  HENT:     Head: Normocephalic and atraumatic.     Mouth/Throat:     Mouth: Mucous membranes are moist.  Eyes:     General: Vision grossly intact. Gaze  aligned appropriately.     Extraocular Movements: Extraocular movements intact.     Conjunctiva/sclera: Conjunctivae normal.  Cardiovascular:     Rate and Rhythm: Normal rate and regular rhythm.     Pulses: Normal pulses.     Heart sounds: Normal heart sounds, S1 normal and S2 normal. No murmur heard.    No friction rub. No gallop.  Pulmonary:     Effort: Pulmonary effort is normal. No respiratory distress.     Breath sounds: Normal breath sounds.  Abdominal:     Palpations: Abdomen is soft.     Tenderness: There is no abdominal tenderness. There is no guarding or rebound.     Hernia: No hernia is present.  Musculoskeletal:        General: No swelling.      Cervical back: Full passive range of motion without pain, normal range of motion and neck supple. No pain with movement, spinous process tenderness or muscular tenderness. Normal range of motion.     Right lower leg: No edema.     Left lower leg: No edema.  Skin:    General: Skin is warm and dry.     Capillary Refill: Capillary refill takes less than 2 seconds.     Findings: No ecchymosis, erythema, lesion or wound.  Neurological:     Mental Status: He is alert and oriented to person, place, and time.     GCS: GCS eye subscore is 4. GCS verbal subscore is 5. GCS motor subscore is 6.     Cranial Nerves: Cranial nerves 2-12 are intact.     Sensory: Sensation is intact.     Motor: Motor function is intact. No weakness or abnormal muscle tone.     Coordination: Coordination is intact.  Psychiatric:        Mood and Affect: Mood normal.        Speech: Speech normal.        Behavior: Behavior normal.     (all labs ordered are listed, but only abnormal results are displayed) Labs Reviewed  BASIC METABOLIC PANEL WITH GFR - Abnormal; Notable for the following components:      Result Value   Sodium 134 (*)    Chloride 94 (*)    Glucose, Bld 110 (*)    All other components within normal limits  URINALYSIS, W/ REFLEX TO CULTURE (INFECTION SUSPECTED)  CBC WITH DIFFERENTIAL/PLATELET    EKG: None  Radiology: CT RENAL STONE STUDY Result Date: 02/13/2024 EXAM: CT ABDOMEN AND PELVIS WITHOUT CONTRAST 02/13/2024 01:15:04 AM TECHNIQUE: CT of the abdomen and pelvis was performed without the administration of intravenous contrast. Multiplanar reformatted images are provided for review. Automated exposure control, iterative reconstruction, and/or weight based adjustment of the mA/kV was utilized to reduce the radiation dose to as low as reasonably achievable. COMPARISON: 07/13/2021 CLINICAL HISTORY: Abdominal/flank pain, stone suspected. Pt states he has not been able to urinate x 3 hrs. States he just  dribbles. Also c/o R sided flank pain. FINDINGS: LOWER CHEST: Moderate hiatal hernia. LIVER: The liver is unremarkable. GALLBLADDER AND BILE DUCTS: Gallbladder is unremarkable. No biliary ductal dilatation. SPLEEN: Calcified splenic granulomata. PANCREAS: No acute abnormality. ADRENAL GLANDS: No acute abnormality. KIDNEYS, URETERS AND BLADDER: No stones in the kidneys or ureters. No hydronephrosis. No perinephric or periureteral stranding. Urinary bladder is unremarkable. GI AND BOWEL: Appendix is not discretely visualized. There is no bowel obstruction. No bowel wall thickening. PERITONEUM AND RETROPERITONEUM: No ascites. No free air. VASCULATURE: Atherosclerotic calcifications of  the abdominal aorta and branch vessels. LYMPH NODES: No lymphadenopathy. REPRODUCTIVE ORGANS: Prostate is unremarkable. BONES AND SOFT TISSUES: Degenerative changes of the visualized thoracolumbar spine. Mild fat in the right inguinal canal. IMPRESSION: 1. No acute findings in the abdomen or pelvis. Electronically signed by: Pinkie Pebbles MD 02/13/2024 01:26 AM EDT RP Workstation: [id number]     Procedures   Medications Ordered in the ED  cephALEXin (KEFLEX) capsule 500 mg (has no administration in time range)                                    Medical Decision Making Amount and/or Complexity of Data Reviewed Labs: ordered. Radiology: ordered.   Patient reports that he has been having difficulty passing his urine since 3 PM today.  He was able to spontaneously void approximately 200 mL of urine here in the ED.  He does not appear to be in any distress.  He does describe some discomfort into the right flank area.  Acute bladder outlet obstruction; UTI; pyelonephritis; renal colic all considered as a possible differential diagnosis.  Patient's blood work unremarkable.  Normal renal function.  No significant leukocytosis.  Vital signs are unremarkable.  Urinalysis does not suggest obvious infection.  CT scan does  not show evidence of bladder outlet obstruction or ureterolithiasis.  Reviewing his records reveals he is already on Flomax.  Will add empiric antibiotic coverage, follow-up with urology.     Final diagnoses:  Urinary hesitancy    ED Discharge Orders          Ordered    cephALEXin (KEFLEX) 500 MG capsule  3 times daily        02/13/24 0157               Haze Lonni PARAS, MD 02/13/24 [phone]

## 2024-02-13 NOTE — ED Triage Notes (Signed)
 Pt states he has not been able to urinate x 3 hrs. States he just dribbles. Also c/o R sided flank pain.

## 2024-02-18 ENCOUNTER — Telehealth (HOSPITAL_BASED_OUTPATIENT_CLINIC_OR_DEPARTMENT_OTHER): Payer: Self-pay

## 2024-02-18 NOTE — Telephone Encounter (Signed)
 CMN received for oxygen  signed by provider and faxed confirmation received

## 2024-03-03 ENCOUNTER — Encounter (INDEPENDENT_AMBULATORY_CARE_PROVIDER_SITE_OTHER): Payer: Self-pay | Admitting: *Deleted

## 2024-06-04 ENCOUNTER — Ambulatory Visit: Admitting: Nurse Practitioner

## 2024-06-05 ENCOUNTER — Emergency Department (HOSPITAL_COMMUNITY)
Admission: EM | Admit: 2024-06-05 | Discharge: 2024-06-05 | Discharge status: Against Medical Advice | Attending: Emergency Medicine | Admitting: Emergency Medicine

## 2024-06-05 DIAGNOSIS — W11XXXA Fall on and from ladder, initial encounter: Secondary | ICD-10-CM | POA: Diagnosis not present

## 2024-06-05 DIAGNOSIS — Z5321 Procedure and treatment not carried out due to patient leaving prior to being seen by health care provider: Secondary | ICD-10-CM | POA: Insufficient documentation

## 2024-06-05 DIAGNOSIS — R079 Chest pain, unspecified: Secondary | ICD-10-CM | POA: Diagnosis present

## 2024-06-05 DIAGNOSIS — M546 Pain in thoracic spine: Secondary | ICD-10-CM | POA: Diagnosis not present

## 2024-06-05 NOTE — ED Notes (Signed)
 Pt called nurse into to say that he wanted to leave. Pt was notified that a provider had sign up for his case, but hadn't came to assess him yet. Pt said he just wanted to leave because he had been waiting too long. Pt AAOx4, ambulatory. MSE waiver Signed

## 2024-06-05 NOTE — ED Triage Notes (Signed)
 Pt comes in for a fall from last Tuesday. Pt fell off a stp ladder and landed on his back to the  wood floor. Pain in between shoulder blades and center of his chest. Pain has ben there since the fall and has stayed the same. A&Ox4.  **hard of hearing

## 2024-06-07 ENCOUNTER — Encounter: Payer: Self-pay | Admitting: Nurse Practitioner

## 2024-06-07 ENCOUNTER — Ambulatory Visit: Attending: Nurse Practitioner | Admitting: Nurse Practitioner

## 2024-06-07 VITALS — BP 152/60 | HR 52 | Ht 68.0 in | Wt 227.8 lb

## 2024-06-07 DIAGNOSIS — E785 Hyperlipidemia, unspecified: Secondary | ICD-10-CM | POA: Diagnosis present

## 2024-06-07 DIAGNOSIS — I350 Nonrheumatic aortic (valve) stenosis: Secondary | ICD-10-CM | POA: Insufficient documentation

## 2024-06-07 DIAGNOSIS — I2510 Atherosclerotic heart disease of native coronary artery without angina pectoris: Secondary | ICD-10-CM | POA: Diagnosis present

## 2024-06-07 DIAGNOSIS — I10 Essential (primary) hypertension: Secondary | ICD-10-CM | POA: Insufficient documentation

## 2024-06-07 DIAGNOSIS — G4733 Obstructive sleep apnea (adult) (pediatric): Secondary | ICD-10-CM | POA: Diagnosis present

## 2024-06-07 DIAGNOSIS — R0789 Other chest pain: Secondary | ICD-10-CM | POA: Diagnosis present

## 2024-06-07 DIAGNOSIS — I77810 Thoracic aortic ectasia: Secondary | ICD-10-CM | POA: Diagnosis present

## 2024-06-07 NOTE — Progress Notes (Unsigned)
 Cardiology Office Note:  .   Date:  11/28/2023 ID:  Cristian Yang, DOB Sep 29, 1934, MRN [id number] PCP: Suanne Pfeiffer, NP  [facility] Providers Cardiologist:  Vinie JAYSON Maxcy, MD    History of Present Illness: .   Cristian Yang is a 89 y.o. male with a PMH of nonobstructive CAD, aortic valve stenosis, hypertension, hyperlipidemia, hypothyroidism, OSA on CPAP, COPD, asthma, GERD, who presents today for overdue follow-up.  Last seen by Prentice Medley, NP on February 12, 2021. Was overall doing well at the time.   Today he presents for overdue follow-up.  He states he is doing very well.  Since he was last seen in our office, he tells me he has had several cancer spots removed on his skin.  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  Wife passed away few years ago, however he is doing very well.  Continues to remain very active. Denies any chest pain, shortness of breath, palpitations, syncope, presyncope, dizziness, orthopnea, PND, swelling or significant weight changes, acute bleeding, or claudication.   ROS: Negative. See HPI.   Studies Reviewed: SABRA    EKG:     Echo 12/2021:   1. Left ventricular ejection fraction, by estimation, is 60 to 65%. The  left ventricle has normal function. The left ventricle has no regional  wall motion abnormalities. Left ventricular diastolic parameters are  consistent with Grade I diastolic  dysfunction (impaired relaxation).   2. Right ventricular systolic function is hyperdynamic. The right  ventricular size is normal. There is normal pulmonary artery systolic  pressure. The estimated right ventricular systolic pressure is 30.1 mmHg.   3. Left atrial size was moderately dilated.   4. Right atrial size was moderately dilated.   5. The mitral valve is grossly normal. No evidence of mitral valve  regurgitation.   6. The aortic valve is tricuspid. Aortic valve regurgitation is not  visualized. Mild aortic  valve stenosis. Aortic valve area, by VTI measures  1.74 cm. Aortic valve mean gradient measures 13.0 mmHg. Aortic valve Vmax  measures 2.30 m/s. DI is 0.50.   7. Aortic dilatation noted. There is borderline dilatation of the aortic  root, measuring 39 mm.   Comparison(s): Changes from prior study are noted. 08/29/2020: LVEF 60-65%, mild AS.  Cardiac cath 04/2013:  Impression: 1.  Mild nonocclusive coronary artery disease in a right dominant system. 2.  Normal left ventricular function with ejection fraction of 65%. 3.  Moderate systemic pressures with mildly elevated left ventricular end-diastolic pressure.  Plan: Continue with aggressive secondary CAD preventative measures.   Physical Exam:   VS:  BP (!) 160/82   Pulse (!) 52   Ht 5' 8 (1.727 m)   Wt 227 lb 12.8 oz (103.3 kg)   SpO2 93%   BMI 34.64 kg/m    Wt Readings from Last 3 Encounters:  06/07/24 227 lb 12.8 oz (103.3 kg)  06/05/24 225 lb (102.1 kg)  02/13/24 225 lb (102.1 kg)    GEN: Well nourished, well developed in no acute distress NECK: No JVD; No carotid bruits CARDIAC: S1/S2, RRR, Grade 1/6 murmur, no rubs, no gallops RESPIRATORY:  Clear to auscultation without rales, wheezing or rhonchi  ABDOMEN: Soft, non-tender, non-distended EXTREMITIES:  No edema; No deformity   ASSESSMENT AND PLAN: .    Aortic valve stenosis, aortic root dilatation Echo in 2023 showed mild aortic valve stenosis with mean gradient 13.0 mmHg.  Also  revealed borderline dilatation of aortic root, measuring 39 mm.  Denies any concerning symptoms.  Did discuss updating echocardiogram/regular surveillance.  Patient declines.  Came to shared medical decision with patient we will d/c regular surveillance at this time due to his advanced age. No medication changes at this time. Care and ED precautions discussed.   Nonobstructive CAD Stable with no anginal symptoms. No indication for ischemic evaluation. Continue Aspirin , losartan , and pravastatin .  Heart healthy diet and regular cardiovascular exercise encouraged. Care and ED precautions discussed.   Hypertension BP is well controlled. Discussed to monitor BP at home at least 2 hours after medications and sitting for 5-10 minutes.  Continue current medication regimen. Heart healthy diet and regular cardiovascular exercise encouraged. Continue to follow with PCP.  Hyperlipidemia LDL March 2025 was 56.  Continue pravastatin . Heart healthy diet and regular cardiovascular exercise encouraged.   5.  OSA on CPAP Encouraged continued compliance.   Dispo: Follow-up with Dr. Mona or APP in 6 months or sooner if any changes.  Signed, Almarie Crate, NP

## 2024-06-07 NOTE — Patient Instructions (Addendum)
 Medication Instructions:   Continue all current medications.   Labwork:  none  Testing/Procedures:  none  Follow-Up:  6 months   Any Other Special Instructions Will Be Listed Below (If Applicable).  BP log  Salty six  Nurse visit in 1-2 week for BP check  If you need a refill on your cardiac medications before your next appointment, please call your pharmacy.'

## 2024-06-11 ENCOUNTER — Ambulatory Visit: Admitting: Nurse Practitioner

## 2024-06-22 ENCOUNTER — Ambulatory Visit: Attending: Cardiology

## 2024-06-22 VITALS — BP 148/78 | HR 61

## 2024-06-22 DIAGNOSIS — I10 Essential (primary) hypertension: Secondary | ICD-10-CM | POA: Diagnosis present

## 2024-06-22 NOTE — Progress Notes (Signed)
 Patient here for BP check. Reports no Chest pain, lightheadedness or dizziness or any SOB. Has taken medications. Stated that he has been feeling fine. He checks his BP at home daily on his right arm. He stated that he saw his PCP about 3 months ago she made some changes to his BP medication due to it was too low. He left his log at home but stated it numbers were running any where from 160-170s. He goes back to see her on 12/23 he said she may change the medications back due to it running higher now. Checked his BP after he rested about 10 minutes. Left 152/80 and right 134/78. Checked again in left arm after a few minutes 148/78. Advised him will route to [facility] for further review

## 2024-06-30 ENCOUNTER — Encounter: Payer: Self-pay | Admitting: Internal Medicine

## 2024-06-30 ENCOUNTER — Telehealth: Payer: Self-pay

## 2024-06-30 ENCOUNTER — Telehealth: Payer: Self-pay | Admitting: Internal Medicine

## 2024-06-30 NOTE — Telephone Encounter (Signed)
 Spoke with patient regarding nurse visit and he sees his PCP on 12/23. Patient verbalized understanding

## 2024-06-30 NOTE — Telephone Encounter (Signed)
-----   Message from Almarie Crate sent at 06/29/2024  1:37 PM EST ----- BP okay. It appears PCP is managing this - will defer to his PCP.   Thanks!  Best, Almarie Crate, NP ----- Message ----- From: Johnnye Littie HERO, CMA Sent: 06/22/2024   2:24 PM EST To: Almarie Crate, NP

## 2024-06-30 NOTE — Telephone Encounter (Signed)
 LVM for patient concerning appointment date and time change--original appointment Monday 08/09/2024 at 10:00 am with Dr. Darlean ---new appointment Thursday 07/29/2024 at 11:30 am with Dr. Catherine.  Will mail new information to patient and requested return call with questions or concerns.  Will also mail letter to patient

## 2024-07-28 NOTE — Progress Notes (Signed)
 "  Established Patient Pulmonology Office Visit   Subjective:  Patient ID: Cristian Yang, male    DOB: 1935-04-16  MRN: [id number]  CC:  Chief Complaint  Patient presents with   Sleep Apnea    Could be having pressure issues  Copd doe - 2 l o2 hs     HPI  Cristian Yang is an 89 y/o man with hx of mild OSA and GOLD 2 COPD who presents for follow up.  Discussed the use of AI scribe software for clinical note transcription with the patient, who gave verbal consent to proceed.  History of Present Illness Cristian Yang is an 89 year old male with sleep apnea and COPD who presents with issues related to his CPAP machine and medication management.  He has been experiencing issues with his CPAP machine, which he has used for approximately five years. The machine is not providing readings, and he is unsure of the company that provided it, although he mentions Cristian Yang. He has been receiving equipment like face masks and hoses from the same company for about fifteen years. Recently, due to Medicare changes, he received a bill for the equipment and was advised to contact Social Security to obtain Part E coverage. He uses the CPAP machine every night, along with a two-liter oxygen  bleed, and spends the entire night on the machine.  He has a history of double pneumonia five to six years ago, which required hospitalization for eleven days. Since then, he has been on oxygen  therapy at night. He uses a Breztri inhaler, two puffs in the morning and evening, and has a rescue inhaler for shortness of breath, which he uses infrequently. No frequent bronchitis flare-ups and no hospitalizations for breathing problems since the pneumonia. He occasionally experiences wheezing, which is managed with a nebulizer once or twice a day.  His social history includes a past of heavy smoking, having quit over twenty years ago. He smoked two packs a day while working for the [employer] for thirty years, often in  environments with diesel dust and salt dust. 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  He reports occasional wheezing and uses a nebulizer once a day, increasing to twice if coughing. No chest congestion, green or yellow phlegm, and he has not needed antibiotics frequently. He has arthritis and wears a brace due to loss of arch in his foot.  His medication regimen includes Breztri inhaler, which he finds easier to use than Trelegy, and he rinses his mouth after use. He also uses a nebulizer solution, Duranib, once or twice a day as needed.  ROS   Current Medications[1]      Objective:  BP (!) 101/58   Pulse 67   Ht 5' 8 (1.727 m)   Wt 219 lb (99.3 kg)   SpO2 91% Comment: ra  BMI 33.30 kg/m  Wt Readings from Last 3 Encounters:  07/29/24 219 lb (99.3 kg)  06/07/24 227 lb 12.8 oz (103.3 kg)  06/05/24 225 lb (102.1 kg)   BMI Readings from Last 3 Encounters:  07/29/24 33.30 kg/m  06/07/24 34.64 kg/m  06/05/24 34.21 kg/m   SpO2 Readings from Last 3 Encounters:  07/29/24 91%  06/22/24 94%  06/07/24 93%    Physical Exam General: NAD, alert, WD, WN Eyes: PERRL, no scleral icterus ENMT: oropharynx clear, good dentition, no oral lesions, mallampati score IV Skin: warm, intact, no rashes  Neck: neck circ > 16 inches CV: RRR, systolic murmurs, nl S1 and S2, no peripheral edema Resp: scattered wheezes, no clubbing Neuro: Awake alert oriented to person place time and situation   Diagnostic Review:  Last CBC Lab Results  Component Value Date   WBC 9.6 02/13/2024   HGB 11.7 (L) 02/13/2024   HCT 34.5 (L) 02/13/2024   MCV 94.5 02/13/2024   MCH 32.1 02/13/2024   RDW 13.7 02/13/2024   PLT 197 02/13/2024   Last metabolic panel Lab Results  Component Value Date   GLUCOSE 110 (H) 02/13/2024   NA 134 (L) 02/13/2024   K 4.2 02/13/2024   CL 94 (L) 02/13/2024   CO2 27 02/13/2024   BUN 13 02/13/2024    CREATININE 0.94 02/13/2024   GFRNONAA >60 02/13/2024   CALCIUM 9.1 02/13/2024   PHOS 3.7 01/05/2022   PROT 6.0 (L) 01/05/2022   ALBUMIN 3.1 (L) 01/05/2022   LABGLOB 2.2 10/21/2017   AGRATIO 1.9 10/21/2017   BILITOT 0.3 01/05/2022   ALKPHOS 49 01/05/2022   AST 21 01/05/2022   ALT 21 01/05/2022   ANIONGAP 13 02/13/2024    PFT 12/12/14 >> FEV1 1.61 (60%), FEV1% 66, TLC 5.48 (82%), DLCO 47%, no BD; moderate obstructive defect with severe reduction in gas transfer   CT angio chest 08/30/19 >> hiatal hernia, patchy and tree in bud nodularity in LUL, b/l lower lobe consolidation CT angio chest 07/13/21 >> atherosclerosis, small hiatal hernia CT angio chest 01/04/22 >> RLL PE, upper lobe emphysema, areas of tree in bud CT angio chest 09/06/22 >> mod HH, no PE CT angio chest 09/2023: no PE, mild cardiomegaly, small pleural effusions, mild emphysema and chronic bronchitis   PSG 09/29/02 >> AHI 12, SaO2 low 85%   Echo 01/05/22 >> EF 60 to 65%, grade 1 DD, RVSP 30.1 mmHg, mod LA/RA dilation, mild AS, aortic root 39 mm    Assessment & Plan:   Assessment & Plan Chronic obstructive pulmonary disease, unspecified COPD type (HCC) Chronic obstructive pulmonary disease COPD stable with no recent exacerbations. Managed with Breztri and rescue inhaler. History of heavy smoking, quit over 20 years ago. No recent prednisone  or antibiotics needed. MMRC > 2, CAT > 10. Class B, Grade II. - Continue Breztri, two puffs in the morning and two puffs in the evening. - Use rescue inhaler as needed for shortness of breath. - Use nebulizer solution once daily, increase to twice daily if symptomatic. - Refilled Breztri prescription at [facility] - Ordered Duonebs solution for nebulizer at [facility]. OSA (obstructive sleep apnea) Mild Obstructive sleep apnea Managed with CPAP therapy. Current machine over five years old, replacement needed. Uses CPAP with two-liter oxygen  bleed at night.  Equipment supply issues due to changes. - Ordered new CPAP machine. - Continue using current CPAP machine until new one arrives. - Advised weight loss and discussed appropriate sleep hygiene.  Orders Placed This Encounter  Procedures   AMB REFERRAL FOR DME   I spent 32 minutes reviewing patient's chart including prior consultant notes, imaging, and PFTs as well as face-to-face with the patient, over half in discussion of the diagnosis and the importance of compliance with the treatment plan.  Return in about 4 months (around 11/26/2024).   Verlin Duke, MD     [1]  Current Outpatient Medications:    albuterol  (PROVENTIL ) (2.5 MG/3ML) 0.083% nebulizer solution, Take 2.5 mg by nebulization every 6 (six) hours as needed for wheezing or shortness of breath., Disp: ,  Rfl:    albuterol  (VENTOLIN  HFA) 108 (90 Base) MCG/ACT inhaler, Inhale 1-2 puffs into the lungs every 6 (six) hours as needed., Disp: , Rfl:    citalopram  (CELEXA ) 20 MG tablet, Take 1 tablet (20 mg total) by mouth at bedtime., Disp: 30 tablet, Rfl: 3   docusate sodium (COLACE) 100 MG capsule, TAKE 1 CAPSULE BY MOUTH IN THE MORNING (Patient taking differently: Take 100 mg by mouth daily.), Disp: 28 capsule, Rfl: 0   gabapentin  (NEURONTIN ) 300 MG capsule, TAKE 1 CAPSULE BY MOUTH IN THE MORNING TAKE 2 CAPSULE IN THE EVENING (Patient taking differently: Take 300 mg by mouth 3 (three) times daily. TAKE 1 CAPSULE BY MOUTH IN THE MORNING TAKE 2 CAPSULE IN THE EVENING), Disp: 84 capsule, Rfl: 0   HYDROcodone -acetaminophen  (NORCO) 10-325 MG tablet, Take 1 tablet by mouth every 8 (eight) hours as needed. for pain, Disp: , Rfl:    ipratropium-albuterol  (DUONEB) 0.5-2.5 (3) MG/3ML SOLN, Take 3 mLs by nebulization every 4 (four) hours as needed., Disp: 360 mL, Rfl: 6   levothyroxine  (SYNTHROID ) 50 MCG tablet, Take 50 mcg by mouth daily before breakfast., Disp: , Rfl:    losartan  (COZAAR ) 50 MG tablet, Take 0.5 tablets (25 mg total) by mouth at  bedtime. (Patient taking differently: Take 50 mg by mouth 2 (two) times daily.), Disp: 30 tablet, Rfl: 2   meloxicam (MOBIC) 15 MG tablet, Take 15 mg by mouth daily., Disp: , Rfl:    mometasone  (NASONEX ) 50 MCG/ACT nasal spray, Place 2 sprays into the nose daily., Disp: 51 g, Rfl: 3   montelukast  (SINGULAIR ) 10 MG tablet, Take 1 tablet (10 mg total) by mouth at bedtime., Disp: 30 tablet, Rfl: 3   multivitamin (ONE-A-DAY MEN'S) TABS tablet, Take 1 tablet by mouth every morning., Disp: 30 tablet, Rfl: 11   pantoprazole  (PROTONIX ) 40 MG tablet, Take 40 mg by mouth 2 (two) times daily., Disp: , Rfl:    pravastatin  (PRAVACHOL ) 20 MG tablet, Take 20 mg by mouth daily., Disp: , Rfl:    tamsulosin (FLOMAX) 0.4 MG CAPS capsule, Take 0.4 mg by mouth at bedtime., Disp: , Rfl:    aspirin  EC 81 MG tablet, Take 81 mg by mouth daily. Swallow whole. (Patient not taking: Reported on 07/29/2024), Disp: , Rfl:    budesonide -glycopyrrolate-formoterol  (BREZTRI) 160-9-4.8 MCG/ACT AERO inhaler, Inhale 2 puffs into the lungs 2 (two) times daily., Disp: 10.7 each, Rfl: 6   nystatin-triamcinolone (MYCOLOG II) cream, Apply 1 Application topically daily as needed. (Patient not taking: Reported on 07/29/2024), Disp: , Rfl:   "

## 2024-07-29 ENCOUNTER — Encounter: Payer: Self-pay | Admitting: Pulmonary Disease

## 2024-07-29 ENCOUNTER — Ambulatory Visit (INDEPENDENT_AMBULATORY_CARE_PROVIDER_SITE_OTHER): Admitting: Pulmonary Disease

## 2024-07-29 VITALS — BP 101/58 | HR 67 | Ht 68.0 in | Wt 219.0 lb

## 2024-07-29 DIAGNOSIS — G4733 Obstructive sleep apnea (adult) (pediatric): Secondary | ICD-10-CM | POA: Diagnosis not present

## 2024-07-29 DIAGNOSIS — J449 Chronic obstructive pulmonary disease, unspecified: Secondary | ICD-10-CM | POA: Diagnosis not present

## 2024-07-29 DIAGNOSIS — Z87891 Personal history of nicotine dependence: Secondary | ICD-10-CM

## 2024-07-29 MED ORDER — IPRATROPIUM-ALBUTEROL 0.5-2.5 (3) MG/3ML IN SOLN: 3.0000 mL | RESPIRATORY_TRACT | 6 refills | Status: AC | PRN

## 2024-07-29 MED ORDER — BUDESON-GLYCOPYRROL-FORMOTEROL 160-9-4.8 MCG/ACT IN AERO: 2.0000 | INHALATION_SPRAY | Freq: Two times a day (BID) | RESPIRATORY_TRACT | 6 refills | Status: AC

## 2024-07-29 NOTE — Patient Instructions (Addendum)
" °  VISIT SUMMARY: Today, we addressed your COPD and sleep apnea management. We discussed issues with your CPAP machine and medication regimen.  YOUR PLAN: CHRONIC OBSTRUCTIVE PULMONARY DISEASE (COPD): Your COPD is stable with no recent flare-ups. You are currently using Breztri and a rescue inhaler. -Continue using Breztri, two puffs in the morning and two puffs in the evening. -Use your rescue inhaler as needed for shortness of breath. -Use your nebulizer solution once daily, and increase to twice daily if you have symptoms. -Your Breztri prescription has been refilled at [facility]. -A new order for Duranib solution for your nebulizer has been placed at [facility].  OBSTRUCTIVE SLEEP APNEA: You have been using a CPAP machine for over five years, and it needs replacement. You use the CPAP with a two-liter oxygen  bleed at night. -A new CPAP machine has been ordered for you. -Continue using your current CPAP machine until the new one arrives. -Contact Social Security to obtain Part E coverage to help with equipment costs.  Contains text generated by [employer].  "

## 2024-07-29 NOTE — Assessment & Plan Note (Signed)
 Chronic obstructive pulmonary disease COPD stable with no recent exacerbations. Managed with Breztri and rescue inhaler. History of heavy smoking, quit over 20 years ago. No recent prednisone  or antibiotics needed. MMRC > 2, CAT > 10. Class B, Grade II. - Continue Breztri, two puffs in the morning and two puffs in the evening. - Use rescue inhaler as needed for shortness of breath. - Use nebulizer solution once daily, increase to twice daily if symptomatic. - Refilled Breztri prescription at [facility]. - Ordered Duonebs solution for nebulizer at [facility].

## 2024-08-09 ENCOUNTER — Encounter: Admitting: Internal Medicine

## 2024-08-11 ENCOUNTER — Other Ambulatory Visit (HOSPITAL_COMMUNITY): Payer: Self-pay | Admitting: Family Medicine

## 2024-08-11 ENCOUNTER — Ambulatory Visit (HOSPITAL_COMMUNITY)
Admission: RE | Admit: 2024-08-11 | Discharge: 2024-08-11 | Disposition: A | Discharge status: Home / Self Care | Source: Ambulatory Visit | Attending: Family Medicine | Admitting: Family Medicine

## 2024-08-11 DIAGNOSIS — G8929 Other chronic pain: Secondary | ICD-10-CM | POA: Diagnosis present

## 2024-08-11 DIAGNOSIS — M25511 Pain in right shoulder: Secondary | ICD-10-CM | POA: Insufficient documentation
